# Patient Record
Sex: Male | Born: 1935
Health system: Southern US, Community
[De-identification: ages and names within clinical notes are randomized; demographics above are authoritative.]

## PROBLEM LIST (undated history)

## (undated) DIAGNOSIS — K5641 Fecal impaction: Secondary | ICD-10-CM

## (undated) DIAGNOSIS — I10 Essential (primary) hypertension: Secondary | ICD-10-CM

## (undated) DIAGNOSIS — N400 Enlarged prostate without lower urinary tract symptoms: Secondary | ICD-10-CM

## (undated) DIAGNOSIS — M1990 Unspecified osteoarthritis, unspecified site: Secondary | ICD-10-CM

## (undated) DIAGNOSIS — E039 Hypothyroidism, unspecified: Secondary | ICD-10-CM

## (undated) DIAGNOSIS — K219 Gastro-esophageal reflux disease without esophagitis: Secondary | ICD-10-CM

## (undated) DIAGNOSIS — A09 Infectious gastroenteritis and colitis, unspecified: Secondary | ICD-10-CM

## (undated) DIAGNOSIS — R42 Dizziness and giddiness: Secondary | ICD-10-CM

## (undated) DIAGNOSIS — I951 Orthostatic hypotension: Secondary | ICD-10-CM

## (undated) DIAGNOSIS — M79606 Pain in leg, unspecified: Secondary | ICD-10-CM

## (undated) DIAGNOSIS — I498 Other specified cardiac arrhythmias: Secondary | ICD-10-CM

## (undated) DIAGNOSIS — D649 Anemia, unspecified: Secondary | ICD-10-CM

## (undated) DIAGNOSIS — E559 Vitamin D deficiency, unspecified: Secondary | ICD-10-CM

## (undated) DIAGNOSIS — R05 Cough: Principal | ICD-10-CM

## (undated) DIAGNOSIS — M545 Low back pain: Secondary | ICD-10-CM

## (undated) DIAGNOSIS — M2550 Pain in unspecified joint: Secondary | ICD-10-CM

## (undated) DIAGNOSIS — E785 Hyperlipidemia, unspecified: Secondary | ICD-10-CM

## (undated) DIAGNOSIS — IMO0002 Reserved for concepts with insufficient information to code with codable children: Secondary | ICD-10-CM

## (undated) DIAGNOSIS — F411 Generalized anxiety disorder: Secondary | ICD-10-CM

## (undated) DIAGNOSIS — K279 Peptic ulcer, site unspecified, unspecified as acute or chronic, without hemorrhage or perforation: Secondary | ICD-10-CM

## (undated) DIAGNOSIS — M7500 Adhesive capsulitis of unspecified shoulder: Secondary | ICD-10-CM

## (undated) DIAGNOSIS — H9190 Unspecified hearing loss, unspecified ear: Secondary | ICD-10-CM

## (undated) DIAGNOSIS — R609 Edema, unspecified: Secondary | ICD-10-CM

## (undated) DIAGNOSIS — W19XXXA Unspecified fall, initial encounter: Secondary | ICD-10-CM

## (undated) DIAGNOSIS — R350 Frequency of micturition: Secondary | ICD-10-CM

## (undated) DIAGNOSIS — K449 Diaphragmatic hernia without obstruction or gangrene: Secondary | ICD-10-CM

## (undated) DIAGNOSIS — I509 Heart failure, unspecified: Secondary | ICD-10-CM

## (undated) DIAGNOSIS — E871 Hypo-osmolality and hyponatremia: Secondary | ICD-10-CM

## (undated) DIAGNOSIS — N179 Acute kidney failure, unspecified: Secondary | ICD-10-CM

## (undated) DIAGNOSIS — M719 Bursopathy, unspecified: Secondary | ICD-10-CM

## (undated) DIAGNOSIS — G4700 Insomnia, unspecified: Secondary | ICD-10-CM

## (undated) DIAGNOSIS — I714 Abdominal aortic aneurysm, without rupture, unspecified: Secondary | ICD-10-CM

## (undated) DIAGNOSIS — M25559 Pain in unspecified hip: Secondary | ICD-10-CM

## (undated) DIAGNOSIS — R221 Localized swelling, mass and lump, neck: Secondary | ICD-10-CM

## (undated) DIAGNOSIS — R269 Unspecified abnormalities of gait and mobility: Secondary | ICD-10-CM

## (undated) DIAGNOSIS — M67919 Unspecified disorder of synovium and tendon, unspecified shoulder: Secondary | ICD-10-CM

## (undated) DIAGNOSIS — R220 Localized swelling, mass and lump, head: Secondary | ICD-10-CM

## (undated) DIAGNOSIS — N289 Disorder of kidney and ureter, unspecified: Secondary | ICD-10-CM

## (undated) HISTORY — DX: Unspecified osteoarthritis, unspecified site: M19.90

## (undated) HISTORY — DX: Hypo-osmolality and hyponatremia: E87.1

## (undated) HISTORY — DX: Benign prostatic hyperplasia without lower urinary tract symptoms: N40.0

## (undated) HISTORY — DX: Unspecified hearing loss, unspecified ear: H91.90

## (undated) HISTORY — DX: Reserved for concepts with insufficient information to code with codable children: IMO0002

## (undated) HISTORY — DX: Bursopathy, unspecified: M71.9

## (undated) HISTORY — DX: Vitamin D deficiency, unspecified: E55.9

## (undated) HISTORY — DX: Heart failure, unspecified: I50.9

## (undated) HISTORY — DX: Essential (primary) hypertension: I10

## (undated) HISTORY — DX: Localized swelling, mass and lump, head: R22.0

## (undated) HISTORY — DX: Gastro-esophageal reflux disease without esophagitis: K21.9

## (undated) HISTORY — DX: Abdominal aortic aneurysm, without rupture, unspecified: I71.40

## (undated) HISTORY — DX: Unspecified abnormalities of gait and mobility: R26.9

## (undated) HISTORY — DX: Unspecified disorder of synovium and tendon, unspecified shoulder: M67.919

## (undated) HISTORY — DX: Orthostatic hypotension: I95.1

## (undated) HISTORY — DX: Infectious gastroenteritis and colitis, unspecified: A09

## (undated) HISTORY — DX: Low back pain: M54.5

## (undated) HISTORY — DX: Diaphragmatic hernia without obstruction or gangrene: K44.9

## (undated) HISTORY — DX: Cough: R05

## (undated) HISTORY — DX: Hypercalcemia: E83.52

## (undated) HISTORY — PX: CATARACT EXTRACTION: SUR2

## (undated) HISTORY — DX: Localized swelling, mass and lump, neck: R22.1

## (undated) HISTORY — DX: Insomnia, unspecified: G47.00

## (undated) HISTORY — DX: Disorder of kidney and ureter, unspecified: N28.9

## (undated) HISTORY — DX: Pain in unspecified hip: M25.559

## (undated) HISTORY — DX: Dizziness and giddiness: R42

## (undated) HISTORY — DX: Other specified cardiac arrhythmias: I49.8

## (undated) HISTORY — DX: Abdominal aortic aneurysm, without rupture: I71.4

## (undated) HISTORY — DX: Frequency of micturition: R35.0

## (undated) HISTORY — DX: Pain in unspecified joint: M25.50

## (undated) HISTORY — DX: Fecal impaction: K56.41

## (undated) HISTORY — DX: Hyperlipidemia, unspecified: E78.5

## (undated) HISTORY — DX: Pain in leg, unspecified: M79.606

## (undated) HISTORY — DX: Acute kidney failure, unspecified: N17.9

## (undated) HISTORY — DX: Unspecified fall, initial encounter: W19.XXXA

## (undated) HISTORY — DX: Peptic ulcer, site unspecified, unspecified as acute or chronic, without hemorrhage or perforation: K27.9

## (undated) HISTORY — DX: Edema, unspecified: R60.9

## (undated) HISTORY — DX: Adhesive capsulitis of unspecified shoulder: M75.00

## (undated) HISTORY — DX: Generalized anxiety disorder: F41.1

## (undated) HISTORY — DX: Anemia, unspecified: D64.9

## (undated) HISTORY — PX: APPENDECTOMY: SHX54

## (undated) HISTORY — DX: Hypothyroidism, unspecified: E03.9

---

## 1998-03-10 HISTORY — PX: HEMORRHOID SURGERY: SHX153

## 1998-04-08 ENCOUNTER — Encounter: Payer: Self-pay | Admitting: Emergency Medicine

## 1998-04-08 ENCOUNTER — Inpatient Hospital Stay (HOSPITAL_COMMUNITY): Admission: EM | Admit: 1998-04-08 | Discharge: 1998-04-10 | Payer: Self-pay | Admitting: Emergency Medicine

## 1998-04-09 ENCOUNTER — Encounter: Payer: Self-pay | Admitting: Cardiology

## 1998-10-04 ENCOUNTER — Emergency Department (HOSPITAL_COMMUNITY): Admission: EM | Admit: 1998-10-04 | Discharge: 1998-10-04 | Payer: Self-pay | Admitting: Emergency Medicine

## 1998-10-04 ENCOUNTER — Encounter: Payer: Self-pay | Admitting: Emergency Medicine

## 1999-07-15 ENCOUNTER — Ambulatory Visit (HOSPITAL_COMMUNITY): Admission: RE | Admit: 1999-07-15 | Discharge: 1999-07-15 | Payer: Self-pay

## 2000-05-07 ENCOUNTER — Encounter: Admission: RE | Admit: 2000-05-07 | Discharge: 2000-05-07 | Payer: Self-pay

## 2000-05-12 ENCOUNTER — Ambulatory Visit (HOSPITAL_BASED_OUTPATIENT_CLINIC_OR_DEPARTMENT_OTHER): Admission: RE | Admit: 2000-05-12 | Discharge: 2000-05-13 | Payer: Self-pay

## 2000-06-16 ENCOUNTER — Ambulatory Visit (HOSPITAL_COMMUNITY): Admission: RE | Admit: 2000-06-16 | Discharge: 2000-06-16 | Payer: Self-pay | Admitting: Internal Medicine

## 2003-03-11 HISTORY — PX: INGUINAL HERNIA REPAIR: SHX194

## 2003-07-03 ENCOUNTER — Emergency Department (HOSPITAL_COMMUNITY): Admission: EM | Admit: 2003-07-03 | Discharge: 2003-07-03 | Payer: Self-pay | Admitting: Emergency Medicine

## 2004-10-01 ENCOUNTER — Ambulatory Visit (HOSPITAL_COMMUNITY): Admission: RE | Admit: 2004-10-01 | Discharge: 2004-10-01 | Payer: Self-pay

## 2005-07-03 ENCOUNTER — Emergency Department (HOSPITAL_COMMUNITY): Admission: EM | Admit: 2005-07-03 | Discharge: 2005-07-03 | Payer: Self-pay | Admitting: Emergency Medicine

## 2005-08-21 ENCOUNTER — Emergency Department (HOSPITAL_COMMUNITY): Admission: EM | Admit: 2005-08-21 | Discharge: 2005-08-21 | Payer: Self-pay | Admitting: Emergency Medicine

## 2006-09-24 ENCOUNTER — Encounter: Payer: Self-pay | Admitting: Nurse Practitioner

## 2008-05-01 ENCOUNTER — Encounter: Payer: Self-pay | Admitting: Nurse Practitioner

## 2009-01-17 ENCOUNTER — Encounter: Payer: Self-pay | Admitting: Nurse Practitioner

## 2009-01-24 ENCOUNTER — Encounter: Payer: Self-pay | Admitting: Nurse Practitioner

## 2009-01-26 ENCOUNTER — Emergency Department (HOSPITAL_COMMUNITY): Admission: EM | Admit: 2009-01-26 | Discharge: 2009-01-26 | Payer: Self-pay | Admitting: Emergency Medicine

## 2009-01-29 ENCOUNTER — Telehealth (INDEPENDENT_AMBULATORY_CARE_PROVIDER_SITE_OTHER): Payer: Self-pay | Admitting: *Deleted

## 2009-01-30 ENCOUNTER — Telehealth (INDEPENDENT_AMBULATORY_CARE_PROVIDER_SITE_OTHER): Payer: Self-pay | Admitting: *Deleted

## 2009-01-30 ENCOUNTER — Ambulatory Visit: Payer: Self-pay | Admitting: Gastroenterology

## 2009-01-30 DIAGNOSIS — N183 Chronic kidney disease, stage 3 (moderate): Secondary | ICD-10-CM

## 2009-01-30 DIAGNOSIS — D509 Iron deficiency anemia, unspecified: Secondary | ICD-10-CM

## 2009-01-30 DIAGNOSIS — I10 Essential (primary) hypertension: Secondary | ICD-10-CM

## 2009-01-30 DIAGNOSIS — R42 Dizziness and giddiness: Secondary | ICD-10-CM

## 2009-01-30 DIAGNOSIS — E785 Hyperlipidemia, unspecified: Secondary | ICD-10-CM

## 2009-02-07 ENCOUNTER — Ambulatory Visit: Payer: Self-pay | Admitting: Gastroenterology

## 2009-02-07 LAB — CONVERTED CEMR LAB: UREASE: POSITIVE

## 2009-02-15 ENCOUNTER — Telehealth: Payer: Self-pay | Admitting: Gastroenterology

## 2009-02-16 ENCOUNTER — Encounter: Payer: Self-pay | Admitting: Gastroenterology

## 2009-02-16 ENCOUNTER — Telehealth: Payer: Self-pay | Admitting: Gastroenterology

## 2009-03-16 ENCOUNTER — Ambulatory Visit: Payer: Self-pay | Admitting: Gastroenterology

## 2009-03-16 DIAGNOSIS — D500 Iron deficiency anemia secondary to blood loss (chronic): Secondary | ICD-10-CM | POA: Insufficient documentation

## 2009-03-16 DIAGNOSIS — K219 Gastro-esophageal reflux disease without esophagitis: Secondary | ICD-10-CM

## 2009-03-16 DIAGNOSIS — K5730 Diverticulosis of large intestine without perforation or abscess without bleeding: Secondary | ICD-10-CM | POA: Insufficient documentation

## 2009-03-16 DIAGNOSIS — K259 Gastric ulcer, unspecified as acute or chronic, without hemorrhage or perforation: Secondary | ICD-10-CM | POA: Insufficient documentation

## 2009-03-16 LAB — CONVERTED CEMR LAB
AST: 19 units/L (ref 0–37)
Albumin: 3.4 g/dL — ABNORMAL LOW (ref 3.5–5.2)
BUN: 36 mg/dL — ABNORMAL HIGH (ref 6–23)
Basophils Relative: 0.6 % (ref 0.0–3.0)
Calcium: 8.6 mg/dL (ref 8.4–10.5)
Chloride: 110 meq/L (ref 96–112)
Eosinophils Relative: 6.2 % — ABNORMAL HIGH (ref 0.0–5.0)
Ferritin: 12.2 ng/mL — ABNORMAL LOW (ref 22.0–322.0)
Folate: >20 ng/mL
Hemoglobin: 7.9 g/dL — CL (ref 13.0–17.0)
Iron: 9 ug/dL — ABNORMAL LOW (ref 42–165)
Neutro Abs: 4.4 10*3/uL (ref 1.4–7.7)
Potassium: 5 meq/L (ref 3.5–5.1)
RBC: 4.08 M/uL — ABNORMAL LOW (ref 4.22–5.81)
Sodium: 140 meq/L (ref 135–145)
TSH: 4.16 microintl units/mL (ref 0.35–5.50)
Total Protein: 6.6 g/dL (ref 6.0–8.3)
Transferrin: 283.5 mg/dL (ref 212.0–360.0)
Vitamin B-12: 657 pg/mL (ref 211–911)
WBC: 7.9 10*3/uL (ref 4.5–10.5)

## 2009-03-21 ENCOUNTER — Encounter: Payer: Self-pay | Admitting: Gastroenterology

## 2009-04-19 ENCOUNTER — Ambulatory Visit: Payer: Self-pay | Admitting: Gastroenterology

## 2009-04-20 LAB — CONVERTED CEMR LAB
Basophils Relative: 0.7 % (ref 0.0–3.0)
Eosinophils Relative: 5.6 % — ABNORMAL HIGH (ref 0.0–5.0)
Ferritin: 20.9 ng/mL — ABNORMAL LOW (ref 22.0–322.0)
Folate: >20 ng/mL
HCT: 31.4 % — ABNORMAL LOW (ref 39.0–52.0)
Iron: 20 ug/dL — ABNORMAL LOW (ref 42–165)
Lymphs Abs: 1.5 10*3/uL (ref 0.7–4.0)
MCHC: 31.5 g/dL (ref 30.0–36.0)
MCV: 71.1 fL — ABNORMAL LOW (ref 78.0–100.0)
Monocytes Absolute: 1.1 10*3/uL — ABNORMAL HIGH (ref 0.1–1.0)
Neutro Abs: 3.6 10*3/uL (ref 1.4–7.7)
Platelets: 216 10*3/uL (ref 150.0–400.0)
Transferrin: 269.8 mg/dL (ref 212.0–360.0)
Vitamin B-12: 705 pg/mL (ref 211–911)

## 2009-05-17 ENCOUNTER — Ambulatory Visit: Payer: Self-pay | Admitting: Gastroenterology

## 2009-05-17 DIAGNOSIS — M1990 Unspecified osteoarthritis, unspecified site: Secondary | ICD-10-CM

## 2009-05-18 LAB — CONVERTED CEMR LAB
Basophils Absolute: 0 10*3/uL (ref 0.0–0.1)
Eosinophils Absolute: 0.3 10*3/uL (ref 0.0–0.7)
Eosinophils Relative: 3.6 % (ref 0.0–5.0)
Ferritin: 32 ng/mL (ref 22.0–322.0)
Folate: >20 ng/mL
Iron: 35 ug/dL — ABNORMAL LOW (ref 42–165)
Lymphocytes Relative: 22.1 % (ref 12.0–46.0)
Lymphs Abs: 1.6 10*3/uL (ref 0.7–4.0)
MCHC: 32.5 g/dL (ref 30.0–36.0)
MCV: 78.2 fL (ref 78.0–100.0)
Monocytes Relative: 12.7 % — ABNORMAL HIGH (ref 3.0–12.0)
Transferrin: 241.6 mg/dL (ref 212.0–360.0)
WBC: 7.1 10*3/uL (ref 4.5–10.5)

## 2009-06-06 ENCOUNTER — Encounter (INDEPENDENT_AMBULATORY_CARE_PROVIDER_SITE_OTHER): Payer: Self-pay | Admitting: *Deleted

## 2009-08-22 ENCOUNTER — Encounter: Payer: Self-pay | Admitting: Gastroenterology

## 2010-04-09 ENCOUNTER — Ambulatory Visit
Admission: RE | Admit: 2010-04-09 | Discharge: 2010-04-09 | Payer: Self-pay | Source: Home / Self Care | Attending: Vascular Surgery | Admitting: Vascular Surgery

## 2010-04-09 ENCOUNTER — Ambulatory Visit: Admit: 2010-04-09 | Payer: Self-pay | Admitting: Vascular Surgery

## 2010-04-09 NOTE — Assessment & Plan Note (Signed)
Summary: F/U EGD 1 mo/dfs   History of Present Illness Primary GI MD: Sheryn Bison MD FACP FAGA Primary Provider: [name], MD Requesting Provider: n/a Chief Complaint: 1 month f/u after EGD. Pt has increase in belching after procedure.  History of Present Illness:   This 77 year old Caucasian male presented with iron deficiency anemia and underwent endoscopy and colonoscopy one month ago. He had a large hiatal hernia with 2 gastric ulcerations and also rather severe left colon diverticulosis. The was treated with a Prevpac for 10 days for H. pylori positivity and severe gastritis. Is currently asymptomatic except for belching and burping, but he denies dysphagia or burning substernal chest pain, lower gastrointestinal or hepatobiliary complaints. He currently is taking Prilosec 20 mg a day and, aspirin 81 mg a day, and continues to use p.r.n. Aleve for left shoulder pain.   GI Review of Systems    Reports belching.      Denies abdominal pain, acid reflux, bloating, chest pain, dysphagia with liquids, dysphagia with solids, heartburn, loss of appetite, nausea, vomiting, vomiting blood, weight loss, and  weight gain.        Denies anal fissure, black tarry stools, change in bowel habit, constipation, diarrhea, diverticulosis, fecal incontinence, heme positive stool, hemorrhoids, irritable bowel syndrome, jaundice, light color stool, liver problems, rectal bleeding, and  rectal pain.    Current Medications (verified): 1)  Lipitor 20 Mg Tabs (Atorvastatin Calcium) .... Take One By Mouth Once Daily 2)  Meclizine Hcl 25 Mg Tabs (Meclizine Hcl) .... Take One By Mouth Three Times A Day As Needed 3)  Aspirin 81 Mg Tbec (Aspirin) .... Take One By Mouth Once Daily 4)  Senior Multivitamin Plus  Tabs (Multiple Vitamins-Minerals) .... Take One By Mouth Once Daily 5)  Vitamin D 2000 Unit Tabs (Cholecalciferol) .... Take Three Tabs By Mouth Once Daily 6)  Flax Seed Oil 1000 Mg Caps (Flaxseed  (Linseed)) .... Take Three Tabs By Mouth Once Daily 7)  Sea-Omega 30 1200 Mg Caps (Omega-3 Fatty Acids) .... Take Three Tabs By Mouth Once Daily 8)  Aleve 220 Mg Tabs (Naproxen Sodium) .... Take Two By Mouth As Needed 9)  Pain Med Otc [facility] .... Take By Mouth As Needed 10)  Prilosec Otc 20 Mg Tbec (Omeprazole Magnesium) .... Tale 1 Capsule 30 Minutes Before Breakfast 11)  Aciphex 20 Mg  Tbec (Rabeprazole Sodium) .... Take 1 Each Day 30 Minutes Before Meals  Allergies (verified): 1)  ! * Indorin  Past History:  Past medical, surgical, family and social histories (including risk factors) reviewed for relevance to current acute and chronic problems.  Past Medical History: Hypertension Dyslipidemia BPH Vertigo GERD hearing loss right ear Arthritis H.Pylori  Past Surgical History: Reviewed history from 01/30/2009 and no changes required. Appendectomy Hemorrhoidectomy cardiac cath  left inguinal hernia repair  Family History: Reviewed history from 01/30/2009 and no changes required. No FH of Colon Cancer Family History of Heart Disease: brother Alzheimer's  Social History: Reviewed history from 01/30/2009 and no changes required. Occupation: semi retired [employer] Patient has never smoked.  Alcohol Use - no Daily Caffeine Use 2 per day Illicit Drug Use - no  Review of Systems       The patient complains of arthritis/joint pain.  The patient denies allergy/sinus, anemia, anxiety-new, back pain, blood in urine, breast changes/lumps, change in vision, confusion, cough, coughing up blood, depression-new, fainting, fatigue, fever, headaches-new, hearing problems, heart murmur, heart rhythm changes, itching, menstrual pain, muscle pains/cramps, night  sweats, nosebleeds, pregnancy symptoms, shortness of breath, skin rash, sleeping problems, sore throat, swelling of feet/legs, swollen lymph glands, thirst - excessive , urination - excessive ,  urination changes/pain, urine leakage, vision changes, and voice change.         I cannot see where he has had previous CVA or cardiovascular events but he apparently is on a 81 mg of aspirin tablets a day for cardiovascular prophylaxis.  Vital Signs:  Patient profile:   77 year old male Height:      70 inches Weight:      183 pounds Pulse rate:   66 / minute Pulse rhythm:   regular BP sitting:   120 / 70  (right arm) Cuff size:   regular  Vitals Entered By: Christie Nottingham CMA Duncan Dull) (March 16, 2009 9:17 AM)  Physical Exam  General:  Well developed, well nourished, no acute distress.he That appears very pale without stigmata of chronic liver disease. Head:  Normocephalic and atraumatic. Eyes:  PERRLA, no icterus. Lungs:  Clear throughout to auscultation. Heart:  heart rate is regular with occasional ectopic beats and no significant murmurs gallops or rubs are noted. Abdomen:  Soft, nontender and nondistended. No masses, hepatosplenomegaly or hernias noted. Normal bowel sounds. Extremities:  No clubbing, cyanosis, edema or deformities noted. Neurologic:  Alert and  oriented x4;  grossly normal neurologically. Psych:  Alert and cooperative. Normal mood and affect.   Impression & Recommendations:  Problem # 1:  ULCER-GASTRIC (ICD-531.9) Assessment Improved He had 2 Cameron ulcerations and a large hiatal hernia which I feel is the site of his chronic GI blood loss. He continues to use NSAIDs and is on daily aspirin and needs stronger acid suppression. I have stopped the Prilosec and we'll place him on AcipHex 20 mg a day. We will repeat his CBC and anemia profile, and also place him on daily Tandem for iron replacement. I will see him back in one month's time and consider repeat endoscopic exam depending on his clinical course and workup. I've asked him to avoid NSAIDs and to use p.r.n. Tylenol for shoulder pain. It sounds like he has a rotator cuff tear and may need orthopedic  procedure for referral per Dr. Oneta Rack.  Problem # 2:  GERD (ICD-530.81) Assessment: Improved  Problem # 3:  DIVERTICULOSIS-COLON (ICD-562.10) Assessment: Unchanged Continue high-fiber diet as tolerated.  Problem # 4:  ANEMIA DUE TO CHRONIC BLOOD LOSS (ICD-280.0) Assessment: Unchanged Start iron replacement therapy and followup labs ordered  Other Orders: TLB-CBC Platelet - w/Differential (85025-CBCD) TLB-BMP (Basic Metabolic Panel-BMET) (80048-METABOL) TLB-Hepatic/Liver Function Pnl (80076-HEPATIC) TLB-TSH (Thyroid Stimulating Hormone) (84443-TSH) TLB-B12, Serum-Total ONLY (57846-N62) TLB-Ferritin (82728-FER) TLB-Folic Acid (Folate) (82746-FOL) TLB-IBC Pnl (Iron/FE;Transferrin) (83550-IBC)  Patient Instructions: 1)  Copy sent to : Dr. [name] 2)  Please continue current medications.  3)  Please schedule a follow-up appointment in 4 to 6 weeks. 4)  Labs Pending  5)  Stop Prilosec and begin Aciphex 20 mg once daily. 6)  Begin Tandem daily. 7)  The medication list was reviewed and reconciled.  All changed / newly prescribed medications were explained.  A complete medication list was provided to the patient / caregiver. Prescriptions: ACIPHEX 20 MG  TBEC (RABEPRAZOLE SODIUM) Take 1 each day 30 minutes before meals  #30 x 6   Entered by:   Ashok Cordia RN   Authorized by:   Mardella Layman MD [facility]   Signed by:   Ashok Cordia RN on 03/16/2009   Method  used:   Electronically to        [facility] [phone]* (retail)       [address]       [place], [place]  [address]       Ph: [phone]       Fax: [phone]   RxID:   [id number] TANDEM 162-115.2 MG CAPS (FERROUS FUM-IRON POLYSACCH) 1 by mouth qd  #30 x 6   Entered by:   Ashok Cordia RN   Authorized by:   Mardella Layman MD [facility]   Signed by:   Ashok Cordia RN on 03/16/2009   Method used:   Electronically to        [facility]. [phone]* (retail)       [address]       [place], [place]  [address]       Ph: [phone]       Fax: [phone]   RxID:   [id number]

## 2010-04-09 NOTE — Assessment & Plan Note (Signed)
Summary: 72-MONTH F/U APPT...LSW.   History of Present Illness Primary GI MD: Sheryn Bison MD FACP FAGA Primary Provider: [name], MD Requesting Provider: n/a Chief Complaint: 1 month f/u for anemia. Pt still taking iron bid and states he is doing very well. History of Present Illness:   This 77 year old Caucasian male has a large hiatal hernia with ulcerations and chronic GI blood loss. He is currently doing well daily omeprazole and iron replacement therapy with last hemoglobin up to 10.0. His only complaint today is of arthritis type pain in his shoulders, elbows, knees, and generalized girdle weakness. He has not in the past been diagnosed as having polymyalgia. He is currently taking p.r.n. Tylenol. He denies gastrointestinal symptoms at this time or any cardiovascular or pulmonary symptomatology.   GI Review of Systems      Denies abdominal pain, acid reflux, belching, bloating, chest pain, dysphagia with liquids, dysphagia with solids, heartburn, loss of appetite, nausea, vomiting, vomiting blood, weight loss, and  weight gain.        Denies anal fissure, black tarry stools, change in bowel habit, constipation, diarrhea, diverticulosis, fecal incontinence, heme positive stool, hemorrhoids, irritable bowel syndrome, jaundice, light color stool, liver problems, rectal bleeding, and  rectal pain.    Current Medications (verified): 1)  Simvastatin 40 Mg Tabs (Simvastatin) .... Take 1 Tablet By Mouth Once A Day 2)  Meclizine Hcl 25 Mg Tabs (Meclizine Hcl) .... Take One By Mouth Three Times A Day As Needed 3)  Aspirin 81 Mg Tbec (Aspirin) .... Take One By Mouth Once Daily 4)  Senior Multivitamin Plus  Tabs (Multiple Vitamins-Minerals) .... Take One By Mouth Once Daily 5)  Vitamin D 2000 Unit Tabs (Cholecalciferol) .... Take Three Tabs By Mouth Once Daily 6)  Flax Seed Oil 1000 Mg Caps (Flaxseed (Linseed)) .... Take Three Tabs By Mouth Once Daily 7)  Sea-Omega 30 1200 Mg Caps  (Omega-3 Fatty Acids) .... Take Three Tabs By Mouth Once Daily 8)  Tandem 162-115.2 Mg Caps (Ferrous Fum-Iron Polysacch) .Marland Kitchen.. 1 By Mouth Qd 9)  Omeprazole 20 Mg Cpdr (Omeprazole) .... Take 1 Tablet Bid  Allergies (verified): No Known Drug Allergies  Past History:  Past medical, surgical, family and social histories (including risk factors) reviewed for relevance to current acute and chronic problems.  Past Medical History: Reviewed history from 03/16/2009 and no changes required. Hypertension Dyslipidemia BPH Vertigo GERD hearing loss right ear Arthritis H.Pylori  Past Surgical History: Reviewed history from 04/19/2009 and no changes required. Appendectomy Hemorrhoidectomy cardiac cath  left inguinal hernia repair tonsillectomy  Family History: Reviewed history from 01/30/2009 and no changes required. No FH of Colon Cancer Family History of Heart Disease: brother Alzheimer's  Social History: Reviewed history from 01/30/2009 and no changes required. Occupation: semi retired [employer] Patient has never smoked.  Alcohol Use - no Daily Caffeine Use 2 per day Illicit Drug Use - no  Review of Systems       The patient complains of arthritis/joint pain.  The patient denies allergy/sinus, anemia, anxiety-new, back pain, blood in urine, breast changes/lumps, change in vision, confusion, cough, coughing up blood, depression-new, fainting, fatigue, fever, headaches-new, hearing problems, heart murmur, heart rhythm changes, itching, menstrual pain, muscle pains/cramps, night sweats, nosebleeds, pregnancy symptoms, shortness of breath, skin rash, sleeping problems, sore throat, swelling of feet/legs, swollen lymph glands, thirst - excessive , urination - excessive , urination changes/pain, urine leakage, vision changes, and voice change.    Vital Signs:  Patient profile:  77 year old male Height:      70 inches Weight:      182 pounds Pulse rate:    60 / minute Pulse rhythm:   irregular BP sitting:   130 / 70  (right arm) Cuff size:   regular  Vitals Entered By: Christie Nottingham CMA Duncan Dull) (May 17, 2009 9:19 AM)  Physical Exam  General:  Well developed, well nourished, no acute distress.He still appears very pale but in no acute distress. Head:  Normocephalic and atraumatic. Eyes:  PERRLA, no icterus.exam deferred to patient's ophthalmologist.  exam deferred to patient's ophthalmologist.   Msk:  Symmetrical with no gross deformities. Normal posture.No swollen joints or hot joints noted. He does have bony thickening of his large joints without effusions. There is some decreased muscular strength in his shoulder area. Psych:  Alert and cooperative. Normal mood and affect.   Impression & Recommendations:  Problem # 1:  ANEMIA DUE TO CHRONIC BLOOD LOSS (ICD-280.0) Assessment Improved Continue Daily tandem and omeprazole. We'll repeat his CBC and iron levels with office followup in 2 months time.  Orders: TLB-CBC Platelet - w/Differential (85025-CBCD) TLB-Sedimentation Rate (ESR) (85652-ESR) TLB-B12, Serum-Total ONLY (56213-Y86) TLB-Ferritin (82728-FER) TLB-Folic Acid (Folate) (82746-FOL) TLB-IBC Pnl (Iron/FE;Transferrin) (83550-IBC)  Problem # 2:  OSTEOARTHROS UNSPEC WHETHER GEN/LOC UNSPEC SITE (ICD-715.90) Assessment: Deteriorated Consider a diagnosis of polymyalgia rheumatica. Sedimentation rate is been ordered and he may need a low dose of prednisone therapy. In the interim, I placed him on Celebrex 200 mg a day which he should be able to tolerate without GI toxicity. We will call him with the results of his blood work and further plans. Also will send this note to his primary care physician Dr. Oneta Rack.  Other Orders: TLB-TSH (Thyroid Stimulating Hormone) [phone])  Patient Instructions: 1)  Go to the basement for labs. 2)  Start Celebrex daily. 3)  Please schedule a follow-up appointment in 2 months.  4)  The  medication list was reviewed and reconciled.  All changed / newly prescribed medications were explained.  A complete medication list was provided to the patient / caregiver. 5)  Please continue current medications.  6)  Copy sent to : Dr. [name]... 7)  CBC and iron levels before next clinic visit 8)  Continue to avoid regular NSAIDs and aspirin.  Appended Document: 69-MONTH F/U APPT...LSW.    Clinical Lists Changes  Medications: Added new medication of CELEBREX 200 MG CAPS (CELECOXIB) 1 by mouth daily with food - Signed Rx of CELEBREX 200 MG CAPS (CELECOXIB) 1 by mouth daily with food;  #30 x 3;  Signed;  Entered by: Ashok Cordia RN;  Authorized by: Mardella Layman MD [facility];  Method used: Electronically to [facility]. [phone]*, [address], [place], [place], [place]  [address], Ph: [phone], Fax: [phone]    Prescriptions: CELEBREX 200 MG CAPS (CELECOXIB) 1 by mouth daily with food  #30 x 3   Entered by:   Ashok Cordia RN   Authorized by:   Mardella Layman MD [facility]   Signed by:   Ashok Cordia RN on 05/17/2009   Method used:   Electronically to        [facility]. [phone]* (retail)       [address]       [place], [place]  [address]       Ph: [phone]       Fax: [phone]  RxID:   [id number]

## 2010-04-09 NOTE — Medication Information (Signed)
Summary: Approval/medco  Approval/medco   Imported By: Lester Social Circle 08/27/2009 10:00:58  _____________________________________________________________________  External Attachment:    Type:   Image     Comment:   External Document

## 2010-04-09 NOTE — Medication Information (Signed)
Summary: Prior [employer] Approved for Aciphex/[facility]  Prior [employer] Approved for Aciphex/[facility]   Imported By: Sherian Rein 03/27/2009 09:47:23  _____________________________________________________________________  External Attachment:    Type:   Image     Comment:   External Document

## 2010-04-09 NOTE — Assessment & Plan Note (Signed)
Summary: F/U APPT...LSW.   History of Present Illness Visit Type: Follow-up Visit Primary GI MD: Sheryn Bison MD FACP FAGA Primary Provider: [name], MD Requesting Provider: n/a Chief Complaint: Patient is here for f/u of his iron deficiency anemia. He is fatigued at times but denies any other GI symptoms at this time. History of Present Illness:   He denies GI complaints. He is on daily PPI therapy and oral iron. He continues with fatigue but denies a specific cardiovascular or pulmonary complaints otherwise. I reviewed his medications with him today.   GI Review of Systems      Denies abdominal pain, acid reflux, belching, bloating, chest pain, dysphagia with liquids, dysphagia with solids, heartburn, loss of appetite, nausea, vomiting, vomiting blood, weight loss, and  weight gain.        Denies anal fissure, black tarry stools, change in bowel habit, constipation, diarrhea, diverticulosis, fecal incontinence, heme positive stool, hemorrhoids, irritable bowel syndrome, jaundice, light color stool, liver problems, rectal bleeding, and  rectal pain.    Current Medications (verified): 1)  Simvastatin 40 Mg Tabs (Simvastatin) .... Take 1 Tablet By Mouth Once A Day 2)  Meclizine Hcl 25 Mg Tabs (Meclizine Hcl) .... Take One By Mouth Three Times A Day As Needed 3)  Aspirin 81 Mg Tbec (Aspirin) .... Take One By Mouth Once Daily 4)  Senior Multivitamin Plus  Tabs (Multiple Vitamins-Minerals) .... Take One By Mouth Once Daily 5)  Vitamin D 2000 Unit Tabs (Cholecalciferol) .... Take Three Tabs By Mouth Once Daily 6)  Flax Seed Oil 1000 Mg Caps (Flaxseed (Linseed)) .... Take Three Tabs By Mouth Once Daily 7)  Sea-Omega 30 1200 Mg Caps (Omega-3 Fatty Acids) .... Take Three Tabs By Mouth Once Daily 8)  Aleve 220 Mg Tabs (Naproxen Sodium) .... Take Two By Mouth As Needed 9)  Tandem 162-115.2 Mg Caps (Ferrous Fum-Iron Polysacch) .Marland Kitchen.. 1 By Mouth Qd 10)  Aciphex 20 Mg  Tbec (Rabeprazole  Sodium) .... Take 1 Each Day 30 Minutes Before A Meal 11)  Omeprazole 20 Mg Cpdr (Omeprazole) .... Take 1 Tablet Before Breakfast  Allergies (verified): No Known Drug Allergies  Past History:  Past medical, surgical, family and social histories (including risk factors) reviewed for relevance to current acute and chronic problems.  Past Medical History: Reviewed history from 03/16/2009 and no changes required. Hypertension Dyslipidemia BPH Vertigo GERD hearing loss right ear Arthritis H.Pylori  Past Surgical History: Appendectomy Hemorrhoidectomy cardiac cath  left inguinal hernia repair tonsillectomy  Family History: Reviewed history from 01/30/2009 and no changes required. No FH of Colon Cancer Family History of Heart Disease: brother Alzheimer's  Social History: Reviewed history from 01/30/2009 and no changes required. Occupation: semi retired [employer] Patient has never smoked.  Alcohol Use - no Daily Caffeine Use 2 per day Illicit Drug Use - no  Vital Signs:  Patient profile:   77 year old male Height:      70 inches Weight:      182 pounds Pulse rate:   56 / minute Pulse rhythm:   irregular BP sitting:   90 / 50  (right arm)  Vitals Entered By: Hortense Ramal CMA Duncan Dull) (April 19, 2009 9:26 AM)  Physical Exam  General:  Well developed, well nourished, no acute distress.Pale appearing but in no acute distress. Head:  Normocephalic and atraumatic. Eyes:  PERRLA, no icterus.exam deferred to patient's ophthalmologist.   Lungs:  Clear throughout to auscultation. Heart:  Regular rate and rhythm; no  murmurs, rubs,  or bruits.Occasional ectopic beat noted but he does not appear to be in atrial fib. Abdomen:  Soft, nontender and nondistended. No masses, hepatosplenomegaly or hernias noted. Normal bowel sounds. Extremities:  No clubbing, cyanosis, edema or deformities noted. Psych:  Alert and cooperative. Normal mood and  affect.   Impression & Recommendations:  Problem # 1:  ANEMIA DUE TO CHRONIC BLOOD LOSS (ICD-280.0) Assessment Improved  Continue oral iron therapy and check CBC and iron levels. He may need transfusion if his hemoglobin does not adequately improve. I have increased his Prilosec to 20 mg twice a day versus a stronger prescription PPI medication. I'll see him again in one month's time or p.r.n. as needed. He is to stop NSAIDs in the form of Aleve and use p.r.n. Tylenol. It is of note that the patient on aspirin 81 mg a day. There apparently is some problems communication-wise with this patient and his grandson was not with him today for instruction review.  Orders: TLB-CBC Platelet - w/Differential (85025-CBCD) TLB-B12, Serum-Total ONLY (06237-S28) TLB-Ferritin (82728-FER) TLB-Folic Acid (Folate) (82746-FOL) TLB-IBC Pnl (Iron/FE;Transferrin) (83550-IBC)  Problem # 2:  GERD (ICD-530.81) Assessment: Improved Continue Prilosec 20 mg twice a day or AcipHex 20 mg q.a.m. I do not think it Prilosec 20 mg a day is adequate for this patient with chronic GI blood loss from a large hiatal hernia.  Problem # 3:  HYPERTENSION (ICD-401.9) Assessment: Improved blood pressure today is 90/50 he really is not on any blood pressure medication.  Patient Instructions: 1)  Prilosec 20 mg twice a day 30 minutes before breakfast and supper 2)  Continue iron therapy 3)  CBC and anemia profile ordered 4)  You may need transfusion 5)  Use p.r.n. Tylenol and not Aleve 6)  Please schedule a follow-up appointment in 1 month.  7)  Copy sent to :Dr. [name] 8)  The medication list was reviewed and reconciled.  All changed / newly prescribed medications were explained.  A complete medication list was provided to the patient / caregiver. Prescriptions: OMEPRAZOLE 20 MG CPDR (OMEPRAZOLE) Take 1 tablet bid  #60 x 11   Entered by:   Ashok Cordia RN   Authorized by:   Mardella Layman MD [facility]   Signed by:    Ashok Cordia RN on 04/19/2009   Method used:   Electronically to        [facility] [phone]* (retail)       [address]       [place], [place]  [address]       Ph: [phone]       Fax: [phone]   RxID:   [id number]

## 2010-04-09 NOTE — Letter (Signed)
Summary: Office Visit Letter  [facility]  [address] [place], [place] [address]   Phone: [phone]  Fax: [phone]      June 06, 2009 MRN: [id number]   Ricardo [name] [facility] [address] [place], [place]  [address]   Dear [name],   According to our records, it is time for you to schedule a follow-up office visit with [place].   At your convenience, please call [phone] (option #2)to schedule an office visit. If you have any questions, concerns, or feel that this letter is in error, we would appreciate your call.   Sincerely,  Vania Rea. Jarold Motto, M.D.  [facility] [phone]

## 2010-04-10 NOTE — Consult Note (Signed)
NEW PATIENT CONSULTATION  Ricardo [name], Ricardo [name] DOB:  [birth year]                                       04/09/2010 BJYNW#:[id number]  HISTORY OF PRESENT ILLNESS:  The patient is a 77 year old healthy individual who was referred by Dr. Lajoyce Corners for an infrarenal abdominal aortic aneurysm discovered recently on a lumbar spine film.  Patient has been experiencing chronic back and left hip problems and was evaluated by Dr. Lajoyce Corners.  His lumbar spine films suggested the patient had a 7-cm infrarenal aneurysm.  He was referred for evaluation.  The patient has no prior knowledge of the aneurysm.  CHRONIC MEDICAL PROBLEMS: 1. Hypertension. 2. Hyperlipidemia. 3. BPH. 4. GERD. 5. Chronic arthritis. 6. History of a chronic GI bleed, not active at the present time,     evaluated by Dr. Sheryn Bison.  SOCIAL HISTORY:  The patient is widowed, lives at friend's home, has 1 child, does not use tobacco or alcohol.  FAMILY HISTORY:  Negative for coronary artery disease, diabetes or stroke.  REVIEW OF SYSTEMS:  Positive for decreased hearing, wears a hearing aid, has some leg discomfort with ambulation, arthritis, left hip pain, joint pain, muscle pain but denies chest pain or dyspnea on exertion.  Does have reflux esophagitis and hiatal hernia.  All other systems are negative on complete review of systems.  PHYSICAL EXAMINATION:  VITAL SIGNS:  Blood pressure 150/76, heart rate 57, respirations 12. GENERAL:  He is an elderly male patient who is in no apparent distress. He is alert, oriented x3.  Appears younger than his stated age. HEENT:  Normal for age.  EOMs intact. LUNGS:  Clear to auscultation.  No rhonchi or wheezing. CARDIOVASCULAR:  Regular rhythm.  No murmurs.  Carotid pulses are 3+. No audible bruits. ABDOMEN:  Soft, nontender with 5- to 6-cm pulsatile mass. MUSCULOSKELETAL:  Free of major deformities. NEUROLOGIC:  Normal. SKIN:  Free of rashes. EXTREMITIES:  Lower  extremity exam reveals 3+ femoral, popliteal, and posterior tibial pulses palpable bilaterally.  Today I ordered a duplex scan of his abdominal aorta which I have reviewed and interpreted.  It appears that he has a 5.6-cm infrarenal abdominal aortic aneurysm.  I discussed these findings with him.  The aneurysm is large enough to treat but since he is 77 years old, although he is in excellent condition,  I do believe we will wait 6 months and see what happens to the size and also obtain a CT angiogram to see if he would be a candidate for aortic stent grafting.  He will return in 6 months with a CT angiogram to be performed at that time.  If the aneurysm enlarges and he continues to be in good health and he is a good candidate for aortic stent grafting, we would not rule out a procedure on this healthy gentleman who is quite healthy for his age.    Ricardo [name], M.D. Electronically Signed  JDL/MEDQ  D:  04/09/2010  T:  04/10/2010  Job:  [id number]  cc:   [name], M.D. Nadara Mustard, MD

## 2010-04-15 NOTE — Procedures (Unsigned)
DUPLEX ULTRASOUND OF ABDOMINAL AORTA  INDICATION:  Abdominal aortic aneurysm.  HISTORY: Diabetes:  No. Cardiac:  No. Hypertension:  No. Smoking:  No. Connective Tissue Disorder: Family History:  No. Previous Surgery:  No.  DUPLEX EXAM:         AP (cm)                   TRANSVERSE (cm) Proximal             3.0 cm                    2.9 cm Mid                  2.4 cm                    2.4 cm Distal               5.6 cm                    5.5 cm Right Iliac          1.4 cm                    1.8 cm Left Iliac           1.3 cm                    1.6 cm  PREVIOUS:  Date:  AP:  TRANSVERSE:  IMPRESSION:  Abdominal aortic aneurysm within the distal aorta with intraluminal thrombus and atherosclerosis.  The largest measurement today is 5.6 x 5.5 cm.  Bilateral iliacs appear ectatic with atherosclerosis.  ___________________________________________ [name], M.D.  OD/MEDQ  D:  04/09/2010  T:  04/09/2010  Job:  [id number]

## 2010-04-20 ENCOUNTER — Emergency Department (HOSPITAL_COMMUNITY): Payer: Medicare PPO

## 2010-04-20 ENCOUNTER — Inpatient Hospital Stay (HOSPITAL_COMMUNITY)
Admission: EM | Admit: 2010-04-20 | Discharge: 2010-04-25 | DRG: 392 | Disposition: A | Discharge status: Home / Self Care | Payer: Medicare PPO | Attending: Internal Medicine | Admitting: Internal Medicine

## 2010-04-20 DIAGNOSIS — N179 Acute kidney failure, unspecified: Secondary | ICD-10-CM | POA: Diagnosis present

## 2010-04-20 DIAGNOSIS — I714 Abdominal aortic aneurysm, without rupture, unspecified: Secondary | ICD-10-CM | POA: Diagnosis present

## 2010-04-20 DIAGNOSIS — R42 Dizziness and giddiness: Secondary | ICD-10-CM | POA: Diagnosis present

## 2010-04-20 DIAGNOSIS — E785 Hyperlipidemia, unspecified: Secondary | ICD-10-CM | POA: Diagnosis present

## 2010-04-20 DIAGNOSIS — E876 Hypokalemia: Secondary | ICD-10-CM | POA: Diagnosis present

## 2010-04-20 DIAGNOSIS — K5641 Fecal impaction: Secondary | ICD-10-CM | POA: Diagnosis present

## 2010-04-20 DIAGNOSIS — K449 Diaphragmatic hernia without obstruction or gangrene: Secondary | ICD-10-CM | POA: Diagnosis present

## 2010-04-20 DIAGNOSIS — A09 Infectious gastroenteritis and colitis, unspecified: Principal | ICD-10-CM | POA: Diagnosis present

## 2010-04-20 DIAGNOSIS — D649 Anemia, unspecified: Secondary | ICD-10-CM | POA: Diagnosis present

## 2010-04-20 DIAGNOSIS — E860 Dehydration: Secondary | ICD-10-CM | POA: Diagnosis present

## 2010-04-20 DIAGNOSIS — I10 Essential (primary) hypertension: Secondary | ICD-10-CM | POA: Diagnosis present

## 2010-04-20 LAB — HEPATIC FUNCTION PANEL
ALT: 19 U/L (ref 0–53)
AST: 29 U/L (ref 0–37)
Alkaline Phosphatase: 75 U/L (ref 39–117)
Bilirubin, Direct: 0.1 mg/dL (ref 0.0–0.3)
Indirect Bilirubin: 0.7 mg/dL (ref 0.3–0.9)
Total Bilirubin: 0.8 mg/dL (ref 0.3–1.2)
Total Protein: 6.6 g/dL (ref 6.0–8.3)

## 2010-04-20 LAB — DIFFERENTIAL
Basophils Absolute: 0 10*3/uL (ref 0.0–0.1)
Basophils Relative: 0 % (ref 0–1)
Monocytes Absolute: 0.8 10*3/uL (ref 0.1–1.0)
Monocytes Relative: 3 % (ref 3–12)
Neutrophils Relative %: 92 % — ABNORMAL HIGH (ref 43–77)

## 2010-04-20 LAB — PROCALCITONIN: Procalcitonin: <0.1 ng/mL

## 2010-04-20 LAB — POCT CARDIAC MARKERS
CKMB, poc: 1.4 ng/mL (ref 1.0–8.0)
Myoglobin, poc: 132 ng/mL (ref 12–200)
Troponin i, poc: <0.05 ng/mL (ref 0.00–0.09)

## 2010-04-20 LAB — BASIC METABOLIC PANEL
Calcium: 9.6 mg/dL (ref 8.4–10.5)
GFR calc Af Amer: 46 mL/min — ABNORMAL LOW (ref >60)
GFR calc non Af Amer: 38 mL/min — ABNORMAL LOW (ref >60)
Glucose, Bld: 150 mg/dL — ABNORMAL HIGH (ref 70–99)
Sodium: 134 mEq/L — ABNORMAL LOW (ref 135–145)

## 2010-04-20 LAB — CBC
HCT: 38.6 % — ABNORMAL LOW (ref 39.0–52.0)
Hemoglobin: 13 g/dL (ref 13.0–17.0)
MCH: 29.4 pg (ref 26.0–34.0)
MCHC: 33.7 g/dL (ref 30.0–36.0)
MCV: 87.3 fL (ref 78.0–100.0)
Platelets: 231 10*3/uL (ref 150–400)
RBC: 4.42 MIL/uL (ref 4.22–5.81)
RDW: 12.7 % (ref 11.5–15.5)
WBC: 25.7 10*3/uL — ABNORMAL HIGH (ref 4.0–10.5)

## 2010-04-20 LAB — URINALYSIS, ROUTINE W REFLEX MICROSCOPIC
Bilirubin Urine: NEGATIVE
Protein, ur: NEGATIVE mg/dL
Urine Glucose, Fasting: NEGATIVE mg/dL
Urobilinogen, UA: 1 mg/dL (ref 0.0–1.0)
pH: 5.5 (ref 5.0–8.0)

## 2010-04-20 LAB — CLOSTRIDIUM DIFFICILE BY PCR: Toxigenic C. Difficile by PCR: NEGATIVE

## 2010-04-20 LAB — CARDIAC PANEL(CRET KIN+CKTOT+MB+TROPI)
Relative Index: INVALID (ref 0.0–2.5)
Total CK: 32 U/L (ref 7–232)

## 2010-04-20 LAB — LIPASE, BLOOD: Lipase: 43 U/L (ref 11–59)

## 2010-04-20 MED ORDER — IOHEXOL 300 MG/ML  SOLN
80.0000 mL | Freq: Once | INTRAMUSCULAR | Status: AC | PRN
  Administered 2010-04-20: 80 mL via INTRAVENOUS

## 2010-04-21 LAB — CBC
Hemoglobin: 12.4 g/dL — ABNORMAL LOW (ref 13.0–17.0)
RDW: 13.1 % (ref 11.5–15.5)
WBC: 16.7 10*3/uL — ABNORMAL HIGH (ref 4.0–10.5)

## 2010-04-21 LAB — VITAMIN B12: Vitamin B-12: 690 pg/mL (ref 211–911)

## 2010-04-21 LAB — COMPREHENSIVE METABOLIC PANEL
ALT: 17 U/L (ref 0–53)
Albumin: 2.5 g/dL — ABNORMAL LOW (ref 3.5–5.2)
Alkaline Phosphatase: 43 U/L (ref 39–117)
CO2: 22 mEq/L (ref 19–32)
Chloride: 106 mEq/L (ref 96–112)
Glucose, Bld: 134 mg/dL — ABNORMAL HIGH (ref 70–99)
Potassium: 4.5 mEq/L (ref 3.5–5.1)
Sodium: 136 mEq/L (ref 135–145)
Total Bilirubin: 0.8 mg/dL (ref 0.3–1.2)
Total Protein: 5.2 g/dL — ABNORMAL LOW (ref 6.0–8.3)

## 2010-04-21 LAB — URINALYSIS, ROUTINE W REFLEX MICROSCOPIC
Bilirubin Urine: NEGATIVE
Ketones, ur: NEGATIVE mg/dL
Nitrite: NEGATIVE
Urobilinogen, UA: 0.2 mg/dL (ref 0.0–1.0)
pH: 5 (ref 5.0–8.0)

## 2010-04-21 LAB — HEMOCCULT GUIAC POC 1CARD (OFFICE): Fecal Occult Bld: POSITIVE

## 2010-04-21 LAB — IRON AND TIBC
Iron: 12 ug/dL — ABNORMAL LOW (ref 42–135)
Saturation Ratios: 7 % — ABNORMAL LOW (ref 20–55)
TIBC: 182 ug/dL — ABNORMAL LOW (ref 215–435)

## 2010-04-21 LAB — CARDIAC PANEL(CRET KIN+CKTOT+MB+TROPI): Total CK: 33 U/L (ref 7–232)

## 2010-04-21 LAB — URINE MICROSCOPIC-ADD ON

## 2010-04-21 LAB — SODIUM, URINE, RANDOM: Sodium, Ur: 76 mEq/L

## 2010-04-22 LAB — BASIC METABOLIC PANEL
CO2: 21 mEq/L (ref 19–32)
Calcium: 8 mg/dL — ABNORMAL LOW (ref 8.4–10.5)
GFR calc Af Amer: 54 mL/min — ABNORMAL LOW (ref >60)
Glucose, Bld: 91 mg/dL (ref 70–99)
Potassium: 3.6 mEq/L (ref 3.5–5.1)
Sodium: 138 mEq/L (ref 135–145)

## 2010-04-22 LAB — CBC
HCT: 31.9 % — ABNORMAL LOW (ref 39.0–52.0)
Hemoglobin: 10.8 g/dL — ABNORMAL LOW (ref 13.0–17.0)
MCHC: 33.9 g/dL (ref 30.0–36.0)
RBC: 3.69 MIL/uL — ABNORMAL LOW (ref 4.22–5.81)
WBC: 13.5 10*3/uL — ABNORMAL HIGH (ref 4.0–10.5)

## 2010-04-22 LAB — PHOSPHORUS: Phosphorus: 3.1 mg/dL (ref 2.3–4.6)

## 2010-04-23 LAB — OVA AND PARASITE EXAMINATION

## 2010-04-23 LAB — STOOL CULTURE

## 2010-04-24 LAB — CBC
HCT: 30.4 % — ABNORMAL LOW (ref 39.0–52.0)
MCHC: 34.5 g/dL (ref 30.0–36.0)
MCV: 85.2 fL (ref 78.0–100.0)
Platelets: 145 10*3/uL — ABNORMAL LOW (ref 150–400)
RDW: 12.8 % (ref 11.5–15.5)
WBC: 8.3 10*3/uL (ref 4.0–10.5)

## 2010-04-24 LAB — BASIC METABOLIC PANEL
BUN: 15 mg/dL (ref 6–23)
Calcium: 8.5 mg/dL (ref 8.4–10.5)
GFR calc non Af Amer: 54 mL/min — ABNORMAL LOW (ref >60)
Glucose, Bld: 88 mg/dL (ref 70–99)
Sodium: 137 mEq/L (ref 135–145)

## 2010-04-25 LAB — BASIC METABOLIC PANEL
BUN: 13 mg/dL (ref 6–23)
Creatinine, Ser: 1.4 mg/dL (ref 0.4–1.5)
GFR calc Af Amer: 57 mL/min — ABNORMAL LOW (ref >60)
GFR calc non Af Amer: 47 mL/min — ABNORMAL LOW (ref >60)

## 2010-04-26 LAB — CULTURE, BLOOD (ROUTINE X 2)
Culture  Setup Time: 201202111611
Culture: NO GROWTH

## 2010-04-29 NOTE — Discharge Summary (Signed)
Ricardo [name], Ricardo [name]             ACCOUNT NO.:  [id number]  MEDICAL RECORD NO.:  [id number]           PATIENT TYPE:  I  LOCATION:  1408                         FACILITY:  [facility]  PHYSICIAN:  Hillery Aldo, M.D.   DATE OF BIRTH:  [birth year]  DATE OF ADMISSION:  04/20/2010 DATE OF DISCHARGE:  04/25/2010                              DISCHARGE SUMMARY   PRIMARY CARE PHYSICIAN:  Ricardo [name], M.D.  DISCHARGE DIAGNOSES: 1. Infectious colitis. 2. Fecal impaction. 3. Hypertension. 4. History of abdominal aortic aneurysm. 5. Hyperlipidemia. 6. History of vertigo. 7. Hypokalemia. 8. Acute renal failure/acute kidney injury. 9. Normocytic anemia. 10.Moderate hiatal hernia.  DISCHARGE MEDICATIONS: 1. Cipro 500 mg p.o. b.i.d. for 7 more days. 2. Metronidazole 500 mg p.o. q.8 h. for 7 more days. 3. Alprazolam 0.5 mg, one half to one tablet p.o. nightly p.r.n.     sleep. 4. Aspirin 81 mg p.o. daily. 5. Clear Eyes 0.5% natural tears lubricant 2 drops OU daily p.r.n. eye     dryness. 6. Fish oil OTC 1 capsule p.o. daily. 7. Flaxseed oil OTC 1 tablet p.o. daily. 8. Hydrocodone/APAP 5/500 one tablet p.o. q.6 h. p.r.n. pain. 9. Lisinopril/HCTZ 20/12.5 one tablet p.o. daily with instructions to     hold for 3 more days. 10.Meclizine 25 mg p.o. t.i.d. p.r.n. vertigo. 11.Simvastatin 40 mg p.o. nightly. 12.Tramadol 50-100 mg p.o. q.i.d. p.r.n. pain. 13.Tylenol with Codeine No. 4 one tablet, 300/60, p.o. q.i.d. p.r.n.     arthritis pain.  CONSULTATIONS:  None.  BRIEF ADMISSION HPI:  The patient is a 77 year old male who presented to the hospital with a chief complaint of the sudden onset of abdominal pain accompanied by nausea and vomiting.  Upon initial presentation to the emergency department, the patient was hypotensive and required fluid volume resuscitation.  He subsequently was found to have evidence of colitis and referred to the hospitalist service for further evaluation and  treatment.  For full details, please see the dictated report done by Dr. Cleotis Lema.  PROCEDURES AND DIAGNOSTIC STUDIES: 1. Acute abdominal series on April 20, 2010, showed a     nonobstructive bowel gas pattern with moderate colonic fecal     material.  Moderate hiatal hernia.  No free air. 2. CT scan of the abdomen and pelvis on April 20, 2010, showed     colitis with a long segment of colonic wall thickening and     pericolonic fat stranding extending from the hepatic flexure     through the midsigmoid colon and is most prominent in the proximal     sigmoid colon.  Given the long segment of involvement of the colon,     infectious colitis is favored.  Ischemic colitis is difficult to     completely exclude.  Infrarenal abdominal aortic aneurysm measuring     5.4 x 5.8 cm axial dimension with no evidence of aneurysm rupture.     Fecal impaction.  Urinary bladder with moderate distention.  Large     hiatal hernia.  DISCHARGE LABORATORY VALUES:  Sodium was 143, potassium 3.7, chloride 111, bicarb 26, BUN 13, creatinine 1.40, glucose 88, calcium 8.7.  White blood cell count was 8.3, hemoglobin 10.5, hematocrit 30.4, platelets 145.  Stool cultures were negative and stools for ova and parasites were likewise negative.  HOSPITAL COURSE BY PROBLEMS: 1. Infectious colitis:  The patient was admitted and placed on empiric     Zosyn for presumed infectious colitis.  His white blood cell count,     which was elevated on initial presentation, normalized, and his     symptoms gradually improved.  His was subsequently transitioned     over to p.o. Cipro and Flagyl and he was able to tolerate this     switch prior to discharge.  At this time, the patient feels well     and symptoms his have completely resolved and therefore he will be     discharged home with an additional 7 days of oral therapy. 2. Fecal impaction:  The patient received a tap water enema and was     manually disimpacted.  He  is reporting good bowel movements at this     present time. 3. Hypertension:  The patient's antihypertensives were placed on hold     due to his acute kidney injury.  His discharge blood pressure is     119/73 and he has been instructed to hold his     hydrochlorothiazide/lisinopril for an additional 3 days. 4. Abdominal aortic aneurysm:  Stable by CT scan.  He follows up with     his vascular surgeon regularly. 5. Hyperlipidemia:  The patient can safely resume his statin therapy     at discharge. 6. Vertigo:  The patient can continue to take meclizine p.r.n. for     symptomatic treatment. 7. Hypokalemia:  The patient's potassium was fully repleted. 8. Acute renal failure/acute kidney injury:  The patient had     improvement in his creatinine over the course of his hospital stay     with IV fluids.  His initial creatinine was 1.7 and his discharge     creatinine is 1.40.  He is instructed to continue to hold     hydrochlorothiazide/lisinopril for an additional 3 days of therapy. 9. Moderate-to-large hiatal hernia:  The patient is asymptomatic and     can follow up his primary care physician.  DISPOSITION:  The patient is medically stable and be discharged home.  CONDITION ON DISCHARGE:  Improved.  Time spent coordinating care for discharge and discharge instructions equals 35 minutes.     Hillery Aldo, M.D.     CR/MEDQ  D:  04/25/2010  T:  04/26/2010  Job:  [id number]  cc:   Ricardo [name], M.D. Fax: [phone]  Electronically Signed by Hillery Aldo M.D. on 04/29/2010 04:18:06 PM

## 2010-05-16 NOTE — H&P (Signed)
[name], [name]             ACCOUNT NO.:  [id number]  MEDICAL RECORD NO.:  [id number]           PATIENT TYPE:  E  LOCATION:  WLED                         FACILITY:  [facility]  PHYSICIAN:  Baltazar Najjar, MD     DATE OF BIRTH:  [birth year]  DATE OF ADMISSION:  04/20/2010 DATE OF DISCHARGE:                             HISTORY & PHYSICAL   PRIMARY CARE PHYSICIAN:  [name], M.D.  CODE STATUS:  The patient is a Full Code.  HISTORY OF PRESENT ILLNESS:  [name] is a 77 year old man who has a good functional capacity as per his family.  He lives alone at a friend's home and usually is able to get around with the help of a walker and is fairly independent.  The patient was brought into the ER today with a chief complaint of sudden onset of abdominal pain.  As per him, it started about 8 hours ago.  He described his abdominal pain as diffuse.  Was unable to describe the nature of the abdominal pain and specify if it is sharp or crampy.  However, he stated that it is associated with nausea and vomiting.  Described the vomiting as nonbloody.  He was unable to give me a rating for his pain.  However, he stated that this is severe pain.  Denies any radiation of the pain to his back.  Denies any diarrhea or change in his bowel habits.  He had a bowel movement today, which he described as normal, nonbloody with no diarrhea or constipation.  Denies any dysuria, fever, chills or any other complaints.  In the ED the patient was given Zofran and Ativan and his blood pressure dropped to a systolic of 70.  He was resuscitated with IV fluid bolus of 2500 cc with improvement in his systolic blood pressure to 130s.  He also had a KUB abdominal series done in the ER that showed nonobstructive bowel gas pattern with moderate chronic fecal material and moderate hiatal hernia.  PAST MEDICAL HISTORY: 1. Hypertension. 2. Abdominal aortic aneurysm followed by Dr. Josephina Gip size 5.5  x     5.6 cm on Doppler sonogram of the abdominal aorta. 3. Hyperlipidemia. 4. Vertigo.  PAST SURGICAL HISTORY: 1. Appendectomy. 2. Cataract surgery. 3. Hernia repair.  SOCIAL HISTORY:  Lives alone at a friend's home.  No history of smoking or drinking or illicit drug use.  FAMILY HISTORY:  No family history of diabetes, hypertension or coronary artery disease.  ALLERGIES:  NO KNOWN DRUG ALLERGIES.  HOME MEDICATIONS:  Medication reconciliation is pending.  The patient was unable to tell me his home medicines.  However, as per the ER record, he is on simvastatin, meclizine and hydrocodone/acetaminophen. I also enquired about his home medicines from his family members and they were unable to specify what medicines he is on.  REVIEW OF SYSTEMS:  As above in HPI.  CHEST:  No shortness of breath. No cough.  CARDIOVASCULAR:  No chest pain or palpitation.  ABDOMEN: Significant for abdominal pain, nausea and vomiting.  No change in bowel habits.  GENITOURINARY:  No dysuria, no flank pain.  CONSTITUTIONAL:  No fever or chills.  NEUROLOGIC:  No headaches.  No change in mental status.  No weakness or numbness.  PHYSICAL EXAMINATION:  VITAL SIGNS:  Blood pressure 130/70, pulse rate was 75, afebrile, O2 saturations 97%, respiratory rate 16. GENERAL:  He is alert, in mild distress. HEENT:  Mucous membranes look dry. NECK:  Supple.  No JVD. CHEST:  Clear to auscultation bilaterally. CARDIOVASCULAR:  S1, S2.  Regular rhythm and rate. ABDOMEN:  Soft, diffuse tenderness noted.  No rebound.  No guarding. Bowel sounds heard. EXTREMITIES:  Showed no pedal edema. NEUROLOGIC:  He is alert, oriented and has no focal neurological deficits.  LABORATORY DATA:  Procalcitonin less than 0.10, lactic acid 0.8.  Point of care cardiac enzymes, troponin less than 0.05.  Urinalysis unremarkable.  Hepatic function panel unremarkable.  Lipase 43.  Sodium 134, potassium 4.6, BUN 33, creatinine 1.70, calcium  9.6.  CBC showed WBCs of 25.7, hemoglobin 15, hematocrit 38, platelets 231.  Neutrophil count 92%.  RADIOLOGY/IMAGING:  Acute abdominal x-ray series showed: 1. Nonobstructive bowel gas pattern with moderate chronic fecal     material. 2. Moderate hiatal hernia and no free air.  ASSESSMENT AND PLAN:  This is a 77 year old man with good functional capacity for his age presenting with acute symptoms of abdominal pain, nausea and vomiting. Abdominal pain, nausea and vomiting. 1. Given leukocytosis, infectious pathology needs to be ruled out.     Also the patient had a history of abdominal aortic aneurysm size of     5.6 x 5.5.  He will need an abdominal image to evaluate and rule     out any infectious etiology versus any aortic aneurysm rupture.     Given his acute renal failure/chronic kidney disease, I am hesitant     to give him contrast, given the fact that he is also severely     dehydrated.  I discussed his case with the radiologist to determine     what is the best image for him to evaluate post abdominal aortic     aneurysm and to rule out infectious etiology and we both decided to     proceed with a CT scan of the abdomen without contrast at this     time.  However, if his condition worsens, he will probably need     intravenous contrast to evaluate for any abdominal aortic     dissection.  Hopefully, with intravenous fluid his creatinine will     improve, given the fact that it is most likely secondary to     prerenal azotemia given his severe dehydration. 2. Given his significant leukocytosis, I am leaning more towards     infectious etiology rather than abdominal pain related to any other     abdominal aortic aneurysm issues. 3. We will send blood cultures x2. 4. I will start him empirically on antibiotics for now with Zosyn and     Flagyl pending further imaging studies/culture results. 5. Intravenous fluids with normal saline run at 150 cc per hour. 6. Gastrointestinal  prophylaxis with Protonix and sequential     compression devices for deep vein thrombosis prophylaxis pending. 7. I will keep him nothing per oral for now. 8. Code Status.  The patient is a Full Code. 9. Son updated at the bedside.          ______________________________ Baltazar Najjar, MD     SA/MEDQ  D:  04/20/2010  T:  04/20/2010  Job:  [id number]  cc:  [name], M.D. Fax: [phone]  Electronically Signed by Hannah Beat MD on 05/15/2010 09:32:39 PM

## 2010-07-18 ENCOUNTER — Inpatient Hospital Stay (HOSPITAL_COMMUNITY)
Admission: EM | Admit: 2010-07-18 | Discharge: 2010-07-22 | DRG: 392 | Disposition: A | Discharge status: Home Health | Payer: Medicare PPO | Attending: Internal Medicine | Admitting: Internal Medicine

## 2010-07-18 ENCOUNTER — Emergency Department (HOSPITAL_COMMUNITY): Payer: Medicare PPO

## 2010-07-18 DIAGNOSIS — R5381 Other malaise: Secondary | ICD-10-CM | POA: Diagnosis present

## 2010-07-18 DIAGNOSIS — N289 Disorder of kidney and ureter, unspecified: Secondary | ICD-10-CM | POA: Diagnosis present

## 2010-07-18 DIAGNOSIS — I498 Other specified cardiac arrhythmias: Secondary | ICD-10-CM | POA: Diagnosis present

## 2010-07-18 DIAGNOSIS — R42 Dizziness and giddiness: Secondary | ICD-10-CM | POA: Diagnosis present

## 2010-07-18 DIAGNOSIS — E871 Hypo-osmolality and hyponatremia: Secondary | ICD-10-CM | POA: Diagnosis present

## 2010-07-18 DIAGNOSIS — K5900 Constipation, unspecified: Secondary | ICD-10-CM | POA: Diagnosis present

## 2010-07-18 DIAGNOSIS — G319 Degenerative disease of nervous system, unspecified: Secondary | ICD-10-CM | POA: Diagnosis present

## 2010-07-18 DIAGNOSIS — I6789 Other cerebrovascular disease: Secondary | ICD-10-CM | POA: Diagnosis present

## 2010-07-18 DIAGNOSIS — I5040 Unspecified combined systolic (congestive) and diastolic (congestive) heart failure: Secondary | ICD-10-CM | POA: Diagnosis present

## 2010-07-18 DIAGNOSIS — E039 Hypothyroidism, unspecified: Secondary | ICD-10-CM | POA: Diagnosis present

## 2010-07-18 DIAGNOSIS — W19XXXA Unspecified fall, initial encounter: Secondary | ICD-10-CM | POA: Diagnosis present

## 2010-07-18 DIAGNOSIS — I509 Heart failure, unspecified: Secondary | ICD-10-CM | POA: Diagnosis present

## 2010-07-18 DIAGNOSIS — K219 Gastro-esophageal reflux disease without esophagitis: Principal | ICD-10-CM | POA: Diagnosis present

## 2010-07-18 DIAGNOSIS — Z8711 Personal history of peptic ulcer disease: Secondary | ICD-10-CM

## 2010-07-18 LAB — OCCULT BLOOD, POC DEVICE: Fecal Occult Bld: NEGATIVE

## 2010-07-18 LAB — CBC
HCT: 38.6 % — ABNORMAL LOW (ref 39.0–52.0)
MCH: 29.4 pg (ref 26.0–34.0)
MCHC: 34.7 g/dL (ref 30.0–36.0)
MCV: 84.6 fL (ref 78.0–100.0)
RDW: 14.4 % (ref 11.5–15.5)

## 2010-07-18 LAB — POCT CARDIAC MARKERS

## 2010-07-18 LAB — URINALYSIS, ROUTINE W REFLEX MICROSCOPIC
Bilirubin Urine: NEGATIVE
Hgb urine dipstick: NEGATIVE
Ketones, ur: NEGATIVE mg/dL
Protein, ur: NEGATIVE mg/dL
Specific Gravity, Urine: 1.02 (ref 1.005–1.030)
Urobilinogen, UA: 0.2 mg/dL (ref 0.0–1.0)

## 2010-07-18 LAB — BASIC METABOLIC PANEL
BUN: 46 mg/dL — ABNORMAL HIGH (ref 6–23)
CO2: 27 mEq/L (ref 19–32)
Chloride: 90 mEq/L — ABNORMAL LOW (ref 96–112)
Creatinine, Ser: 1.67 mg/dL — ABNORMAL HIGH (ref 0.4–1.5)
Glucose, Bld: 110 mg/dL — ABNORMAL HIGH (ref 70–99)

## 2010-07-18 LAB — DIFFERENTIAL
Eosinophils Relative: 0 % (ref 0–5)
Lymphocytes Relative: 12 % (ref 12–46)
Lymphs Abs: 1.6 10*3/uL (ref 0.7–4.0)
Monocytes Absolute: 1.9 10*3/uL — ABNORMAL HIGH (ref 0.1–1.0)
Monocytes Relative: 14 % — ABNORMAL HIGH (ref 3–12)

## 2010-07-19 ENCOUNTER — Inpatient Hospital Stay (HOSPITAL_COMMUNITY): Payer: Medicare PPO

## 2010-07-19 DIAGNOSIS — I359 Nonrheumatic aortic valve disorder, unspecified: Secondary | ICD-10-CM

## 2010-07-19 HISTORY — PX: TRANSTHORACIC ECHOCARDIOGRAM: SHX275

## 2010-07-19 LAB — COMPREHENSIVE METABOLIC PANEL
ALT: 14 U/L (ref 0–53)
AST: 18 U/L (ref 0–37)
Albumin: 2.7 g/dL — ABNORMAL LOW (ref 3.5–5.2)
Alkaline Phosphatase: 69 U/L (ref 39–117)
CO2: 27 mEq/L (ref 19–32)
Chloride: 96 mEq/L (ref 96–112)
Creatinine, Ser: 1.49 mg/dL (ref 0.4–1.5)
GFR calc Af Amer: 53 mL/min — ABNORMAL LOW (ref >60)
GFR calc non Af Amer: 44 mL/min — ABNORMAL LOW (ref >60)
Potassium: 4.8 mEq/L (ref 3.5–5.1)
Total Bilirubin: 0.6 mg/dL (ref 0.3–1.2)

## 2010-07-19 LAB — DIFFERENTIAL
Basophils Relative: 0 % (ref 0–1)
Eosinophils Absolute: 0.1 10*3/uL (ref 0.0–0.7)
Lymphs Abs: 1.5 10*3/uL (ref 0.7–4.0)
Monocytes Absolute: 1.6 10*3/uL — ABNORMAL HIGH (ref 0.1–1.0)
Monocytes Relative: 15 % — ABNORMAL HIGH (ref 3–12)

## 2010-07-19 LAB — CBC
Hemoglobin: 12 g/dL — ABNORMAL LOW (ref 13.0–17.0)
MCH: 29.7 pg (ref 26.0–34.0)
MCHC: 34.5 g/dL (ref 30.0–36.0)
MCV: 86.1 fL (ref 78.0–100.0)

## 2010-07-19 LAB — TROPONIN I: Troponin I: <0.3 ng/mL (ref <0.30)

## 2010-07-19 LAB — CK TOTAL AND CKMB (NOT AT ARMC)
CK, MB: 4.8 ng/mL — ABNORMAL HIGH (ref 0.3–4.0)
Relative Index: INVALID (ref 0.0–2.5)
Relative Index: INVALID (ref 0.0–2.5)
Total CK: 45 U/L (ref 7–232)

## 2010-07-19 LAB — CORTISOL: Cortisol, Plasma: 13.6 ug/dL

## 2010-07-20 LAB — CBC
HCT: 34 % — ABNORMAL LOW (ref 39.0–52.0)
Hemoglobin: 11.5 g/dL — ABNORMAL LOW (ref 13.0–17.0)
MCH: 29.1 pg (ref 26.0–34.0)
MCV: 86.1 fL (ref 78.0–100.0)
RBC: 3.95 MIL/uL — ABNORMAL LOW (ref 4.22–5.81)
WBC: 9.6 10*3/uL (ref 4.0–10.5)

## 2010-07-20 LAB — URINE CULTURE: Culture: NO GROWTH

## 2010-07-20 LAB — BASIC METABOLIC PANEL
BUN: 30 mg/dL — ABNORMAL HIGH (ref 6–23)
CO2: 27 mEq/L (ref 19–32)
Chloride: 100 mEq/L (ref 96–112)
Creatinine, Ser: 1.47 mg/dL (ref 0.4–1.5)
Glucose, Bld: 77 mg/dL (ref 70–99)
Potassium: 4.6 mEq/L (ref 3.5–5.1)

## 2010-07-21 LAB — CBC
Hemoglobin: 10.8 g/dL — ABNORMAL LOW (ref 13.0–17.0)
MCHC: 33.8 g/dL (ref 30.0–36.0)
Platelets: 193 10*3/uL (ref 150–400)

## 2010-07-21 LAB — BASIC METABOLIC PANEL
CO2: 26 mEq/L (ref 19–32)
Calcium: 9.2 mg/dL (ref 8.4–10.5)
GFR calc Af Amer: 54 mL/min — ABNORMAL LOW (ref >60)
GFR calc non Af Amer: 45 mL/min — ABNORMAL LOW (ref >60)
Sodium: 133 mEq/L — ABNORMAL LOW (ref 135–145)

## 2010-07-23 ENCOUNTER — Other Ambulatory Visit: Payer: Self-pay | Admitting: Vascular Surgery

## 2010-07-23 DIAGNOSIS — I714 Abdominal aortic aneurysm, without rupture: Secondary | ICD-10-CM

## 2010-07-25 NOTE — Discharge Summary (Signed)
Ricardo [name], Ricardo [name]             ACCOUNT NO.:  [id number]  MEDICAL RECORD NO.:  [id number]           PATIENT TYPE:  I  LOCATION:  1425                         FACILITY:  [facility]  PHYSICIAN:  Thad Ranger, MD       DATE OF BIRTH:  15-Jun-1913  DATE OF ADMISSION:  07/18/2010 DATE OF DISCHARGE:                              DISCHARGE SUMMARY   PRIMARY CARE PHYSICIAN:  [name], MD  PRIMARY CARDIOLOGIST:  Peter M. Sw[place], MD  DISCHARGE DIAGNOSES: 1. Fall with vertigo. 2. Gastroesophageal reflux disease. 3. Nonspecific tachycardia, improved. 4. Hypothyroidism. 5. Generalized debility. 6. Mild acute renal insufficiency, improved. 7. Systolic congestive heart failure, compensated. 8. Hyperlipidemia. 9. Hypertension. 10.History of normocytic anemia.  DISCHARGE MEDICATIONS: 1. Ensure t.i.d. with meals. 2. Lisinopril 20 mg p.o. daily. 3. Metoprolol 12.5 mg p.o. b.i.d. 4. Protonix 40 mg p.o. twice daily with meals. 5. Alprazolam 0.5 mg 1/2 to 1 tablet p.o. daily at bedtime as needed. 6. Aspirin 81 mg p.o. q.a.m. 7. Fish oil 1 capsule p.o. daily. 8. Flaxseed oil OTC 1 tablet p.o. daily. 9. Meclizine 25 mg p.o. t.i.d. as needed for vertigo/dizziness. 10.Phenergan 12.5 mg p.o. every 6 hours as needed for nausea. 11.Simvastatin 40 mg p.o. daily at bedtime.  The following combination of medication was stopped: Lisinopril/hydrochlorothiazide 20/12.5.  BRIEF HISTORY OF PRESENT ILLNESS:  Ricardo [name] is a 77 year old male with history of hypertension, hyperlipidemia, vertigo, apparently lost his balance and had fallen 4 times in the last several days prior to admission.  He also admitted to vertigo and acid reflux with an episode of nausea and vomiting.  In the emergency room, the patient's creatinine was noticed to be 1.67 with evidence of hyponatremia and hypocalcemia. The patient was admitted to the medical service.  RADIOLOGICAL DATA:  CT head without contrast on Jul 18, 2010, no acute intracranial abnormality or significant interval change.  Stable atrophy and white matter disease.  No acute fracture.  Right shoulder x-ray, degenerative changes in the right shoulder suggesting chronic rotator cuff arthroplasty progressing since the prior study, no evidence of acute fracture or dislocation.  Hip x-ray on Jul 18, 2010, degenerative changes, no acute bony abnormalities.  Lumbar spine x-rays, degenerative changes in the lumbar spine, no definitive acute fractures; abdominal aortic aneurysm.  MRI of the brain without contrast showed atrophy and chronic microvascular ischemia, no acute intracranial abnormality. Echocardiogram on Jul 19, 2010, showed EF of 35% to 40% with hypokinesis of inferior wall and posterior wall, cavity size was normal, grade 1 diastolic dysfunction.  BRIEF HOSPITALIZATION COURSE:  Ricardo [name] is a 77 year old male with prior history of hypertension, hyperlipidemia, vertigo who presented with multiple falls. 1. Falls with vertigo with dizziness.  The patient was also admitted     to the medical service and was found to be somewhat dehydrated as     well, likely secondary to the nausea and vomiting.  CT of the brain     did not show any acute stroke.  MRI was also obtained as the part     of workup which was negative for acute stroke.  It did  show atrophy     and chronic microvascular ischemia.  The patient had a physical     therapy evaluation done during the hospitalization who recommended     skilled nursing facility placement.  The patient was at this point     staying in independent facility. 2. Nausea and vomiting, likely secondary to peptic ulcer disease.  The     patient was placed on Protonix and his symptoms considerably     improved after that.  Cardiac enzymes were obtained, which were     negative for any acute ACS. 3. Mild acute renal insufficiency with hypocalcemia and hyponatremia.     The patient was gently hydrated  with IV fluids, and his sodium has     improved to 133, with calcium improved to 9.2, creatinine has     improved to 1.4 from 1.6 at the time of admission. 4. Systolic CHF with grade 1 diastolic dysfunction.  Currently     compensated.  Echocardiogram was obtained during the part of workup     for questionable near-syncope.  It did show EF 35% to 40%, however,     cardiac enzymes remained negative.  The patient had no symptoms at     all, and has cardiologist Dr. Peter Swaziland.  He is on appropriate     cardiac medications outpatient.  Hydrochlorothiazide was     discontinued secondary to dehydration, acute renal insufficiency,     and hypocalcemia.  The patient was placed on beta blockers instead     and continued on lisinopril, aspirin, and statin.  The patient will     be discharged to skilled nursing facility in a.m.  PHYSICAL EXAMINATION:  VITAL SIGNS:  At the time of my dictation, blood pressure 129/77, temperature 98.5, pulse 59, respirations 18, O2 sats 95% on 2 L. GENERAL:  The patient is alert, awake and oriented x3, not in acute distress. HEENT:  Anicteric sclerae and conjunctivae.  Pupils reactive to light and accommodation.  EOMI. NECK:  Supple.  No lymphopathy no JVD. CVS:  S1, S2 clear.  Regular rate and rhythm. CHEST:  Clear to auscultation bilaterally. ABDOMEN:  Soft, nontender, nondistended.  Normal bowel sounds. EXTREMITIES:  No cyanosis, clubbing or edema noted in upper or lower extremities bilaterally.  DISCHARGE FOLLOWUP:  With Dr. Oneta Rack in 1 to 2 weeks, Dr. Peter [place]and in next 1 to 2 weeks.  DISCHARGE TIME:  35 minutes.     Thad Ranger, MD     RR/MEDQ  D:  07/21/2010  T:  07/21/2010  Job:  [id number]  cc:   Peter M. [place], M.D. Fax: [phone]  Electronically Signed by Nemiah Kissner  on 07/25/2010 01:37:22 PM

## 2010-07-26 NOTE — Op Note (Signed)
NAMEOLNEY, MONIER             ACCOUNT NO.:  [id number]   MEDICAL RECORD NO.:  [id number]          PATIENT TYPE:  AMB   LOCATION:  DAY                          FACILITY:  [facility]   PHYSICIAN:  Lorre Munroe., M.D.DATE OF BIRTH:  Sep 02, 1913   DATE OF PROCEDURE:  10/01/2004  DATE OF DISCHARGE:                                 OPERATIVE REPORT   PREOPERATIVE DIAGNOSIS:  Right inguinal hernia.   POSTOPERATIVE DIAGNOSIS:  Indirect right inguinal hernia.   OPERATION:  Repair of right inguinal hernia.   SURGEON:  Lebron Conners, M.D.   ANESTHESIA:  Local with monitored anesthesia care and sedation.   DESCRIPTION OF PROCEDURE:  After the patient was monitored and sedated and  had routine preparation and draping of the right lower quadrant of the  abdomen and the right groin region, I liberally infused local anesthetic  throughout the operative field taking care to infuse deeply the lateral  aspect and attempt to block the lingual nerve. I then made a 6 cm slightly  oblique incision beginning just above the pubic tubercle and dissected down  through the fat until I identified the external oblique. I opened the fibers  of the external oblique aponeurosis into the superficial inguinal ring. The  spermatic cord was seen to be quite large. I mobilized the cord off the  inguinal floor and encircled it at the level of the pubic tubercle with a  Penrose drain. I dissected up further cremasteric fibers and saw that there  was a large indirect hernia but no direct hernia was present. I added more  local anesthetic and the patient seemed comfortable throughout the  procedure. I then made an incision on the proximal part of the spermatic  cord and dissected out a very large hernia sac containing what appeared to  be mostly omentum. After separating that from the vas deferens and the cord  vessels, I reduced the hernia through the deep ring and plugged the lateral  aspect of the deep ring with  a generous plug of polypropylene mesh which I  held in place with a figure-of-eight suture of 2-0 silk. I then fashioned a  patch of polypropylene mesh making a slit in it to allow the spermatic cord  to exit from the abdominal wall into the scrotum. I sewed that in place from  the pubic tubercle medially and superiorly with running basting 2-0 Prolene  suture and laterally and inferiorly with running simple 2-0 Prolene suture  in the shelving edge of the inguinal ligament. I used two sutures to join  the tails of the mesh together lateral to the cord and deep ring tightening  the deep ring. This seemed to provide a very secure hernia repair. After  assuring good hemostasis, I added more local anesthetic and then closed the  external oblique and subcutaneous tissues with separate running layers of  3-0 Vicryl suture and that I closed the skin with interrupted buried  intracuticular 4-0 Vicryl sutures reinforced by Steri-Strips. Sponge, needle  and instrument counts were correct. Blood loss was minimal. There were no  complications. The patient tolerated the operation  well.       WB/MEDQ  D:  10/01/2004  T:  10/01/2004  Job:  [id number]   cc:   Bertram Millard. Dahlstedt, M.D.  [address]  [place]  [place] [address]  Fax: [phone]   Gordy Savers, M.D. [facility]

## 2010-07-26 NOTE — Op Note (Signed)
[facility]  Patient:    Ricardo [name], Ricardo [name]                    MRN: [id number] Proc. Date: 05/12/00 Adm. Date:  [id number] Attending:  Meredith Leeds                           Operative Report  PREOPERATIVE DIAGNOSIS:  Recurrent direct left inguinal hernia.  POSTOPERATIVE DIAGNOSIS:  Recurrent direct left inguinal hernia.  OPERATION PERFORMED:  Repair of left inguinal hernia.  SURGEON:  Zigmund Daniel, M.D.  ANESTHESIA:  General.  DESCRIPTION OF PROCEDURE:  After the patient had general anesthesia and routine preparation and draping of the abdomen, a short transverse infraumbilical incision dissected down to the rectus muscles and incised the fascia of the left rectus transversely for a short distance and dissected wtih my finger underneath the rectus muscle creating a plane in the preperitoneal space.  I then put in a dilating balloon and inflated it under vision.  It inflated within the abdomen and so I withdrew it and tried to feel the hole in the peritoneum and could not find it.  I inflated the balloon again in the proper plane and got reasonably good visualization of the pubic tubercle and structures of the lower anterior abdominal wall.  I then put in the hernia repair cannula and inflated the balloon and got dilatation of the tissues in the preperitoneal space.  This also resulted in induction of large pneumoperitoneum.  I put in a Veress needle in the right upper quadrant in hopes of being able to achieve adequate visualization but could not really visualize the hernia or the anatomic details necessary for performance of the procedure because of the large pneumoperiotoneum.  I again removed my equipment, palpated thoroughly trying to find the hole in the peritoneum but I couldnt find it and so I decided to abandon the laparoscopic technique.  I kept the Veress needle in until the abdomen was nicely decompressed as far  as removal of the CO2.  I had been able to very well visualize the bowel and did not feel that there was any evidence of any injury to the bowel.  I then opened the inguinal incision, dissected down through the subcutaneous tissue and scar to the external oblique and opened it into the external ring, taking care to dissect away the cord and trying very hard to avoid any injury of the cord vessels.  I then opened the external oblique a bit laterally and dissected up the cord.  There seemed to be some defect of the internal ring just medial to the cord but at first the hernia was difficult to determine.  I dissected down along the inguinal ligament to an area of the femoral triangle and found no femoral hernia.  I then encircled the cord with a Penrose drain and dissected it away from the inguinal floor, opened the cord longitudinally in its proximal part and thoroughly dissected it and found no indirect hernia. Further dissection disclosed a direct hernia defect right in the medial aspect of the internal inguinal ring.  I reduced that and held that in with a plug of Marlex mesh held in place with 2-0 silk stitch.  I fashioned another patch of Marlex mesh and sewed it in from the pubic tubercle inferiorly along the inguinal ligament taking care to avoid injury to the femoral vein. Superiorly, I sewed it  in the external oblique muscle well above the previous repair using 2-0 Prolene in a basting stitch.  I made a slit in it to allow exit of the spermatic cord and sewed tails of the slit together just lateral to the cord utilizing a 2-0 Prolene.  I then felt the hernia was adequately repaired.  I trimmed off a little bit of mesh that was extra.  Hemostasis was good.  I anesthetized the area with longacting local anesthetic.  I closed the external oblique and subcutaneous tissues with running layers of 3-0 Vicryl and closed the skin with intercuticular 4-0 Vicryl and Steri-Strips.  I closed the  umbilical incision with 0 Vicryl for the fascia and 4-0 Vicryl for the skin and applied Steri-Strips.  The patient was stable through the operation. DD:  05/12/00 TD:  05/12/00 Job: [id number]

## 2010-07-26 NOTE — Op Note (Signed)
. [facility]  Patient:    Ricardo [name], Ricardo [name]                    MRN: [id number] Proc. Date: 07/15/99 Adm. Date:  [id number] Disc. Date: [id number] Attending:  Meredith Leeds                           Operative Report  PREOPERATIVE DIAGNOSIS:  Direct left inguinal hernia.  POSTOPERATIVE DIAGNOSIS:  Direct left inguinal hernia.  OPERATION:  Repair of left inguinal hernia.  SURGEON:  Zigmund Daniel, M.D.  ANESTHESIA:  Local with sedation and monitored anesthesia care.  DESCRIPTION OF PROCEDURE:  After adequate sedation, monitoring, and routine preparation and draping of the left inguinal region, I liberally infused local 0.25% bupivacaine with epinephrine into the skin, subcutaneous tissues, and deeper tissues of the left inguinal region.  I reinforced my block as I did the operation and deepened the dissection.  I found that local anesthetic was adequate throughout.  The patient was comfortable throughout the procedure.  I made a slightly oblique skin incision, beginning just above and lateral to the pubic tubercle, and dissected down through the fat to the external oblique.  I exposed the external ring and opened the aponeurosis of the external oblique into the external ring.  I exposed the spermatic cord, dissected it off of the inguinal floor, and controlled it with a Penrose drain.  I dissected fibers of the cremaster muscle up to the cord and internal ring.  I took care to protect the ilioinguinal nerve.  I saw that there was a direct hernia just medial to the internal ring, actually seeming to emerge from the medial and inferior part of the internal ring.  I dissected the spermatic cord proximally to exclude an indirect hernia, and I did not find an indirect hernia.  After freeing the direct hernia up from the spermatic cord, I reduced it and held it in with a plug of Marlex mesh sewn in with a figure-of-eight suture of 2-0 silk.  I  then fashioned a patch of Marlex mesh to the approximate size of the inguinal floor, made a slit in it for the spermatic cord, and sewed it in place with 2-0 Prolene suture, beginning at the pubic tubercle and sewing inferiorly and laterally along the inguinal ligament and medially and superiorly with a ______ stitch in the internal oblique fascia.  I used a single suture to join the tails of the mesh together, just lateral to the cord.  I placed the tails of the mesh underneath the lateral external oblique. Hemostasis was good.  Sponge, needle, and instrument counts were correct.  I closed the external oblique and subcutaneous tissues with separate layers of running 3-0 Vicryl suture.  I closed the skin with interrupted, 3-0 Vicryl suture and Steri-Strips.  I applied a bulky bandage.  The patient tolerated the operation well. DD:  07/15/99 TD:  07/16/99 Job: [id number]

## 2010-08-01 DIAGNOSIS — R269 Unspecified abnormalities of gait and mobility: Secondary | ICD-10-CM | POA: Insufficient documentation

## 2010-08-01 DIAGNOSIS — M67919 Unspecified disorder of synovium and tendon, unspecified shoulder: Secondary | ICD-10-CM

## 2010-08-01 DIAGNOSIS — F411 Generalized anxiety disorder: Secondary | ICD-10-CM

## 2010-08-01 DIAGNOSIS — N289 Disorder of kidney and ureter, unspecified: Secondary | ICD-10-CM

## 2010-08-01 DIAGNOSIS — I509 Heart failure, unspecified: Secondary | ICD-10-CM

## 2010-08-01 DIAGNOSIS — K279 Peptic ulcer, site unspecified, unspecified as acute or chronic, without hemorrhage or perforation: Secondary | ICD-10-CM

## 2010-08-01 DIAGNOSIS — R609 Edema, unspecified: Secondary | ICD-10-CM

## 2010-08-01 DIAGNOSIS — H9190 Unspecified hearing loss, unspecified ear: Secondary | ICD-10-CM

## 2010-08-01 DIAGNOSIS — G4700 Insomnia, unspecified: Secondary | ICD-10-CM

## 2010-08-01 DIAGNOSIS — M7500 Adhesive capsulitis of unspecified shoulder: Secondary | ICD-10-CM

## 2010-08-01 DIAGNOSIS — N400 Enlarged prostate without lower urinary tract symptoms: Secondary | ICD-10-CM

## 2010-08-01 HISTORY — DX: Unspecified abnormalities of gait and mobility: R26.9

## 2010-08-01 HISTORY — DX: Heart failure, unspecified: I50.9

## 2010-08-01 HISTORY — DX: Peptic ulcer, site unspecified, unspecified as acute or chronic, without hemorrhage or perforation: K27.9

## 2010-08-01 HISTORY — DX: Disorder of kidney and ureter, unspecified: N28.9

## 2010-08-01 HISTORY — DX: Adhesive capsulitis of unspecified shoulder: M75.00

## 2010-08-01 HISTORY — DX: Edema, unspecified: R60.9

## 2010-08-01 HISTORY — DX: Generalized anxiety disorder: F41.1

## 2010-08-01 HISTORY — DX: Unspecified hearing loss, unspecified ear: H91.90

## 2010-08-01 HISTORY — DX: Unspecified disorder of synovium and tendon, unspecified shoulder: M67.919

## 2010-08-01 HISTORY — DX: Insomnia, unspecified: G47.00

## 2010-08-01 HISTORY — DX: Benign prostatic hyperplasia without lower urinary tract symptoms: N40.0

## 2010-08-06 ENCOUNTER — Encounter: Payer: Self-pay | Admitting: Nurse Practitioner

## 2010-08-06 DIAGNOSIS — M25559 Pain in unspecified hip: Secondary | ICD-10-CM

## 2010-08-06 DIAGNOSIS — E559 Vitamin D deficiency, unspecified: Secondary | ICD-10-CM

## 2010-08-06 HISTORY — DX: Pain in unspecified hip: M25.559

## 2010-08-06 HISTORY — DX: Vitamin D deficiency, unspecified: E55.9

## 2010-08-07 ENCOUNTER — Encounter: Payer: Medicare PPO | Admitting: Nurse Practitioner

## 2010-08-18 NOTE — H&P (Signed)
NAMELEDARIUS, LEESON             ACCOUNT NO.:  [id number]  MEDICAL RECORD NO.:  [id number]           PATIENT TYPE:  E  LOCATION:  WLED                         FACILITY:  [facility]  PHYSICIAN:  Massie Maroon, MD        DATE OF BIRTH:  [birth year]  DATE OF ADMISSION:  07/18/2010 DATE OF DISCHARGE:                             HISTORY & PHYSICAL   CHIEF COMPLAINT:  Lost balance and fall.  HISTORY OF PRESENT ILLNESS:  A 77 year old male with a history of hypertension, hyperlipidemia, vertigo, hypothyroidism, apparently lost balance and has fallen 4 times in the last several days.  He admits to vertigo and acid reflux and has had an episode of nausea, vomiting.  The patient denies any chest pain, palpitations, diarrhea, bright red blood per rectum, or black stool.  The patient was seen in the ED and labs were performed and showed that he was dehydrated.  His BUN and creatinine were 46 and 1.67.  The patient also had evidence of hyponatremia and hypercalcemia.  The patient will be admitted for workup of dehydration, hyponatremia, hypercalcemia, and mild anemia.  PAST MEDICAL HISTORY: 1. Infectious colitis. 2. History of fecal impaction. 3. Hypertension. 4. Hyperlipidemia. 5. History of abdominal aortic aneurysm. 6. History of vertigo. 7. Hypokalemia. 8. History of acute renal failure. 9. Hiatal hernia/GERD/peptic ulcer disease. 10.Normocytic anemia.  PAST SURGICAL HISTORY: 1. Appendectomy. 2. Cataract surgery. 3. Hernia repair.  SOCIAL HISTORY:  The patient lives at [facility].  He is retired Technical brewer.  He does not smoke or drink.  He does not do any illicit drugs.  FAMILY HISTORY:  His brother had CHF.  ALLERGIES:  No known drug allergies.  MEDICATIONS:  Please see med rec.  REVIEW OF SYSTEMS:  Negative for all 10-organ systems except for pertinent positives stated above.  PHYSICAL EXAMINATION:  VITAL SIGNS:  Temperature 98.1, pulse 111, blood pressure is  147/78, pulse oximetry is 94% on room air. HEENT:  Anicteric, EOMI, no nystagmus, pupils 1.5 mm, symmetric, direct and consensual near reflexes intact.  Mucous membranes moist. NECK:  No JVD, no bruit, no thyromegaly, no adenopathy. HEART:  Borderline tachycardic, S1 and S2.  No murmurs, gallops, or rubs. LUNGS:  Clear to auscultation bilaterally. ABDOMEN:  Soft, nontender, nondistended.  Positive bowel sounds. EXTREMITIES:  No cyanosis, clubbing, or edema. SKIN:  No rashes. LYMPH NODES:  No adenopathy. NEUROLOGIC:  Nonfocal.  Cranial nerves II through XII intact.  Reflexes 2+, symmetric, diffuse with downgoing toes bilaterally, Motor strength 5/5 in all 4 extremities, pinprick intact. Good finger-to- nose.  LABORATORY DATA:  Sodium 126, potassium 4.7, BUN 46, creatinine 1.67, calcium 10.9.  Urinalysis negative.  WBCs 13.5, hemoglobin 13.4, platelet count 274.  ASSESSMENT AND PLAN: 1. Fall/vertigo:  We will check CT brain, noncontrast MRI in the     morning to rule out any cerebellar stroke. 2. Nausea, vomiting:  The patient will be placed on Protonix 40 mg     p.o. daily in light of history of peptic ulcer disease and     gastroesophageal reflux disease and we will treat him  symptomatically using Zofran and Phenergan.  We will also check a     set of cardiac markers. 3. Tachycardia/? supraventricular tachycardia:  Please check a 12-lead     EKG, check a TSH, check cardiac 2-D echo, check magnesium level. 4. Hypothyroidism.  Check a TSH. 5. Acute renal failure:  This is likely secondary to dehydration.  We     will hydrate with normal saline. 6. Hyponatremia:  Check a serum osmolality, cortisol, TSH, urine     sodium, urinalysis and hydrate gently with normal saline.  If     studies seem like syndrome of inappropriate secretion of     antidiuretic hormone, then please obtain a CT chest, abdomen, and     pelvis. 7. Hypercalcemia:  We will hydrate gently with normal saline IV  for     now.  If the patient remains persistently hypercalcemic, then     consider checking vitamin D25-OH, vitamin D1, 25-OH, PTH, PTH     related peptide, magnesium, phosphorus, TSH, SPEP, UPEP. 8. Fall:  Check x-rays of the shoulder, pelvis, hips bilaterally. 9. Deep vein thrombosis prophylaxis, SCDs.     Massie Maroon, MD     JYK/MEDQ  D:  07/18/2010  T:  07/18/2010  Job:  [id number]  Electronically Signed by Pearson Grippe MD on 08/18/2010 07:39:30 PM

## 2010-09-13 DIAGNOSIS — R350 Frequency of micturition: Secondary | ICD-10-CM

## 2010-09-13 HISTORY — DX: Frequency of micturition: R35.0

## 2010-10-14 ENCOUNTER — Telehealth: Payer: Self-pay | Admitting: *Deleted

## 2010-10-14 NOTE — Telephone Encounter (Signed)
Spoke with patients son and patient is in assisted living and patient declines to come for an office visit at this time. The son states that the MD at the assisted living is managing his anemia and they will refer him back if needed.

## 2010-11-04 ENCOUNTER — Encounter: Payer: Self-pay | Admitting: Vascular Surgery

## 2010-11-05 ENCOUNTER — Ambulatory Visit
Admission: RE | Admit: 2010-11-05 | Discharge: 2010-11-05 | Disposition: A | Discharge status: Home / Self Care | Payer: Medicare PPO | Source: Ambulatory Visit | Attending: Vascular Surgery | Admitting: Vascular Surgery

## 2010-11-05 ENCOUNTER — Ambulatory Visit: Payer: Medicare Other | Admitting: Vascular Surgery

## 2010-11-05 ENCOUNTER — Other Ambulatory Visit: Payer: Medicare PPO

## 2010-11-05 ENCOUNTER — Ambulatory Visit (INDEPENDENT_AMBULATORY_CARE_PROVIDER_SITE_OTHER): Payer: Medicare PPO | Admitting: Vascular Surgery

## 2010-11-05 VITALS — BP 96/52 | HR 57 | Resp 22 | Ht 72.0 in | Wt 162.0 lb

## 2010-11-05 DIAGNOSIS — Z01818 Encounter for other preprocedural examination: Secondary | ICD-10-CM

## 2010-11-05 DIAGNOSIS — I951 Orthostatic hypotension: Secondary | ICD-10-CM

## 2010-11-05 DIAGNOSIS — I714 Abdominal aortic aneurysm, without rupture: Secondary | ICD-10-CM

## 2010-11-05 HISTORY — DX: Orthostatic hypotension: I95.1

## 2010-11-05 MED ORDER — IOHEXOL 350 MG/ML SOLN: 100.0000 mL | Freq: Once | INTRAVENOUS | Status: AC | PRN

## 2010-11-05 NOTE — Progress Notes (Signed)
Subjective:     Patient ID: Ricardo [name], male   DOB: 09/25/1913, 77 y.o.   MRN: [id number]  HPI this 77 year old male patient who resides at friend's home has a known abdominal aortic aneurysm approximately 6 cm in diameter. Today he had a CT angiogram which I have reviewed by computer. He has a long neck which is approximately 28 mm in diameter. The neck is somewhat tortuous to the patient's right side in the aneurysm itself has an approximate 6 cm diameter. Iliac systems appear good. He does appear to be a good candidate for aortic stent grafting. He has no history of coronary artery disease.  Review of Systems his biggest problems are arthritis in the shoulders and he does walk with a walker but is able to get around. He denies chest pain dyspnea on exertion PND orthopnea or    Objective:   Physical Exam blood pressure 96/50 heart rate 67 respirations 22 Gen. he is an elderly male who is alert and oriented x3 in no apparent distress Abdomen is soft and 6 a minute. Mastoids nontender. He has 3+ femoral popliteal pulses palpable bilaterally.    Assessment:     The patient is a stent graft candidate for this aneurysm but he is 77 years old I discussed this with him and his family member today and they are wanting to pursue treatment but had not made a definite decision.    Plan:  #1 we'll schedule a Cardiolite exam #2 patient and family will discuss whether he would like to proceed with aortic stent grafting of this aneurysm #3 return to see me in 3 weeks for further discussion

## 2010-11-05 NOTE — Progress Notes (Signed)
Addended by: Merri Ray A on: 11/05/2010 03:41 PM   Modules accepted: Orders

## 2010-11-14 ENCOUNTER — Ambulatory Visit (HOSPITAL_COMMUNITY): Payer: Medicare PPO | Attending: Vascular Surgery | Admitting: Radiology

## 2010-11-14 DIAGNOSIS — I491 Atrial premature depolarization: Secondary | ICD-10-CM

## 2010-11-14 DIAGNOSIS — Z01818 Encounter for other preprocedural examination: Secondary | ICD-10-CM

## 2010-11-14 DIAGNOSIS — I714 Abdominal aortic aneurysm, without rupture, unspecified: Secondary | ICD-10-CM | POA: Insufficient documentation

## 2010-11-14 DIAGNOSIS — Z01810 Encounter for preprocedural cardiovascular examination: Secondary | ICD-10-CM | POA: Insufficient documentation

## 2010-11-14 DIAGNOSIS — I4949 Other premature depolarization: Secondary | ICD-10-CM

## 2010-11-14 DIAGNOSIS — I440 Atrioventricular block, first degree: Secondary | ICD-10-CM

## 2010-11-14 MED ORDER — TECHNETIUM TC 99M TETROFOSMIN IV KIT
33.0000 | PACK | Freq: Once | INTRAVENOUS | Status: AC | PRN
  Administered 2010-11-14: 33 via INTRAVENOUS

## 2010-11-14 MED ORDER — SODIUM CHLORIDE 0.9 % IV BOLUS (SEPSIS)
750.0000 mL | Freq: Once | INTRAVENOUS | Status: AC
  Administered 2010-11-14: 750 mL via INTRAVENOUS

## 2010-11-14 MED ORDER — TECHNETIUM TC 99M TETROFOSMIN IV KIT
11.0000 | PACK | Freq: Once | INTRAVENOUS | Status: AC | PRN
  Administered 2010-11-14: 11 via INTRAVENOUS

## 2010-11-14 MED ORDER — REGADENOSON 0.4 MG/5ML IV SOLN
0.4000 mg | Freq: Once | INTRAVENOUS | Status: AC
  Administered 2010-11-14: 0.4 mg via INTRAVENOUS

## 2010-11-14 NOTE — Progress Notes (Signed)
[facility] SITE 3 NUCLEAR MED [address] [place] [address] [phone]  Cardiology Nuclear Med Study  Ricardo [name] is a 77 y.o. male [id number] Mar 17, 1913   Nuclear Med Background Indication for Stress Test:  Evaluation for Ischemia and Pending Surgical Clearance: AAA repair, Dr. Josephina Gip History:  5/12 Echo: EF=35-40%; AAA; CHF Cardiac Risk Factors: Hypertension, Lipids and PVD  Symptoms:  Dizziness, DOE, Fatigue, Nausea and Vomiting   Nuclear Pre-Procedure Caffeine/Decaff Intake:  None NPO After: 7:00am   Lungs:  Diminished breath sounds, no wheezing IV 0.9% NS with Angio Cath:  20g  IV Site: R Forearm  IV Started by:  Stanton Kidney, EMT-P  Chest Size (in):  42 Cup Size: n/a  Height: 5\' 8"  (1.727 m)  Weight:  162 lb (73.483 kg)  BMI:  Body mass index is 24.63 kg/(m^2). Tech Comments:  Metoprolol held this am, per patient's daughter in law.    Nuclear Med Study 1 or 2 day study: 1 day  Stress Test Type:  Lexiscan  Reading MD: Charlton Haws, MD  Order Authorizing Provider:  Dr. Josephina Gip, MD  Resting Radionuclide: Technetium 76m Tetrofosmin  Resting Radionuclide Dose: 11 mCi   Stress Radionuclide:  Technetium 69m Tetrofosmin  Stress Radionuclide Dose: 33 mCi           Stress Protocol Rest HR: 63 Stress HR: 150  Rest BP: 98/62 Stress BP: 80/40  Exercise Time (min): n/a METS: n/a   Predicted Max HR: 125 bpm % Max HR: 120 bpm Rate Pressure Product: 21308   Dose of Adenosine (mg):  n/a Dose of Lexiscan: 0.4 mg  Dose of Atropine (mg): n/a Dose of Dobutamine: n/a mcg/kg/min (at max HR)  Stress Test Technologist: Irean Hong, RN  Nuclear Technologist:  Domenic Polite, CNMT     Rest Procedure:  Myocardial perfusion imaging was performed at rest 45 minutes following the intravenous administration of Technetium 60m Tetrofosmin. Rest ECG: NSR 1 degree AVB, poor R wave progression, frequent PAC's.  Stress Procedure:  The patient  received IV Lexiscan 0.4 mg over 15-seconds.  Technetium 45m Tetrofosmin injected at 30-seconds.  There were no significant changes with Lexiscan. The patient complained of dizziness with skin color paleness became more pronounced. The patient had  LOC for 3 to 5 seconds which returned quickly with trendelenburg position.BP 80/40 with IV 0.9% NACL fluids hung open.BP improved after 750cc IV fluid total.There were frequent PAC's, occasional PVC's. Quantitative spect images were obtained after a 45 minute delay. Stress ECG: No significant change from baseline ECG  QPS Raw Data Images:  Normal; no motion artifact; normal heart/lung ratio. Stress Images:  Normal homogeneous uptake in all areas of the myocardium. Rest Images:  Normal homogeneous uptake in all areas of the myocardium. Subtraction (SDS):  Normal Transient Ischemic Dilatation (Normal <1.22):  1.21 Lung/Heart Ratio (Normal <0.45):  .29  Quantitative Gated Spect Images QGS EDV:  111 ml QGS ESV:  42 ml QGS cine images:  NL LV Function; NL Wall Motion QGS EF: 62%  Impression Exercise Capacity:  Lexiscan with no exercise. BP Response:  Normal blood pressure response. Clinical Symptoms:  No chest pain. ECG Impression:  Short run of SVT Comparison with Prior Nuclear Study: No previous nuclear study performed  Overall Impression:  Normal stress nuclear study.  Short run of SVT with lexiscan    Charlton Haws

## 2010-11-25 ENCOUNTER — Encounter: Payer: Self-pay | Admitting: Vascular Surgery

## 2010-11-26 ENCOUNTER — Ambulatory Visit (INDEPENDENT_AMBULATORY_CARE_PROVIDER_SITE_OTHER): Payer: Medicare PPO | Admitting: Vascular Surgery

## 2010-11-26 ENCOUNTER — Ambulatory Visit: Payer: Medicare PPO | Admitting: Vascular Surgery

## 2010-11-26 ENCOUNTER — Encounter: Payer: Self-pay | Admitting: Vascular Surgery

## 2010-11-26 VITALS — BP 107/57 | HR 58 | Resp 18 | Wt 161.0 lb

## 2010-11-26 DIAGNOSIS — I714 Abdominal aortic aneurysm, without rupture, unspecified: Secondary | ICD-10-CM

## 2010-11-26 NOTE — Progress Notes (Signed)
Addended by: Judy Pimple on: 11/26/2010 04:30 PM   Modules accepted: Orders

## 2010-11-26 NOTE — Progress Notes (Signed)
Subjective:     Patient ID: Ricardo [name], male   DOB: 05-Aug-1913, 76 y.o.   MRN: [id number]  HPI this 77 year old male patient returns today with his daughter to discuss possible aortic aneurysm treatment. He had a CT angiogram recently which I have reviewed. He is a candidate for aortic stent grafting. The aneurysm measures approximately 6 cm in diameter depending on where the transverse measurement is made. It is very tortuous so some measurements appear larger than the actual diameter of the aneurysm. He had a Cardiolite performed recently which was low risk essentially normal. He has had no chest pain abdominal pain does have chronic back symptoms. He lives with friends and ambulates somewhat.   Review of Systems     Objective:   Physical Exam blood pressure 107/57 heart rate 68 respirations 18 Gen. he is an elderly male who appears somewhat frail. He is alert and oriented x3. His hearing is poor. Chest free of rhonchi or wheezing Abdomen pulsatile mass approximating 6 cm in diameter.    Assessment:     6 cm infrarenal abdominal aortic aneurysm which is candidate for aortic stent grafting. I had long discussion with patient and daughter today regarding the fact that he is 77 years old and somewhat frail and recommended observation with repeat ultrasound in 6 months. If the family strongly desires treatment of this aneurysm we will proceed with stent grafting. If that is the case they will be in touch with me.    Plan:    return 6 months with duplex scan of large infrarenal abdominal aortic aneurysm. The family decides they would prefer to have aortic stent grafting performed we will schedule that in near future.

## 2010-12-13 ENCOUNTER — Ambulatory Visit (HOSPITAL_COMMUNITY)
Admission: RE | Admit: 2010-12-13 | Discharge: 2010-12-13 | Disposition: A | Discharge status: Home / Self Care | Payer: Medicare PPO | Source: Ambulatory Visit | Attending: Vascular Surgery | Admitting: Vascular Surgery

## 2010-12-13 ENCOUNTER — Encounter (HOSPITAL_COMMUNITY)
Admission: RE | Admit: 2010-12-13 | Discharge: 2010-12-13 | Disposition: A | Discharge status: Home / Self Care | Payer: Medicare PPO | Source: Ambulatory Visit | Attending: Vascular Surgery | Admitting: Vascular Surgery

## 2010-12-13 ENCOUNTER — Other Ambulatory Visit: Payer: Self-pay | Admitting: Vascular Surgery

## 2010-12-13 DIAGNOSIS — Z01812 Encounter for preprocedural laboratory examination: Secondary | ICD-10-CM | POA: Insufficient documentation

## 2010-12-13 DIAGNOSIS — K449 Diaphragmatic hernia without obstruction or gangrene: Secondary | ICD-10-CM | POA: Insufficient documentation

## 2010-12-13 DIAGNOSIS — I498 Other specified cardiac arrhythmias: Secondary | ICD-10-CM | POA: Insufficient documentation

## 2010-12-13 DIAGNOSIS — M47814 Spondylosis without myelopathy or radiculopathy, thoracic region: Secondary | ICD-10-CM | POA: Insufficient documentation

## 2010-12-13 DIAGNOSIS — Z01811 Encounter for preprocedural respiratory examination: Secondary | ICD-10-CM

## 2010-12-13 DIAGNOSIS — Z01810 Encounter for preprocedural cardiovascular examination: Secondary | ICD-10-CM | POA: Insufficient documentation

## 2010-12-13 DIAGNOSIS — I447 Left bundle-branch block, unspecified: Secondary | ICD-10-CM | POA: Insufficient documentation

## 2010-12-13 DIAGNOSIS — I700 Atherosclerosis of aorta: Secondary | ICD-10-CM | POA: Insufficient documentation

## 2010-12-13 DIAGNOSIS — I440 Atrioventricular block, first degree: Secondary | ICD-10-CM | POA: Insufficient documentation

## 2010-12-13 LAB — BLOOD GAS, ARTERIAL
Drawn by: 344381
FIO2: 0.21 %
TCO2: 27.4 mmol/L (ref 0–100)
pCO2 arterial: 43.1 mmHg (ref 35.0–45.0)
pH, Arterial: 7.399 (ref 7.350–7.450)

## 2010-12-13 LAB — CBC
MCH: 29.1 pg (ref 26.0–34.0)
MCHC: 33.4 g/dL (ref 30.0–36.0)
RDW: 12.8 % (ref 11.5–15.5)

## 2010-12-13 LAB — COMPREHENSIVE METABOLIC PANEL
ALT: 6 U/L (ref 0–53)
Albumin: 3.6 g/dL (ref 3.5–5.2)
Alkaline Phosphatase: 83 U/L (ref 39–117)
BUN: 37 mg/dL — ABNORMAL HIGH (ref 6–23)
Chloride: 102 mEq/L (ref 96–112)
Creatinine, Ser: 1.37 mg/dL — ABNORMAL HIGH (ref 0.50–1.35)
GFR calc non Af Amer: 42 mL/min — ABNORMAL LOW (ref >90)
Glucose, Bld: 92 mg/dL (ref 70–99)
Total Protein: 6.5 g/dL (ref 6.0–8.3)

## 2010-12-13 LAB — URINALYSIS, ROUTINE W REFLEX MICROSCOPIC
Ketones, ur: NEGATIVE mg/dL
Leukocytes, UA: NEGATIVE
Nitrite: NEGATIVE
Protein, ur: NEGATIVE mg/dL

## 2010-12-13 LAB — PROTIME-INR
INR: 1.07 (ref 0.00–1.49)
Prothrombin Time: 14.1 seconds (ref 11.6–15.2)

## 2010-12-13 LAB — SURGICAL PCR SCREEN
MRSA, PCR: POSITIVE — AB
Staphylococcus aureus: POSITIVE — AB

## 2010-12-18 ENCOUNTER — Inpatient Hospital Stay (HOSPITAL_COMMUNITY): Payer: Medicare PPO

## 2010-12-18 ENCOUNTER — Inpatient Hospital Stay (HOSPITAL_COMMUNITY)
Admission: RE | Admit: 2010-12-18 | Discharge: 2010-12-20 | DRG: 238 | Disposition: A | Discharge status: Home / Self Care | Payer: Medicare PPO | Source: Ambulatory Visit | Attending: Vascular Surgery | Admitting: Vascular Surgery

## 2010-12-18 DIAGNOSIS — N400 Enlarged prostate without lower urinary tract symptoms: Secondary | ICD-10-CM | POA: Diagnosis present

## 2010-12-18 DIAGNOSIS — I714 Abdominal aortic aneurysm, without rupture, unspecified: Principal | ICD-10-CM | POA: Diagnosis present

## 2010-12-18 DIAGNOSIS — E785 Hyperlipidemia, unspecified: Secondary | ICD-10-CM | POA: Diagnosis present

## 2010-12-18 DIAGNOSIS — K219 Gastro-esophageal reflux disease without esophagitis: Secondary | ICD-10-CM | POA: Diagnosis present

## 2010-12-18 DIAGNOSIS — M129 Arthropathy, unspecified: Secondary | ICD-10-CM | POA: Diagnosis present

## 2010-12-18 DIAGNOSIS — Z7982 Long term (current) use of aspirin: Secondary | ICD-10-CM

## 2010-12-18 DIAGNOSIS — I10 Essential (primary) hypertension: Secondary | ICD-10-CM | POA: Diagnosis present

## 2010-12-18 DIAGNOSIS — I739 Peripheral vascular disease, unspecified: Secondary | ICD-10-CM | POA: Diagnosis present

## 2010-12-18 DIAGNOSIS — Z79899 Other long term (current) drug therapy: Secondary | ICD-10-CM

## 2010-12-18 HISTORY — PX: ABDOMINAL AORTIC ANEURYSM REPAIR: SUR1152

## 2010-12-18 LAB — CBC
HCT: 31.3 % — ABNORMAL LOW (ref 39.0–52.0)
Hemoglobin: 10.4 g/dL — ABNORMAL LOW (ref 13.0–17.0)
MCH: 28.7 pg (ref 26.0–34.0)
MCHC: 33.2 g/dL (ref 30.0–36.0)
MCV: 86.5 fL (ref 78.0–100.0)
Platelets: 146 10*3/uL — ABNORMAL LOW (ref 150–400)
RBC: 3.62 MIL/uL — ABNORMAL LOW (ref 4.22–5.81)
RDW: 12.6 % (ref 11.5–15.5)
WBC: 6.7 10*3/uL (ref 4.0–10.5)

## 2010-12-18 LAB — BASIC METABOLIC PANEL
CO2: 26 mEq/L (ref 19–32)
Calcium: 9.1 mg/dL (ref 8.4–10.5)
Creatinine, Ser: 1.09 mg/dL (ref 0.50–1.35)
GFR calc non Af Amer: 56 mL/min — ABNORMAL LOW (ref >90)
Glucose, Bld: 106 mg/dL — ABNORMAL HIGH (ref 70–99)
Sodium: 136 mEq/L (ref 135–145)

## 2010-12-18 LAB — MAGNESIUM: Magnesium: 1.8 mg/dL (ref 1.5–2.5)

## 2010-12-18 LAB — APTT: aPTT: 38 seconds — ABNORMAL HIGH (ref 24–37)

## 2010-12-18 LAB — PROTIME-INR: Prothrombin Time: 14.8 seconds (ref 11.6–15.2)

## 2010-12-19 LAB — BASIC METABOLIC PANEL
BUN: 23 mg/dL (ref 6–23)
Calcium: 8.8 mg/dL (ref 8.4–10.5)
Chloride: 102 mEq/L (ref 96–112)
Creatinine, Ser: 1.03 mg/dL (ref 0.50–1.35)
GFR calc Af Amer: 69 mL/min — ABNORMAL LOW (ref >90)
GFR calc non Af Amer: 60 mL/min — ABNORMAL LOW (ref >90)

## 2010-12-19 LAB — CBC
HCT: 28 % — ABNORMAL LOW (ref 39.0–52.0)
MCHC: 33.9 g/dL (ref 30.0–36.0)
MCV: 86.2 fL (ref 78.0–100.0)
Platelets: 141 10*3/uL — ABNORMAL LOW (ref 150–400)
RDW: 12.6 % (ref 11.5–15.5)
WBC: 7 10*3/uL (ref 4.0–10.5)

## 2010-12-20 LAB — BASIC METABOLIC PANEL
BUN: 19 mg/dL (ref 6–23)
Chloride: 103 mEq/L (ref 96–112)
GFR calc Af Amer: 64 mL/min — ABNORMAL LOW (ref >90)
GFR calc non Af Amer: 56 mL/min — ABNORMAL LOW (ref >90)
Potassium: 4.4 mEq/L (ref 3.5–5.1)
Sodium: 135 mEq/L (ref 135–145)

## 2010-12-20 LAB — CBC
MCHC: 33.9 g/dL (ref 30.0–36.0)
Platelets: 128 10*3/uL — ABNORMAL LOW (ref 150–400)
RDW: 12.5 % (ref 11.5–15.5)
WBC: 8.2 10*3/uL (ref 4.0–10.5)

## 2010-12-20 NOTE — Discharge Summary (Addendum)
NAMEKENZEL, RUESCH             ACCOUNT NO.:  [id number]  MEDICAL RECORD NO.:  [id number]  LOCATION:  2004                         FACILITY:  [facility]  PHYSICIAN:  Quita Skye. Hart Rochester, M.D.  DATE OF BIRTH:  05/17/1913  DATE OF ADMISSION:  12/18/2010 DATE OF DISCHARGE:  12/20/2010                              DISCHARGE SUMMARY   CHIEF COMPLAINT:  Abdominal aortic aneurysm.  HISTORY OF PRESENT ILLNESS:  Mr. Jagoda is a 77 year old gentleman who is referred by Dr. Bosie Clos with a large abdominal aortic aneurysm in January 2012.  We followed this in the office and that demonstrated too enlarge between 6 and 7 cm in size.  After looking at the CT scan, it was amenable to an endovascular stent graft and with long discussion with the patient and his son.  They decided to proceed with the aortic stent grafting.  He was admitted for this procedure.  CHRONIC MEDICAL PROBLEMS: 1. Hypertension. 2. Hyperlipidemia. 3. BPH. 4. Gastroesophageal reflux. 5. Chronic arthritis. 6. History of chronic GI bleeding, not present.  HOSPITAL COURSE:  The patient was taken to the operating room on December 18, 2010 for an endovascular repair of abdominal aortic aneurysm.  The main body was 31 x 14 x 17 cm contralateral limb, on the right was 14 x 12.  He had percutaneous access ProGlide sutures and he had a completion arteriogram.  Postoperatively, the patient did very well.  He had 3+ DP pulses, which were palpable.  He was alert and oriented x3.  He is ambulating, voiding and taking p.o. without difficulty.  His wounds are healing well without hematoma and he was discharged to home.  DISCHARGE MEDICATIONS:  Tylenol 1-2 tabs every 4 hours as needed for temp greater than 101 or pain.  1. He also from the nursing home had prescriptions of Tylenol/codeine     300 mg/30 one tablet every 6 hours as needed for pain. 2. Alprazolam 0.5 mg half to 1 tab daily at bedtime for sleep as     needed. 3. Aspirin 81 mg  daily. 4. Finasteride 5 mg daily. 5. Flax seed oil 1 tablet daily. 6. Lasix 10 mg every other day. 7. Levothyroxine 50 mcg daily. 8. Losartan 25 mg daily. 9. Meclizine 3 times a day as needed for vertigo 25 mg. 10.Metoprolol 25 mg half tablet twice daily. 11.Ocuvite every morning. 12.Oxybutynin XL 10 mg every morning. 13.Phenergan 12.5 mg 1 tablet by mouth as needed for nausea for 6     hours. 14.MiraLAX 17 g every evening. 15.Protonix 40 mg daily. 16.Omega over the counter 1 capsule daily. 17.Senokot 2 tablets every other day. 18.Sertraline 50 mg daily. 19.Vitamin D daily.  FINAL DIAGNOSIS: 1. Abdominal aortic aneurysm. 2. Status post endovascular repair of abdominal aortic aneurysm. 3. All his medical issues while in-house were stable and well     controlled on his present medicine regimen.  DISPOSITION:  He is discharged to home.  He will follow up in our office in 4 weeks with a CTA of the abdomen and pelvis.  Our office will call with the appointment.     Della Goo, PA-C   ______________________________ [name], M.D.  RR/MEDQ  D:  12/20/2010  T:  12/20/2010  Job:  [id number]  Electronically Signed by Della Goo PA on 12/20/2010 02:49:06 PM Electronically Signed by Josephina Gip M.D. on 12/30/2010 10:07:42 AM

## 2010-12-23 LAB — CROSSMATCH: Unit division: 0

## 2010-12-24 DIAGNOSIS — I498 Other specified cardiac arrhythmias: Secondary | ICD-10-CM

## 2010-12-24 DIAGNOSIS — M545 Low back pain, unspecified: Secondary | ICD-10-CM

## 2010-12-24 HISTORY — DX: Low back pain, unspecified: M54.50

## 2010-12-24 HISTORY — DX: Other specified cardiac arrhythmias: I49.8

## 2010-12-26 ENCOUNTER — Other Ambulatory Visit: Payer: Self-pay

## 2010-12-26 DIAGNOSIS — I714 Abdominal aortic aneurysm, without rupture: Secondary | ICD-10-CM

## 2010-12-30 NOTE — Op Note (Signed)
NAMEGERALDINE, Ricardo [name]             ACCOUNT NO.:  [id number]  MEDICAL RECORD NO.:  [id number]  LOCATION:  2899                         FACILITY:  [facility]  PHYSICIAN:  Quita Skye. Hart Rochester, M.D.  DATE OF BIRTH:  Aug 07, 1913  DATE OF PROCEDURE:  12/18/2010 DATE OF DISCHARGE:                              OPERATIVE REPORT   PREOPERATIVE DIAGNOSIS:  Large infrarenal abdominal aortic aneurysm.  POSTOPERATIVE DIAGNOSIS:  Large infrarenal abdominal aortic aneurysm.  PROCEDURE: 1. Bilateral percutaneous femoral access and closure using ProGlide     system. 2. Insertion of an aorto-bilaterally-common iliac Gore - Excluder - C3     stent graft using:     a.     Main body 31 mm x 14 mm x 17 cm, primary left.     b.     14 mm x 12 cm contralateral limb - right. 3. Completion angiography. 4. Repair of femoral artery using ProGlide system.  SURGEONS: 1. Quita Skye. Hart Rochester, M.D. 2. Juleen China IV, MD  ANESTHESIA:  General endotracheal.  PROCEDURE IN DETAIL:  The patient was taken to the operating room and placed in supine position, at which time satisfactory general endotracheal anesthesia was administered.  Abdomen and groins were prepped, Betadine scrub and solution, draped in a routine sterile manner.  Using ultrasound guidance, both common femoral arteries were entered percutaneously.  Guidewire passed into the aneurysm sac.  After dilating the tract, two broke ProGlide suture systems were utilized on each side one at the 11 o'clock position and one at the 1 o'clock position for closure at the completion of the procedure.  Following this, a long 8 sheath was placed on the right side and a short 8 sheath placed on the left side.  The pigtail catheter was positioned in the suprarenal aorta via the right side.  Using a catheter exchange, the Amplatz wire was placed up the left side and a 20-French sheath was placed over the guidewire.  The main body of the graft which was deployed through  the left side was a 31 mm x 14 mm x 17 cm Gore - Excluder - C3 system.  It was then positioned appropriately and an angiogram was performed through the pigtail catheter using 15 mL of contrast at 15 mL/second.  This revealed the right renal artery to be widely patent but no flow to the left kidney.  The preoperative CT angiogram had revealed significant plaque at the origin of the left renal artery but there was some filling on the CT angiogram.  Additional view was performed to see if any flow was noted.  It was then attempted to cannulate the origin of the left renal artery using the Motarjeme catheter to see if the stent could be placed in the origin of the left renal artery but this was unsuccessful.  The patient was having excellent urinary output throughout this entire time.  Therefore, the graft was deployed just distal to the right renal artery, the 31 x 14 x 17 cm graft.  It was deployed down to the contralateral gate.  The pigtail catheter was then removed over an Amplatz wire and a 12-French DrySeal sheath was placed over the Amplatz wire.  Using a buddy wire system, the contralateral gate was cannulated using a Bentson wire and a Kumpe catheter.  When it was confirmed that the gate had been cannulated accurately using the Omni flush catheter, a retrograde study was performed through the sheath to measure the appropriate length of the graft.  A 14 mm x 12 cm graft was used for the contralateral right limb. This was deployed appropriately and the remainder of the main body was deployed.  All junctions were then dilated using a Q30 balloon and completion angiogram was performed which revealed no evidence of endoleaks and excellent position of the grafts with good filling of both hypogastrics.  There appeared to be some flow into the left renal artery although that was not definitive but there was excellent flow in the right renal artery and the patient continued to have excellent  urinary output.  The sheaths were then removed and the femoral artery arteriotomies repaired with the ProGlide sutures, adequate hemostasis was present, the guidewire was removed, and 50 mg of protamine was given to reverse the heparin, which had been given after insertion of the sheaths at the beginning of the procedure.  Adequate hemostasis was achieved and wounds closed with a simple 3-0 Vicryl subcuticular suture. Sterile dressing applied and the patient was taken to recovery room in stable condition.  He was stable hemodynamically throughout the procedure and had excellent urinary output.     [name], M.D.     JDL/MEDQ  D:  12/18/2010  T:  12/18/2010  Job:  [id number]  Electronically Signed by Josephina Gip M.D. on 12/30/2010 10:07:46 AM

## 2010-12-30 NOTE — H&P (Signed)
  NAMEKANNON, Ricardo [name]             ACCOUNT NO.:  [id number]  MEDICAL RECORD NO.:  [id number]  LOCATION:  2899                         FACILITY:  [facility]  PHYSICIAN:  Quita Skye. Hart Rochester, M.D.  DATE OF BIRTH:  1913/06/03  DATE OF ADMISSION:  12/18/2010 DATE OF DISCHARGE:                             HISTORY & PHYSICAL   PREOPERATIVE DIAGNOSIS:  Large infrarenal abdominal aortic aneurysm.  PRESENT ILLNESS:  This 77 year old male patient was referred by Dr. Lajoyce Corners with a large abdominal aortic aneurysm in January 2012.  We have followed up this in the office and it has been demonstrated to enlarge between 6 and 7 cm in size.  After a long discussion with the family and recommendation of following this aneurysm in the future with CT scans, the patient and his son desired to proceed with aortic stent grafting. The CT angiogram had been performed and it appears that he is a reasonable candidate for an aortic stent graft.  Therefore, he will be admitted for this procedure.  CHRONIC MEDICAL PROBLEMS: 1. Hypertension. 2. Hyperlipidemia. 3. Benign prostatic hypertrophy. 4. Gastroesophageal reflux disease. 5. Chronic arthritis. 6. History of a chronic gastrointestinal bleed, but not active     currently.  SOCIAL HISTORY:  The patient is widowed, lives at friend's home, has one child.  Does not use tobacco, alcohol.  FAMILY HISTORY:  Negative for coronary artery disease, diabetes, or stroke.  REVIEW OF SYSTEMS:  Positive for decreased hearing, wears a hearing aid. Has some leg discomfort with ambulation, arthritis, left hip pain, joint pain, muscle pain, but denies chest pain or dyspnea on exertion.  Does have reflux esophagitis and hiatal hernia.  All other systems are negative.  PHYSICAL EXAMINATION:  VITAL SIGNS:  Blood pressure 130/70, heart rate is  57, respirations 16. GENERAL:  This is an elderly man who is in no apparent distress, alert and oriented x3. HEENT:  Normal for age.   EOM is intact. LUNGS:  Clear.  No rhonchi or wheezing. CARDIOVASCULAR:  Regular rhythm.  No murmurs.  Carotid pulse are 3+, no audible bruits. ABDOMEN:  Soft with a large pulsatile mass approximating 6-7 cm. MUSCULOSKELETAL:  Free of major deformities. NEUROLOGIC:  Normal. SKIN:  Free of rashes. EXTREMITIES:  Lower extremity exam reveals 3+ femoral, popliteal, and posterior tibial pulses palpable bilaterally.  The patient did have a negative Cardiolite exam for cardiac clearance.  IMPRESSION:  Large infrarenal abdominal aortic aneurysm.  PLAN:  Admit the patient on December 18, 2010, for aortic stent grafting. Risks and benefits have been thoroughly discussed with the patient and his son and they would like to proceed.     [name], M.D.     JDL/MEDQ  D:  12/18/2010  T:  12/18/2010  Job:  [id number]  Electronically Signed by Josephina Gip M.D. on 12/30/2010 10:07:44 AM

## 2011-01-03 DIAGNOSIS — R220 Localized swelling, mass and lump, head: Secondary | ICD-10-CM

## 2011-01-03 HISTORY — DX: Localized swelling, mass and lump, head: R22.0

## 2011-01-07 NOTE — Op Note (Signed)
  [name], [name]             ACCOUNT NO.:  [id number]  MEDICAL RECORD NO.:  [id number]  LOCATION:                                 FACILITY:  PHYSICIAN:  Juleen China IV, MDDATE OF BIRTH:  12-18-1913  DATE OF PROCEDURE:  12/18/2010 DATE OF DISCHARGE:                              OPERATIVE REPORT   PREOPERATIVE DIAGNOSIS:  Abdominal aortic aneurysm.  POSTOPERATIVE DIAGNOSIS:  Abdominal aortic aneurysm.  PROCEDURE PERFORMED:  Percutaneous endovascular aneurysm repair.  SURGEON:  Quita Skye. Hart Rochester, MD.  CO-SURGEON: 1. Charlena Cross, MD  Please see Dr. Candie Chroman operative note for full details of the procedure.  We performed bilateral percutaneous access and then deployed a [employer] device with being a primary left-sided device with extension on the right.  I assisted in the strategic planning of the case as well as deployment of the contralateral limb and aiding with arterial access and closure.     [name], MD     VWB/MEDQ  D:  12/20/2010  T:  12/20/2010  Job:  [id number]  Electronically Signed by Arelia Longest IV MD on 01/07/2011 09:46:52 PM

## 2011-01-10 ENCOUNTER — Other Ambulatory Visit: Payer: Self-pay | Admitting: Internal Medicine

## 2011-01-10 DIAGNOSIS — R52 Pain, unspecified: Secondary | ICD-10-CM

## 2011-01-16 ENCOUNTER — Inpatient Hospital Stay (HOSPITAL_COMMUNITY)
Admission: RE | Admit: 2011-01-16 | Discharge: 2011-01-16 | Payer: Medicare PPO | Source: Ambulatory Visit | Attending: Internal Medicine | Admitting: Internal Medicine

## 2011-01-20 ENCOUNTER — Encounter: Payer: Self-pay | Admitting: Vascular Surgery

## 2011-01-21 ENCOUNTER — Ambulatory Visit (INDEPENDENT_AMBULATORY_CARE_PROVIDER_SITE_OTHER): Payer: Medicare PPO | Admitting: Vascular Surgery

## 2011-01-21 ENCOUNTER — Encounter: Payer: Self-pay | Admitting: Vascular Surgery

## 2011-01-21 ENCOUNTER — Ambulatory Visit
Admission: RE | Admit: 2011-01-21 | Discharge: 2011-01-21 | Disposition: A | Discharge status: Home / Self Care | Payer: Medicare PPO | Source: Ambulatory Visit | Attending: Vascular Surgery | Admitting: Vascular Surgery

## 2011-01-21 VITALS — BP 122/68 | HR 65 | Ht 68.5 in | Wt 159.0 lb

## 2011-01-21 DIAGNOSIS — I714 Abdominal aortic aneurysm, without rupture: Secondary | ICD-10-CM

## 2011-01-21 MED ORDER — IOHEXOL 350 MG/ML SOLN: 100.0000 mL | Freq: Once | INTRAVENOUS | Status: AC | PRN

## 2011-01-21 NOTE — Progress Notes (Signed)
Subjective:     Patient ID: Ricardo [name], male   DOB: 03/23/1913, 77 y.o.   MRN: [id number]  HPI this 77 year old male patient returns for initial followup regarding his recent aorto by common iliac stent graft for a large infrarenal abdominal aortic aneurysm. He is accompanied by his son. The patient is very hard of hearing. His hearing aid is not working well today. He denies any abdominal or back pain. He has had no chest pain or dyspnea on exertion. He does not ambulate much at the retirement. He has been making adequate amounts of urine. He is eating well and generally feeling well. Past Medical History  Diagnosis Date  . Hypertension   . Hyperlipidemia   . Vertigo   . Hypothyroidism   . Fall      4x  . GERD (gastroesophageal reflux disease)   . Nausea & vomiting   . Dehydration   . Anemia   . Hyponatremia   . Hypercalcemia   . AAA (abdominal aortic aneurysm)   . Renal failure, acute   . Infectious colitis   . Fecal impaction   . Hiatal hernia   . Hiatal hernia   . Arthritis   . Joint pain   . Ulcer     Stomach  . Leg pain     with walking    History  Substance Use Topics  . Smoking status: Never Smoker   . Smokeless tobacco: Never Used  . Alcohol Use: No    Family History  Problem Relation Age of Onset  . Heart failure Brother     No Known Allergies  Current outpatient prescriptions:ACETAMINOPHEN PO, Take by mouth.  , Disp: , Rfl: ;  ALPRAZolam (XANAX) 0.25 MG tablet, Take 0.5 mg by mouth at bedtime as needed.  , Disp: , Rfl: ;  aspirin 81 MG tablet, Take 81 mg by mouth daily.  , Disp: , Rfl: ;  cholecalciferol (VITAMIN D) 1000 UNITS tablet, Take 2,000 Units by mouth 3 (three) times daily.  , Disp: , Rfl: ;  fish oil-omega-3 fatty acids 1000 MG capsule, Take by mouth daily.  , Disp: , Rfl:  Flaxseed, Linseed, (FLAXSEED OIL PO), Take by mouth daily.  , Disp: , Rfl: ;  lisinopril (PRINIVIL,ZESTRIL) 20 MG tablet, Take 20 mg by mouth daily.  , Disp: , Rfl: ;   meclizine (ANTIVERT) 25 MG tablet, Take 25 mg by mouth 3 (three) times daily as needed.  , Disp: , Rfl: ;  metoprolol tartrate (LOPRESSOR) 25 MG tablet, Take 12.5 mg by mouth 2 (two) times daily.  , Disp: , Rfl:  Nutritional Supplements (ENSURE PO), Take by mouth 3 (three) times daily.  , Disp: , Rfl: ;  pantoprazole (PROTONIX) 40 MG tablet, Take 40 mg by mouth 2 (two) times daily.  , Disp: , Rfl: ;  pregabalin (LYRICA) 75 MG capsule, Take 75 mg by mouth as needed.  , Disp: , Rfl: ;  promethazine (PHENERGAN) 12.5 MG tablet, Take 12.5 mg by mouth every 6 (six) hours as needed.  , Disp: , Rfl:  sertraline (ZOLOFT) 50 MG tablet, Take 50 mg by mouth daily.  , Disp: , Rfl: ;  simvastatin (ZOCOR) 40 MG tablet, Take 40 mg by mouth at bedtime.  , Disp: , Rfl:  No current facility-administered medications for this visit. Facility-Administered Medications Ordered in Other Visits: iohexol (OMNIPAQUE) 350 MG/ML injection 100 mL, 100 mL, Intravenous, Once PRN, Medication Radiologist  BP 122/68  Pulse 65  Ht  5' 8.5" (1.74 m)  Wt 159 lb (72.122 kg)  BMI 23.82 kg/m2  SpO2 98%  Body mass index is 23.82 kg/(m^2).         Review of Systems     Objective:   Physical Exam blood pressure 120/68 heart rate 65 respirations 14 General he is an elderly male who is in no apparent stress alert and oriented x3 Abdomen is soft nontender no pulsatile mass is palpable Extremities 3+ femoral and popliteal pulses are palpable bilaterally both feet are well perfused   today I ordered a CT angiogram which are reviewed by computer. The neck of the aneurysm is quite angulated and ectatic. There is incomplete apposition of the graft to the left lateral wall near the left renal artery orifice which results in some pooling of contrast in this area. The graft distal to this however is in good apposition to the wall and there is no evidence of a type I endoleak. There is a small type II endoleak from a lumbar branch at the  aortic bifurcation. The diameter of the aneurysm sac is approximately 57 mm about the same as the preoperative measurements.     Assessment:     Successful aortic stent graft for large aneurysm in this 77 year old patient. Incomplete apposition of the graft to left lateral wall near left renal artery orifice and small type II endoleak from lumbar branch    Plan:     Return in 3 months with CT angiogram to monitor of findings

## 2011-01-22 NOTE — Progress Notes (Signed)
Addended by: Sharee Pimple on: 01/22/2011 12:54 PM   Modules accepted: Orders

## 2011-05-27 ENCOUNTER — Ambulatory Visit: Payer: Medicare PPO | Admitting: Vascular Surgery

## 2011-05-27 ENCOUNTER — Other Ambulatory Visit: Payer: Medicare PPO

## 2011-07-24 ENCOUNTER — Other Ambulatory Visit: Payer: Self-pay | Admitting: Vascular Surgery

## 2011-07-24 LAB — BUN: BUN: 34 mg/dL — ABNORMAL HIGH (ref 6–23)

## 2011-07-24 LAB — CREATININE, SERUM: Creat: 1.52 mg/dL — ABNORMAL HIGH (ref 0.50–1.10)

## 2011-07-28 ENCOUNTER — Encounter: Payer: Self-pay | Admitting: Vascular Surgery

## 2011-07-29 ENCOUNTER — Ambulatory Visit
Admission: RE | Admit: 2011-07-29 | Discharge: 2011-07-29 | Disposition: A | Discharge status: Home / Self Care | Payer: Medicare PPO | Source: Ambulatory Visit | Attending: Vascular Surgery | Admitting: Vascular Surgery

## 2011-07-29 ENCOUNTER — Encounter: Payer: Self-pay | Admitting: Vascular Surgery

## 2011-07-29 ENCOUNTER — Ambulatory Visit (INDEPENDENT_AMBULATORY_CARE_PROVIDER_SITE_OTHER): Payer: Medicare PPO | Admitting: Vascular Surgery

## 2011-07-29 VITALS — BP 157/68 | HR 49 | Resp 20 | Ht 69.0 in | Wt 161.0 lb

## 2011-07-29 DIAGNOSIS — I714 Abdominal aortic aneurysm, without rupture: Secondary | ICD-10-CM

## 2011-07-29 DIAGNOSIS — Z48812 Encounter for surgical aftercare following surgery on the circulatory system: Secondary | ICD-10-CM

## 2011-07-29 MED ORDER — IOHEXOL 350 MG/ML SOLN
75.0000 mL | Freq: Once | INTRAVENOUS | Status: AC | PRN
  Administered 2011-07-29: 75 mL via INTRAVENOUS

## 2011-07-29 NOTE — Progress Notes (Addendum)
Subjective:     Patient ID: Ricardo [name], male   DOB: 06-11-1913, 78 y.o.   MRN: [id number]  HPI this 78 year old male returns 4 months post aortic stent grafting of a large abdominal aortic aneurysm. He is totally asymptomatic in that regard. He denies any abdominal or new back symptoms. He does have chronic arthritis. He is ambulating short distances with help as before. He uses a walker.  Past Medical History  Diagnosis Date  . Hypertension   . Hyperlipidemia   . Vertigo   . Hypothyroidism   . Fall      4x  . GERD (gastroesophageal reflux disease)   . Nausea & vomiting   . Dehydration   . Anemia   . Hyponatremia   . Hypercalcemia   . AAA (abdominal aortic aneurysm)   . Renal failure, acute   . Infectious colitis   . Fecal impaction   . Hiatal hernia   . Hiatal hernia   . Arthritis   . Joint pain   . Ulcer     Stomach  . Leg pain     with walking    History  Substance Use Topics  . Smoking status: Never Smoker   . Smokeless tobacco: Never Used  . Alcohol Use: No    Family History  Problem Relation Age of Onset  . Heart failure Brother     No Known Allergies  Current outpatient prescriptions:ACETAMINOPHEN PO, Take by mouth.  , Disp: , Rfl: ;  ALPRAZolam (XANAX) 0.25 MG tablet, Take 0.5 mg by mouth at bedtime as needed.  , Disp: , Rfl: ;  aspirin 81 MG tablet, Take 81 mg by mouth daily.  , Disp: , Rfl: ;  cholecalciferol (VITAMIN D) 1000 UNITS tablet, Take 2,000 Units by mouth 3 (three) times daily.  , Disp: , Rfl: ;  fish oil-omega-3 fatty acids 1000 MG capsule, Take by mouth daily.  , Disp: , Rfl:  Flaxseed, Linseed, (FLAXSEED OIL PO), Take by mouth daily.  , Disp: , Rfl: ;  lisinopril (PRINIVIL,ZESTRIL) 20 MG tablet, Take 20 mg by mouth daily.  , Disp: , Rfl: ;  meclizine (ANTIVERT) 25 MG tablet, Take 25 mg by mouth 3 (three) times daily as needed.  , Disp: , Rfl: ;  metoprolol tartrate (LOPRESSOR) 25 MG tablet, Take 12.5 mg by mouth 2 (two) times daily.  ,  Disp: , Rfl:  Nutritional Supplements (ENSURE PO), Take by mouth 3 (three) times daily.  , Disp: , Rfl: ;  pantoprazole (PROTONIX) 40 MG tablet, Take 40 mg by mouth 2 (two) times daily.  , Disp: , Rfl: ;  pregabalin (LYRICA) 75 MG capsule, Take 75 mg by mouth as needed.  , Disp: , Rfl: ;  promethazine (PHENERGAN) 12.5 MG tablet, Take 12.5 mg by mouth every 6 (six) hours as needed.  , Disp: , Rfl:  sertraline (ZOLOFT) 50 MG tablet, Take 50 mg by mouth daily.  , Disp: , Rfl: ;  simvastatin (ZOCOR) 40 MG tablet, Take 40 mg by mouth at bedtime.  , Disp: , Rfl:  No current facility-administered medications for this visit. Facility-Administered Medications Ordered in Other Visits: iohexol (OMNIPAQUE) 350 MG/ML injection 75 mL, 75 mL, Intravenous, Once PRN, Cristi Loron, MD, 75 mL at 07/29/11 0954  BP 157/68  Pulse 49  Resp 20  Ht 5\' 9"  (1.753 m)  Wt 161 lb (73.029 kg)  BMI 23.78 kg/m2  Body mass index is 23.78 kg/(m^2).  Review of Systems denies chest pain, dyspnea on exertion, PND, orthopnea. Has very poor hearing. Has chronic back and joint pain.    Objective:   Physical Exam blood pressure 157/68 heart rate 49 respirations 20 Gen.-alert and oriented x3 in no apparent distress HEENT normal for age Lungs no rhonchi or wheezing Cardiovascular regular rhythm no murmurs carotid pulses 3+ palpable no bruits audible Abdomen soft nontender no palpable masses-no pulsatile mass noted Musculoskeletal free of  major deformities Skin clear -no rashes Neurologic normal Lower extremities 3+ femoral and dorsalis pedis pulses palpable bilaterally with no edema   Today I ordered a CT angiogram which are reviewed by computer. Also compared this to the CT angiogram performed in November of 2012 There is a small type II endoleak in the distal aspect of the aneurysm sac. This was noted on the previous study.  there is also a small type I-a endoleak which in retrospect may have been present  on the previous study but was not noted by the radiologist. Today I do believe there is a small type IA endoleak. The aneurysm diameter is similar now 5.9 previously 5.7      Assessment:     7 months post aortic stent grafting of large abdominal aortic aneurysm in 78 year old gentleman with type II endoleak and newly discovered small type IA endoleak-asymptomatic    Plan:     I discussed this situation with the patient's family. He will return in 6 months with a repeat CT angiogram to see if this leak persists or if the aneurysm sac enlarges. If so we'll consider angiography to further evaluate this leak. Patient's family understands that because of endoleak aneurysm rupture is always a possibility. Because of his elderly age and trying to limit studies on this gentleman and family agrees     I had Dr. Myra Gianotti and Trena Platt review the CTA. Both feel that this is not 100% a type I endoleak. There is calcification in this area. The blush of contrast in the proximal portion of the aneurysm sac could be tracking up from the significant type II endoleak distally. It is definitely not straightforward. We'll continue plan of repeating CTA in 6 months to look at aneurysm sac size and whether these endoleak's or more straightforward

## 2011-07-30 NOTE — Progress Notes (Signed)
Addended by: Sharee Pimple on: 07/30/2011 10:05 AM   Modules accepted: Orders

## 2012-01-23 ENCOUNTER — Other Ambulatory Visit: Payer: Self-pay | Admitting: Vascular Surgery

## 2012-01-24 LAB — CREATININE, SERUM: Creat: 1.53 mg/dL — ABNORMAL HIGH (ref 0.50–1.10)

## 2012-01-24 LAB — BUN: BUN: 41 mg/dL — ABNORMAL HIGH (ref 6–23)

## 2012-01-26 ENCOUNTER — Encounter: Payer: Self-pay | Admitting: Neurosurgery

## 2012-01-27 ENCOUNTER — Ambulatory Visit (INDEPENDENT_AMBULATORY_CARE_PROVIDER_SITE_OTHER): Payer: Medicare PPO | Admitting: Vascular Surgery

## 2012-01-27 ENCOUNTER — Ambulatory Visit
Admission: RE | Admit: 2012-01-27 | Discharge: 2012-01-27 | Disposition: A | Discharge status: Home / Self Care | Payer: Medicare PPO | Source: Ambulatory Visit | Attending: Vascular Surgery | Admitting: Vascular Surgery

## 2012-01-27 ENCOUNTER — Encounter: Payer: Self-pay | Admitting: Vascular Surgery

## 2012-01-27 VITALS — BP 155/76 | HR 62 | Resp 18 | Ht 68.5 in | Wt 161.0 lb

## 2012-01-27 DIAGNOSIS — Z48812 Encounter for surgical aftercare following surgery on the circulatory system: Secondary | ICD-10-CM

## 2012-01-27 DIAGNOSIS — I714 Abdominal aortic aneurysm, without rupture: Secondary | ICD-10-CM

## 2012-01-27 MED ORDER — IOHEXOL 350 MG/ML SOLN
60.0000 mL | Freq: Once | INTRAVENOUS | Status: AC | PRN
  Administered 2012-01-27: 60 mL via INTRAVENOUS

## 2012-01-27 NOTE — Progress Notes (Signed)
Subjective:     Patient ID: Ricardo [name], male   DOB: Nov 16, 1913, 78 y.o.   MRN: [id number]  HPI this 78 year old male returns 1 year post stent graft repair of large abdominal aortic aneurysm. I last saw him in May of 2013. CT angiogram at that time revealed some incomplete apposition of the proximal portion of the graft because of a large infrarenal neck and tortuosity with good apposition distal to this point. There was a definite type II endoleak and a possible type IA endoleak although this was unclear. The films were reviewed by myself, Dr. Myra Gianotti, and Trena Platt. Patient returns today withspecific complaints other than vertigo and arthritis.  Past Medical History  Diagnosis Date  . Hypertension   . Hyperlipidemia   . Vertigo   . Hypothyroidism   . Fall      4x  . GERD (gastroesophageal reflux disease)   . Nausea & vomiting   . Dehydration   . Anemia   . Hyponatremia   . Hypercalcemia   . AAA (abdominal aortic aneurysm)   . Renal failure, acute   . Infectious colitis   . Fecal impaction   . Hiatal hernia   . Hiatal hernia   . Arthritis   . Joint pain   . Ulcer     Stomach  . Leg pain     with walking    History  Substance Use Topics  . Smoking status: Never Smoker   . Smokeless tobacco: Never Used  . Alcohol Use: No    Family History  Problem Relation Age of Onset  . Heart failure Brother     No Known Allergies  Current outpatient prescriptions:ACETAMINOPHEN PO, Take by mouth.  , Disp: , Rfl: ;  ALPRAZolam (XANAX) 0.25 MG tablet, Take 0.5 mg by mouth at bedtime as needed.  , Disp: , Rfl: ;  aspirin 81 MG tablet, Take 81 mg by mouth daily.  , Disp: , Rfl: ;  cholecalciferol (VITAMIN D) 1000 UNITS tablet, Take 2,000 Units by mouth 3 (three) times daily.  , Disp: , Rfl: ;  fish oil-omega-3 fatty acids 1000 MG capsule, Take by mouth daily.  , Disp: , Rfl:  Flaxseed, Linseed, (FLAXSEED OIL PO), Take by mouth daily.  , Disp: , Rfl: ;  lisinopril  (PRINIVIL,ZESTRIL) 20 MG tablet, Take 20 mg by mouth daily.  , Disp: , Rfl: ;  meclizine (ANTIVERT) 25 MG tablet, Take 25 mg by mouth 3 (three) times daily as needed.  , Disp: , Rfl: ;  metoprolol tartrate (LOPRESSOR) 25 MG tablet, Take 12.5 mg by mouth 2 (two) times daily.  , Disp: , Rfl:  Nutritional Supplements (ENSURE PO), Take by mouth 3 (three) times daily.  , Disp: , Rfl: ;  pantoprazole (PROTONIX) 40 MG tablet, Take 40 mg by mouth 2 (two) times daily.  , Disp: , Rfl: ;  pregabalin (LYRICA) 75 MG capsule, Take 75 mg by mouth as needed.  , Disp: , Rfl: ;  promethazine (PHENERGAN) 12.5 MG tablet, Take 12.5 mg by mouth every 6 (six) hours as needed.  , Disp: , Rfl:  sertraline (ZOLOFT) 50 MG tablet, Take 50 mg by mouth daily.  , Disp: , Rfl: ;  simvastatin (ZOCOR) 40 MG tablet, Take 40 mg by mouth at bedtime.  , Disp: , Rfl:  No current facility-administered medications for this visit. Facility-Administered Medications Ordered in Other Visits: [COMPLETED] iohexol (OMNIPAQUE) 350 MG/ML injection 60 mL, 60 mL, Intravenous, Once PRN, Demetria Pore  Gallerani, MD, 60 mL at 01/27/12 1036  BP 155/76  Pulse 62  Resp 18  Ht 5' 8.5" (1.74 m)  Wt 161 lb (73.029 kg)  BMI 24.12 kg/m2  Body mass index is 24.12 kg/(m^2).          Review of Systems ambulates with walker complains of dizziness no syncope. Denies chest pain. Continues to remain fairly active for his age.    Objective:   Physical Exam blood pressure 105/76 heart rate 62 respirations 18 Gen.-alert and oriented x3 in no apparent distress-ambulates with walker  HEENT normal for age Lungs no rhonchi or wheezing Cardiovascular regular rhythm no murmurs carotid pulses 3+ palpable no bruits audible Abdomen soft nontender no palpable masses-no pulsatile mass noted Musculoskeletal free of  major deformities Skin clear -no rashes Neurologic normal Lower extremities 3+ femoral and dorsalis pedis pulses palpable bilaterally with no edema  Today  I ordered a CT angiogram of the abdomen and pelvis which I reviewed by computer. I compared this to the previous study performed in May. There is no change in the study. The maximum diameter of the aneurysm sac continues to be around 57-58 mm which is unchanged. There continues to be a type II endoleak an incomplete apposition of the proximal portion of the graft with some filling of the proximal neck from the left renal artery but good apposition distal to this point.     Assessment:     78 year old status post Ivor large AAA with type II endoleak Possible type IA endoleak although no change in sight diameter or appearance in past 6 months    Plan:     Return in 6 months with CT angiogram abdomen and pelvis for continued follow

## 2012-01-28 ENCOUNTER — Encounter: Payer: Self-pay | Admitting: Vascular Surgery

## 2012-01-28 NOTE — Addendum Note (Signed)
Addended by: Sharee Pimple on: 01/28/2012 11:20 AM   Modules accepted: Orders

## 2012-04-26 LAB — BASIC METABOLIC PANEL
BUN: 39 mg/dL — AB (ref 4–21)
Creatinine: 1.4 mg/dL — AB (ref 0.6–1.3)
Glucose: 86 mg/dL
Potassium: 4.7 mmol/L (ref 3.4–5.3)
Sodium: 135 mmol/L — AB (ref 137–147)

## 2012-04-26 LAB — HEPATIC FUNCTION PANEL
ALT: 8 U/L — AB (ref 10–40)
Alkaline Phosphatase: 85 U/L (ref 25–125)

## 2012-04-26 LAB — CBC AND DIFFERENTIAL: HCT: 33 % — AB (ref 41–53)

## 2012-04-26 LAB — TSH: TSH: 2.64 u[IU]/mL (ref 0.41–5.90)

## 2012-05-28 ENCOUNTER — Other Ambulatory Visit: Payer: Self-pay | Admitting: *Deleted

## 2012-05-28 MED ORDER — OXYCODONE HCL 5 MG PO TABA: 1.0000 | ORAL_TABLET | Freq: Three times a day (TID) | ORAL | Status: DC | PRN

## 2012-07-26 ENCOUNTER — Encounter: Payer: Self-pay | Admitting: Vascular Surgery

## 2012-07-27 ENCOUNTER — Ambulatory Visit (INDEPENDENT_AMBULATORY_CARE_PROVIDER_SITE_OTHER): Payer: Medicare PPO | Admitting: Vascular Surgery

## 2012-07-27 ENCOUNTER — Encounter: Payer: Self-pay | Admitting: Vascular Surgery

## 2012-07-27 ENCOUNTER — Ambulatory Visit
Admission: RE | Admit: 2012-07-27 | Discharge: 2012-07-27 | Disposition: A | Discharge status: Home / Self Care | Payer: Medicare PPO | Source: Ambulatory Visit | Attending: Vascular Surgery | Admitting: Vascular Surgery

## 2012-07-27 VITALS — BP 117/62 | HR 76 | Resp 18 | Ht 70.0 in | Wt 165.0 lb

## 2012-07-27 DIAGNOSIS — I714 Abdominal aortic aneurysm, without rupture: Secondary | ICD-10-CM

## 2012-07-27 DIAGNOSIS — Z48812 Encounter for surgical aftercare following surgery on the circulatory system: Secondary | ICD-10-CM

## 2012-07-27 MED ORDER — IOHEXOL 350 MG/ML SOLN
100.0000 mL | Freq: Once | INTRAVENOUS | Status: AC | PRN
  Administered 2012-07-27: 100 mL via INTRAVENOUS

## 2012-07-27 NOTE — Addendum Note (Signed)
Addended by: Sharee Pimple on: 07/27/2012 01:59 PM   Modules accepted: Orders

## 2012-07-27 NOTE — Progress Notes (Signed)
Subjective:     Patient ID: Ricardo [name], male   DOB: [birth year], 79 y.o.   MRN: [id number]  HPI this 79 year old male returns for continued followup regarding his aortic stent graft which was placed last year for a large infrarenal aortic aneurysm with a very complex neck. He has not complained of any abdominal or back symptoms at this time. He continues to ambulate with a walker and lives at friend's home. He states that he is considering-retiring. He still works with his real estate business. He has had some mild chest discomfort on occasion.  Past Medical History  Diagnosis Date  . Hypertension   . Hyperlipidemia   . Vertigo   . Hypothyroidism   . Fall      4x  . GERD (gastroesophageal reflux disease)   . Nausea & vomiting   . Dehydration   . Anemia   . Hyponatremia   . Hypercalcemia   . AAA (abdominal aortic aneurysm)   . Renal failure, acute   . Infectious colitis   . Fecal impaction   . Hiatal hernia   . Hiatal hernia   . Arthritis   . Joint pain   . Ulcer     Stomach  . Leg pain     with walking    History  Substance Use Topics  . Smoking status: Never Smoker   . Smokeless tobacco: Never Used  . Alcohol Use: No    Family History  Problem Relation Age of Onset  . Heart failure Brother     No Known Allergies  Current outpatient prescriptions:ACETAMINOPHEN PO, Take by mouth.  , Disp: , Rfl: ;  ALPRAZolam (XANAX) 0.25 MG tablet, Take 0.5 mg by mouth at bedtime as needed.  , Disp: , Rfl: ;  aspirin 81 MG tablet, Take 81 mg by mouth daily.  , Disp: , Rfl: ;  cholecalciferol (VITAMIN D) 1000 UNITS tablet, Take 2,000 Units by mouth 3 (three) times daily.  , Disp: , Rfl: ;  fish oil-omega-3 fatty acids 1000 MG capsule, Take by mouth daily.  , Disp: , Rfl:  Flaxseed, Linseed, (FLAXSEED OIL PO), Take by mouth daily.  , Disp: , Rfl: ;  lisinopril (PRINIVIL,ZESTRIL) 20 MG tablet, Take 20 mg by mouth daily.  , Disp: , Rfl: ;  meclizine (ANTIVERT) 25 MG tablet, Take 25 mg  by mouth 3 (three) times daily as needed.  , Disp: , Rfl: ;  metoprolol tartrate (LOPRESSOR) 25 MG tablet, Take 12.5 mg by mouth 2 (two) times daily.  , Disp: , Rfl:  Nutritional Supplements (ENSURE PO), Take by mouth 3 (three) times daily.  , Disp: , Rfl: ;  OxyCODONE HCl, Abuse Deter, 5 MG TABA, Take 1 tablet by mouth every 8 (eight) hours as needed (for pain)., Disp: 90 tablet, Rfl: 0;  pantoprazole (PROTONIX) 40 MG tablet, Take 40 mg by mouth 2 (two) times daily.  , Disp: , Rfl: ;  pregabalin (LYRICA) 75 MG capsule, Take 75 mg by mouth as needed.  , Disp: , Rfl:  promethazine (PHENERGAN) 12.5 MG tablet, Take 12.5 mg by mouth every 6 (six) hours as needed.  , Disp: , Rfl: ;  sertraline (ZOLOFT) 50 MG tablet, Take 50 mg by mouth daily.  , Disp: , Rfl: ;  simvastatin (ZOCOR) 40 MG tablet, Take 40 mg by mouth at bedtime.  , Disp: , Rfl:   BP 117/62  Pulse 76  Resp 18  Ht 5\' 10"  (1.778 m)  Wt 165  lb (74.844 kg)  BMI 23.68 kg/m2  Body mass index is 23.68 kg/(m^2).           Review of Systems occasional mild chest tightness and burning. Denies dyspnea on exertion. Walks with a walker. Has difficulty swallowing on occasion. Has problems with allergies.    Objective:   Physical Exam blood pressure 170/62 heart rate 76 respirations 18 Gen.-alert and oriented x3 in no apparent distress HEENT normal for age-elderly and frail-walks with a walker Lungs no rhonchi or wheezing Cardiovascular regular rhythm no murmurs carotid pulses 3+ palpable no bruits audible Abdomen soft nontender no palpable masses Musculoskeletal free of  major deformities Skin clear -no rashes Neurologic normal Lower extremities 3+ femoral and dorsalis pedis pulses palpable bilaterally with no edema  Today I ordered a CT angiogram which I reviewed the computer. The patient continues to have an endoleak which appears to be arising from the left renal artery where there is incomplete apposition and the neck of this very  complex aneurysm. The aneurysm dimensions are very similar to 6 months ago around 60-59 mm. There is also a type II endoleak arising from the distal lumbar branch.      Assessment:     Asymptomatic aortic aneurysm treated with aortic stent graft-with type II endoleak and possible type IA endoleak arising from the left renal artery and 79 year old gentleman Unchanged from previous exam 6 months ago    Plan:     Repeat study in 6 months-patient understands that there is endoleak which could result in rupture

## 2012-08-27 ENCOUNTER — Other Ambulatory Visit: Payer: Self-pay | Admitting: Geriatric Medicine

## 2012-08-27 MED ORDER — OXYCODONE HCL 5 MG PO TABA: 1.0000 | ORAL_TABLET | Freq: Three times a day (TID) | ORAL | Status: DC | PRN

## 2012-09-21 ENCOUNTER — Encounter: Payer: Self-pay | Admitting: Nurse Practitioner

## 2012-09-21 NOTE — Progress Notes (Signed)
This encounter was created in error - please disregard.

## 2012-10-19 ENCOUNTER — Encounter: Payer: Self-pay | Admitting: Nurse Practitioner

## 2012-10-19 NOTE — Progress Notes (Signed)
This encounter was created in error - please disregard.

## 2012-11-04 ENCOUNTER — Other Ambulatory Visit: Payer: Self-pay | Admitting: *Deleted

## 2012-11-04 MED ORDER — OXYCODONE HCL 5 MG PO TABA: 1.0000 | ORAL_TABLET | Freq: Three times a day (TID) | ORAL | Status: DC | PRN

## 2013-01-27 LAB — BASIC METABOLIC PANEL
BUN: 46 mg/dL — AB (ref 4–21)
Creatinine: 1.6 mg/dL — AB (ref 0.6–1.3)

## 2013-01-31 ENCOUNTER — Encounter: Payer: Self-pay | Admitting: Vascular Surgery

## 2013-02-01 ENCOUNTER — Ambulatory Visit (INDEPENDENT_AMBULATORY_CARE_PROVIDER_SITE_OTHER): Payer: Medicare PPO | Admitting: Vascular Surgery

## 2013-02-01 ENCOUNTER — Encounter: Payer: Self-pay | Admitting: Vascular Surgery

## 2013-02-01 ENCOUNTER — Ambulatory Visit
Admission: RE | Admit: 2013-02-01 | Discharge: 2013-02-01 | Disposition: A | Discharge status: Home / Self Care | Payer: Medicare PPO | Source: Ambulatory Visit | Attending: Vascular Surgery | Admitting: Vascular Surgery

## 2013-02-01 VITALS — BP 145/60 | HR 56 | Ht 70.0 in | Wt 165.0 lb

## 2013-02-01 DIAGNOSIS — Z48812 Encounter for surgical aftercare following surgery on the circulatory system: Secondary | ICD-10-CM

## 2013-02-01 DIAGNOSIS — I714 Abdominal aortic aneurysm, without rupture, unspecified: Secondary | ICD-10-CM | POA: Insufficient documentation

## 2013-02-01 MED ORDER — IOHEXOL 350 MG/ML SOLN
75.0000 mL | Freq: Once | INTRAVENOUS | Status: AC | PRN
  Administered 2013-02-01: 75 mL via INTRAVENOUS

## 2013-02-01 NOTE — Progress Notes (Signed)
Subjective:     Patient ID: Ricardo [name], male   DOB: [birth year], 79 y.o.   MRN: [id number]  HPI this 79 year old male returns for continued followup regarding his aortic stent graft which was inserted in November of 23rd . He complains mainly of chronic back pain and shoulder pain. He ambulates with a walker. He resides at friend's home. He has a none stable endoleak and a very complicated complex aneurysm with a large neck.  Past Medical History  Diagnosis Date  . Hypertension   . Hyperlipidemia   . Vertigo   . Hypothyroidism   . Fall      4x  . GERD (gastroesophageal reflux disease)   . Nausea & vomiting   . Dehydration   . Anemia   . Hyponatremia   . Hypercalcemia   . AAA (abdominal aortic aneurysm)   . Renal failure, acute   . Infectious colitis   . Fecal impaction   . Hiatal hernia   . Hiatal hernia   . Arthritis   . Joint pain   . Ulcer     Stomach  . Leg pain     with walking    History  Substance Use Topics  . Smoking status: Never Smoker   . Smokeless tobacco: Never Used  . Alcohol Use: No    Family History  Problem Relation Age of Onset  . Heart failure Brother     No Known Allergies  Current outpatient prescriptions:furosemide (LASIX) 20 MG tablet, Take 0.2 tablets by mouth every other day., Disp: , Rfl: ;  levothyroxine (SYNTHROID, LEVOTHROID) 50 MCG tablet, Take 50 mcg by mouth every other day., Disp: , Rfl: ;  losartan (COZAAR) 25 MG tablet, Take 1 tablet by mouth daily., Disp: , Rfl: ;  oxybutynin (DITROPAN-XL) 10 MG 24 hr tablet, Take 1 tablet by mouth daily., Disp: , Rfl:  ACETAMINOPHEN PO, Take by mouth.  , Disp: , Rfl: ;  ALPRAZolam (XANAX) 0.25 MG tablet, Take 0.5 mg by mouth at bedtime as needed.  , Disp: , Rfl: ;  aspirin 81 MG tablet, Take 81 mg by mouth daily.  , Disp: , Rfl: ;  cholecalciferol (VITAMIN D) 1000 UNITS tablet, Take 2,000 Units by mouth 3 (three) times daily.  , Disp: , Rfl: ;  fish oil-omega-3 fatty acids 1000 MG capsule,  Take by mouth daily.  , Disp: , Rfl:  Flaxseed, Linseed, (FLAXSEED OIL PO), Take by mouth daily.  , Disp: , Rfl: ;  lisinopril (PRINIVIL,ZESTRIL) 20 MG tablet, Take 20 mg by mouth daily.  , Disp: , Rfl: ;  meclizine (ANTIVERT) 25 MG tablet, Take 25 mg by mouth 3 (three) times daily as needed.  , Disp: , Rfl: ;  metoprolol tartrate (LOPRESSOR) 25 MG tablet, Take 12.5 mg by mouth 2 (two) times daily.  , Disp: , Rfl:  Nutritional Supplements (ENSURE PO), Take by mouth 3 (three) times daily.  , Disp: , Rfl: ;  OxyCODONE HCl, Abuse Deter, 5 MG TABA, Take 1 tablet by mouth every 8 (eight) hours as needed (for pain)., Disp: 90 tablet, Rfl: 0;  pantoprazole (PROTONIX) 40 MG tablet, Take 40 mg by mouth 2 (two) times daily.  , Disp: , Rfl: ;  pregabalin (LYRICA) 75 MG capsule, Take 75 mg by mouth as needed.  , Disp: , Rfl:  promethazine (PHENERGAN) 12.5 MG tablet, Take 12.5 mg by mouth every 6 (six) hours as needed.  , Disp: , Rfl: ;  sertraline (ZOLOFT) 50 MG  tablet, Take 50 mg by mouth daily.  , Disp: , Rfl: ;  simvastatin (ZOCOR) 40 MG tablet, Take 40 mg by mouth at bedtime.  , Disp: , Rfl:   BP 145/60  Pulse 56  Ht 5\' 10"  (1.778 m)  Wt 165 lb (74.844 kg)  BMI 23.68 kg/m2  SpO2 94%  Body mass index is 23.68 kg/(m^2).          Review of Systems denies any new symptoms other than his chronic arthritic problems. Denies abdominal discomfort or nausea and vomiting. Other systems negative and complete review of systems    Objective:   Physical Exam BP 145/60  Pulse 56  Ht 5\' 10"  (1.778 m)  Wt 165 lb (74.844 kg)  BMI 23.68 kg/m2  SpO2 94%  Gen.-alert and oriented x3 in no apparent distress HEENT normal for age Lungs no rhonchi or wheezing Cardiovascular regular rhythm no murmurs carotid pulses 3+ palpable no bruits audible Abdomen soft nontender no palpable masses Musculoskeletal free of  major deformities Skin clear -no rashes Neurologic normal Lower extremities 3+ femoral and dorsalis  pedis pulses palpable bilaterally with no edema  Today I ordered a CT angiogram of the abdomen and pelvis which are reviewed by computer. Compared this to the previous study performed in May of 2014. There has been very little change over the past 6 months. Patient has a complex neck with incomplete apposition of the graft to the left proximal wall near the left renal artery with some pooling of contrast which is unchanged from previous study. There is also a type II endoleak in the mid to distal part of the aneurysm sac which does not seem as prominent as the previous study. Maximum diameter of the aneurysm continues to be around 60 mm.     Assessment:     Stable abdominal aortic aneurysm status post endovascular stent graft repair 2 years ago with chronic endoleak. Incomplete apposition of the graft to left proximal neck near left renal artery which is unchanged over the past 2 years    Plan:     Patient is 79 years old in April of next year. Will return in one year for continued followup with CT angiogram of abdomen and pelvis

## 2013-02-01 NOTE — Addendum Note (Signed)
Addended by: Dannielle Karvonen on: 02/01/2013 04:16 PM   Modules accepted: Orders

## 2013-02-07 ENCOUNTER — Other Ambulatory Visit: Payer: Self-pay | Admitting: *Deleted

## 2013-02-07 MED ORDER — OXYCODONE HCL 5 MG PO TABA: 1.0000 | ORAL_TABLET | Freq: Three times a day (TID) | ORAL | Status: DC | PRN

## 2013-02-17 ENCOUNTER — Encounter: Payer: Self-pay | Admitting: Vascular Surgery

## 2013-03-08 ENCOUNTER — Non-Acute Institutional Stay: Payer: Medicare PPO | Admitting: Nurse Practitioner

## 2013-03-08 ENCOUNTER — Encounter: Payer: Self-pay | Admitting: Nurse Practitioner

## 2013-03-08 DIAGNOSIS — I10 Essential (primary) hypertension: Secondary | ICD-10-CM

## 2013-03-08 DIAGNOSIS — K219 Gastro-esophageal reflux disease without esophagitis: Secondary | ICD-10-CM

## 2013-03-08 DIAGNOSIS — K5900 Constipation, unspecified: Secondary | ICD-10-CM

## 2013-03-08 DIAGNOSIS — N400 Enlarged prostate without lower urinary tract symptoms: Secondary | ICD-10-CM

## 2013-03-08 DIAGNOSIS — S0101XA Laceration without foreign body of scalp, initial encounter: Secondary | ICD-10-CM | POA: Insufficient documentation

## 2013-03-08 DIAGNOSIS — R42 Dizziness and giddiness: Secondary | ICD-10-CM

## 2013-03-08 DIAGNOSIS — E039 Hypothyroidism, unspecified: Secondary | ICD-10-CM

## 2013-03-08 DIAGNOSIS — IMO0002 Reserved for concepts with insufficient information to code with codable children: Secondary | ICD-10-CM

## 2013-03-08 DIAGNOSIS — W19XXXD Unspecified fall, subsequent encounter: Secondary | ICD-10-CM

## 2013-03-08 DIAGNOSIS — W19XXXA Unspecified fall, initial encounter: Secondary | ICD-10-CM | POA: Insufficient documentation

## 2013-03-08 DIAGNOSIS — S0101XS Laceration without foreign body of scalp, sequela: Secondary | ICD-10-CM

## 2013-03-08 DIAGNOSIS — F341 Dysthymic disorder: Secondary | ICD-10-CM

## 2013-03-08 DIAGNOSIS — F418 Other specified anxiety disorders: Secondary | ICD-10-CM | POA: Insufficient documentation

## 2013-03-08 DIAGNOSIS — D509 Iron deficiency anemia, unspecified: Secondary | ICD-10-CM

## 2013-03-08 NOTE — Progress Notes (Signed)
Patient ID: Ricardo [name], male   DOB: 1913-11-13, 79 y.o.   MRN: [id number]   Code Status: DNR  No Known Allergies  Chief Complaint  Patient presents with  . Medical Managment of Chronic Issues    s/p fall 03/04/13, scalp laceration left parietal area.   . Acute Visit    HPI: Patient is a 79 y.o. male seen in the AL at [facility] today for  evaluation of the left parietal scalp laceration, s/p fall 03/04/13,  and other chronic medical conditions Problem List Items Addressed This Visit   ANEMIA, IRON DEFICIENCY, CHRONIC     Update CBC    HYPERTENSION - Primary     Controlled, takes Furosemide 10mg  qod, Bun/creat 46/1.55 01/27/13, takes Losartan 38m mg, update CMP    GERD     Stable, takes Pantoprazole 40mg  bid.     VERTIGO     Takes Meclizine 25mg  tid--easily lost balance and falling.     Unspecified constipation     Stable on Senokot S I qod and MiraLax daily.     Unspecified hypothyroidism     Takes Levothyroxine , last TSH 2.641 04/26/12, update TSH    BPH (benign prostatic hyperplasia)     No urinary retention, takes Oxybutynin 10mg  daily, Finasteride 5mg  daily, Doxazosin 4mg  daily     Depression with anxiety     Stable on Sertraline 50mg  daily.     Fall at nursing home     Lost balance per the patient when he gets dizzy which he has long standing hx of. Takes Meclizine 25mg  tid for symptomatic management. The patient was reminded to transfer and ambulates safely as previously instructed by PT. The patient is aware of the risk of falling and stated his 2 brothers died from fall    Scalp laceration     As the resultant of the fall 03/04/13, 1 cm mild laceration well approximated w/o s/s of infection, mild swelling and heat noted, it should heal.        Review of Systems:  Review of Systems  Constitutional: Negative for fever, chills, weight loss, malaise/fatigue and diaphoresis.  HENT: Positive for hearing loss. Negative for congestion, ear  discharge, ear pain, nosebleeds, sore throat and tinnitus.        Hearing aid R ear  Eyes: Negative for blurred vision, double vision, photophobia, pain, discharge and redness.  Respiratory: Negative for cough, hemoptysis, sputum production, shortness of breath, wheezing and stridor.   Cardiovascular: Negative for chest pain, palpitations, orthopnea, claudication, leg swelling and PND.  Gastrointestinal: Negative for heartburn, nausea, vomiting, abdominal pain, diarrhea, constipation and blood in stool.  Genitourinary: Positive for frequency. Negative for dysuria, urgency, hematuria and flank pain.  Musculoskeletal: Positive for falls. Negative for back pain, joint pain, myalgias and neck pain.       Ambulates with walker.   Skin: Negative for itching and rash.       About 1cm scalp laceration at the left parietal area-healing nicely.   Neurological: Positive for dizziness. Negative for tingling, tremors, sensory change, speech change, focal weakness, seizures, loss of consciousness, weakness and headaches.       Hx of vertigo  Endo/Heme/Allergies: Negative for environmental allergies and polydipsia. Does not bruise/bleed easily.  Psychiatric/Behavioral: Negative for depression, suicidal ideas, hallucinations, memory loss and substance abuse. The patient is not nervous/anxious and does not have insomnia.      Past Medical History  Diagnosis Date  . Hypertension   . Hyperlipidemia   .  Vertigo   . Hypothyroidism   . Fall      4x  . GERD (gastroesophageal reflux disease)   . Nausea & vomiting   . Dehydration   . Anemia   . Hyponatremia   . Hypercalcemia   . AAA (abdominal aortic aneurysm)   . Renal failure, acute   . Infectious colitis   . Fecal impaction   . Hiatal hernia   . Hiatal hernia   . Arthritis   . Joint pain   . Ulcer     Stomach  . Leg pain     with walking   Past Surgical History  Procedure Laterality Date  . Appendectomy    . Cataract extraction    . Hernia  repair    . Transthoracic echocardiogram  07/19/2010     Left ventricle: There is hypokinesis of the inferior wall and  posterior wall. The EF is 35-40%. The cavity size was normal  . Abdominal aortic aneurysm repair  12-18-10    Stent graft repair of AAA   Social History:   reports that he has never smoked. He has never used smokeless tobacco. He reports that he does not drink alcohol or use illicit drugs.  Family History  Problem Relation Age of Onset  . Heart failure Brother     Medications: Patient's Medications  New Prescriptions   No medications on file  Previous Medications   ACETAMINOPHEN PO    Take by mouth.     ALPRAZOLAM (XANAX) 0.25 MG TABLET    Take 0.5 mg by mouth at bedtime as needed.     ASPIRIN 81 MG TABLET    Take 81 mg by mouth daily.     CHOLECALCIFEROL (VITAMIN D) 1000 UNITS TABLET    Take 2,000 Units by mouth 3 (three) times daily.     DOXAZOSIN (CARDURA) 4 MG TABLET    Take 1 tablet by mouth daily.   FINASTERIDE (PROSCAR) 5 MG TABLET    Take 1 tablet by mouth daily.   FISH OIL-OMEGA-3 FATTY ACIDS 1000 MG CAPSULE    Take by mouth daily.     FLAXSEED, LINSEED, (FLAXSEED OIL PO)    Take by mouth daily.     FUROSEMIDE (LASIX) 20 MG TABLET    Take 0.2 tablets by mouth every other day.   LEVOTHYROXINE (SYNTHROID, LEVOTHROID) 50 MCG TABLET    Take 50 mcg by mouth every other day.   LISINOPRIL (PRINIVIL,ZESTRIL) 20 MG TABLET    Take 20 mg by mouth daily.     LOSARTAN (COZAAR) 25 MG TABLET    Take 1 tablet by mouth daily.   MECLIZINE (ANTIVERT) 25 MG TABLET    Take 25 mg by mouth 3 (three) times daily as needed.     METOPROLOL TARTRATE (LOPRESSOR) 25 MG TABLET    Take 12.5 mg by mouth 2 (two) times daily.     NUTRITIONAL SUPPLEMENTS (ENSURE PO)    Take by mouth 3 (three) times daily.     OXYBUTYNIN (DITROPAN-XL) 10 MG 24 HR TABLET    Take 1 tablet by mouth daily.   OXYCODONE HCL, ABUSE DETER, 5 MG TABA    Take 1 tablet by mouth every 8 (eight) hours as needed (for  pain).   PANTOPRAZOLE (PROTONIX) 40 MG TABLET    Take 40 mg by mouth 2 (two) times daily.     PREGABALIN (LYRICA) 75 MG CAPSULE    Take 75 mg by mouth as needed.  PROMETHAZINE (PHENERGAN) 12.5 MG TABLET    Take 12.5 mg by mouth every 6 (six) hours as needed.     SENNA-DOCUSATE (SENEXON-S) 8.6-50 MG PER TABLET    Take 1 tablet by mouth every other day.   SERTRALINE (ZOLOFT) 50 MG TABLET    Take 50 mg by mouth daily.     SIMVASTATIN (ZOCOR) 40 MG TABLET    Take 40 mg by mouth at bedtime.    Modified Medications   No medications on file  Discontinued Medications   No medications on file     Physical Exam: Physical Exam  Constitutional: He is oriented to person, place, and time. He appears well-developed and well-nourished. No distress.  HENT:  Head: Normocephalic and atraumatic.  Right Ear: External ear normal.  Left Ear: External ear normal.  Nose: Nose normal.  Mouth/Throat: Oropharynx is clear and moist. No oropharyngeal exudate.  Impacted cerumen was manually removed   Eyes: Conjunctivae and EOM are normal. Pupils are equal, round, and reactive to light. Right eye exhibits no discharge. Left eye exhibits no discharge. No scleral icterus.  Neck: Normal range of motion. Neck supple. No JVD present. No tracheal deviation present. No thyromegaly present.  Cardiovascular: Normal rate, regular rhythm, normal heart sounds and intact distal pulses.   No murmur heard. Pulmonary/Chest: Effort normal and breath sounds normal. Stridor present. No respiratory distress. He has no wheezes. He has no rales. He exhibits no tenderness.  Abdominal: Soft. Bowel sounds are normal. He exhibits no distension and no mass. There is no tenderness. There is no rebound and no guarding.  Musculoskeletal: Normal range of motion. He exhibits no edema and no tenderness.  Lymphadenopathy:    He has no cervical adenopathy.  Neurological: He is alert and oriented to person, place, and time. He has normal reflexes.  He displays normal reflexes. No cranial nerve deficit. He exhibits normal muscle tone. Coordination normal.  Skin: No rash noted. He is not diaphoretic. No erythema. No pallor.  About 1cm scalp laceration at the left parietal area-healing nicely.    Psychiatric: He has a normal mood and affect. His behavior is normal. Judgment and thought content normal.    Filed Vitals:   03/08/13 1557  BP: 136/62  Pulse: 52  Temp: 97.3 F (36.3 C)  TempSrc: Tympanic  Resp: 18      Labs reviewed: Basic Metabolic Panel:  Recent Labs  16/10/96 07/22/12 01/27/13  NA 135*  --   --   K 4.7  --   --   BUN 39* 35* 46*  CREATININE 1.4* 1.4* 1.6*  TSH 2.64  --   --    Liver Function Tests:  Recent Labs  04/26/12  AST 14  ALT 8*  ALKPHOS 85   CBC:  Recent Labs  04/26/12  WBC 6.2  HGB 11.2*  HCT 33*  PLT 209   Assessment/Plan HYPERTENSION Controlled, takes Furosemide 10mg  qod, Bun/creat 46/1.55 01/27/13, takes Losartan 79m mg, update CMP  Unspecified constipation Stable on Senokot S I qod and MiraLax daily.   Unspecified hypothyroidism Takes Levothyroxine , last TSH 2.641 04/26/12, update TSH  GERD Stable, takes Pantoprazole 40mg  bid.   VERTIGO Takes Meclizine 25mg  tid--easily lost balance and falling.   BPH (benign prostatic hyperplasia) No urinary retention, takes Oxybutynin 10mg  daily, Finasteride 5mg  daily, Doxazosin 4mg  daily   Depression with anxiety Stable on Sertraline 50mg  daily.   Fall at nursing home Lost balance per the patient when he gets dizzy which he has  long standing hx of. Takes Meclizine 25mg  tid for symptomatic management. The patient was reminded to transfer and ambulates safely as previously instructed by PT. The patient is aware of the risk of falling and stated his 2 brothers died from fall  Scalp laceration As the resultant of the fall 03/04/13, 1 cm mild laceration well approximated w/o s/s of infection, mild swelling and heat noted, it  should heal.   ANEMIA, IRON DEFICIENCY, CHRONIC Update CBC    Family/ Staff Communication: observe the patient.   Goals of Care: AL  Labs/tests ordered: CBC, CMP, TSH

## 2013-03-08 NOTE — Assessment & Plan Note (Signed)
Lost balance per the patient when he gets dizzy which he has long standing hx of. Takes Meclizine 25mg  tid for symptomatic management. The patient was reminded to transfer and ambulates safely as previously instructed by PT. The patient is aware of the risk of falling and stated his 2 brothers died from fall

## 2013-03-08 NOTE — Assessment & Plan Note (Signed)
Stable, takes Pantoprazole 40mg bid.   

## 2013-03-08 NOTE — Assessment & Plan Note (Addendum)
Stable on Senokot S I qod and MiraLax daily.   

## 2013-03-08 NOTE — Assessment & Plan Note (Signed)
No urinary retention, takes Oxybutynin 10mg daily, Finasteride 5mg daily, Doxazosin 4mg daily   

## 2013-03-08 NOTE — Assessment & Plan Note (Signed)
Takes Meclizine 25mg  tid--easily lost balance and falling.

## 2013-03-08 NOTE — Assessment & Plan Note (Signed)
As the resultant of the fall 03/04/13, 1 cm mild laceration well approximated w/o s/s of infection, mild swelling and heat noted, it should heal.

## 2013-03-08 NOTE — Assessment & Plan Note (Addendum)
Takes Levothyroxine , last TSH 2.641 04/26/12, update TSH

## 2013-03-08 NOTE — Assessment & Plan Note (Signed)
Update CBC. 

## 2013-03-08 NOTE — Assessment & Plan Note (Addendum)
Controlled, takes Furosemide 10mg  qod, Bun/creat 46/1.55 01/27/13, takes Losartan 79m mg, update CMP

## 2013-03-08 NOTE — Assessment & Plan Note (Signed)
Stable on Sertraline 50mg daily.  

## 2013-03-09 LAB — BASIC METABOLIC PANEL
BUN: 32 mg/dL — AB (ref 4–21)
Creatinine: 1.5 mg/dL — AB (ref 0.6–1.3)
Potassium: 4.5 mmol/L (ref 3.4–5.3)
Sodium: 135 mmol/L — AB (ref 137–147)

## 2013-03-09 LAB — CBC AND DIFFERENTIAL
HEMATOCRIT: 32 % — AB (ref 41–53)
HEMOGLOBIN: 10.7 g/dL — AB (ref 13.5–17.5)
PLATELETS: 183 10*3/uL (ref 150–399)
WBC: 5.5 10^3/mL

## 2013-03-09 LAB — HEPATIC FUNCTION PANEL
ALT: 9 U/L — AB (ref 10–40)
AST: 19 U/L (ref 14–40)
Alkaline Phosphatase: 95 U/L (ref 25–125)
Bilirubin, Total: 0.4 mg/dL

## 2013-03-09 LAB — TSH: TSH: 2.52 u[IU]/mL (ref 0.41–5.90)

## 2013-04-13 ENCOUNTER — Other Ambulatory Visit: Payer: Self-pay | Admitting: *Deleted

## 2013-04-13 MED ORDER — OXYCODONE HCL 5 MG PO TABA: ORAL_TABLET | ORAL | Status: DC

## 2013-04-13 NOTE — Telephone Encounter (Signed)
[facility]  

## 2013-05-31 ENCOUNTER — Non-Acute Institutional Stay: Payer: Medicare PPO | Admitting: Nurse Practitioner

## 2013-05-31 ENCOUNTER — Encounter: Payer: Self-pay | Admitting: Nurse Practitioner

## 2013-05-31 DIAGNOSIS — K219 Gastro-esophageal reflux disease without esophagitis: Secondary | ICD-10-CM

## 2013-05-31 DIAGNOSIS — S0101XA Laceration without foreign body of scalp, initial encounter: Secondary | ICD-10-CM

## 2013-05-31 DIAGNOSIS — I10 Essential (primary) hypertension: Secondary | ICD-10-CM

## 2013-05-31 DIAGNOSIS — F418 Other specified anxiety disorders: Secondary | ICD-10-CM

## 2013-05-31 DIAGNOSIS — S91109A Unspecified open wound of unspecified toe(s) without damage to nail, initial encounter: Secondary | ICD-10-CM

## 2013-05-31 DIAGNOSIS — F341 Dysthymic disorder: Secondary | ICD-10-CM

## 2013-05-31 DIAGNOSIS — S91209A Unspecified open wound of unspecified toe(s) with damage to nail, initial encounter: Secondary | ICD-10-CM

## 2013-05-31 DIAGNOSIS — S0100XA Unspecified open wound of scalp, initial encounter: Secondary | ICD-10-CM

## 2013-05-31 DIAGNOSIS — N400 Enlarged prostate without lower urinary tract symptoms: Secondary | ICD-10-CM

## 2013-05-31 DIAGNOSIS — E039 Hypothyroidism, unspecified: Secondary | ICD-10-CM

## 2013-05-31 DIAGNOSIS — K5900 Constipation, unspecified: Secondary | ICD-10-CM

## 2013-05-31 NOTE — Progress Notes (Signed)
Patient ID: Ricardo [name], male   DOB: April 08, 1913, 80 y.o.   MRN: [id number]   Code Status: DNR  No Known Allergies  Chief Complaint  Patient presents with  . Medical Managment of Chronic Issues  . Acute Visit    left great toe bleeding.     HPI: Patient is a 80 y.o. male seen in the AL at [facility] today for evaluation of staff requested to assess the patient's left 2nd middle toenail bleeding, s/p fall 03/04/13,  and other chronic medical conditions Problem List Items Addressed This Visit   BPH (benign prostatic hyperplasia)     No urinary retention, takes Oxybutynin 10mg  daily, Finasteride 5mg  daily, Doxazosin 4mg  daily      Depression with anxiety     Stable on Sertraline 50mg  daily.      GERD     Stable, takes Pantoprazole 40mg  bid.      HYPERTENSION - Primary     Controlled, takes Furosemide 10mg  qod, Bun/creat 46/1.55 01/27/13, takes Losartan 1m mg    Scalp laceration     As the resultant of the fall 03/04/13, 1 cm mild laceration well approximated w/o s/s of infection, mild swelling and heat noted, healed    Traumatic loss of toenail     The left great toenail-minimal bled-no s/s of infection, it should heal.     Unspecified constipation     Stable on Senokot S I qod and MiraLax daily.      Unspecified hypothyroidism     Takes Levothyroxine , last TSH 2.641 04/26/12       Review of Systems:  Review of Systems  Constitutional: Negative for fever, chills, weight loss, malaise/fatigue and diaphoresis.  HENT: Positive for hearing loss. Negative for congestion, ear discharge, ear pain, nosebleeds, sore throat and tinnitus.        Hearing aid R ear  Eyes: Negative for blurred vision, double vision, photophobia, pain, discharge and redness.  Respiratory: Negative for cough, hemoptysis, sputum production, shortness of breath, wheezing and stridor.   Cardiovascular: Negative for chest pain, palpitations, orthopnea, claudication, leg swelling and  PND.  Gastrointestinal: Negative for heartburn, nausea, vomiting, abdominal pain, diarrhea, constipation and blood in stool.  Genitourinary: Positive for frequency. Negative for dysuria, urgency, hematuria and flank pain.  Musculoskeletal: Positive for falls. Negative for back pain, joint pain, myalgias and neck pain.       Ambulates with walker.   Skin: Negative for itching and rash.       About 1cm scalp laceration at the left parietal area-healed. The left great toe nail missing-small amount of bleeding noted-no s/s of infection.   Neurological: Positive for dizziness. Negative for tingling, tremors, sensory change, speech change, focal weakness, seizures, loss of consciousness, weakness and headaches.       Hx of vertigo  Endo/Heme/Allergies: Negative for environmental allergies and polydipsia. Does not bruise/bleed easily.  Psychiatric/Behavioral: Negative for depression, suicidal ideas, hallucinations, memory loss and substance abuse. The patient is not nervous/anxious and does not have insomnia.      Past Medical History  Diagnosis Date  . Hypertension   . Hyperlipidemia   . Vertigo   . Hypothyroidism   . Fall      4x  . GERD (gastroesophageal reflux disease)   . Nausea & vomiting   . Dehydration   . Anemia   . Hyponatremia   . Hypercalcemia   . AAA (abdominal aortic aneurysm)   . Renal failure, acute   . Infectious  colitis   . Fecal impaction   . Hiatal hernia   . Hiatal hernia   . Arthritis   . Joint pain   . Ulcer     Stomach  . Leg pain     with walking   Past Surgical History  Procedure Laterality Date  . Appendectomy    . Cataract extraction    . Hernia repair    . Transthoracic echocardiogram  07/19/2010     Left ventricle: There is hypokinesis of the inferior wall and  posterior wall. The EF is 35-40%. The cavity size was normal  . Abdominal aortic aneurysm repair  12-18-10    Stent graft repair of AAA   Social History:   reports that he has never  smoked. He has never used smokeless tobacco. He reports that he does not drink alcohol or use illicit drugs.  Family History  Problem Relation Age of Onset  . Heart failure Brother     Medications: Patient's Medications  New Prescriptions   No medications on file  Previous Medications   ACETAMINOPHEN PO    Take by mouth.     ALPRAZOLAM (XANAX) 0.25 MG TABLET    Take 0.5 mg by mouth at bedtime as needed.     ASPIRIN 81 MG TABLET    Take 81 mg by mouth daily.     CHOLECALCIFEROL (VITAMIN D) 1000 UNITS TABLET    Take 2,000 Units by mouth 3 (three) times daily.     DOXAZOSIN (CARDURA) 4 MG TABLET    Take 1 tablet by mouth daily.   FINASTERIDE (PROSCAR) 5 MG TABLET    Take 1 tablet by mouth daily.   FISH OIL-OMEGA-3 FATTY ACIDS 1000 MG CAPSULE    Take by mouth daily.     FLAXSEED, LINSEED, (FLAXSEED OIL PO)    Take by mouth daily.     FUROSEMIDE (LASIX) 20 MG TABLET    Take 0.2 tablets by mouth every other day.   LEVOTHYROXINE (SYNTHROID, LEVOTHROID) 50 MCG TABLET    Take 50 mcg by mouth every other day.   LISINOPRIL (PRINIVIL,ZESTRIL) 20 MG TABLET    Take 20 mg by mouth daily.     LOSARTAN (COZAAR) 25 MG TABLET    Take 1 tablet by mouth daily.   MECLIZINE (ANTIVERT) 25 MG TABLET    Take 25 mg by mouth 3 (three) times daily as needed.     METOPROLOL TARTRATE (LOPRESSOR) 25 MG TABLET    Take 12.5 mg by mouth 2 (two) times daily.     NUTRITIONAL SUPPLEMENTS (ENSURE PO)    Take by mouth 3 (three) times daily.     OXYBUTYNIN (DITROPAN-XL) 10 MG 24 HR TABLET    Take 1 tablet by mouth daily.   OXYCODONE HCL, ABUSE DETER, 5 MG TABA    Take one tablet by mouth every 8 hours as needed for pain   PANTOPRAZOLE (PROTONIX) 40 MG TABLET    Take 40 mg by mouth 2 (two) times daily.     PREGABALIN (LYRICA) 75 MG CAPSULE    Take 75 mg by mouth as needed.     PROMETHAZINE (PHENERGAN) 12.5 MG TABLET    Take 12.5 mg by mouth every 6 (six) hours as needed.     SENNA-DOCUSATE (SENEXON-S) 8.6-50 MG PER TABLET     Take 1 tablet by mouth every other day.   SERTRALINE (ZOLOFT) 50 MG TABLET    Take 50 mg by mouth daily.     SIMVASTATIN (  ZOCOR) 40 MG TABLET    Take 40 mg by mouth at bedtime.    Modified Medications   No medications on file  Discontinued Medications   No medications on file     Physical Exam: Physical Exam  Constitutional: He is oriented to person, place, and time. He appears well-developed and well-nourished. No distress.  HENT:  Head: Normocephalic and atraumatic.  Right Ear: External ear normal.  Left Ear: External ear normal.  Nose: Nose normal.  Mouth/Throat: Oropharynx is clear and moist. No oropharyngeal exudate.  Impacted cerumen was manually removed   Eyes: Conjunctivae and EOM are normal. Pupils are equal, round, and reactive to light. Right eye exhibits no discharge. Left eye exhibits no discharge. No scleral icterus.  Neck: Normal range of motion. Neck supple. No JVD present. No tracheal deviation present. No thyromegaly present.  Cardiovascular: Normal rate, regular rhythm and intact distal pulses.   Murmur heard. 3/6  Pulmonary/Chest: Effort normal and breath sounds normal. Stridor present. No respiratory distress. He has no wheezes. He has no rales. He exhibits no tenderness.  Abdominal: Soft. Bowel sounds are normal. He exhibits no distension and no mass. There is no tenderness. There is no rebound and no guarding.  Musculoskeletal: Normal range of motion. He exhibits tenderness. He exhibits no edema.  Chronic back pain. Ambulates with walker and w/c to go further.   Lymphadenopathy:    He has no cervical adenopathy.  Neurological: He is alert and oriented to person, place, and time. He has normal reflexes. No cranial nerve deficit. He exhibits normal muscle tone. Coordination normal.  Skin: No rash noted. He is not diaphoretic. No erythema. No pallor.  About 1cm scalp laceration at the left parietal area-healed. The right great toe nail missing-small amount of  bleeding noted-no s/s of infection.    Psychiatric: He has a normal mood and affect. His behavior is normal. Judgment and thought content normal.    Filed Vitals:   05/31/13 1559  BP: 140/68  Pulse: 60  Temp: 96.6 F (35.9 C)  TempSrc: Tympanic  Resp: 20  SpO2: 94%      Labs reviewed: Basic Metabolic Panel:  Recent Labs  40/98/1105/15/14 01/27/13  BUN 35* 46*  CREATININE 1.4* 1.6*   Liver Function Tests: No results found for this basename: AST, ALT, ALKPHOS, BILITOT, PROT, ALBUMIN,  in the last 8760 hours CBC: No results found for this basename: WBC, NEUTROABS, HGB, HCT, MCV, PLT,  in the last 8760 hours Assessment/Plan Scalp laceration As the resultant of the fall 03/04/13, 1 cm mild laceration well approximated w/o s/s of infection, mild swelling and heat noted, healed  Unspecified hypothyroidism Takes Levothyroxine 50mcg, last TSH 2.641 04/26/12  HYPERTENSION Controlled, takes Furosemide 10mg  qod, Bun/creat 46/1.55 01/27/13, takes Losartan 1836m mg  GERD Stable, takes Pantoprazole 40mg  bid.    BPH (benign prostatic hyperplasia) No urinary retention, takes Oxybutynin 10mg  daily, Finasteride 5mg  daily, Doxazosin 4mg  daily    Depression with anxiety Stable on Sertraline 50mg  daily.    Unspecified constipation Stable on Senokot S I qod and MiraLax daily.    Traumatic loss of toenail The left great toenail-minimal bled-no s/s of infection, it should heal.     Family/ Staff Communication: observe the patient.   Goals of Care: AL  Labs/tests ordered: none

## 2013-06-05 DIAGNOSIS — S91209A Unspecified open wound of unspecified toe(s) with damage to nail, initial encounter: Secondary | ICD-10-CM | POA: Insufficient documentation

## 2013-06-05 NOTE — Assessment & Plan Note (Signed)
Stable on Senokot S I qod and MiraLax daily.   

## 2013-06-05 NOTE — Assessment & Plan Note (Signed)
Takes Levothyroxine 50mcg, last TSH 2.641 04/26/12

## 2013-06-05 NOTE — Assessment & Plan Note (Signed)
No urinary retention, takes Oxybutynin 10mg  daily, Finasteride 5mg  daily, Doxazosin 4mg  daily

## 2013-06-05 NOTE — Assessment & Plan Note (Signed)
As the resultant of the fall 03/04/13, 1 cm mild laceration well approximated w/o s/s of infection, mild swelling and heat noted, healed

## 2013-06-05 NOTE — Assessment & Plan Note (Signed)
Stable, takes Pantoprazole 40mg  bid.

## 2013-06-05 NOTE — Assessment & Plan Note (Signed)
Stable on Sertraline 50mg daily.  

## 2013-06-05 NOTE — Assessment & Plan Note (Signed)
Controlled, takes Furosemide 10mg  qod, Bun/creat 46/1.55 01/27/13, takes Losartan 5874m mg

## 2013-06-05 NOTE — Assessment & Plan Note (Signed)
The left great toenail-minimal bled-no s/s of infection, it should heal.

## 2013-06-21 ENCOUNTER — Encounter: Payer: Self-pay | Admitting: Internal Medicine

## 2013-06-21 ENCOUNTER — Non-Acute Institutional Stay: Payer: Medicare PPO | Admitting: Internal Medicine

## 2013-06-21 DIAGNOSIS — F418 Other specified anxiety disorders: Secondary | ICD-10-CM

## 2013-06-21 DIAGNOSIS — N183 Chronic kidney disease, stage 3 unspecified: Secondary | ICD-10-CM

## 2013-06-21 DIAGNOSIS — M545 Low back pain, unspecified: Secondary | ICD-10-CM

## 2013-06-21 DIAGNOSIS — R42 Dizziness and giddiness: Secondary | ICD-10-CM

## 2013-06-21 DIAGNOSIS — R269 Unspecified abnormalities of gait and mobility: Secondary | ICD-10-CM

## 2013-06-21 DIAGNOSIS — F341 Dysthymic disorder: Secondary | ICD-10-CM

## 2013-06-21 DIAGNOSIS — E039 Hypothyroidism, unspecified: Secondary | ICD-10-CM

## 2013-06-21 DIAGNOSIS — N400 Enlarged prostate without lower urinary tract symptoms: Secondary | ICD-10-CM

## 2013-06-21 DIAGNOSIS — H9190 Unspecified hearing loss, unspecified ear: Secondary | ICD-10-CM | POA: Insufficient documentation

## 2013-06-21 DIAGNOSIS — I10 Essential (primary) hypertension: Secondary | ICD-10-CM

## 2013-06-21 DIAGNOSIS — D509 Iron deficiency anemia, unspecified: Secondary | ICD-10-CM

## 2013-06-25 ENCOUNTER — Encounter: Payer: Self-pay | Admitting: Internal Medicine

## 2013-06-25 NOTE — Progress Notes (Signed)
Patient ID: Ricardo [name], male   DOB: 12-20-1913, 80 y.o.   MRN: [id number]    Location:  [facility]  Place of Service: CLINIC    Allergies  Allergen Reactions  . Indocin [Indomethacin]     Chief Complaint  Patient presents with  . Medical Managment of Chronic Issues    blood pressure, thyroid, depression    HPI:  80 year old male who resides at [facility], [place], is seen in clinic for routine management of chronic medical issues  HYPERTENSION: controlled  Unspecified hypothyroidism: Compensated  Chronic kidney disease, stage III (moderate): Unchanged  ANEMIA, IRON DEFICIENCY, CHRONIC: Improved  BPH (benign prostatic hyperplasia): Some hesitation and diminished stream. He denies incontinence.  Deaf: Chronic and unchanged. Interferes with communication.  Lumbago: Chronic low back discomfort. Unchanged.  VERTIGO: Episodic. No recent episodes.  Depression with anxiety: Chronic and unchanged. Using antidepressant.    Medications: Patient's Medications  New Prescriptions   No medications on file  Previous Medications   ACETAMINOPHEN PO    Take by mouth. 325mg  take 2 every 6 hours as needed   ASPIRIN 81 MG TABLET    Take 81 mg by mouth daily.     CHOLECALCIFEROL (VITAMIN D) 1000 UNITS TABLET    Take 2,000 Units by mouth daily.    DOCUSATE (COLACE) 50 MG/5ML LIQUID    Take by mouth. 3-5 drops in each ear weekly Sat/Sun at bedtime and flush afterwards   DOXAZOSIN (CARDURA) 4 MG TABLET    Take 1 tablet by mouth daily.   FINASTERIDE (PROSCAR) 5 MG TABLET    Take 1 tablet by mouth daily.   FISH OIL-OMEGA-3 FATTY ACIDS 1000 MG CAPSULE    Take by mouth daily.     FLAXSEED, LINSEED, (FLAXSEED OIL PO)    Take by mouth daily.     FUROSEMIDE (LASIX) 20 MG TABLET    Take by mouth. Take 1/2 tablet every other day   LEVOTHYROXINE (SYNTHROID, LEVOTHROID) 50 MCG TABLET    Take 50 mcg by mouth every other day.   LOSARTAN (COZAAR) 25 MG TABLET    Take 1 tablet by mouth daily.   MECLIZINE (ANTIVERT) 25 MG TABLET    Take 25 mg by mouth 3 (three) times daily as needed.     MULTIPLE VITAMINS-MINERALS (I-VITE PO)    Take by mouth. One tablet daily for eyes   NUTRITIONAL SUPPLEMENTS (ENSURE PO)    Take by mouth 3 (three) times daily.     OXYBUTYNIN (DITROPAN-XL) 10 MG 24 HR TABLET    Take 1 tablet by mouth daily.   OXYCODONE HCL, ABUSE DETER, 5 MG TABA    Take one tablet by mouth every 8 hours as needed for pain   PANTOPRAZOLE (PROTONIX) 40 MG TABLET    Take 40 mg by mouth 2 (two) times daily.     POLYETHYLENE GLYCOL POWDER (GLYCOLAX/MIRALAX) POWDER    17g once a day in liquid for constipation   POLYVINYL ALCOHOL (ARTIFICIAL TEARS) 1.4 % OPHTHALMIC SOLUTION    1 drop. In both eyes twice daily   SENNA-DOCUSATE (SENEXON-S) 8.6-50 MG PER TABLET    Take 1 tablet by mouth every other day.   SERTRALINE (ZOLOFT) 50 MG TABLET    Take 50 mg by mouth daily.     SIMVASTATIN (ZOCOR) 40 MG TABLET    Take 40 mg by mouth at bedtime.    Modified Medications   No medications on file  Discontinued Medications   ALPRAZOLAM (XANAX) 0.25  MG TABLET    Take 0.5 mg by mouth at bedtime as needed.     LISINOPRIL (PRINIVIL,ZESTRIL) 20 MG TABLET    Take 20 mg by mouth daily.     METOPROLOL TARTRATE (LOPRESSOR) 25 MG TABLET    Take 12.5 mg by mouth 2 (two) times daily.     PREGABALIN (LYRICA) 75 MG CAPSULE    Take 75 mg by mouth as needed.     PROMETHAZINE (PHENERGAN) 12.5 MG TABLET    Take 12.5 mg by mouth every 6 (six) hours as needed.       Review of Systems  Constitutional:       Elderly. Frail.  HENT: Positive for hearing loss.   Eyes: Negative.   Respiratory: Negative for cough, chest tightness, shortness of breath and wheezing.   Cardiovascular: Negative for chest pain, palpitations and leg swelling.  Gastrointestinal: Negative for nausea, abdominal pain, diarrhea, blood in stool, abdominal distention and anal bleeding.  Endocrine: Negative.   Genitourinary: Negative.     Musculoskeletal: Positive for arthralgias, back pain and gait problem. Negative for joint swelling, myalgias, neck pain and neck stiffness.  Skin: Negative.   Allergic/Immunologic: Negative.   Neurological: Negative.   Psychiatric/Behavioral: Negative for hallucinations, behavioral problems, confusion, sleep disturbance and agitation. The patient is not nervous/anxious and is not hyperactive.     There were no vitals filed for this visit. Physical Exam  Constitutional: He is oriented to person, place, and time. He appears well-developed and well-nourished. No distress.  HENT:  Right Ear: External ear normal.  Left Ear: External ear normal.  Nose: Nose normal.  Mouth/Throat: Oropharynx is clear and moist. No oropharyngeal exudate.  Severe bilateral hearing loss  Eyes: Conjunctivae and EOM are normal. Pupils are equal, round, and reactive to light.  Neck: No JVD present. No tracheal deviation present. No thyromegaly present.  Cardiovascular: Normal rate, regular rhythm and intact distal pulses.  Exam reveals no gallop and no friction rub.   No murmur heard. 3/6 systolic ejection murmur at aortic area.  Pulmonary/Chest: No respiratory distress. He has no wheezes. He has no rales. He exhibits no tenderness.  Abdominal: He exhibits no distension and no mass. There is no tenderness.  Musculoskeletal: He exhibits no edema and no tenderness.  Unstable long-standing. Rides in a wheelchair.   Lymphadenopathy:    He has no cervical adenopathy.  Neurological: He is alert and oriented to person, place, and time. He has normal reflexes. No cranial nerve deficit. Coordination normal.  Skin: No rash noted. No erythema. No pallor.  Psychiatric: He has a normal mood and affect. His behavior is normal. Judgment and thought content normal.     Labs reviewed: Nursing Home on 06/21/2013  Component Date Value Ref Range Status  . Hemoglobin 03/09/2013 10.7* 13.5 - 17.5 g/dL Final  . HCT 16/10/960412/31/2014 32*  41 - 53 % Final  . Platelets 03/09/2013 183  150 - 399 K/L Final  . WBC 03/09/2013 5.5   Final  . BUN 03/09/2013 32* 4 - 21 mg/dL Final  . Creatinine 54/09/811912/31/2014 1.5* 0.6 - 1.3 mg/dL Final  . Potassium 14/78/295612/31/2014 4.5  3.4 - 5.3 mmol/L Final  . Sodium 03/09/2013 135* 137 - 147 mmol/L Final  . Alkaline Phosphatase 03/09/2013 95  25 - 125 U/L Final  . ALT 03/09/2013 9* 10 - 40 U/L Final  . AST 03/09/2013 19  14 - 40 U/L Final  . Bilirubin, Total 03/09/2013 0.4   Final  . TSH 03/09/2013  2.52  0.41 - 5.90 uIU/mL Final      Assessment/Plan  1. HYPERTENSION Controlled  2. Unspecified hypothyroidism Compensated  3. Chronic kidney disease, stage III (moderate) Unchanged  4. ANEMIA, IRON DEFICIENCY, CHRONIC Improved. Followup lab: Reticulocyte count, serum iron, iron binding capacity, CBC  5. BPH (benign prostatic hyperplasia) Stable  6. Deaf Interferes with communication  7. Lumbago Chronic and unchanged  8. VERTIGO Recent episodes  9. Depression with anxiety Improved on sertraline

## 2013-06-27 ENCOUNTER — Other Ambulatory Visit: Payer: Self-pay | Admitting: *Deleted

## 2013-06-27 MED ORDER — OXYCODONE HCL 5 MG PO TABA: ORAL_TABLET | ORAL | Status: DC

## 2013-06-27 NOTE — Telephone Encounter (Signed)
[facility] 

## 2013-07-07 ENCOUNTER — Other Ambulatory Visit: Payer: Self-pay | Admitting: *Deleted

## 2013-07-22 ENCOUNTER — Encounter: Payer: Self-pay | Admitting: *Deleted

## 2013-07-26 ENCOUNTER — Encounter: Payer: Self-pay | Admitting: Internal Medicine

## 2013-08-23 ENCOUNTER — Encounter: Payer: Medicare PPO | Admitting: Internal Medicine

## 2013-08-29 ENCOUNTER — Other Ambulatory Visit: Payer: Self-pay | Admitting: *Deleted

## 2013-08-29 MED ORDER — OXYCODONE HCL 5 MG PO TABA: ORAL_TABLET | ORAL | Status: DC

## 2013-08-29 NOTE — Telephone Encounter (Signed)
[facility] [place] [facility] 

## 2013-08-29 NOTE — Telephone Encounter (Signed)
[facility]

## 2013-08-29 NOTE — Telephone Encounter (Signed)
[facility] [place] 

## 2013-09-06 ENCOUNTER — Non-Acute Institutional Stay: Payer: Medicare PPO | Admitting: Internal Medicine

## 2013-09-06 ENCOUNTER — Encounter: Payer: Self-pay | Admitting: Internal Medicine

## 2013-09-06 VITALS — BP 122/64 | HR 72 | Ht 70.0 in | Wt 165.0 lb

## 2013-09-06 DIAGNOSIS — I10 Essential (primary) hypertension: Secondary | ICD-10-CM

## 2013-09-06 DIAGNOSIS — M545 Low back pain, unspecified: Secondary | ICD-10-CM

## 2013-09-06 DIAGNOSIS — R42 Dizziness and giddiness: Secondary | ICD-10-CM

## 2013-09-06 DIAGNOSIS — IMO0002 Reserved for concepts with insufficient information to code with codable children: Secondary | ICD-10-CM

## 2013-09-06 DIAGNOSIS — R269 Unspecified abnormalities of gait and mobility: Secondary | ICD-10-CM

## 2013-09-06 DIAGNOSIS — M4802 Spinal stenosis, cervical region: Secondary | ICD-10-CM | POA: Insufficient documentation

## 2013-09-06 NOTE — Progress Notes (Signed)
Patient ID: Ricardo GanserHerbert W Lorenzo, male   DOB: [birth year], 80 y.o.   MRN: [id number]    Location:  [facility]   Place of Service: Clinic (12)    Allergies  Allergen Reactions  . Indocin [Indomethacin]     Chief Complaint  Patient presents with  . Medical Management of Chronic Issues    back pain, wants to get back brace. Wearing a brace now from [facility]. Has a form for Lumbar Orthosis Brace     HPI:  Midline low back pain without sciatica: requeating a firmer brace. He finds the brace he is using currently is too weak. He does get some relief. Back pain worse in the lower back. Denies fall or other recent injury. Known OA and DDD of the lumbar spine.  Degeneration of intervertebral disc, site unspecified: increased pain  HYPERTENSION: controlled  Abnormality of gait; usoiing motorizedchair  VERTIGO: chronic. No change    Medications: Patient's Medications  New Prescriptions   No medications on file  Previous Medications   ACETAMINOPHEN PO    Take by mouth. 325mg  take 2 every 6 hours as needed   ASPIRIN 81 MG TABLET    Take 81 mg by mouth daily.     ATORVASTATIN (LIPITOR) 20 MG TABLET    Take 20 mg by mouth daily.   CHOLECALCIFEROL (VITAMIN D) 1000 UNITS TABLET    Take 2,000 Units by mouth daily.    DOCUSATE (COLACE) 50 MG/5ML LIQUID    Take by mouth. 3-5 drops in each ear weekly Sat/Sun at bedtime and flush afterwards   DOXAZOSIN (CARDURA) 4 MG TABLET    Take 1 tablet by mouth daily.   FERROUS SULFATE 325 (65 FE) MG TABLET    Take 1 tablet by mouth every day   FINASTERIDE (PROSCAR) 5 MG TABLET    Take 1 tablet by mouth daily.   FISH OIL-OMEGA-3 FATTY ACIDS 1000 MG CAPSULE    Take by mouth daily.     FLAXSEED, LINSEED, (FLAXSEED OIL PO)    Take by mouth daily.     FUROSEMIDE (LASIX) 20 MG TABLET    Take by mouth. Take 1/2 tablet every other day   LEVOTHYROXINE (SYNTHROID, LEVOTHROID) 50 MCG TABLET    Take 50 mcg by mouth every other day.   LOSARTAN (COZAAR) 25 MG TABLET     Take 1 tablet by mouth daily.   MECLIZINE (ANTIVERT) 25 MG TABLET    Take 25 mg by mouth 3 (three) times daily as needed.     MULTIPLE VITAMINS-MINERALS (I-VITE PO)    Take by mouth. One tablet daily for eyes   NUTRITIONAL SUPPLEMENTS (ENSURE PO)    Take by mouth 3 (three) times daily.     OMEPRAZOLE (PRILOSEC) 40 MG CAPSULE    Take 40 mg by mouth daily.   OXYBUTYNIN (DITROPAN-XL) 10 MG 24 HR TABLET    Take 1 tablet by mouth daily.   OXYCODONE HCL, ABUSE DETER, 5 MG TABA    Take one tablet by mouth every 8 hours as needed for pain   POLYETHYLENE GLYCOL POWDER (GLYCOLAX/MIRALAX) POWDER    17g once a day in liquid for constipation   POLYVINYL ALCOHOL (ARTIFICIAL TEARS) 1.4 % OPHTHALMIC SOLUTION    1 drop. In both eyes twice daily   SENNA-DOCUSATE (SENEXON-S) 8.6-50 MG PER TABLET    Take 1 tablet by mouth every other day.  Modified Medications   No medications on file  Discontinued Medications   No medications on  file     Review of Systems  Constitutional:       Elderly. Frail.  HENT: Positive for hearing loss.   Eyes: Negative.   Respiratory: Negative for cough, chest tightness, shortness of breath and wheezing.   Cardiovascular: Negative for chest pain, palpitations and leg swelling.  Gastrointestinal: Negative for nausea, abdominal pain, diarrhea, blood in stool, abdominal distention and anal bleeding.  Endocrine: Negative.   Genitourinary: Negative.   Musculoskeletal: Positive for arthralgias, back pain and gait problem. Negative for joint swelling, myalgias, neck pain and neck stiffness.  Skin: Negative.   Allergic/Immunologic: Negative.   Neurological: Negative.   Psychiatric/Behavioral: Negative for hallucinations, behavioral problems, confusion, sleep disturbance and agitation. The patient is not nervous/anxious and is not hyperactive.     Filed Vitals:   09/06/13 0843  BP: 122/64  Pulse: 72  Height: 5\' 10"  (1.778 m)  Weight: 165 lb (74.844 kg)   Body mass index is  23.68 kg/(m^2).  Physical Exam  Constitutional: He is oriented to person, place, and time. He appears well-developed and well-nourished. No distress.  HENT:  Right Ear: External ear normal.  Left Ear: External ear normal.  Nose: Nose normal.  Mouth/Throat: Oropharynx is clear and moist. No oropharyngeal exudate.  Severe bilateral hearing loss  Eyes: Conjunctivae and EOM are normal. Pupils are equal, round, and reactive to light.  Neck: No JVD present. No tracheal deviation present. No thyromegaly present.  Cardiovascular: Normal rate, regular rhythm and intact distal pulses.  Exam reveals no gallop and no friction rub.   No murmur heard. 3/6 systolic ejection murmur at aortic area.  Pulmonary/Chest: No respiratory distress. He has no wheezes. He has no rales. He exhibits no tenderness.  Abdominal: He exhibits no distension and no mass. There is no tenderness.  Musculoskeletal: He exhibits no edema and no tenderness.  Tender in th Low back with percussion.Unstable when standing. Rides in a wheelchair.   Lymphadenopathy:    He has no cervical adenopathy.  Neurological: He is alert and oriented to person, place, and time. He has normal reflexes. No cranial nerve deficit. Coordination normal.  Skin: No rash noted. No erythema. No pallor.  Psychiatric: He has a normal mood and affect. His behavior is normal. Judgment and thought content normal.     Labs reviewed: Nursing Home on 06/21/2013  Component Date Value Ref Range Status  . Hemoglobin 03/09/2013 10.7* 13.5 - 17.5 g/dL Final  . HCT 16/10/960412/31/2014 32* 41 - 53 % Final  . Platelets 03/09/2013 183  150 - 399 K/L Final  . WBC 03/09/2013 5.5   Final  . BUN 03/09/2013 32* 4 - 21 mg/dL Final  . Creatinine 54/09/811912/31/2014 1.5* 0.6 - 1.3 mg/dL Final  . Potassium 14/78/295612/31/2014 4.5  3.4 - 5.3 mmol/L Final  . Sodium 03/09/2013 135* 137 - 147 mmol/L Final  . Alkaline Phosphatase 03/09/2013 95  25 - 125 U/L Final  . ALT 03/09/2013 9* 10 - 40 U/L Final    . AST 03/09/2013 19  14 - 40 U/L Final  . Bilirubin, Total 03/09/2013 0.4   Final  . TSH 03/09/2013 2.52  0.41 - 5.90 uIU/mL Final      Assessment/Plan  1. Midline low back pain without sciatica Approved lumbar orthosis brace  2. Degeneration of intervertebral disc, site unspecified Use brace  3. HYPERTENSION controlled  4. Abnormality of gait Continue motorized chair  5. VERTIGO Chronic and unchanged

## 2013-09-14 ENCOUNTER — Other Ambulatory Visit: Payer: Self-pay | Admitting: *Deleted

## 2013-09-14 MED ORDER — OXYCODONE HCL 5 MG PO TABA: ORAL_TABLET | ORAL | Status: DC

## 2013-09-14 MED ORDER — OXYCODONE HCL 5 MG PO TABS: ORAL_TABLET | ORAL | Status: DC

## 2013-09-14 NOTE — Telephone Encounter (Signed)
[facility] Fax Order 

## 2013-11-18 ENCOUNTER — Other Ambulatory Visit: Payer: Self-pay | Admitting: *Deleted

## 2013-11-18 MED ORDER — OXYCODONE HCL 5 MG PO TABS: ORAL_TABLET | ORAL | Status: DC

## 2013-11-18 NOTE — Telephone Encounter (Signed)
[facility] Fax [facility]der 

## 2014-01-02 ENCOUNTER — Other Ambulatory Visit: Payer: Self-pay | Admitting: *Deleted

## 2014-01-02 MED ORDER — OXYCODONE HCL 5 MG PO TABS: ORAL_TABLET | ORAL | Status: DC

## 2014-01-02 NOTE — Telephone Encounter (Signed)
[facility] [place] 

## 2014-01-02 NOTE — Telephone Encounter (Signed)
[facility] Fax Order 

## 2014-01-27 ENCOUNTER — Other Ambulatory Visit: Payer: Self-pay | Admitting: *Deleted

## 2014-01-27 MED ORDER — POLYETHYLENE GLYCOL 3350 17 GM/SCOOP PO POWD: 0.5000 | Freq: Every day | ORAL | Status: DC

## 2014-02-03 LAB — BASIC METABOLIC PANEL
BUN: 36 mg/dL — AB (ref 4–21)
Creatinine: 1.4 mg/dL — AB (ref 0.6–1.3)
GLUCOSE: 79 mg/dL
Potassium: 5 mmol/L (ref 3.4–5.3)
SODIUM: 137 mmol/L (ref 137–147)

## 2014-02-06 ENCOUNTER — Other Ambulatory Visit: Payer: Self-pay | Admitting: *Deleted

## 2014-02-06 ENCOUNTER — Encounter: Payer: Self-pay | Admitting: Vascular Surgery

## 2014-02-06 DIAGNOSIS — Z01812 Encounter for preprocedural laboratory examination: Secondary | ICD-10-CM

## 2014-02-07 ENCOUNTER — Encounter: Payer: Self-pay | Admitting: Vascular Surgery

## 2014-02-07 ENCOUNTER — Ambulatory Visit
Admission: RE | Admit: 2014-02-07 | Discharge: 2014-02-07 | Disposition: A | Discharge status: Home / Self Care | Payer: Medicare PPO | Source: Ambulatory Visit | Attending: Vascular Surgery | Admitting: Vascular Surgery

## 2014-02-07 ENCOUNTER — Ambulatory Visit (INDEPENDENT_AMBULATORY_CARE_PROVIDER_SITE_OTHER): Payer: Medicare PPO | Admitting: Vascular Surgery

## 2014-02-07 ENCOUNTER — Other Ambulatory Visit: Payer: Self-pay | Admitting: Nurse Practitioner

## 2014-02-07 VITALS — BP 121/72 | HR 75 | Resp 18 | Ht 70.0 in | Wt 160.0 lb

## 2014-02-07 DIAGNOSIS — I714 Abdominal aortic aneurysm, without rupture, unspecified: Secondary | ICD-10-CM

## 2014-02-07 DIAGNOSIS — Z48812 Encounter for surgical aftercare following surgery on the circulatory system: Secondary | ICD-10-CM

## 2014-02-07 MED ORDER — IOHEXOL 350 MG/ML SOLN
80.0000 mL | Freq: Once | INTRAVENOUS | Status: AC | PRN
  Administered 2014-02-07: 80 mL via INTRAVENOUS

## 2014-02-07 NOTE — Progress Notes (Signed)
Subjective:     Patient ID: Ricardo GanserHerbert W [name], male   DOB: 09/13/1913, 80 y.o.   MRN: [id number]  HPI this 80 year old male returns for continued follow-up regarding his aortic stent graft which I placed in 2012. He has no new complaints today. He resides at friend[facility]. He has chronic back discomfort which has not changed. He has a known incomplete apposition of the graft in the area of the left renal artery with some pooling of contrast which has been present for the past few years but has not changed. The aneurysm sac diameter has remained in the 60-65 mm range with possibly a small type II endoleak but no other change in the sac diameter. He continues to ambulate with a walker and has no specific complaints today.  Past Medical History  Diagnosis Date  . Hypertension   . Hyperlipidemia   . Vertigo   . Hypothyroidism   . Fall   . GERD (gastroesophageal reflux disease)   . Anemia   . Hyponatremia   . Hypercalcemia   . AAA (abdominal aortic aneurysm)   . Renal failure, acute   . Infectious colitis   . Fecal impaction   . Hiatal hernia   . Arthritis   . Joint pain   . Ulcer     Stomach  . Leg pain     with walking  . Swelling, mass, or lump in head and neck 01/03/2011  . Other specified cardiac dysrhythmias(427.89) 12/24/2010  . Lumbago 12/24/2010  . Orthostatic hypotension 11/05/2010  . Urinary frequency 09/13/2010  . Unspecified vitamin D deficiency 08/06/2010  . Pain in joint, pelvic region and thigh 08/06/2010  . Anxiety state, unspecified 08/01/2010  . Unspecified hearing loss 08/01/2010  . Congestive heart failure, unspecified 08/01/2010  . Peptic ulcer, unspecified site, unspecified as acute or chronic, without mention of hemorrhage, perforation, or obstruction 08/01/2010  . Unspecified disorder of kidney and ureter 08/01/2010  . Hypertrophy of prostate without urinary obstruction and other lower urinary tract symptoms (LUTS) 08/01/2010  . Adhesive capsulitis of shoulder  08/01/2010    "Frozen" right shoulder  . Disorders of bursae and tendons in shoulder region, unspecified 08/01/2010  . Insomnia, unspecified 08/01/2010  . Abnormality of gait 08/01/2010  . Edema 08/01/2010    History  Substance Use Topics  . Smoking status: Never Smoker   . Smokeless tobacco: Never Used  . Alcohol Use: No    Family History  Problem Relation Age of Onset  . Heart failure Brother     Allergies  Allergen Reactions  . Indocin [Indomethacin]     Current outpatient prescriptions: ACETAMINOPHEN PO, Take by mouth. 325mg  take 2 every 6 hours as needed, Disp: , Rfl: ;  aspirin 81 MG tablet, Take 81 mg by mouth daily.  , Disp: , Rfl: ;  atorvastatin (LIPITOR) 20 MG tablet, Take 20 mg by mouth daily., Disp: , Rfl: ;  cholecalciferol (VITAMIN D) 1000 UNITS tablet, Take 2,000 Units by mouth daily. , Disp: , Rfl:  docusate (COLACE) 50 MG/5ML liquid, Take by mouth. 3-5 drops in each ear weekly Sat/Sun at bedtime and flush afterwards, Disp: , Rfl: ;  doxazosin (CARDURA) 4 MG tablet, Take 1 tablet by mouth daily., Disp: , Rfl: ;  ferrous sulfate 325 (65 FE) MG tablet, Take 1 tablet by mouth every day, Disp: , Rfl: ;  finasteride (PROSCAR) 5 MG tablet, Take 1 tablet by mouth daily., Disp: , Rfl:  fish oil-omega-3 fatty acids 1000 MG capsule,  Take by mouth daily.  , Disp: , Rfl: ;  Flaxseed, Linseed, (FLAXSEED OIL PO), Take by mouth daily.  , Disp: , Rfl: ;  furosemide (LASIX) 20 MG tablet, Take by mouth. Take 1/2 tablet every other day, Disp: , Rfl: ;  levothyroxine (SYNTHROID, LEVOTHROID) 50 MCG tablet, Take 50 mcg by mouth every other day., Disp: , Rfl: ;  losartan (COZAAR) 25 MG tablet, Take 1 tablet by mouth daily., Disp: , Rfl:  meclizine (ANTIVERT) 25 MG tablet, Take 25 mg by mouth 3 (three) times daily as needed.  , Disp: , Rfl: ;  Multiple Vitamins-Minerals (I-VITE PO), Take by mouth. One tablet daily for eyes, Disp: , Rfl: ;  Nutritional Supplements (ENSURE PO), Take by mouth 3 (three)  times daily.  , Disp: , Rfl: ;  omeprazole (PRILOSEC) 40 MG capsule, Take 40 mg by mouth daily., Disp: , Rfl:  oxybutynin (DITROPAN-XL) 10 MG 24 hr tablet, Take 1 tablet by mouth daily., Disp: , Rfl: ;  oxyCODONE (ROXICODONE) 5 MG immediate release tablet, Take one tablet by mouth twice daily for pain, Disp: 60 tablet, Rfl: 0;  oxyCODONE (ROXICODONE) 5 MG immediate release tablet, Take one tablet by mouth twice daily for pain; Take one tablet by mouth twice daily as needed for breakthrough pain, Disp: 120 tablet, Rfl: 0 polyethylene glycol powder (GLYCOLAX/MIRALAX) powder, Take 127.5 g by mouth daily. 17g once a day in liquid for constipation., Disp: 17 g, Rfl: 91;  polyvinyl alcohol (ARTIFICIAL TEARS) 1.4 % ophthalmic solution, 1 drop. In both eyes twice daily, Disp: , Rfl: ;  senna-docusate (SENEXON-S) 8.6-50 MG per tablet, Take 1 tablet by mouth every other day., Disp: , Rfl:   BP 121/72 mmHg  Pulse 75  Resp 18  Ht 5\' 10"  (1.778 m)  Wt 160 lb (72.576 kg)  BMI 22.96 kg/m2  Body mass index is 22.96 kg/(m^2).         Review of Systems his biggest complaint is poor hearing, chronic back pain, occasional congestion in his lungs, some occasional reflux esophagitis type symptoms, unstable gait-walks with a walker. Otherwise no specific complaints and complete review of systems     Objective:   Physical Exam BP 121/72 mmHg  Pulse 75  Resp 18  Ht 5\' 10"  (1.778 m)  Wt 160 lb (72.576 kg)  BMI 22.96 kg/m2  Gen.-alert and oriented x3 in no apparent distress-elderly and frail HEENT normal for age Lungs no rhonchi or wheezing Cardiovascular regular rhythm no murmurs carotid pulses 3+ palpable no bruits audible Abdomen soft nontender no palpable masses Musculoskeletal free of  major deformities Skin clear -no rashes Neurologic normal Lower extremities 3+ femoral and dorsalis pedis pulses palpable bilaterally with no edema  Today I ordered a CT angiogram of the abdomen and pelvis which I  reviewed by computer. I compared this to the previous few CT scans over the past few years. He continues to have incomplete apposition of the graft to the native aorta at the level of the left renal artery with significant angulation of the neck. This has not changed over the past 3 studies. There is a small type II endoleak. The sac diameter continues to be a maximum of around 64-65 mm-roughly the same as last year.       Assessment:     3 years post aortic stent graft for large abdominal aortic aneurysm. Incomplete apposition of graft to left aortic wall near left renal artery with some pooling of contrast which has been unchanged  over the past few years. No change in sac diameter    Plan:     I discussed with patient discontinuing annual CT scans because of his age-53. He states he would like to continue to be followed on an annual basis. I have therefore scheduled him to return in one year with a CT angiogram of the abdomen and pelvis for continued follow-up.

## 2014-02-07 NOTE — Addendum Note (Signed)
Addended by: MCCHESNEY, MARILYN K on: 02/07/2014 05:16 PM   Modules accepted: Orders  

## 2014-03-22 ENCOUNTER — Other Ambulatory Visit: Payer: Self-pay | Admitting: *Deleted

## 2014-03-22 MED ORDER — OXYCODONE HCL ER 10 MG PO T12A: EXTENDED_RELEASE_TABLET | ORAL | Status: DC

## 2014-03-22 NOTE — Telephone Encounter (Signed)
[facility] [place] 

## 2014-04-27 ENCOUNTER — Other Ambulatory Visit: Payer: Self-pay | Admitting: *Deleted

## 2014-04-27 MED ORDER — OXYCODONE HCL 5 MG PO TABS: ORAL_TABLET | ORAL | Status: DC

## 2014-04-27 NOTE — Telephone Encounter (Signed)
[facility] Fax Order 

## 2014-05-16 ENCOUNTER — Other Ambulatory Visit: Payer: Self-pay | Admitting: *Deleted

## 2014-05-16 MED ORDER — OXYCODONE HCL ER 10 MG PO T12A: EXTENDED_RELEASE_TABLET | ORAL | Status: DC

## 2014-05-16 NOTE — Telephone Encounter (Signed)
[facility] [place] # [phone] Fax: [phone] 

## 2014-06-15 ENCOUNTER — Other Ambulatory Visit: Payer: Self-pay | Admitting: *Deleted

## 2014-06-15 MED ORDER — OXYCODONE HCL ER 10 MG PO T12A: EXTENDED_RELEASE_TABLET | ORAL | Status: DC

## 2014-06-15 NOTE — Telephone Encounter (Signed)
[facility] [place] 

## 2014-07-17 ENCOUNTER — Other Ambulatory Visit: Payer: Self-pay | Admitting: *Deleted

## 2014-07-17 MED ORDER — OXYCODONE HCL ER 10 MG PO T12A: EXTENDED_RELEASE_TABLET | ORAL | Status: DC

## 2014-07-17 NOTE — Telephone Encounter (Signed)
[facility]  [place]

## 2014-07-27 LAB — HEPATIC FUNCTION PANEL
ALK PHOS: 86 U/L (ref 25–125)
ALT: 11 U/L (ref 10–40)
AST: 18 U/L (ref 14–40)
Bilirubin, Total: 0.6 mg/dL

## 2014-07-27 LAB — BASIC METABOLIC PANEL
BUN: 30 mg/dL — AB (ref 4–21)
Creatinine: 1.3 mg/dL (ref 0.6–1.3)
GLUCOSE: 115 mg/dL
Potassium: 4.8 mmol/L (ref 3.4–5.3)
Sodium: 138 mmol/L (ref 137–147)

## 2014-07-27 LAB — CBC AND DIFFERENTIAL
HCT: 32 % — AB (ref 41–53)
Hemoglobin: 10.6 g/dL — AB (ref 13.5–17.5)
Platelets: 153 K/µL (ref 150–399)
WBC: 5.8 10*3/mL

## 2014-07-27 LAB — TSH: TSH: 2.68 u[IU]/mL (ref 0.41–5.90)

## 2014-07-28 ENCOUNTER — Other Ambulatory Visit: Payer: Self-pay | Admitting: Nurse Practitioner

## 2014-08-14 ENCOUNTER — Other Ambulatory Visit: Payer: Self-pay | Admitting: *Deleted

## 2014-08-14 MED ORDER — OXYCODONE HCL ER 10 MG PO T12A: EXTENDED_RELEASE_TABLET | ORAL | Status: DC

## 2014-08-14 NOTE — Telephone Encounter (Signed)
[facility] [place] 

## 2014-09-08 ENCOUNTER — Encounter: Payer: Self-pay | Admitting: Nurse Practitioner

## 2014-09-08 ENCOUNTER — Non-Acute Institutional Stay: Payer: Medicare PPO | Admitting: Nurse Practitioner

## 2014-09-08 DIAGNOSIS — K5900 Constipation, unspecified: Secondary | ICD-10-CM

## 2014-09-08 DIAGNOSIS — E039 Hypothyroidism, unspecified: Secondary | ICD-10-CM | POA: Diagnosis not present

## 2014-09-08 DIAGNOSIS — M5440 Lumbago with sciatica, unspecified side: Secondary | ICD-10-CM

## 2014-09-08 DIAGNOSIS — N400 Enlarged prostate without lower urinary tract symptoms: Secondary | ICD-10-CM

## 2014-09-08 DIAGNOSIS — K219 Gastro-esophageal reflux disease without esophagitis: Secondary | ICD-10-CM | POA: Diagnosis not present

## 2014-09-08 DIAGNOSIS — M4802 Spinal stenosis, cervical region: Secondary | ICD-10-CM | POA: Diagnosis not present

## 2014-09-08 DIAGNOSIS — I10 Essential (primary) hypertension: Secondary | ICD-10-CM

## 2014-09-08 DIAGNOSIS — R42 Dizziness and giddiness: Secondary | ICD-10-CM | POA: Diagnosis not present

## 2014-09-08 DIAGNOSIS — F418 Other specified anxiety disorders: Secondary | ICD-10-CM | POA: Diagnosis not present

## 2014-09-08 NOTE — Assessment & Plan Note (Signed)
Stable on Sertraline 50mg daily.  

## 2014-09-08 NOTE — Assessment & Plan Note (Signed)
Controlled, takes Furosemide 10mg  qod, Losartan 6531m mg, Bun/creat 30/1.3 07/27/14

## 2014-09-08 NOTE — Assessment & Plan Note (Signed)
Stable, takes Pantoprazole 40mg bid.   

## 2014-09-08 NOTE — Assessment & Plan Note (Signed)
Stable on Senokot S I qod and MiraLax daily.

## 2014-09-08 NOTE — Assessment & Plan Note (Addendum)
On and off tinglings in the left fingers-can be relieved by touching his forehead with left fingers.  The patient declined Gabapentin or Neurology referral

## 2014-09-08 NOTE — Assessment & Plan Note (Signed)
Takes Levothyroxine 50mcg, last TSH 2.68 07/27/14

## 2014-09-08 NOTE — Assessment & Plan Note (Signed)
No urinary retention, takes Oxybutynin 10mg daily, Finasteride 5mg daily, Doxazosin 4mg daily   

## 2014-09-08 NOTE — Assessment & Plan Note (Signed)
Chronic, w/c for mobility.

## 2014-09-08 NOTE — Progress Notes (Signed)
Patient ID: Ricardo [name], male   DOB: 10-23-1913, 81 y.o.   MRN: [id number]   Code Status: DNR  Allergies  Allergen Reactions  . Indocin [Indomethacin]     Chief Complaint  Patient presents with  . Medical Management of Chronic Issues  . Acute Visit    tingling sensation in the left fingers    HPI: Patient is a 81 y.o. male seen in the AL at [facility] today for  evaluation of tingling sensation in the left fingers and other chronic medical conditions Problem List Items Addressed This Visit    Essential hypertension    Controlled, takes Furosemide 10mg  qod, Losartan 35m mg, Bun/creat 30/1.3 07/27/14       GERD    Stable, takes Pantoprazole 40mg  bid.         VERTIGO    07/25/14 Pharm: decrease Meclizine 25mg  from tid to bid.       Constipation    Stable on Senokot S I qod and MiraLax daily.        Hypothyroidism    Takes Levothyroxine , last TSH 2.68 07/27/14      BPH (benign prostatic hyperplasia)    No urinary retention, takes Oxybutynin 10mg  daily, Finasteride 5mg  daily, Doxazosin 4mg  daily         Depression with anxiety    Stable on Sertraline 50mg  daily.        Lumbago    Chronic, w/c for mobility.       Spinal stenosis in cervical region - Primary    On and off tinglings in the left fingers-can be relieved by touching his forehead with left fingers.  The patient declined Gabapentin or Neurology referral          Review of Systems:  Review of Systems  Constitutional:       Elderly. Frail.  HENT: Positive for hearing loss.   Eyes: Negative.   Respiratory: Negative for cough, chest tightness, shortness of breath and wheezing.   Cardiovascular: Negative for chest pain, palpitations and leg swelling.  Gastrointestinal: Negative for nausea, abdominal pain, diarrhea, blood in stool, abdominal distention and anal bleeding.  Endocrine: Negative.   Genitourinary: Negative.   Musculoskeletal: Positive for back pain, arthralgias and  gait problem. Negative for myalgias, joint swelling, neck pain and neck stiffness.  Skin: Negative.   Allergic/Immunologic: Negative.   Neurological: Negative.        On and off tinglings in the left fingers-can be relieved by touching his forehead with left fingers.   Psychiatric/Behavioral: Negative for hallucinations, behavioral problems, confusion, sleep disturbance and agitation. The patient is not nervous/anxious and is not hyperactive.      Past Medical History  Diagnosis Date  . Hypertension   . Hyperlipidemia   . Vertigo   . Hypothyroidism   . Fall   . GERD (gastroesophageal reflux disease)   . Anemia   . Hyponatremia   . Hypercalcemia   . AAA (abdominal aortic aneurysm)   . Renal failure, acute   . Infectious colitis   . Fecal impaction   . Hiatal hernia   . Arthritis   . Joint pain   . Ulcer     Stomach  . Leg pain     with walking  . Swelling, mass, or lump in head and neck 01/03/2011  . Other specified cardiac dysrhythmias(427.89) 12/24/2010  . Lumbago 12/24/2010  . Orthostatic hypotension 11/05/2010  . Urinary frequency 09/13/2010  . Unspecified vitamin D deficiency 08/06/2010  .  Pain in joint, pelvic region and thigh 08/06/2010  . Anxiety state, unspecified 08/01/2010  . Unspecified hearing loss 08/01/2010  . Congestive heart failure, unspecified 08/01/2010  . Peptic ulcer, unspecified site, unspecified as acute or chronic, without mention of hemorrhage, perforation, or obstruction 08/01/2010  . Unspecified disorder of kidney and ureter 08/01/2010  . Hypertrophy of prostate without urinary obstruction and other lower urinary tract symptoms (LUTS) 08/01/2010  . Adhesive capsulitis of shoulder 08/01/2010    "Frozen" right shoulder  . Disorders of bursae and tendons in shoulder region, unspecified 08/01/2010  . Insomnia, unspecified 08/01/2010  . Abnormality of gait 08/01/2010  . Edema 08/01/2010   Past Surgical History  Procedure Laterality Date  . Appendectomy    .  Cataract extraction    . Inguinal hernia repair Left 2005  . Transthoracic echocardiogram  07/19/2010     Left ventricle: There is hypokinesis of the inferior wall and  posterior wall. The EF is 35-40%. The cavity size was normal  . Abdominal aortic aneurysm repair  12-18-10    Stent graft repair of AAA  . Hemorrhoid surgery  2000   Social History:   reports that he has never smoked. He has never used smokeless tobacco. He reports that he does not drink alcohol or use illicit drugs.  Family History  Problem Relation Age of Onset  . Heart failure Brother     Medications: Patient's Medications  New Prescriptions   No medications on file  Previous Medications   ACETAMINOPHEN PO    Take by mouth. 325mg  take 2 every 6 hours as needed   ASPIRIN 81 MG TABLET    Take 81 mg by mouth daily.     ATORVASTATIN (LIPITOR) 20 MG TABLET    Take 20 mg by mouth daily.   CHOLECALCIFEROL (VITAMIN D) 1000 UNITS TABLET    Take 2,000 Units by mouth daily.    DOCUSATE (COLACE) 50 MG/5ML LIQUID    Take by mouth. 3-5 drops in each ear weekly Sat/Sun at bedtime and flush afterwards   DOXAZOSIN (CARDURA) 4 MG TABLET    Take 1 tablet by mouth daily.   FERROUS SULFATE 325 (65 FE) MG TABLET    Take 1 tablet by mouth every day   FINASTERIDE (PROSCAR) 5 MG TABLET    Take 1 tablet by mouth daily.   FISH OIL-OMEGA-3 FATTY ACIDS 1000 MG CAPSULE    Take by mouth daily.     FLAXSEED, LINSEED, (FLAXSEED OIL PO)    Take by mouth daily.     FUROSEMIDE (LASIX) 20 MG TABLET    Take by mouth. Take 1/2 tablet every other day   LEVOTHYROXINE (SYNTHROID, LEVOTHROID) 50 MCG TABLET    Take 50 mcg by mouth every other day.   LOSARTAN (COZAAR) 25 MG TABLET    Take 1 tablet by mouth daily.   MECLIZINE (ANTIVERT) 25 MG TABLET    Take 25 mg by mouth 3 (three) times daily as needed.     MULTIPLE VITAMINS-MINERALS (I-VITE PO)    Take by mouth. One tablet daily for eyes   NUTRITIONAL SUPPLEMENTS (ENSURE PO)    Take by mouth 3 (three)  times daily.     OMEPRAZOLE (PRILOSEC) 40 MG CAPSULE    Take 40 mg by mouth daily.   OXYBUTYNIN (DITROPAN-XL) 10 MG 24 HR TABLET    Take 1 tablet by mouth daily.   OXYCODONE (OXYCONTIN) 10 MG T12A 12 HR TABLET    Take one tablet by mouth  every morning for pain. Do not crush or chew.   OXYCODONE (ROXICODONE) 5 MG IMMEDIATE RELEASE TABLET    Take one tablet by mouth twice daily for pain; Take one tablet by mouth twice daily as needed for breakthrough pain   OXYCODONE (ROXICODONE) 5 MG IMMEDIATE RELEASE TABLET    Take one tablet by mouth twice daily as needed for breakthrough pain   POLYETHYLENE GLYCOL POWDER (GLYCOLAX/MIRALAX) POWDER    Take 127.5 g by mouth daily. 17g once a day in liquid for constipation.   POLYVINYL ALCOHOL (ARTIFICIAL TEARS) 1.4 % OPHTHALMIC SOLUTION    1 drop. In both eyes twice daily   SENNA-DOCUSATE (SENEXON-S) 8.6-50 MG PER TABLET    Take 1 tablet by mouth every other day.  Modified Medications   No medications on file  Discontinued Medications   No medications on file     Physical Exam: Physical Exam  Constitutional: He is oriented to person, place, and time. He appears well-developed and well-nourished. No distress.  HENT:  Right Ear: External ear normal.  Left Ear: External ear normal.  Nose: Nose normal.  Mouth/Throat: Oropharynx is clear and moist. No oropharyngeal exudate.  Severe bilateral hearing loss  Eyes: Conjunctivae and EOM are normal. Pupils are equal, round, and reactive to light.  Neck: No JVD present. No tracheal deviation present. No thyromegaly present.  Cardiovascular: Normal rate, regular rhythm and intact distal pulses.  Exam reveals no gallop and no friction rub.   No murmur heard. 3/6 systolic ejection murmur at aortic area.  Pulmonary/Chest: No respiratory distress. He has no wheezes. He has no rales. He exhibits no tenderness.  Abdominal: He exhibits no distension and no mass. There is no tenderness.  Musculoskeletal: He exhibits no  edema or tenderness.  Tender in th Low back with percussion.Unstable when standing. Rides in a wheelchair.   Lymphadenopathy:    He has no cervical adenopathy.  Neurological: He is alert and oriented to person, place, and time. He has normal reflexes. No cranial nerve deficit. Coordination normal.  Skin: No rash noted. No erythema. No pallor.  Psychiatric: He has a normal mood and affect. His behavior is normal. Judgment and thought content normal.    Filed Vitals:   09/08/14 1320  BP: 124/72  Pulse: 58  Temp: 97 F (36.1 C)  TempSrc: Tympanic  Resp: 18      Labs reviewed: Basic Metabolic Panel:  Recent Labs  40/98/11 07/27/14  NA 137 138  K 5.0 4.8  BUN 36* 30*  CREATININE 1.4* 1.3  TSH  --  2.68   Liver Function Tests:  Recent Labs  07/27/14  AST 18  ALT 11  ALKPHOS 86   No results for input(s): LIPASE, AMYLASE in the last 8760 hours. No results for input(s): AMMONIA in the last 8760 hours. CBC:  Recent Labs  07/27/14  WBC 5.8  HGB 10.6*  HCT 32*  PLT 153   Lipid Panel: No results for input(s): CHOL, HDL, LDLCALC, TRIG, CHOLHDL, LDLDIRECT in the last 8760 hours.   Past Procedures:  02/07/14 CT angio abd and pelvis.   IMPRESSION: 1. Patent bifurcated stent graft with persistent type 2 endoleak and continued enlargement of the native aneurysm sac, now 6.8 cm diameter. 2. Large hiatal hernia.  Assessment/Plan Spinal stenosis in cervical region On and off tinglings in the left fingers-can be relieved by touching his forehead with left fingers.  The patient declined Gabapentin or Neurology referral   Essential hypertension Controlled, takes Furosemide   qod, Losartan 53m mg, Bun/creat 30/1.3 07/27/14   GERD Stable, takes Pantoprazole 40mg  bid.     VERTIGO 07/25/14 Pharm: decrease Meclizine 25mg  from tid to bid.   Constipation Stable on Senokot S I qod and MiraLax daily.    Hypothyroidism Takes Levothyroxine , last TSH 2.68  07/27/14  BPH (benign prostatic hyperplasia) No urinary retention, takes Oxybutynin 10mg  daily, Finasteride 5mg  daily, Doxazosin 4mg  daily     Depression with anxiety Stable on Sertraline 50mg  daily.    Lumbago Chronic, w/c for mobility.     Family/ Staff Communication: observe the patient.   Goals of Care: AL  Labs/tests ordered: none

## 2014-09-08 NOTE — Assessment & Plan Note (Signed)
07/25/14 Pharm: decrease Meclizine 25mg  from tid to bid.

## 2014-09-19 ENCOUNTER — Non-Acute Institutional Stay: Payer: Medicare PPO | Admitting: Nurse Practitioner

## 2014-09-19 DIAGNOSIS — E039 Hypothyroidism, unspecified: Secondary | ICD-10-CM | POA: Diagnosis not present

## 2014-09-19 DIAGNOSIS — E1021 Type 1 diabetes mellitus with diabetic nephropathy: Secondary | ICD-10-CM | POA: Diagnosis not present

## 2014-09-19 DIAGNOSIS — K219 Gastro-esophageal reflux disease without esophagitis: Secondary | ICD-10-CM

## 2014-09-19 DIAGNOSIS — I10 Essential (primary) hypertension: Secondary | ICD-10-CM | POA: Diagnosis not present

## 2014-09-19 DIAGNOSIS — D500 Iron deficiency anemia secondary to blood loss (chronic): Secondary | ICD-10-CM

## 2014-09-19 DIAGNOSIS — R079 Chest pain, unspecified: Secondary | ICD-10-CM | POA: Diagnosis not present

## 2014-09-19 DIAGNOSIS — M5440 Lumbago with sciatica, unspecified side: Secondary | ICD-10-CM | POA: Diagnosis not present

## 2014-09-19 DIAGNOSIS — N400 Enlarged prostate without lower urinary tract symptoms: Secondary | ICD-10-CM

## 2014-09-19 DIAGNOSIS — K5900 Constipation, unspecified: Secondary | ICD-10-CM | POA: Diagnosis not present

## 2014-09-19 DIAGNOSIS — R42 Dizziness and giddiness: Secondary | ICD-10-CM

## 2014-09-19 DIAGNOSIS — F418 Other specified anxiety disorders: Secondary | ICD-10-CM | POA: Diagnosis not present

## 2014-09-19 LAB — CBC AND DIFFERENTIAL
HEMATOCRIT: 29 % — AB (ref 41–53)
Hemoglobin: 9.5 g/dL — AB (ref 13.5–17.5)
Platelets: 167 10*3/uL (ref 150–399)
WBC: 3.3 10*3/mL

## 2014-09-19 LAB — TSH: TSH: 2.58 u[IU]/mL (ref 0.41–5.90)

## 2014-09-19 NOTE — Assessment & Plan Note (Signed)
Takes Levothyroxine 50mcg, 09/19/14 TSH 2.578

## 2014-09-19 NOTE — Progress Notes (Signed)
Patient ID: Ricardo [name], male   DOB: 04-01-1913, 81 y.o.   MRN: [id number]   Code Status: DNR  Allergies  Allergen Reactions  . Indocin [Indomethacin]     Chief Complaint  Patient presents with  . Medical Management of Chronic Issues  . Acute Visit    cough and sternal area discomfort.     HPI: Patient is a 81 y.o. male seen in the AL at [facility] today for  evaluation of cough, sternal area discomfort, and other chronic medical conditions Problem List Items Addressed This Visit    Iron deficiency anemia due to chronic blood loss - Primary    712/16 Hgb 9.5, ho;d ASA, continue Fe 325mg  daily      Essential hypertension    Controlled, takes Furosemide 10mg  qod, Losartan 34m mg, Bun/creat 30/1.3 07/27/14, update CMP, hold Losartan for Bp <90/60      GERD    takes Pantoprazole 40mg  daily, c/o cough and sternal area discomfort, EKG, CXR, CBC, hold ASA, adding Carafate qid, observe the patient.       VERTIGO    07/25/14 Pharm: decrease Meclizine 25mg  from tid to bid.        Constipation    Stable on Senokot S I qod and MiraLax daily.         Hypothyroidism    Takes Levothyroxine , 09/19/14 TSH 2.578      BPH (benign prostatic hyperplasia)    No urinary retention, takes Oxybutynin 10mg  daily, Finasteride 5mg  daily, Doxazosin 4mg  daily          Depression with anxiety    Stable on Sertraline 50mg  daily.         Lumbago    Chronic, w/c for mobility, Oxycontin 10mg  qam          Review of Systems:  Review of Systems  Constitutional:       Elderly. Frail.  HENT: Positive for hearing loss.   Eyes: Negative.   Respiratory: Positive for cough. Negative for chest tightness, shortness of breath and wheezing.        Sternal area discomfort  Cardiovascular: Negative for chest pain, palpitations and leg swelling.  Gastrointestinal: Negative for nausea, abdominal pain, diarrhea, blood in stool, abdominal distention and anal bleeding.  Endocrine:  Negative.   Genitourinary: Negative.   Musculoskeletal: Positive for back pain, arthralgias and gait problem. Negative for myalgias, joint swelling, neck pain and neck stiffness.  Skin: Negative.   Allergic/Immunologic: Negative.   Neurological: Negative.        On and off tinglings in the left fingers-can be relieved by touching his forehead with left fingers.   Psychiatric/Behavioral: Negative for hallucinations, behavioral problems, confusion, sleep disturbance and agitation. The patient is not nervous/anxious and is not hyperactive.      Past Medical History  Diagnosis Date  . Hypertension   . Hyperlipidemia   . Vertigo   . Hypothyroidism   . Fall   . GERD (gastroesophageal reflux disease)   . Anemia   . Hyponatremia   . Hypercalcemia   . AAA (abdominal aortic aneurysm)   . Renal failure, acute   . Infectious colitis   . Fecal impaction   . Hiatal hernia   . Arthritis   . Joint pain   . Ulcer     Stomach  . Leg pain     with walking  . Swelling, mass, or lump in head and neck 01/03/2011  . Other specified cardiac dysrhythmias(427.89) 12/24/2010  .  Lumbago 12/24/2010  . Orthostatic hypotension 11/05/2010  . Urinary frequency 09/13/2010  . Unspecified vitamin D deficiency 08/06/2010  . Pain in joint, pelvic region and thigh 08/06/2010  . Anxiety state, unspecified 08/01/2010  . Unspecified hearing loss 08/01/2010  . Congestive heart failure, unspecified 08/01/2010  . Peptic ulcer, unspecified site, unspecified as acute or chronic, without mention of hemorrhage, perforation, or obstruction 08/01/2010  . Unspecified disorder of kidney and ureter 08/01/2010  . Hypertrophy of prostate without urinary obstruction and other lower urinary tract symptoms (LUTS) 08/01/2010  . Adhesive capsulitis of shoulder 08/01/2010    "Frozen" right shoulder  . Disorders of bursae and tendons in shoulder region, unspecified 08/01/2010  . Insomnia, unspecified 08/01/2010  . Abnormality of gait  08/01/2010  . Edema 08/01/2010   Past Surgical History  Procedure Laterality Date  . Appendectomy    . Cataract extraction    . Inguinal hernia repair Left 2005  . Transthoracic echocardiogram  07/19/2010     Left ventricle: There is hypokinesis of the inferior wall and  posterior wall. The EF is 35-40%. The cavity size was normal  . Abdominal aortic aneurysm repair  12-18-10    Stent graft repair of AAA  . Hemorrhoid surgery  2000   Social History:   reports that he has never smoked. He has never used smokeless tobacco. He reports that he does not drink alcohol or use illicit drugs.  Family History  Problem Relation Age of Onset  . Heart failure Brother     Medications: Patient's Medications  New Prescriptions   No medications on file  Previous Medications   ACETAMINOPHEN PO    Take by mouth. 325mg  take 2 every 6 hours as needed   ASPIRIN 81 MG TABLET    Take 81 mg by mouth daily.     ATORVASTATIN (LIPITOR) 20 MG TABLET    Take 20 mg by mouth daily.   CHOLECALCIFEROL (VITAMIN D) 1000 UNITS TABLET    Take 2,000 Units by mouth daily.    DOCUSATE (COLACE) 50 MG/5ML LIQUID    Take by mouth. 3-5 drops in each ear weekly Sat/Sun at bedtime and flush afterwards   DOXAZOSIN (CARDURA) 4 MG TABLET    Take 1 tablet by mouth daily.   FERROUS SULFATE 325 (65 FE) MG TABLET    Take 1 tablet by mouth every day   FINASTERIDE (PROSCAR) 5 MG TABLET    Take 1 tablet by mouth daily.   FISH OIL-OMEGA-3 FATTY ACIDS 1000 MG CAPSULE    Take by mouth daily.     FLAXSEED, LINSEED, (FLAXSEED OIL PO)    Take by mouth daily.     FUROSEMIDE (LASIX) 20 MG TABLET    Take by mouth. Take 1/2 tablet every other day   LEVOTHYROXINE (SYNTHROID, LEVOTHROID) 50 MCG TABLET    Take 50 mcg by mouth every other day.   LOSARTAN (COZAAR) 25 MG TABLET    Take 1 tablet by mouth daily.   MECLIZINE (ANTIVERT) 25 MG TABLET    Take 25 mg by mouth 3 (three) times daily as needed.     MULTIPLE VITAMINS-MINERALS (I-VITE PO)    Take  by mouth. One tablet daily for eyes   NUTRITIONAL SUPPLEMENTS (ENSURE PO)    Take by mouth 3 (three) times daily.     OMEPRAZOLE (PRILOSEC) 40 MG CAPSULE    Take 40 mg by mouth daily.   OXYBUTYNIN (DITROPAN-XL) 10 MG 24 HR TABLET    Take 1 tablet  by mouth daily.   OXYCODONE (OXYCONTIN) 10 MG T12A 12 HR TABLET    Take one tablet by mouth every morning for pain. Do not crush or chew.   OXYCODONE (ROXICODONE) 5 MG IMMEDIATE RELEASE TABLET    Take one tablet by mouth twice daily for pain; Take one tablet by mouth twice daily as needed for breakthrough pain   OXYCODONE (ROXICODONE) 5 MG IMMEDIATE RELEASE TABLET    Take one tablet by mouth twice daily as needed for breakthrough pain   POLYETHYLENE GLYCOL POWDER (GLYCOLAX/MIRALAX) POWDER    Take 127.5 g by mouth daily. 17g once a day in liquid for constipation.   POLYVINYL ALCOHOL (ARTIFICIAL TEARS) 1.4 % OPHTHALMIC SOLUTION    1 drop. In both eyes twice daily   SENNA-DOCUSATE (SENEXON-S) 8.6-50 MG PER TABLET    Take 1 tablet by mouth every other day.  Modified Medications   No medications on file  Discontinued Medications   No medications on file     Physical Exam: Physical Exam  Constitutional: He is oriented to person, place, and time. He appears well-developed and well-nourished. No distress.  HENT:  Right Ear: External ear normal.  Left Ear: External ear normal.  Nose: Nose normal.  Mouth/Throat: Oropharynx is clear and moist. No oropharyngeal exudate.  Severe bilateral hearing loss  Eyes: Conjunctivae and EOM are normal. Pupils are equal, round, and reactive to light.  Neck: No JVD present. No tracheal deviation present. No thyromegaly present.  Cardiovascular: Normal rate, regular rhythm and intact distal pulses.  Exam reveals no gallop and no friction rub.   No murmur heard. 3/6 systolic ejection murmur at aortic area.  Pulmonary/Chest: No respiratory distress. He has no wheezes. He has no rales. He exhibits no tenderness.    Abdominal: He exhibits no distension and no mass. There is no tenderness.  Musculoskeletal: He exhibits no edema or tenderness.  Tender in th Low back with percussion.Unstable when standing. Rides in a wheelchair.   Lymphadenopathy:    He has no cervical adenopathy.  Neurological: He is alert and oriented to person, place, and time. He has normal reflexes. No cranial nerve deficit. Coordination normal.  Skin: No rash noted. No erythema. No pallor.  Psychiatric: He has a normal mood and affect. His behavior is normal. Judgment and thought content normal.    Filed Vitals:   09/19/14 1132  BP: 134/66  Pulse: 58  Temp: 97.4 F (36.3 C)  TempSrc: Tympanic  Resp: 18      Labs reviewed: Basic Metabolic Panel:  Recent Labs  16/10/96 07/27/14 09/19/14  NA 137 138  --   K 5.0 4.8  --   BUN 36* 30*  --   CREATININE 1.4* 1.3  --   TSH  --  2.68 2.58   Liver Function Tests:  Recent Labs  07/27/14  AST 18  ALT 11  ALKPHOS 86   No results for input(s): LIPASE, AMYLASE in the last 8760 hours. No results for input(s): AMMONIA in the last 8760 hours. CBC:  Recent Labs  07/27/14 09/19/14  WBC 5.8 3.3  HGB 10.6* 9.5*  HCT 32* 29*  PLT 153 167   Lipid Panel: No results for input(s): CHOL, HDL, LDLCALC, TRIG, CHOLHDL, LDLDIRECT in the last 8760 hours.   Past Procedures:  02/07/14 angio abd and pelvis  IMPRESSION: 1. Patent bifurcated stent graft with persistent type 2 endoleak and continued enlargement of the native aneurysm sac, now 6.8 cm diameter. 2. Large hiatal hernia  Assessment/Plan Iron deficiency anemia due to chronic blood loss 712/16 Hgb 9.5, ho;d ASA, continue Fe  daily  Hypothyroidism Takes Levothyroxine , 09/19/14 TSH 2.578  Essential hypertension Controlled, takes Furosemide  qod, Losartan 32m mg, Bun/creat 30/1.3 07/27/14, update CMP, hold Losartan for Bp <90/60  GERD takes Pantoprazole  daily, c/o cough and sternal area  discomfort, EKG, CXR, CBC, hold ASA, adding Carafate qid, observe the patient.   Constipation Stable on Senokot S I qod and MiraLax daily.     BPH (benign prostatic hyperplasia) No urinary retention, takes Oxybutynin  daily, Finasteride  daily, Doxazosin  daily      Depression with anxiety Stable on Sertraline  daily.     Lumbago Chronic, w/c for mobility, Oxycontin  qam   VERTIGO 07/25/14 Pharm: decrease Meclizine  from tid to bid.      Family/ Staff Communication: observe the patient.   Goals of Care: AL  Labs/tests ordered: CBC, CMP, TSH, EKG, CXR

## 2014-09-19 NOTE — Assessment & Plan Note (Addendum)
712/16 Hgb 9.5, ho;d ASA, continue Fe  daily

## 2014-09-20 ENCOUNTER — Encounter: Payer: Self-pay | Admitting: Nurse Practitioner

## 2014-09-20 LAB — CBC AND DIFFERENTIAL
Hemoglobin: 9.5 g/dL — AB (ref 13.5–17.5)
WBC: 6.3 10^3/mL

## 2014-09-20 LAB — BASIC METABOLIC PANEL
BUN: 33 mg/dL — AB (ref 4–21)
Creatinine: 1.6 mg/dL — AB (ref 0.6–1.3)
Glucose: 94 mg/dL
Potassium: 5.2 mmol/L (ref 3.4–5.3)
Sodium: 133 mmol/L — AB (ref 137–147)

## 2014-09-20 LAB — HEPATIC FUNCTION PANEL
ALT: 8 U/L — AB (ref 10–40)
AST: 15 U/L (ref 14–40)
Alkaline Phosphatase: 94 U/L (ref 25–125)
Bilirubin, Total: 0.4 mg/dL

## 2014-09-20 NOTE — Assessment & Plan Note (Signed)
Controlled, takes Furosemide 10mg  qod, Losartan 7837m mg, Bun/creat 30/1.3 07/27/14, update CMP, hold Losartan for Bp <90/60

## 2014-09-20 NOTE — Assessment & Plan Note (Signed)
07/25/14 Pharm: decrease Meclizine 25mg  from tid to bid.

## 2014-09-20 NOTE — Assessment & Plan Note (Signed)
No urinary retention, takes Oxybutynin 10mg daily, Finasteride 5mg daily, Doxazosin 4mg daily   

## 2014-09-20 NOTE — Assessment & Plan Note (Signed)
Chronic, w/c for mobility, Oxycontin 10mg qam  

## 2014-09-20 NOTE — Assessment & Plan Note (Signed)
Stable on Senokot S I qod and MiraLax daily.

## 2014-09-20 NOTE — Assessment & Plan Note (Signed)
takes Pantoprazole  daily, c/o cough and sternal area discomfort, EKG, CXR, CBC, hold ASA, adding Carafate qid, observe the patient.

## 2014-09-20 NOTE — Assessment & Plan Note (Signed)
Stable on Sertraline 50mg daily.  

## 2014-09-21 DIAGNOSIS — H903 Sensorineural hearing loss, bilateral: Secondary | ICD-10-CM | POA: Diagnosis not present

## 2014-09-21 DIAGNOSIS — R1313 Dysphagia, pharyngeal phase: Secondary | ICD-10-CM | POA: Diagnosis not present

## 2014-09-21 DIAGNOSIS — H6123 Impacted cerumen, bilateral: Secondary | ICD-10-CM | POA: Diagnosis not present

## 2014-09-21 DIAGNOSIS — Z974 Presence of external hearing-aid: Secondary | ICD-10-CM | POA: Diagnosis not present

## 2014-09-22 ENCOUNTER — Other Ambulatory Visit (HOSPITAL_COMMUNITY): Payer: Self-pay | Admitting: Otolaryngology

## 2014-09-22 ENCOUNTER — Other Ambulatory Visit: Payer: Self-pay | Admitting: Nurse Practitioner

## 2014-09-22 ENCOUNTER — Encounter: Payer: Self-pay | Admitting: Nurse Practitioner

## 2014-09-22 ENCOUNTER — Non-Acute Institutional Stay: Payer: Medicare PPO | Admitting: Nurse Practitioner

## 2014-09-22 DIAGNOSIS — D500 Iron deficiency anemia secondary to blood loss (chronic): Secondary | ICD-10-CM | POA: Diagnosis not present

## 2014-09-22 DIAGNOSIS — R001 Bradycardia, unspecified: Secondary | ICD-10-CM

## 2014-09-22 DIAGNOSIS — K219 Gastro-esophageal reflux disease without esophagitis: Secondary | ICD-10-CM

## 2014-09-22 DIAGNOSIS — E039 Hypothyroidism, unspecified: Secondary | ICD-10-CM

## 2014-09-22 DIAGNOSIS — N183 Chronic kidney disease, stage 3 unspecified: Secondary | ICD-10-CM

## 2014-09-22 DIAGNOSIS — I10 Essential (primary) hypertension: Secondary | ICD-10-CM

## 2014-09-22 DIAGNOSIS — R1314 Dysphagia, pharyngoesophageal phase: Secondary | ICD-10-CM

## 2014-09-22 NOTE — Assessment & Plan Note (Addendum)
09/19/14 Na 133, K 5.2, Bun 33, creatinine 1.63 09/22/14 dc Furosemide, no apparent edema, resolved cough and epigastric discomfort. Update CMP

## 2014-09-22 NOTE — Assessment & Plan Note (Signed)
Takes Levothyroxine 50mcg, 09/19/14 TSH 2.578 

## 2014-09-22 NOTE — Assessment & Plan Note (Signed)
712/16 Hgb 9.5,  continue Fe 325mg  daily 09/22/14 improved epigastric discomfort after Carafate added, continued PPI, dc ASA given hx of gastric ulcer, recent epigastric discomfort, and dropping in Hgb R>B, repeat CBC

## 2014-09-22 NOTE — Assessment & Plan Note (Signed)
09/19/14 EKG heart rate 44-delay Cardiology referral. 09/22/14 AP 62 with a few skips, will repeat EKG.

## 2014-09-22 NOTE — Assessment & Plan Note (Signed)
takes Pantoprazole  daily, resolved cough and sternal area discomfort, off ASA, newly added Carafate qid, observe the patient.

## 2014-09-22 NOTE — Progress Notes (Signed)
Patient ID: Ezequiel GanserHerbert W Codner, male   DOB: 27-Nov-1913, 81 y.o.   MRN: [id number]   Location:  [facility] Provider:  Chipper OmanManXie Bert Ptacek NP  Code Status:  DNR Goals of care: Advanced Directive information    Chief Complaint  Patient presents with  . Medical Management of Chronic Issues  . Acute Visit    EKG showed HR 40s, f/u epigastric discomfort, dropped Hgb, elevated Bun/creat    HPI: Patient is a 81 y.o. male [facility] for slow heart rate 40 per EKG, he is asymptomatic, AP was 62 upon my visit today, Hx of gastric ulcer and recent c/o epigastric discomfort and cough which were resolved after ASA held and Carafate started. Hgb was down to 9.5 without apparent bleeding, Iron added. He also takes Furosemide 10mg  qod, creatinine was creeping up from 1.3 5/19 to 1.6 09/19/14. The patient stated he is feeling much better today.   Review of Systems:  Review of Systems  Constitutional: Negative for fever, weight loss and malaise/fatigue.       Elderly. Frail.  HENT: Positive for hearing loss.   Eyes: Negative.  Negative for discharge and redness.  Respiratory: Negative for cough, shortness of breath and wheezing.        Sternal area discomfort resolved  Cardiovascular: Positive for leg swelling. Negative for chest pain and palpitations.       Trace  Gastrointestinal: Negative for heartburn, nausea, vomiting, abdominal pain, diarrhea, constipation, blood in stool and melena.  Genitourinary: Positive for frequency.  Musculoskeletal: Positive for back pain. Negative for myalgias and neck pain.  Skin: Negative.  Negative for rash.  Neurological: Positive for tingling. Negative for focal weakness and weakness.       On and off tinglings in the left fingers-can be relieved by touching his forehead with left fingers.   Psychiatric/Behavioral: Negative for hallucinations. The patient is not nervous/anxious.     Past Medical History  Diagnosis Date  . Hypertension   .  Hyperlipidemia   . Vertigo   . Hypothyroidism   . Fall   . GERD (gastroesophageal reflux disease)   . Anemia   . Hyponatremia   . Hypercalcemia   . AAA (abdominal aortic aneurysm)   . Renal failure, acute   . Infectious colitis   . Fecal impaction   . Hiatal hernia   . Arthritis   . Joint pain   . Ulcer     Stomach  . Leg pain     with walking  . Swelling, mass, or lump in head and neck 01/03/2011  . Other specified cardiac dysrhythmias(427.89) 12/24/2010  . Lumbago 12/24/2010  . Orthostatic hypotension 11/05/2010  . Urinary frequency 09/13/2010  . Unspecified vitamin D deficiency 08/06/2010  . Pain in joint, pelvic region and thigh 08/06/2010  . Anxiety state, unspecified 08/01/2010  . Unspecified hearing loss 08/01/2010  . Congestive heart failure, unspecified 08/01/2010  . Peptic ulcer, unspecified site, unspecified as acute or chronic, without mention of hemorrhage, perforation, or obstruction 08/01/2010  . Unspecified disorder of kidney and ureter 08/01/2010  . Hypertrophy of prostate without urinary obstruction and other lower urinary tract symptoms (LUTS) 08/01/2010  . Adhesive capsulitis of shoulder 08/01/2010    "Frozen" right shoulder  . Disorders of bursae and tendons in shoulder region, unspecified 08/01/2010  . Insomnia, unspecified 08/01/2010  . Abnormality of gait 08/01/2010  . Edema 08/01/2010    Patient Active Problem List   Diagnosis Date Noted  .  Bradycardia by electrocardiogram 09/22/2014  . Spinal stenosis in cervical region 09/06/2013  . Deaf 06/21/2013  . Lumbago 06/21/2013  . Constipation 03/08/2013  . Hypothyroidism 03/08/2013  . BPH (benign prostatic hyperplasia) 03/08/2013  . Depression with anxiety 03/08/2013  . Fall at nursing home 03/08/2013  . Abdominal aneurysm without mention of rupture 02/01/2013  . AAA (abdominal aortic aneurysm) 01/27/2012  . Abnormality of gait 08/01/2010  . Unspecified hearing loss 08/01/2010  . OSTEOARTHROS UNSPEC WHETHER  GEN/LOC UNSPEC SITE 05/17/2009  . Iron deficiency anemia due to chronic blood loss 03/16/2009  . GERD 03/16/2009  . ULCER-GASTRIC 03/16/2009  . DIVERTICULOSIS-COLON 03/16/2009  . Hyperlipidemia 01/30/2009  . Iron deficiency anemia 01/30/2009  . Essential hypertension 01/30/2009  . VERTIGO 01/30/2009  . Chronic kidney disease, stage III (moderate) 01/30/2009    Allergies  Allergen Reactions  . Indocin [Indomethacin]     Medications: Patient's Medications  New Prescriptions   No medications on file  Previous Medications   ACETAMINOPHEN PO    Take by mouth. 325mg  take 2 every 6 hours as needed   ASPIRIN 81 MG TABLET    Take 81 mg by mouth daily.     ATORVASTATIN (LIPITOR) 20 MG TABLET    Take 20 mg by mouth daily.   CHOLECALCIFEROL (VITAMIN D) 1000 UNITS TABLET    Take 2,000 Units by mouth daily.    CLONIDINE (CATAPRES) 0.1 MG TABLET    Take 0.1 mg by mouth. Take one daily as needed for blood pressure > 180/100   DOCUSATE (COLACE) 50 MG/5ML LIQUID    3-5 drops in each ear weekly Sat/Sun at bedtime and flush afterwards   DOXAZOSIN (CARDURA) 4 MG TABLET    Take 1 tablet by mouth daily.   FERROUS SULFATE 325 (65 FE) MG TABLET    Take 1 tablet by mouth every day   FINASTERIDE (PROSCAR) 5 MG TABLET    Take 1 tablet by mouth daily.   FUROSEMIDE (LASIX) 20 MG TABLET    Take by mouth. Take 1/2 tablet every other day   LEVOTHYROXINE (SYNTHROID, LEVOTHROID) 50 MCG TABLET    Take 50 mcg by mouth every other day.   LOSARTAN (COZAAR) 25 MG TABLET    Take 1 tablet by mouth daily.   MECLIZINE (ANTIVERT) 25 MG TABLET    Take 25 mg by mouth 3 (three) times daily as needed.     MULTIPLE VITAMINS-MINERALS (I-VITE PO)    Take by mouth. One tablet daily for eyes   NUTRITIONAL SUPPLEMENTS (ENSURE PO)    Take by mouth 3 (three) times daily.     OMEPRAZOLE (PRILOSEC) 40 MG CAPSULE    Take 40 mg by mouth daily.   OXYBUTYNIN (DITROPAN-XL) 10 MG 24 HR TABLET    Take 1 tablet by mouth daily.   OXYCODONE  (OXYCONTIN) 10 MG T12A 12 HR TABLET    Take one tablet by mouth every morning for pain. Do not crush or chew.   OXYCODONE (ROXICODONE) 5 MG IMMEDIATE RELEASE TABLET    Take one tablet by mouth twice daily as needed for breakthrough pain   POLYETHYLENE GLYCOL POWDER (GLYCOLAX/MIRALAX) POWDER    Take 127.5 g by mouth daily. 17g once a day in liquid for constipation.   POLYVINYL ALCOHOL (ARTIFICIAL TEARS) 1.4 % OPHTHALMIC SOLUTION    1 drop. In both eyes twice daily   SENNA-DOCUSATE (SENEXON-S) 8.6-50 MG PER TABLET    Take 1 tablet by mouth every other day.  Modified Medications  No medications on file  Discontinued Medications   FISH OIL-OMEGA-3 FATTY ACIDS 1000 MG CAPSULE    Take by mouth daily.     FLAXSEED, LINSEED, (FLAXSEED OIL PO)    Take by mouth daily.     OXYCODONE (ROXICODONE) 5 MG IMMEDIATE RELEASE TABLET    Take one tablet by mouth twice daily for pain; Take one tablet by mouth twice daily as needed for breakthrough pain    Physical Exam: Filed Vitals:   09/22/14 1151  BP: 114/70  Pulse: 62  Temp: 98.2 F (36.8 C)  TempSrc: Tympanic  Resp: 16   There is no weight on file to calculate BMI.  Physical Exam  Constitutional: He is oriented to person, place, and time. He appears well-developed and well-nourished. No distress.  HENT:  Right Ear: External ear normal.  Left Ear: External ear normal.  Nose: Nose normal.  Mouth/Throat: Oropharynx is clear and moist. No oropharyngeal exudate.  Severe bilateral hearing loss  Eyes: Conjunctivae and EOM are normal. Pupils are equal, round, and reactive to light.  Neck: No JVD present. No tracheal deviation present. No thyromegaly present.  Cardiovascular: Normal rate, regular rhythm and intact distal pulses.  Exam reveals no gallop and no friction rub.   No murmur heard. 3/6 systolic ejection murmur at aortic area.  Pulmonary/Chest: No respiratory distress. He has no wheezes. He has no rales. He exhibits no tenderness.  Abdominal:  He exhibits no distension and no mass. There is no tenderness.  Musculoskeletal: He exhibits edema. He exhibits no tenderness.  Unstable when standing. Rides in a wheelchair. Minimal trace edema seen in ankles.   Lymphadenopathy:    He has no cervical adenopathy.  Neurological: He is alert and oriented to person, place, and time. He has normal reflexes. No cranial nerve deficit. Coordination normal.  Skin: No rash noted. No erythema. No pallor.  Psychiatric: He has a normal mood and affect. His behavior is normal. Judgment and thought content normal.    Labs reviewed: Basic Metabolic Panel:  Recent Labs  16/10/96 07/27/14 09/20/14  NA 137 138 133*  K 5.0 4.8 5.2  BUN 36* 30* 33*  CREATININE 1.4* 1.3 1.6*    Liver Function Tests:  Recent Labs  07/27/14 09/20/14  AST 18 15  ALT 11 8*  ALKPHOS 86 94    CBC:  Recent Labs  07/27/14 09/19/14 09/20/14  WBC 5.8 3.3 6.3  HGB 10.6* 9.5* 9.5*  HCT 32* 29*  --   PLT 153 167  --     Lab Results  Component Value Date   TSH 2.58 09/19/2014   No results found for: HGBA1C No results found for: CHOL, HDL, LDLCALC, LDLDIRECT, TRIG, CHOLHDL  Significant Diagnostic Results since last visit: EKG, CXR,   Patient Care Team: Kimber Relic, MD as PCP - General (Internal Medicine)  Assessment/Plan There are no diagnoses linked to this encounter.   Family/ staff Communication: delay Cardiology referral.   Labs/tests ordered:  CBC, CMP, EKG  [facility] Karia Ehresman NP Geriatrics [facility] [address], [place] [address] On Call:  [phone] & follow prompts after 5pm & weekends Office Phone:  [phone] Office Fax:  [phone]

## 2014-09-22 NOTE — Assessment & Plan Note (Signed)
Controlled, 09/19/14 CXR no acute cardiopulmonary pathology appreciated. Takes Losartan 3154m mg, Bun/creat trending up slowly, dc Furosemide

## 2014-09-25 DIAGNOSIS — I10 Essential (primary) hypertension: Secondary | ICD-10-CM | POA: Diagnosis not present

## 2014-09-26 ENCOUNTER — Non-Acute Institutional Stay: Payer: Medicare PPO | Admitting: Internal Medicine

## 2014-09-26 ENCOUNTER — Encounter: Payer: Self-pay | Admitting: Internal Medicine

## 2014-09-26 VITALS — BP 132/68 | HR 52 | Temp 97.8°F | Wt 156.0 lb

## 2014-09-26 DIAGNOSIS — D509 Iron deficiency anemia, unspecified: Secondary | ICD-10-CM

## 2014-09-26 DIAGNOSIS — N183 Chronic kidney disease, stage 3 unspecified: Secondary | ICD-10-CM

## 2014-09-26 DIAGNOSIS — I10 Essential (primary) hypertension: Secondary | ICD-10-CM | POA: Diagnosis not present

## 2014-09-26 DIAGNOSIS — R001 Bradycardia, unspecified: Secondary | ICD-10-CM | POA: Diagnosis not present

## 2014-09-26 DIAGNOSIS — M5440 Lumbago with sciatica, unspecified side: Secondary | ICD-10-CM

## 2014-09-26 NOTE — Progress Notes (Signed)
Patient ID: Ricardo [name], male   DOB: Feb 02, 1914, 81 y.o.   MRN: [id number]    HISTORY AND PHYSICAL  Location:  [facility]  Nursing Home Room Number: AL32 Place of Service: Clinic [phone])   Extended Emergency Contact Information Primary Emergency Contact: [name], [place] Home Phone: [phone] Relation: Son Secondary Emergency Contact: [name] Home Phone: [phone] Relation: Grandson  Advanced Directive information Does patient have an advance directive?: Yes, Type of Advance Directive: Healthcare Power of [place];Living will;Out of facility DNR (pink MOST or yellow form), Pre-existing out of facility DNR order (yellow form or pink MOST form): Pink MOST form placed in chart (order not valid for inpatient use)  Chief Complaint  Patient presents with  . Medical Management of Chronic Issues    back, chest pain, bradycardia, anemia    HPI:  Bradycardia by electrocardiogram: Recently noted to have bradycardia. Rhythm strips and EKG were done. They showed a rate of 44.  Iron deficiency anemia: Last recorded hemoglobin was 9.5 on 09/19/2014. MCV normal at 87.3.  Chronic kidney disease, stage III (moderate): BUN 33 and creatinine 1.63 on 09/19/2014.  Essential hypertension: Controlled  Midline low back pain with sciatica, sciatica laterality unspecified: Chronic and unchanged  Hypothyroidism: Compensated    Past Medical History  Diagnosis Date  . Hypertension   . Hyperlipidemia   . Vertigo   . Hypothyroidism   . Fall   . GERD (gastroesophageal reflux disease)   . Anemia   . Hyponatremia   . Hypercalcemia   . AAA (abdominal aortic aneurysm)   . Renal failure, acute   . Infectious colitis   . Fecal impaction   . Hiatal hernia   . Arthritis   . Joint pain   . Ulcer     Stomach  . Leg pain     with walking  . Swelling, mass, or lump in head and neck 01/03/2011  . Other  specified cardiac dysrhythmias(427.89) 12/24/2010  . Lumbago 12/24/2010  . Orthostatic hypotension 11/05/2010  . Urinary frequency 09/13/2010  . Unspecified vitamin D deficiency 08/06/2010  . Pain in joint, pelvic region and thigh 08/06/2010  . Anxiety state, unspecified 08/01/2010  . Unspecified hearing loss 08/01/2010  . Congestive heart failure, unspecified 08/01/2010  . Peptic ulcer, unspecified site, unspecified as acute or chronic, without mention of hemorrhage, perforation, or obstruction 08/01/2010  . Unspecified disorder of kidney and ureter 08/01/2010  . Hypertrophy of prostate without urinary obstruction and other lower urinary tract symptoms (LUTS) 08/01/2010  . Adhesive capsulitis of shoulder 08/01/2010    "Frozen" right shoulder  . Disorders of bursae and tendons in shoulder region, unspecified 08/01/2010  . Insomnia, unspecified 08/01/2010  . Abnormality of gait 08/01/2010  . Edema 08/01/2010    Past Surgical History  Procedure Laterality Date  . Appendectomy    . Cataract extraction    . Inguinal hernia repair Left 2005  . Transthoracic echocardiogram  07/19/2010     Left ventricle: There is hypokinesis of the inferior wall and  posterior wall. The EF is 35-40%. The cavity size was normal  . Abdominal aortic aneurysm repair  12-18-10    Stent graft repair of AAA  . Hemorrhoid surgery  2000    Patient Care Team: Kimber Relic, MD as PCP - General (Internal Medicine)  History   Social History  . Marital Status: Widowed    Spouse  Name: N/A  . Number of Children: N/A  . Years of Education: N/A   Occupational History  . previous owner of [employer]    Social History Main Topics  . Smoking status: Never Smoker   . Smokeless tobacco: Never Used  . Alcohol Use: 0.0 oz/week    0 Standard drinks or equivalent per week     Comment: 1 1/2 oz daily   . Drug Use: No  . Sexual Activity: No   Other Topics Concern  . Not on file   Social History Narrative   [facility] admitted to [facility] 09/06/10   Living Will   Widowed   Exercises: no   Power wheelchair   Alcohol 1 1/2 oz daily   Never smoked                 reports that he has never smoked. He has never used smokeless tobacco. He reports that he drinks alcohol. He reports that he does not use illicit drugs.  Family History  Problem Relation Age of Onset  . Heart failure Brother    Family Status  Relation Status Death Age  . Mother Deceased 797    old age  . Father Deceased 4075    surgical errors  . Son Alive     Immunization History  Administered Date(s) Administered  . Influenza-Unspecified 12/27/2012, 12/13/2013  . Pneumococcal Polysaccharide-23 03/10/2005  . Td 03/11/1999    Allergies  Allergen Reactions  . Indocin [Indomethacin]     Medications: Patient's Medications  New Prescriptions   No medications on file  Previous Medications   ACETAMINOPHEN PO    Take by mouth. 325mg  take 2 every 6 hours as needed   ASPIRIN 81 MG TABLET    Take 81 mg by mouth daily.     ATORVASTATIN (LIPITOR) 20 MG TABLET    Take 20 mg by mouth daily.   CHOLECALCIFEROL (VITAMIN D) 1000 UNITS TABLET    Take 2,000 Units by mouth daily.    CLONIDINE (CATAPRES) 0.1 MG TABLET    Take 0.1 mg by mouth. Take one daily as needed for blood pressure > 180/100   DOCUSATE (COLACE) 50 MG/5ML LIQUID    3-5 drops in each ear weekly Sat/Sun at bedtime and flush afterwards   DOXAZOSIN (CARDURA) 4 MG TABLET    Take 1 tablet by mouth daily.   FERROUS SULFATE 325 (65 FE) MG TABLET    Take 1 tablet by mouth every day   FINASTERIDE (PROSCAR) 5 MG TABLET    Take 1 tablet by mouth daily.   FUROSEMIDE (LASIX) 20 MG TABLET    Take by mouth. Take 1/2 tablet every other day   LEVOTHYROXINE (SYNTHROID, LEVOTHROID) 50 MCG TABLET    Take 50 mcg by mouth every other day.   LOSARTAN (COZAAR) 25 MG TABLET    Take 1 tablet by mouth daily.   MECLIZINE (ANTIVERT) 25 MG TABLET    Take 25 mg by mouth 3 (three) times  daily as needed.     MULTIPLE VITAMINS-MINERALS (I-VITE PO)    Take by mouth. One tablet daily for eyes   NUTRITIONAL SUPPLEMENTS (ENSURE PO)    Take by mouth 3 (three) times daily.     OMEPRAZOLE (PRILOSEC) 40 MG CAPSULE    Take 40 mg by mouth daily.   OXYBUTYNIN (DITROPAN-XL) 10 MG 24 HR TABLET    Take 1 tablet by mouth daily.   OXYCODONE (OXYCONTIN)  10 MG T12A 12 HR TABLET    Take one tablet by mouth every morning for pain. Do not crush or chew.   OXYCODONE (ROXICODONE) 5 MG IMMEDIATE RELEASE TABLET    Take one tablet by mouth twice daily as needed for breakthrough pain   POLYETHYLENE GLYCOL POWDER (GLYCOLAX/MIRALAX) POWDER    Take 127.5 g by mouth daily. 17g once a day in liquid for constipation.   POLYVINYL ALCOHOL (ARTIFICIAL TEARS) 1.4 % OPHTHALMIC SOLUTION    1 drop. In both eyes twice daily   SENNA-DOCUSATE (SENEXON-S) 8.6-50 MG PER TABLET    Take 1 tablet by mouth every other day.  Modified Medications   No medications on file  Discontinued Medications   FISH OIL-OMEGA-3 FATTY ACIDS 1000 MG CAPSULE    Take by mouth daily.     FLAXSEED, LINSEED, (FLAXSEED OIL PO)    Take by mouth daily.     OXYCODONE (ROXICODONE) 5 MG IMMEDIATE RELEASE TABLET    Take one tablet by mouth twice daily for pain; Take one tablet by mouth twice daily as needed for breakthrough pain    Review of Systems  Constitutional: Negative for fever.       Elderly. Frail.  HENT: Positive for hearing loss.   Eyes: Negative.  Negative for discharge and redness.  Respiratory: Negative for cough, chest tightness, shortness of breath and wheezing.        Sternal area discomfort resolved  Cardiovascular: Positive for leg swelling. Negative for chest pain and palpitations.       Bradycardia at 60 bpm.  Gastrointestinal: Negative for nausea, vomiting, abdominal pain, diarrhea, constipation, blood in stool, abdominal distention and anal bleeding.  Endocrine: Negative.   Genitourinary: Positive for frequency.    Musculoskeletal: Positive for back pain, arthralgias and gait problem. Negative for myalgias, joint swelling, neck pain and neck stiffness.  Skin: Negative.  Negative for rash.  Allergic/Immunologic: Negative.   Neurological: Negative.  Negative for weakness.       On and off tinglings in the left fingers-can be relieved by touching his forehead with left fingers.   Psychiatric/Behavioral: Negative for hallucinations, behavioral problems, confusion, sleep disturbance and agitation. The patient is not nervous/anxious and is not hyperactive.     Filed Vitals:   09/26/14 0915  BP: 132/68  Pulse: 52  Temp: 97.8 F (36.6 C)  TempSrc: Oral  Weight: 156 lb (70.761 kg)  SpO2: 99%   Body mass index is 22.38 kg/(m^2).  Physical Exam  Constitutional: He is oriented to person, place, and time. He appears well-developed and well-nourished. No distress.  HENT:  Right Ear: External ear normal.  Left Ear: External ear normal.  Nose: Nose normal.  Mouth/Throat: Oropharynx is clear and moist. No oropharyngeal exudate.  Severe bilateral hearing loss  Eyes: Conjunctivae and EOM are normal. Pupils are equal, round, and reactive to light.  Neck: No JVD present. No tracheal deviation present. No thyromegaly present.  Cardiovascular: Regular rhythm and intact distal pulses.  Exam reveals no gallop and no friction rub.   No murmur heard. Bradycardia at 60 bpm. 3/6 systolic ejection murmur at aortic area.  Pulmonary/Chest: No respiratory distress. He has no wheezes. He has no rales. He exhibits no tenderness.  Abdominal: He exhibits no distension and no mass. There is no tenderness.  Musculoskeletal: He exhibits no edema or tenderness.  Tender in the low back with percussion. Unstable when standing. Rides in a wheelchair.   Lymphadenopathy:    He has no cervical adenopathy.  Neurological: He is alert and oriented to person, place, and time. He has normal reflexes. No cranial nerve deficit.  Coordination normal.  Skin: No rash noted. No erythema. No pallor.  Psychiatric: He has a normal mood and affect. His behavior is normal. Judgment and thought content normal.     Labs reviewed: Lab on 09/22/2014  Component Date Value Ref Range Status  . Hemoglobin 09/20/2014 9.5* 13.5 - 17.5 g/dL Final  . WBC 16/12/9602 6.3   Final  . Glucose 09/20/2014 94   Final  . BUN 09/20/2014 33* 4 - 21 mg/dL Final  . Creatinine 54/11/8117 1.6* 0.6 - 1.3 mg/dL Final  . Potassium 14/78/2956 5.2  3.4 - 5.3 mmol/L Final  . Sodium 09/20/2014 133* 137 - 147 mmol/L Final  . Alkaline Phosphatase 09/20/2014 94  25 - 125 U/L Final  . ALT 09/20/2014 8* 10 - 40 U/L Final  . AST 09/20/2014 15  14 - 40 U/L Final  . Bilirubin, Total 09/20/2014 0.4   Final  Nursing Home on 09/19/2014  Component Date Value Ref Range Status  . Hemoglobin 09/19/2014 9.5* 13.5 - 17.5 g/dL Final  . HCT 21/30/8657 29* 41 - 53 % Final  . Platelets 09/19/2014 167  150 - 399 K/L Final  . WBC 09/19/2014 3.3   Final  . TSH 09/19/2014 2.58  0.41 - 5.90 uIU/mL Final  Lab on 07/28/2014  Component Date Value Ref Range Status  . Hemoglobin 07/27/2014 10.6* 13.5 - 17.5 g/dL Final  . HCT 84/69/6295 32* 41 - 53 % Final  . Platelets 07/27/2014 153  150 - 399 K/L Final  . WBC 07/27/2014 5.8   Final  . Glucose 07/27/2014 115   Final  . BUN 07/27/2014 30* 4 - 21 mg/dL Final  . Creatinine 28/41/3244 1.3  0.6 - 1.3 mg/dL Final  . Potassium 03/12/7251 4.8  3.4 - 5.3 mmol/L Final  . Sodium 07/27/2014 138  137 - 147 mmol/L Final  . Alkaline Phosphatase 07/27/2014 86  25 - 125 U/L Final  . ALT 07/27/2014 11  10 - 40 U/L Final  . AST 07/27/2014 18  14 - 40 U/L Final  . Bilirubin, Total 07/27/2014 0.6   Final  . TSH 07/27/2014 2.68  0.41 - 5.90 uIU/mL Final   Assessment/Plan  1. Bradycardia by electrocardiogram Continue current medications.  2. Iron deficiency anemia Continue to monitor periodically.  3. Chronic kidney disease, stage  III (moderate) Unchanged  4. Essential hypertension Controlled  5. Midline low back pain with sciatica, sciatica laterality unspecified Unchanged

## 2014-09-27 ENCOUNTER — Ambulatory Visit (HOSPITAL_COMMUNITY)
Admission: RE | Admit: 2014-09-27 | Discharge: 2014-09-27 | Disposition: A | Discharge status: Home / Self Care | Payer: Medicare PPO | Source: Ambulatory Visit | Attending: Otolaryngology | Admitting: Otolaryngology

## 2014-09-27 DIAGNOSIS — R1314 Dysphagia, pharyngoesophageal phase: Secondary | ICD-10-CM | POA: Insufficient documentation

## 2014-09-27 DIAGNOSIS — K219 Gastro-esophageal reflux disease without esophagitis: Secondary | ICD-10-CM | POA: Diagnosis not present

## 2014-09-27 DIAGNOSIS — R1310 Dysphagia, unspecified: Secondary | ICD-10-CM | POA: Diagnosis not present

## 2014-09-28 DIAGNOSIS — I10 Essential (primary) hypertension: Secondary | ICD-10-CM | POA: Diagnosis not present

## 2014-09-28 DIAGNOSIS — I509 Heart failure, unspecified: Secondary | ICD-10-CM | POA: Diagnosis not present

## 2014-10-04 ENCOUNTER — Encounter: Payer: Self-pay | Admitting: Internal Medicine

## 2014-10-04 NOTE — Addendum Note (Signed)
Addended by: Kimber Relic on: 10/04/2014 10:51 AM   Modules accepted: Level of Service

## 2014-10-04 NOTE — Progress Notes (Signed)
This encounter was created in error - please disregard.

## 2014-10-13 ENCOUNTER — Other Ambulatory Visit: Payer: Self-pay | Admitting: *Deleted

## 2014-10-13 MED ORDER — OXYCODONE HCL ER 10 MG PO T12A: EXTENDED_RELEASE_TABLET | ORAL | Status: DC

## 2014-10-13 NOTE — Telephone Encounter (Signed)
[facility] Fax Order 

## 2014-11-09 DIAGNOSIS — D539 Nutritional anemia, unspecified: Secondary | ICD-10-CM | POA: Diagnosis not present

## 2014-11-09 LAB — CBC AND DIFFERENTIAL
HCT: 29 % — AB (ref 41–53)
Hemoglobin: 9.7 g/dL — AB (ref 13.5–17.5)
PLATELETS: 153 10*3/uL (ref 150–399)
WBC: 5.7 10*3/mL

## 2014-11-10 ENCOUNTER — Other Ambulatory Visit: Payer: Self-pay | Admitting: Nurse Practitioner

## 2014-11-10 ENCOUNTER — Encounter: Payer: Self-pay | Admitting: Gastroenterology

## 2014-11-10 DIAGNOSIS — D500 Iron deficiency anemia secondary to blood loss (chronic): Secondary | ICD-10-CM

## 2014-12-11 DIAGNOSIS — I10 Essential (primary) hypertension: Secondary | ICD-10-CM | POA: Diagnosis not present

## 2014-12-11 DIAGNOSIS — E559 Vitamin D deficiency, unspecified: Secondary | ICD-10-CM | POA: Diagnosis not present

## 2014-12-11 DIAGNOSIS — E039 Hypothyroidism, unspecified: Secondary | ICD-10-CM | POA: Diagnosis not present

## 2014-12-11 DIAGNOSIS — I509 Heart failure, unspecified: Secondary | ICD-10-CM | POA: Diagnosis not present

## 2014-12-11 LAB — CBC AND DIFFERENTIAL
HEMATOCRIT: 30 % — AB (ref 41–53)
HEMOGLOBIN: 10.2 g/dL — AB (ref 13.5–17.5)
Platelets: 157 10*3/uL (ref 150–399)
WBC: 6 10^3/mL

## 2014-12-12 ENCOUNTER — Other Ambulatory Visit: Payer: Self-pay | Admitting: Nurse Practitioner

## 2014-12-12 DIAGNOSIS — D509 Iron deficiency anemia, unspecified: Secondary | ICD-10-CM

## 2014-12-12 DIAGNOSIS — D500 Iron deficiency anemia secondary to blood loss (chronic): Secondary | ICD-10-CM

## 2015-01-24 ENCOUNTER — Other Ambulatory Visit: Payer: Self-pay | Admitting: *Deleted

## 2015-01-24 MED ORDER — OXYCODONE HCL 5 MG PO TABS: ORAL_TABLET | ORAL | Status: DC

## 2015-01-24 NOTE — Telephone Encounter (Signed)
[facility] 

## 2015-02-12 ENCOUNTER — Other Ambulatory Visit: Payer: Self-pay | Admitting: *Deleted

## 2015-02-12 MED ORDER — OXYCODONE HCL ER 10 MG PO T12A: EXTENDED_RELEASE_TABLET | ORAL | Status: DC

## 2015-02-12 NOTE — Telephone Encounter (Signed)
[facility] 

## 2015-02-13 ENCOUNTER — Ambulatory Visit: Payer: Medicare PPO | Admitting: Vascular Surgery

## 2015-02-13 ENCOUNTER — Other Ambulatory Visit: Payer: Medicare PPO

## 2015-03-27 ENCOUNTER — Non-Acute Institutional Stay: Payer: Medicare PPO | Admitting: Internal Medicine

## 2015-03-27 VITALS — BP 110/60 | HR 82 | Temp 97.2°F | Resp 18 | Ht 70.0 in

## 2015-03-27 DIAGNOSIS — D500 Iron deficiency anemia secondary to blood loss (chronic): Secondary | ICD-10-CM

## 2015-03-27 DIAGNOSIS — E785 Hyperlipidemia, unspecified: Secondary | ICD-10-CM

## 2015-03-27 DIAGNOSIS — E039 Hypothyroidism, unspecified: Secondary | ICD-10-CM

## 2015-03-27 DIAGNOSIS — R682 Dry mouth, unspecified: Principal | ICD-10-CM

## 2015-03-27 DIAGNOSIS — K117 Disturbances of salivary secretion: Secondary | ICD-10-CM | POA: Diagnosis not present

## 2015-03-27 DIAGNOSIS — I10 Essential (primary) hypertension: Secondary | ICD-10-CM

## 2015-03-27 DIAGNOSIS — R609 Edema, unspecified: Secondary | ICD-10-CM | POA: Diagnosis not present

## 2015-03-27 NOTE — Progress Notes (Signed)
Patient ID: Ricardo [name], male   DOB: Apr 26, 1913, 82 y.o.   MRN: [id number]    [facility]     Place of Service: Clinic (12)     Allergies  Allergen Reactions  . Indocin [Indomethacin]     Chief Complaint  Patient presents with  . Medical Management of Chronic Issues    6 mo f/u- iron deficency, gums are real sore and dry mouth problems    HPI:  Xerostomia - he had all of his teeth removed. He is still in process of the complaints of dry mouth problems.  Essential hypertension - currently on multiple medications. Blood pressure is well controlled. One of the drugs is clonidine, which may aggravate dry mouth.  Dyslipidemia - controlled  Hypothyroidism, unspecified hypothyroidism type - controlled  Iron deficiency anemia due to chronic blood loss stable -   Edema, unspecified type - Approximately 1+ in both lower extremities.    Medications: Patient's Medications  New Prescriptions   No medications on file  Previous Medications   ACETAMINOPHEN PO    Take by mouth. '325mg'$  take 2 every 6 hours as needed   ASPIRIN 81 MG TABLET    Take 81 mg by mouth daily.     ATORVASTATIN (LIPITOR) 20 MG TABLET    Take 20 mg by mouth daily. Reported on 03/27/2015   BENZOCAINE (ORAJEL) 10 % MUCOSAL GEL    Use as directed 1 application in the mouth or throat as needed for mouth pain.   CHOLECALCIFEROL (VITAMIN D) 1000 UNITS TABLET    Take 2,000 Units by mouth daily.    CLONIDINE (CATAPRES) 0.1 MG TABLET    Take 0.1 mg by mouth. Take one daily as needed for blood pressure > 180/100   DOCUSATE (COLACE) 50 MG/5ML LIQUID    3-5 drops in each ear weekly Sat/Sun at bedtime and flush afterwards   DOXAZOSIN (CARDURA) 4 MG TABLET    Take 1 tablet by mouth daily.   FERROUS SULFATE 325 (65 FE) MG TABLET    Take 1 tablet by mouth every day   FINASTERIDE (PROSCAR) 5 MG TABLET    Take 1 tablet by mouth daily.   FUROSEMIDE (LASIX) 20 MG TABLET    Take by mouth. Reported on 03/27/2015   LEVOTHYROXINE (SYNTHROID, LEVOTHROID) 50 MCG TABLET    Take 50 mcg by mouth every other day.   LOSARTAN (COZAAR) 25 MG TABLET    Take 1 tablet by mouth daily.   MECLIZINE (ANTIVERT) 25 MG TABLET    Take 25 mg by mouth 3 (three) times daily as needed.     MULTIPLE VITAMINS-MINERALS (I-VITE PO)    Take by mouth. One tablet daily for eyes   NUTRITIONAL SUPPLEMENTS (ENSURE PO)    Take by mouth 3 (three) times daily.     OMEPRAZOLE (PRILOSEC) 40 MG CAPSULE    Take 40 mg by mouth daily.   OXYBUTYNIN (DITROPAN-XL) 10 MG 24 HR TABLET    Take 1 tablet by mouth daily.   OXYCODONE (OXYCODONE) 10 MG 12 HR TABLET    Take one tablet by mouth every morning for pain. Do not crush or chew.   OXYCODONE (ROXICODONE) 5 MG IMMEDIATE RELEASE TABLET    Take one tablet by mouth twice daily as needed for breakthrough pain   POLYETHYLENE GLYCOL POWDER (GLYCOLAX/MIRALAX) POWDER    Take 127.5 g by mouth daily. 17g once a day in liquid for constipation.   POLYVINYL ALCOHOL (ARTIFICIAL TEARS) 1.4 % OPHTHALMIC  SOLUTION    1 drop. In both eyes twice daily   SENNA-DOCUSATE (SENEXON-S) 8.6-50 MG PER TABLET    Take 1 tablet by mouth every other day.  Modified Medications   No medications on file  Discontinued Medications   No medications on file     Review of Systems  Constitutional: Negative for fever.       Elderly. Frail.  HENT: Positive for hearing loss.        Xerostomia  Eyes: Negative.  Negative for discharge and redness.  Respiratory: Negative for cough, chest tightness, shortness of breath and wheezing.        Sternal area discomfort resolved  Cardiovascular: Positive for leg swelling. Negative for chest pain and palpitations.       Bradycardia at 60 bpm.  Gastrointestinal: Negative for nausea, vomiting, abdominal pain, diarrhea, constipation, blood in stool, abdominal distention and anal bleeding.  Endocrine: Negative.   Genitourinary: Positive for frequency.  Musculoskeletal: Positive for back pain,  arthralgias and gait problem. Negative for myalgias, joint swelling, neck pain and neck stiffness.  Skin: Negative.  Negative for rash.  Allergic/Immunologic: Negative.   Neurological: Negative.  Negative for weakness.       On and off tinglings in the left fingers-can be relieved by touching his forehead with left fingers.   Psychiatric/Behavioral: Negative for hallucinations, behavioral problems, confusion, sleep disturbance and agitation. The patient is not nervous/anxious and is not hyperactive.     Filed Vitals:   03/27/15 0943  BP: 110/60  Pulse: 82  Temp: 97.2 F (36.2 C)  TempSrc: Oral  Resp: 18  Height: '5\' 10"'$  (1.778 m)  SpO2: 97%   Wt Readings from Last 3 Encounters:  09/26/14 156 lb (70.761 kg)  02/07/14 160 lb (72.576 kg)  09/06/13 165 lb (74.844 kg)    There is no weight on file to calculate BMI.  Physical Exam  Constitutional: He is oriented to person, place, and time. He appears well-developed and well-nourished. No distress.  HENT:  Right Ear: External ear normal.  Left Ear: External ear normal.  Nose: Nose normal.  Mouth/Throat: Oropharynx is clear and moist. No oropharyngeal exudate.  Severe bilateral hearing loss  Eyes: Conjunctivae and EOM are normal. Pupils are equal, round, and reactive to light.  Neck: No JVD present. No tracheal deviation present. No thyromegaly present.  Cardiovascular: Regular rhythm and intact distal pulses.  Exam reveals no gallop and no friction rub.   No murmur heard. Bradycardia at 60 bpm. 3/6 systolic ejection murmur at aortic area.  Pulmonary/Chest: No respiratory distress. He has no wheezes. He has no rales. He exhibits no tenderness.  Abdominal: He exhibits no distension and no mass. There is no tenderness.  Musculoskeletal: He exhibits no edema or tenderness.  Tender in the low back with percussion. Unstable when standing. Rides in a wheelchair.   Lymphadenopathy:    He has no cervical adenopathy.  Neurological: He is  alert and oriented to person, place, and time. He has normal reflexes. No cranial nerve deficit. Coordination normal.  Skin: No rash noted. No erythema. No pallor.  Psychiatric: He has a normal mood and affect. His behavior is normal. Judgment and thought content normal.     Labs reviewed: Lab Summary Latest Ref Rng 12/11/2014 11/09/2014 09/20/2014 09/19/2014 07/27/2014  Hemoglobin 13.5 - 17.5 g/dL 10.2(A) 9.7(A) 9.5(A) 9.5(A) 10.6(A)  Hematocrit 41 - 53 % 30(A) 29(A) (None) 29(A) 32(A)  White count - 6.0 5.7 6.3 3.3 5.8  Platelet count 150 -  399 K/L 157 153 (None) 167 153  Sodium 137 - 147 mmol/L (None) (None) 133(A) (None) 138  Potassium 3.4 - 5.3 mmol/L (None) (None) 5.2 (None) 4.8  Calcium - (None) (None) (None) (None) (None)  Phosphorus - (None) (None) (None) (None) (None)  Creatinine 0.6 - 1.3 mg/dL (None) (None) 1.6(A) (None) 1.3  AST 14 - 40 U/L (None) (None) 15 (None) 18  Alk Phos 25 - 125 U/L (None) (None) 94 (None) 86  Bilirubin - (None) (None) (None) (None) (None)  Glucose - (None) (None) 94 (None) 115  Cholesterol - (None) (None) (None) (None) (None)  HDL cholesterol - (None) (None) (None) (None) (None)  Triglycerides - (None) (None) (None) (None) (None)  LDL Direct - (None) (None) (None) (None) (None)  LDL Calc - (None) (None) (None) (None) (None)  Total protein - (None) (None) (None) (None) (None)  Albumin - (None) (None) (None) (None) (None)   Lab Results  Component Value Date   TSH 2.58 09/19/2014   Lab Results  Component Value Date   BUN 33* 09/20/2014   BUN 30* 07/27/2014   BUN 36* 02/03/2014   Lab Results  Component Value Date   CREATININE 1.6* 09/20/2014   CREATININE 1.3 07/27/2014   CREATININE 1.4* 02/03/2014   No results found for: HGBA1C     Assessment/Plan  Xerostomia -multiple meds stopped in order to improve this issue: clonidine, atorvastatin, colace, doxazosin, furosemide, ferrous sulfate, oxybutynin  Essential hypertension Bp is low  enough to allow reduction in medications  Dyslipidemia -lipids, future  Hypothyroidism, unspecified hypothyroidism type -TSH, future  Iron deficiency anemia due to chronic blood loss -CBC, future  Edema, unspecified type -stopped furosemide. Marland Kitchen

## 2015-04-09 ENCOUNTER — Other Ambulatory Visit: Payer: Self-pay | Admitting: *Deleted

## 2015-04-09 MED ORDER — OXYCODONE HCL ER 10 MG PO T12A: EXTENDED_RELEASE_TABLET | ORAL | Status: DC

## 2015-04-09 NOTE — Telephone Encounter (Signed)
[facility] 

## 2015-04-10 ENCOUNTER — Encounter: Payer: Self-pay | Admitting: Internal Medicine

## 2015-04-10 ENCOUNTER — Non-Acute Institutional Stay: Payer: Medicare PPO | Admitting: Internal Medicine

## 2015-04-10 VITALS — BP 110/64 | HR 58 | Temp 97.3°F | Resp 20 | Wt 154.0 lb

## 2015-04-10 DIAGNOSIS — E785 Hyperlipidemia, unspecified: Secondary | ICD-10-CM

## 2015-04-10 DIAGNOSIS — N183 Chronic kidney disease, stage 3 unspecified: Secondary | ICD-10-CM

## 2015-04-10 DIAGNOSIS — E039 Hypothyroidism, unspecified: Secondary | ICD-10-CM | POA: Diagnosis not present

## 2015-04-10 DIAGNOSIS — I10 Essential (primary) hypertension: Secondary | ICD-10-CM

## 2015-04-10 DIAGNOSIS — N400 Enlarged prostate without lower urinary tract symptoms: Secondary | ICD-10-CM | POA: Diagnosis not present

## 2015-04-10 DIAGNOSIS — J398 Other specified diseases of upper respiratory tract: Secondary | ICD-10-CM | POA: Insufficient documentation

## 2015-04-10 DIAGNOSIS — R609 Edema, unspecified: Secondary | ICD-10-CM | POA: Diagnosis not present

## 2015-04-10 DIAGNOSIS — D509 Iron deficiency anemia, unspecified: Secondary | ICD-10-CM

## 2015-04-10 NOTE — Progress Notes (Signed)
Patient ID: Ricardo [name], male   DOB: 1913-09-11, 82 y.o.   MRN: [id number]    [place] Room Number: [facility] 32  Place of Service: SNF (31)     Allergies  Allergen Reactions  . Indocin [Indomethacin]     Chief Complaint  Patient presents with  . Medical Management of Chronic Issues    mucus attack on Sunday, mostly clear.    HPI:  Patient was seen 2 weeks ago and several medications were discontinued. After a few days to protect the nurse about resuming his oxybutynin due to an increase in frequency. He seems to be tolerating the reduction in medications in regards to all the other medications: Atorvastatin, clonidine, doxazosin, ferrous sulfate, and furosemide.  On 04/08/2015 he had a "attack" of mucus. He wound up coughing quite a bit clear mucoid secretions were brought up repetitively. He says this is cleared up now and he no longer has the problem. He has had similar episodes in the past. He denies aspiration of any food or liquid.   Medications: Patient's Medications  New Prescriptions   No medications on file  Previous Medications   ACETAMINOPHEN PO    Take by mouth. '325mg'$  take 2 every 6 hours as needed   ASPIRIN 81 MG TABLET    Take 81 mg by mouth daily.     BENZOCAINE (ORAJEL) 10 % MUCOSAL GEL    Use as directed 1 application in the mouth or throat as needed for mouth pain.   CHOLECALCIFEROL (VITAMIN D) 1000 UNITS TABLET    Take 2,000 Units by mouth daily.    FINASTERIDE (PROSCAR) 5 MG TABLET    Take 1 tablet by mouth daily.   LEVOTHYROXINE (SYNTHROID, LEVOTHROID) 50 MCG TABLET    Take 50 mcg by mouth every other day.   LOSARTAN (COZAAR) 25 MG TABLET    Take 1 tablet by mouth daily.   MECLIZINE (ANTIVERT) 25 MG TABLET    Take 25 mg by mouth 3 (three) times daily as needed.     MULTIPLE VITAMINS-MINERALS (I-VITE PO)    Take by mouth. One tablet daily for eyes   NUTRITIONAL SUPPLEMENTS (ENSURE PO)    Take by mouth 3 (three) times daily.       OMEPRAZOLE (PRILOSEC) 40 MG CAPSULE    Take 40 mg by mouth daily.   OXYBUTYNIN (DITROPAN) 5 MG TABLET    Take 5 mg by mouth 2 (two) times daily.   OXYCODONE (OXYCONTIN) 10 MG 12 HR TABLET    Take one tablet by mouth every morning for pain. Do not crush or chew.   OXYCODONE (ROXICODONE) 5 MG IMMEDIATE RELEASE TABLET    Take one tablet by mouth twice daily as needed for breakthrough pain   POLYETHYLENE GLYCOL POWDER (GLYCOLAX/MIRALAX) POWDER    Take 127.5 g by mouth daily. 17g once a day in liquid for constipation.   POLYVINYL ALCOHOL (ARTIFICIAL TEARS) 1.4 % OPHTHALMIC SOLUTION    1 drop. In both eyes twice daily   SENNA-DOCUSATE (SENEXON-S) 8.6-50 MG PER TABLET    Take 1 tablet by mouth every other day.  Modified Medications   No medications on file  Discontinued Medications   DOCUSATE (COLACE) 50 MG/5ML LIQUID    3-5 drops in each ear weekly Sat/Sun at bedtime and flush afterwards     Review of Systems  Constitutional: Negative for fever.       Elderly. Frail.  HENT: Positive for hearing loss.  Xerostomia  Eyes: Negative.  Negative for discharge and redness.  Respiratory: Negative for cough, chest tightness, shortness of breath and wheezing.        Sternal area discomfort resolved  Cardiovascular: Positive for leg swelling. Negative for chest pain and palpitations.       Bradycardia at 60 bpm.  Gastrointestinal: Negative for nausea, vomiting, abdominal pain, diarrhea, constipation, blood in stool, abdominal distention and anal bleeding.  Endocrine: Negative.   Genitourinary: Positive for frequency.  Musculoskeletal: Positive for back pain, arthralgias and gait problem. Negative for myalgias, joint swelling, neck pain and neck stiffness.  Skin: Negative.  Negative for rash.  Allergic/Immunologic: Negative.   Neurological: Negative.  Negative for weakness.       On and off tinglings in the left fingers-can be relieved by touching his forehead with left fingers.    Psychiatric/Behavioral: Negative for hallucinations, behavioral problems, confusion, sleep disturbance and agitation. The patient is not nervous/anxious and is not hyperactive.     Filed Vitals:   04/10/15 0947  BP: 110/64  Pulse: 58  Temp: 97.3 F (36.3 C)  TempSrc: Oral  Resp: 20  Weight: 154 lb (69.854 kg)  SpO2: 95%   Wt Readings from Last 3 Encounters:  04/10/15 154 lb (69.854 kg)  09/26/14 156 lb (70.761 kg)  02/07/14 160 lb (72.576 kg)    Body mass index is 22.1 kg/(m^2).  Physical Exam  Constitutional: He is oriented to person, place, and time. He appears well-developed and well-nourished. No distress.  HENT:  Right Ear: External ear normal.  Left Ear: External ear normal.  Nose: Nose normal.  Mouth/Throat: Oropharynx is clear and moist. No oropharyngeal exudate.  Severe bilateral hearing loss  Eyes: Conjunctivae and EOM are normal. Pupils are equal, round, and reactive to light.  Neck: No JVD present. No tracheal deviation present. No thyromegaly present.  Cardiovascular: Regular rhythm and intact distal pulses.  Exam reveals no gallop and no friction rub.   No murmur heard. Bradycardia at 60 bpm. 3/6 systolic ejection murmur at aortic area.  Pulmonary/Chest: No respiratory distress. He has no wheezes. He has no rales. He exhibits no tenderness.  Abdominal: He exhibits no distension and no mass. There is no tenderness.  Musculoskeletal: He exhibits no edema or tenderness.  Tender in the low back with percussion. Unstable when standing. Rides in a wheelchair.   Lymphadenopathy:    He has no cervical adenopathy.  Neurological: He is alert and oriented to person, place, and time. He has normal reflexes. No cranial nerve deficit. Coordination normal.  Skin: No rash noted. No erythema. No pallor.  Psychiatric: He has a normal mood and affect. His behavior is normal. Judgment and thought content normal.     Labs reviewed: Lab Summary Latest Ref Rng 12/11/2014  11/09/2014 09/20/2014 09/19/2014 07/27/2014  Hemoglobin 13.5 - 17.5 g/dL 10.2(A) 9.7(A) 9.5(A) 9.5(A) 10.6(A)  Hematocrit 41 - 53 % 30(A) 29(A) (None) 29(A) 32(A)  White count - 6.0 5.7 6.3 3.3 5.8  Platelet count 150 - 399 K/L 157 153 (None) 167 153  Sodium 137 - 147 mmol/L (None) (None) 133(A) (None) 138  Potassium 3.4 - 5.3 mmol/L (None) (None) 5.2 (None) 4.8  Calcium - (None) (None) (None) (None) (None)  Phosphorus - (None) (None) (None) (None) (None)  Creatinine 0.6 - 1.3 mg/dL (None) (None) 1.6(A) (None) 1.3  AST 14 - 40 U/L (None) (None) 15 (None) 18  Alk Phos 25 - 125 U/L (None) (None) 94 (None) 86  Bilirubin - (None) (  None) (None) (None) (None)  Glucose - (None) (None) 94 (None) 115  Cholesterol - (None) (None) (None) (None) (None)  HDL cholesterol - (None) (None) (None) (None) (None)  Triglycerides - (None) (None) (None) (None) (None)  LDL Direct - (None) (None) (None) (None) (None)  LDL Calc - (None) (None) (None) (None) (None)  Total protein - (None) (None) (None) (None) (None)  Albumin - (None) (None) (None) (None) (None)   Lab Results  Component Value Date   TSH 2.58 09/19/2014   Lab Results  Component Value Date   BUN 33* 09/20/2014   BUN 30* 07/27/2014   BUN 36* 02/03/2014   Lab Results  Component Value Date   CREATININE 1.6* 09/20/2014   CREATININE 1.3 07/27/2014   CREATININE 1.4* 02/03/2014   No results found for: HGBA1C     Assessment/Plan 1. Chronic mucus hypersecretion, respiratory Robitussin-DM 10 mL every 4 hours when necessary cough or congestion  2. Iron deficiency anemia -CBC  3. Hypothyroidism, unspecified hypothyroidism type TSH-  4. Essential hypertension -CMP  5. Edema, unspecified type None present  6. Chronic kidney disease, stage III (moderate) -CMP  7. Dyslipidemia -lipids  8. BPH (benign prostatic hyperplasia) Resumed oxybutynin at patient request lst week

## 2015-04-12 DIAGNOSIS — E1059 Type 1 diabetes mellitus with other circulatory complications: Secondary | ICD-10-CM | POA: Diagnosis not present

## 2015-04-12 DIAGNOSIS — I5021 Acute systolic (congestive) heart failure: Secondary | ICD-10-CM | POA: Diagnosis not present

## 2015-04-12 DIAGNOSIS — E079 Disorder of thyroid, unspecified: Secondary | ICD-10-CM | POA: Diagnosis not present

## 2015-04-24 ENCOUNTER — Encounter: Payer: Self-pay | Admitting: Nurse Practitioner

## 2015-04-24 ENCOUNTER — Non-Acute Institutional Stay: Payer: Medicare PPO | Admitting: Nurse Practitioner

## 2015-04-24 DIAGNOSIS — M4802 Spinal stenosis, cervical region: Secondary | ICD-10-CM | POA: Diagnosis not present

## 2015-04-24 DIAGNOSIS — D509 Iron deficiency anemia, unspecified: Secondary | ICD-10-CM | POA: Diagnosis not present

## 2015-04-24 DIAGNOSIS — R609 Edema, unspecified: Secondary | ICD-10-CM

## 2015-04-24 DIAGNOSIS — K219 Gastro-esophageal reflux disease without esophagitis: Secondary | ICD-10-CM

## 2015-04-24 DIAGNOSIS — I10 Essential (primary) hypertension: Secondary | ICD-10-CM | POA: Diagnosis not present

## 2015-04-24 DIAGNOSIS — K5900 Constipation, unspecified: Secondary | ICD-10-CM | POA: Diagnosis not present

## 2015-04-24 DIAGNOSIS — E032 Hypothyroidism due to medicaments and other exogenous substances: Secondary | ICD-10-CM

## 2015-04-24 DIAGNOSIS — N400 Enlarged prostate without lower urinary tract symptoms: Secondary | ICD-10-CM | POA: Diagnosis not present

## 2015-04-24 DIAGNOSIS — N183 Chronic kidney disease, stage 3 unspecified: Secondary | ICD-10-CM

## 2015-04-24 DIAGNOSIS — F418 Other specified anxiety disorders: Secondary | ICD-10-CM | POA: Diagnosis not present

## 2015-04-24 NOTE — Progress Notes (Signed)
Patient ID: AARAN ENBERG, male   DOB: 06-Mar-1914, 82 y.o.   MRN: [id number]  Location:  [facility] Provider:  Chipper Oman NP  Code Status:  DNR Goals of care: Advanced Directive information Does patient have an advance directive?: Yes, Type of Advance Directive: Healthcare Power of Attorney  Chief Complaint  Patient presents with  . Medical Management of Chronic Issues    Routine Visit     HPI: Patient is a 82 y.o. male seen in the AL at [facility] today for evaluation of  HTN, urinary frequency, depression, memory deficit.   Review of Systems:  Review of Systems  Constitutional: Negative for fever.       Elderly. Frail.  HENT: Positive for hearing loss.        Xerostomia  Eyes: Negative.  Negative for discharge and redness.  Respiratory: Positive for cough. Negative for shortness of breath and wheezing.        Sternal area discomfort resolved  Cardiovascular: Positive for leg swelling. Negative for chest pain and palpitations.       Bradycardia at 60 bpm.  Gastrointestinal: Negative for nausea, vomiting, abdominal pain, diarrhea, constipation and blood in stool.  Genitourinary: Positive for frequency.  Musculoskeletal: Positive for back pain. Negative for myalgias and neck pain.  Skin: Negative.  Negative for rash.  Neurological: Negative.  Negative for weakness.       On and off tinglings in the left fingers-can be relieved by touching his forehead with left fingers.   Psychiatric/Behavioral: Negative for hallucinations. The patient is not nervous/anxious.     Past Medical History  Diagnosis Date  . Hypertension   . Hyperlipidemia   . Vertigo   . Hypothyroidism   . Fall   . GERD (gastroesophageal reflux disease)   . Anemia   . Hyponatremia   . Hypercalcemia   . AAA (abdominal aortic aneurysm) (HCC)   . Renal failure, acute (HCC)   . Infectious colitis   . Fecal impaction (HCC)   . Hiatal hernia   . Arthritis   . Joint pain   . Ulcer     Stomach  . Leg  pain     with walking  . Swelling, mass, or lump in head and neck 01/03/2011  . Other specified cardiac dysrhythmias(427.89) 12/24/2010  . Lumbago 12/24/2010  . Orthostatic hypotension 11/05/2010  . Urinary frequency 09/13/2010  . Unspecified vitamin D deficiency 08/06/2010  . Pain in joint, pelvic region and thigh 08/06/2010  . Anxiety state, unspecified 08/01/2010  . Unspecified hearing loss 08/01/2010  . Congestive heart failure, unspecified 08/01/2010  . Peptic ulcer, unspecified site, unspecified as acute or chronic, without mention of hemorrhage, perforation, or obstruction 08/01/2010  . Unspecified disorder of kidney and ureter 08/01/2010  . Hypertrophy of prostate without urinary obstruction and other lower urinary tract symptoms (LUTS) 08/01/2010  . Adhesive capsulitis of shoulder 08/01/2010    "Frozen" right shoulder  . Disorders of bursae and tendons in shoulder region, unspecified 08/01/2010  . Insomnia, unspecified 08/01/2010  . Abnormality of gait 08/01/2010  . Edema 08/01/2010    Patient Active Problem List   Diagnosis Date Noted  . Chronic mucus hypersecretion, respiratory 04/10/2015  . Xerostomia 03/27/2015  . Edema 03/27/2015  . Bradycardia by electrocardiogram 09/22/2014  . Spinal stenosis in cervical region 09/06/2013  . Deaf 06/21/2013  . Lumbago 06/21/2013  . Constipation 03/08/2013  . Hypothyroidism 03/08/2013  . BPH (benign prostatic hyperplasia) 03/08/2013  . Depression with anxiety 03/08/2013  .  Fall at nursing home 03/08/2013  . AAA (abdominal aortic aneurysm) (HCC) 01/27/2012  . Abnormality of gait 08/01/2010  . Unspecified hearing loss 08/01/2010  . OSTEOARTHROS UNSPEC WHETHER GEN/LOC UNSPEC SITE 05/17/2009  . GERD 03/16/2009  . ULCER-GASTRIC 03/16/2009  . DIVERTICULOSIS-COLON 03/16/2009  . Dyslipidemia 01/30/2009  . Iron deficiency anemia 01/30/2009  . Essential hypertension 01/30/2009  . VERTIGO 01/30/2009  . Chronic kidney disease, stage III (moderate)  01/30/2009    Allergies  Allergen Reactions  . Indocin [Indomethacin]     Medications: Patient's Medications  New Prescriptions   No medications on file  Previous Medications   ACETAMINOPHEN PO    Take by mouth. take 2 every 6 hours as needed   CARBAMIDE PEROXIDE (DEBROX) 6.5 % OTIC SOLUTION    Instill 3-5 Drops into each ear once a week on Saturday and on Sunday at bedtime flush   CHOLECALCIFEROL (VITAMIN D) 1000 UNITS TABLET    Take 2,000 Units by mouth daily.    FINASTERIDE (PROSCAR) 5 MG TABLET    Take 1 tablet by mouth daily.   GUAIFENESIN (ROBITUSSIN) 100 MG/5ML LIQUID    Take 10 CC by mouth every 4 hours as needed for cough or excessive mucus   LEVOTHYROXINE (SYNTHROID, LEVOTHROID) 50 MCG TABLET    Take 50 mcg by mouth every other day.   LOSARTAN (COZAAR) 25 MG TABLET    Take 1 tablet by mouth daily.   MECLIZINE (ANTIVERT) 25 MG TABLET    Take 25 mg by mouth 2 (two) times daily.    OMEPRAZOLE (PRILOSEC) 20 MG CAPSULE    Take 20 mg by mouth daily.   OXYBUTYNIN (DITROPAN) 5 MG TABLET    Take 5 mg by mouth 2 (two) times daily.   OXYCODONE (OXYCONTIN) 10 MG 12 HR TABLET    Take one tablet by mouth every morning for pain. Do not crush or chew.   OXYCODONE (ROXICODONE) 5 MG IMMEDIATE RELEASE TABLET    Take one tablet by mouth twice daily as needed for breakthrough pain   POLYETHYLENE GLYCOL POWDER (GLYCOLAX/MIRALAX) POWDER    Take 127.5 g by mouth daily. 17g once a day in liquid for constipation.   POLYVINYL ALCOHOL (ARTIFICIAL TEARS) 1.4 % OPHTHALMIC SOLUTION    1 drop. In both eyes twice daily   SENNA-DOCUSATE (SENEXON-S) 8.6-50 MG PER TABLET    Take 1 tablet by mouth every other day.  Modified Medications   No medications on file  Discontinued Medications   ASPIRIN 81 MG TABLET    Take 81 mg by mouth daily.     BENZOCAINE (ORAJEL) 10 % MUCOSAL GEL    Use as directed 1 application in the mouth or throat as needed for mouth pain.   MULTIPLE VITAMINS-MINERALS (I-VITE PO)    Take by  mouth. One tablet daily for eyes   NUTRITIONAL SUPPLEMENTS (ENSURE PO)    Take by mouth 3 (three) times daily.     OMEPRAZOLE (PRILOSEC) 40 MG CAPSULE    Take 40 mg by mouth daily.    Physical Exam: Filed Vitals:   04/24/15 1106  BP: 115/75  Pulse: 73  Temp: 97.6 F (36.4 C)  TempSrc: Oral  Resp: 16  Height: 5\' 10"  (1.778 m)  Weight: 156 lb (70.761 kg)   Body mass index is 22.38 kg/(m^2).  Physical Exam  Constitutional: He is oriented to person, place, and time. He appears well-developed and well-nourished. No distress.  HENT:  Right Ear: External ear normal.  Left Ear:  External ear normal.  Nose: Nose normal.  Mouth/Throat: Oropharynx is clear and moist. No oropharyngeal exudate.  Severe bilateral hearing loss  Eyes: Conjunctivae and EOM are normal. Pupils are equal, round, and reactive to light.  Neck: No JVD present. No tracheal deviation present. No thyromegaly present.  Cardiovascular: Regular rhythm and intact distal pulses.  Exam reveals no gallop and no friction rub.   No murmur heard. Bradycardia at 60 bpm. 3/6 systolic ejection murmur at aortic area.  Pulmonary/Chest: No respiratory distress. He has no wheezes. He has no rales. He exhibits no tenderness.  Abdominal: He exhibits no distension and no mass. There is no tenderness.  Musculoskeletal: He exhibits no edema or tenderness.  Tender in the low back with percussion. Unstable when standing. Rides in a wheelchair.   Lymphadenopathy:    He has no cervical adenopathy.  Neurological: He is alert and oriented to person, place, and time. He has normal reflexes. No cranial nerve deficit. Coordination normal.  Skin: No rash noted. No erythema. No pallor.  Psychiatric: He has a normal mood and affect. His behavior is normal. Judgment and thought content normal.    Labs reviewed: Basic Metabolic Panel:  Recent Labs  16/10/96 09/20/14  NA 138 133*  K 4.8 5.2  BUN 30* 33*  CREATININE 1.3 1.6*    Liver Function  Tests:  Recent Labs  07/27/14 09/20/14  AST 18 15  ALT 11 8*  ALKPHOS 86 94    CBC:  Recent Labs  09/19/14 09/20/14 11/09/14 12/11/14  WBC 3.3 6.3 5.7 6.0  HGB 9.5* 9.5* 9.7* 10.2*  HCT 29*  --  29* 30*  PLT 167  --  153 157    Lab Results  Component Value Date   TSH 2.58 09/19/2014   No results found for: HGBA1C No results found for: CHOL, HDL, LDLCALC, LDLDIRECT, TRIG, CHOLHDL  Significant Diagnostic Results since last visit: none  Patient Care Team: Kimber Relic, MD as PCP - General (Internal Medicine)  Assessment/Plan Problem List Items Addressed This Visit    Iron deficiency anemia - Primary (Chronic)    712/16 Hgb 9.5,  continue Fe 325mg  daily 09/22/14 improved epigastric discomfort after Carafate added, continued PPI, dc ASA given hx of gastric ulcer, recent epigastric discomfort, and dropping in Hgb R>B 12/11/14 Iron 37, Hgb 10.2 Repeat CBC      Essential hypertension (Chronic)    Controlled, 09/19/14 CXR no acute cardiopulmonary pathology appreciated. Takes Losartan 62m mg, Bun/creat trending up slowly, dc Furosemide Update CMP      Chronic kidney disease, stage III (moderate) (Chronic)    Update CMP7/12/16 Na 133, K 5.2, Bun 33, creatinine 1.63. 09/22/14 dc Furosemide       Hypothyroidism (Chronic)    Takes Levothyroxine , 09/19/14 TSH 2.578, update TSH      BPH (benign prostatic hyperplasia) (Chronic)    No urinary retention, takes Oxybutynin 10mg  daily, Finasteride 5mg  daily, Doxazosin 4mg  daily       Edema (Chronic)    Trace in ankles, off diuretics      GERD    takes Pantoprazole 40mg  daily, resolved cough and sternal area discomfort      Relevant Medications   omeprazole (PRILOSEC) 20 MG capsule   Constipation    Stable on Senokot S I qod and MiraLax daily.       Depression with anxiety    Stable on Sertraline 50mg  daily.       Spinal stenosis in cervical region  Chronic, w/c for mobility, Oxycontin  qam           Family/ staff Communication: continue AL for care needs.   Labs/tests ordered: TSH CBC CMP   ManXie Dhani Imel NP Geriatrics [facility] [address], [place] [address] On Call:  [phone] & follow prompts after 5pm & weekends Office Phone:  [phone] Office Fax:  [phone]

## 2015-04-24 NOTE — Assessment & Plan Note (Signed)
Controlled, 09/19/14 CXR no acute cardiopulmonary pathology appreciated. Takes Losartan 74m mg, Bun/creat trending up slowly, dc Furosemide Update CMP

## 2015-04-24 NOTE — Assessment & Plan Note (Addendum)
Update CMP7/12/16 Na 133, K 5.2, Bun 33, creatinine 1.63. 09/22/14 dc Furosemide

## 2015-04-24 NOTE — Assessment & Plan Note (Signed)
No urinary retention, takes Oxybutynin  daily, Finasteride  daily, Doxazosin  daily

## 2015-04-24 NOTE — Assessment & Plan Note (Addendum)
712/16 Hgb 9.5,  continue Fe  daily 09/22/14 improved epigastric discomfort after Carafate added, continued PPI, dc ASA given hx of gastric ulcer, recent epigastric discomfort, and dropping in Hgb R>B 12/11/14 Iron 37, Hgb 10.2 Repeat CBC

## 2015-04-24 NOTE — Assessment & Plan Note (Signed)
takes Pantoprazole  daily, resolved cough and sternal area discomfort

## 2015-04-24 NOTE — Assessment & Plan Note (Signed)
Stable on Senokot S I qod and MiraLax daily.   

## 2015-04-24 NOTE — Assessment & Plan Note (Signed)
Chronic, w/c for mobility, Oxycontin 10mg qam  

## 2015-04-24 NOTE — Assessment & Plan Note (Signed)
Takes Levothyroxine , 09/19/14 TSH 2.578, update TSH

## 2015-04-24 NOTE — Assessment & Plan Note (Signed)
Stable on Sertraline 50mg daily.  

## 2015-04-24 NOTE — Progress Notes (Signed)
Patient ID: Ricardo [name], male   DOB: [birth year], 82 y.o.   MRN: [id number]

## 2015-04-24 NOTE — Assessment & Plan Note (Signed)
Trace in ankles, off diuretics  

## 2015-04-26 DIAGNOSIS — E039 Hypothyroidism, unspecified: Secondary | ICD-10-CM | POA: Diagnosis not present

## 2015-04-26 LAB — HEPATIC FUNCTION PANEL
ALT: 8 U/L — AB (ref 10–40)
AST: 18 U/L (ref 14–40)
Alkaline Phosphatase: 107 U/L (ref 25–125)
BILIRUBIN, TOTAL: 0.4 mg/dL

## 2015-04-26 LAB — BASIC METABOLIC PANEL
BUN: 34 mg/dL — AB (ref 4–21)
Creatinine: 1.4 mg/dL — AB (ref 0.6–1.3)
GLUCOSE: 91 mg/dL
POTASSIUM: 4.6 mmol/L (ref 3.4–5.3)
SODIUM: 137 mmol/L (ref 137–147)

## 2015-04-26 LAB — CBC AND DIFFERENTIAL
HCT: 37 % — AB (ref 41–53)
Hemoglobin: 12.1 g/dL — AB (ref 13.5–17.5)
Platelets: 174 10*3/uL (ref 150–399)
WBC: 7.3 10^3/mL

## 2015-04-26 LAB — TSH: TSH: 3.1 u[IU]/mL (ref 0.41–5.90)

## 2015-05-10 ENCOUNTER — Other Ambulatory Visit: Payer: Self-pay

## 2015-05-10 MED ORDER — OXYCODONE HCL ER 10 MG PO T12A: EXTENDED_RELEASE_TABLET | ORAL | Status: DC

## 2015-05-10 NOTE — Telephone Encounter (Signed)
Refill request for Oxycontin 10 mg ER tablets.   Prescription printed and placed in Dr. Ernest Mallick folder for signing.

## 2015-06-05 ENCOUNTER — Non-Acute Institutional Stay: Payer: Medicare PPO | Admitting: Nurse Practitioner

## 2015-06-05 ENCOUNTER — Encounter: Payer: Self-pay | Admitting: Nurse Practitioner

## 2015-06-05 DIAGNOSIS — K5900 Constipation, unspecified: Secondary | ICD-10-CM | POA: Diagnosis not present

## 2015-06-05 DIAGNOSIS — E039 Hypothyroidism, unspecified: Secondary | ICD-10-CM

## 2015-06-05 DIAGNOSIS — N400 Enlarged prostate without lower urinary tract symptoms: Secondary | ICD-10-CM

## 2015-06-05 DIAGNOSIS — R42 Dizziness and giddiness: Secondary | ICD-10-CM | POA: Diagnosis not present

## 2015-06-05 DIAGNOSIS — M5440 Lumbago with sciatica, unspecified side: Secondary | ICD-10-CM | POA: Diagnosis not present

## 2015-06-05 DIAGNOSIS — I10 Essential (primary) hypertension: Secondary | ICD-10-CM

## 2015-06-05 DIAGNOSIS — K219 Gastro-esophageal reflux disease without esophagitis: Secondary | ICD-10-CM

## 2015-06-05 DIAGNOSIS — R609 Edema, unspecified: Secondary | ICD-10-CM

## 2015-06-07 NOTE — Assessment & Plan Note (Signed)
Takes Levothyroxine 50mcg, 09/19/14 TSH 2.578, 04/26/15 TSH 3.10

## 2015-06-07 NOTE — Progress Notes (Signed)
Patient ID: Ricardo [name], male   DOB: 02-Jan-1914, 82 y.o.   MRN: [id number]  Location:  [facility] Room Number: AL 32 Place of Service: [facility] Provider:  [name] NP  GREEN, Lenon CurtARTHUR G, MD  Patient Care Team: [name], MD as PCP - General (Internal Medicine)  Extended Emergency Contact Information Primary Emergency Contact: [name],[name] Home Phone: [phone] Relation: Son Secondary Emergency Contact: [name],[name]  [place] Home Phone: [phone] Relation: Grandson  Code Status:  DNR Goals of care: Advanced Directive information Advanced Directives 06/05/2015  Does patient have an advance directive? Yes  Type of Estate agentAdvance Directive Healthcare Power of [place];Living will  Does patient want to make changes to advanced directive? No - Patient declined  Copy of advanced directive(s) in chart? Yes     Chief Complaint  Patient presents with  . Medical Management of Chronic Issues    Routine Visit    HPI:  Pt is a 82 y.o. male seen today for medical management of chronic diseases.  HTN, controlled on Losartan 25mg , urinary frequency, managed with Finasteride 5mg , hypothyroidism, taking Levothyroxine 50mcg,  depression, mood is stable, off mood stabilizer, asymptomatic GERD while on Omeprazole 20mg  daily, memory deficit, resides in [facility] for care needs, chronic lower back pain, managed with Tylenol 650mg  q6h prn, Oxycodone 5mg  qam, bid prn, no constipation while on MiraLax daily, Senna I qod, chronic dizziness is managed with Meclizine 25mg  bid.    Past Medical History  Diagnosis Date  . Hypertension   . Hyperlipidemia   . Vertigo   . Hypothyroidism   . Fall   . GERD (gastroesophageal reflux disease)   . Anemia   . Hyponatremia   . Hypercalcemia   . AAA (abdominal aortic aneurysm) (HCC)   . Renal failure, acute (HCC)   . Infectious colitis   . Fecal impaction (HCC)   .  Hiatal hernia   . Arthritis   . Joint pain   . Ulcer     Stomach  . Leg pain     with walking  . Swelling, mass, or lump in head and neck 01/03/2011  . Other specified cardiac dysrhythmias(427.89) 12/24/2010  . Lumbago 12/24/2010  . Orthostatic hypotension 11/05/2010  . Urinary frequency 09/13/2010  . Unspecified vitamin D deficiency 08/06/2010  . Pain in joint, pelvic region and thigh 08/06/2010  . Anxiety state, unspecified 08/01/2010  . Unspecified hearing loss 08/01/2010  . Congestive heart failure, unspecified 08/01/2010  . Peptic ulcer, unspecified site, unspecified as acute or chronic, without mention of hemorrhage, perforation, or obstruction 08/01/2010  . Unspecified disorder of kidney and ureter 08/01/2010  . Hypertrophy of prostate without urinary obstruction and other lower urinary tract symptoms (LUTS) 08/01/2010  . Adhesive capsulitis of shoulder 08/01/2010    "Frozen" right shoulder  . Disorders of bursae and tendons in shoulder region, unspecified 08/01/2010  . Insomnia, unspecified 08/01/2010  . Abnormality of gait 08/01/2010  . Edema 08/01/2010   Past Surgical History  Procedure Laterality Date  . Appendectomy    . Cataract extraction    . Inguinal hernia repair Left 2005  . Transthoracic echocardiogram  07/19/2010     Left ventricle: There is hypokinesis of the inferior wall and  posterior wall. The EF is 35-40%. The cavity size was normal  . Abdominal aortic aneurysm repair  12-18-10    Stent graft repair of AAA  .  Hemorrhoid surgery  2000    Allergies  Allergen Reactions  . Indocin [Indomethacin]       Medication List       This list is accurate as of: 06/05/15 11:59 PM.  Always use your most recent med list.               acetaminophen 325 MG tablet  Commonly known as:  TYLENOL  Take 650 mg by mouth every 6 (six) hours as needed for mild pain, moderate pain or fever.     ARTIFICIAL TEARS 1.4 % ophthalmic solution  Generic drug:  polyvinyl alcohol  1  drop. In both eyes twice daily     carbamide peroxide 6.5 % otic solution  Commonly known as:  DEBROX  Instill 3-5 Drops into each ear once a week on Saturday and on Sunday at bedtime flush     cholecalciferol 1000 units tablet  Commonly known as:  VITAMIN D  Take 2,000 Units by mouth daily.     finasteride 5 MG tablet  Commonly known as:  PROSCAR  Take 1 tablet by mouth daily.     guaiFENesin 100 MG/5ML liquid  Commonly known as:  ROBITUSSIN  Take 10 CC by mouth every 4 hours as needed for cough or excessive mucus     levothyroxine 50 MCG tablet  Commonly known as:  SYNTHROID, LEVOTHROID  Take 50 mcg by mouth every other day.     losartan 25 MG tablet  Commonly known as:  COZAAR  Take 1 tablet by mouth daily.     meclizine 25 MG tablet  Commonly known as:  ANTIVERT  Take 25 mg by mouth 2 (two) times daily.     omeprazole 20 MG capsule  Commonly known as:  PRILOSEC  Take 20 mg by mouth daily.     oxybutynin 5 MG tablet  Commonly known as:  DITROPAN  Take 5 mg by mouth 2 (two) times daily.     oxyCODONE 5 MG immediate release tablet  Commonly known as:  ROXICODONE  Take one tablet by mouth twice daily as needed for breakthrough pain     oxyCODONE 10 mg 12 hr tablet  Commonly known as:  OXYCONTIN  Take one tablet by mouth every morning for pain. Do not crush or chew.     polyethylene glycol powder powder  Commonly known as:  GLYCOLAX/MIRALAX  Take 127.5 g by mouth daily. 17g once a day in liquid for constipation.     SENEXON-S 8.6-50 MG tablet  Generic drug:  senna-docusate  Take 1 tablet by mouth every other day.        Review of Systems  Constitutional: Negative for fever.       Elderly. Frail.  HENT: Positive for hearing loss.        Xerostomia  Eyes: Negative.  Negative for discharge and redness.  Respiratory: Positive for cough. Negative for shortness of breath and wheezing.        Sternal area discomfort resolved  Cardiovascular: Positive for leg  swelling. Negative for chest pain and palpitations.       Bradycardia at 60 bpm.  Gastrointestinal: Negative for nausea, vomiting, abdominal pain, diarrhea, constipation and blood in stool.  Genitourinary: Positive for frequency.  Musculoskeletal: Positive for back pain. Negative for myalgias and neck pain.  Skin: Negative.  Negative for rash.  Neurological: Negative.  Negative for weakness.       On and off tinglings in the left fingers-can be relieved by  touching his forehead with left fingers.   Psychiatric/Behavioral: Negative for hallucinations. The patient is not nervous/anxious.     Immunization History  Administered Date(s) Administered  . Influenza-Unspecified 12/27/2012, 12/13/2013, 12/14/2014  . Pneumococcal Polysaccharide-23 03/10/2005  . Td 03/11/1999   Pertinent  Health Maintenance Due  Topic Date Due  . PNA vac Low Risk Adult (2 of 2 - PCV13) 03/10/2006  . INFLUENZA VACCINE  10/09/2015   Fall Risk  04/10/2015 03/27/2015  Falls in the past year? No No   Functional Status Survey:    Filed Vitals:   06/05/15 1315  BP: 120/82  Pulse: 58  Temp: 97.9 F (36.6 C)  TempSrc: Oral  Resp: 18  Height: 5\' 10"  (1.778 m)  Weight: 150 lb (68.04 kg)   Body mass index is 21.52 kg/(m^2). Physical Exam  Constitutional: He is oriented to person, place, and time. He appears well-developed and well-nourished. No distress.  HENT:  Right Ear: External ear normal.  Left Ear: External ear normal.  Nose: Nose normal.  Mouth/Throat: Oropharynx is clear and moist. No oropharyngeal exudate.  Severe bilateral hearing loss  Eyes: Conjunctivae and EOM are normal. Pupils are equal, round, and reactive to light.  Neck: No JVD present. No tracheal deviation present. No thyromegaly present.  Cardiovascular: Regular rhythm and intact distal pulses.  Exam reveals no gallop and no friction rub.   No murmur heard. Bradycardia at 60 bpm. 3/6 systolic ejection murmur at aortic area.    Pulmonary/Chest: No respiratory distress. He has no wheezes. He has no rales. He exhibits no tenderness.  Abdominal: He exhibits no distension and no mass. There is no tenderness.  Musculoskeletal: He exhibits no edema or tenderness.  Tender in the low back with percussion. Unstable when standing. Rides in a wheelchair.   Lymphadenopathy:    He has no cervical adenopathy.  Neurological: He is alert and oriented to person, place, and time. He has normal reflexes. No cranial nerve deficit. Coordination normal.  Skin: No rash noted. No erythema. No pallor.  Right elbow skin tear from the fall, no s/s of infection  Psychiatric: He has a normal mood and affect. His behavior is normal. Judgment and thought content normal.    Labs reviewed:  Recent Labs  07/27/14 09/20/14 04/26/15  NA 138 133* 137  K 4.8 5.2 4.6  BUN 30* 33* 34*  CREATININE 1.3 1.6* 1.4*    Recent Labs  07/27/14 09/20/14 04/26/15  AST 18 15 18   ALT 11 8* 8*  ALKPHOS 86 94 107    Recent Labs  11/09/14 12/11/14 04/26/15  WBC 5.7 6.0 7.3  HGB 9.7* 10.2* 12.1*  HCT 29* 30* 37*  PLT 153 157 174   Lab Results  Component Value Date   TSH 3.10 04/26/2015   No results found for: HGBA1C No results found for: CHOL, HDL, LDLCALC, LDLDIRECT, TRIG, CHOLHDL  Significant Diagnostic Results in last 30 days:  No results found.  Assessment/Plan  BPH (benign prostatic hyperplasia) No urinary retention, takes Oxybutynin 5mg  bid, Finasteride 5mg  daily  Hypothyroidism Takes Levothyroxine , 09/19/14 TSH 2.578, 04/26/15 TSH 3.10  Essential hypertension Controlled, 09/19/14 CXR no acute cardiopulmonary pathology appreciated. Takes Losartan 5m mg, off Furosemide  Edema Trace in ankles, off diuretics  GERD Stable, continue Omeprazole 20mg  daily, resolved cough and sternal area discomfort  Lumbago Pain is controlled, continue Tylenol 650mg  q6h prn, Oxycodone 5mg  qam and bid prn  Constipation Stable, continue  Senokot S I qod and MiraLax daily.  VERTIGO Stable, continue Meclizine  bid.     Family/ staff Communication: continue [facility] for care needs.   Labs/tests ordered:  none

## 2015-06-07 NOTE — Assessment & Plan Note (Signed)
Stable, continue Meclizine 25mg  bid.

## 2015-06-07 NOTE — Assessment & Plan Note (Signed)
Stable, continue Omeprazole 20mg daily, resolved cough and sternal area discomfort  

## 2015-06-07 NOTE — Assessment & Plan Note (Signed)
No urinary retention, takes Oxybutynin 5mg bid, Finasteride 5mg daily 

## 2015-06-07 NOTE — Assessment & Plan Note (Signed)
Stable, continue Senokot S I qod and MiraLax daily.   

## 2015-06-07 NOTE — Assessment & Plan Note (Signed)
Trace in ankles, off diuretics  

## 2015-06-07 NOTE — Assessment & Plan Note (Signed)
Controlled, 09/19/14 CXR no acute cardiopulmonary pathology appreciated. Takes Losartan 5439m mg, off Furosemide

## 2015-06-07 NOTE — Assessment & Plan Note (Signed)
Pain is controlled, continue Tylenol 650mg  q6h prn, Oxycodone 5mg  qam and bid prn

## 2015-06-08 ENCOUNTER — Other Ambulatory Visit: Payer: Self-pay | Admitting: *Deleted

## 2015-06-08 MED ORDER — OXYCODONE HCL ER 10 MG PO T12A: EXTENDED_RELEASE_TABLET | ORAL | Status: DC

## 2015-06-08 NOTE — Telephone Encounter (Signed)
[facility] fax order request

## 2015-06-25 DIAGNOSIS — E039 Hypothyroidism, unspecified: Secondary | ICD-10-CM | POA: Diagnosis not present

## 2015-06-25 DIAGNOSIS — D500 Iron deficiency anemia secondary to blood loss (chronic): Secondary | ICD-10-CM | POA: Diagnosis not present

## 2015-06-25 DIAGNOSIS — E785 Hyperlipidemia, unspecified: Secondary | ICD-10-CM | POA: Diagnosis not present

## 2015-06-25 DIAGNOSIS — I10 Essential (primary) hypertension: Secondary | ICD-10-CM | POA: Diagnosis not present

## 2015-06-25 LAB — HEPATIC FUNCTION PANEL
ALT: 8 U/L — AB (ref 10–40)
AST: 17 U/L (ref 14–40)
Alkaline Phosphatase: 107 U/L (ref 25–125)
Bilirubin, Total: 0.5 mg/dL

## 2015-06-25 LAB — CBC AND DIFFERENTIAL
HCT: 34 % — AB (ref 41–53)
Hemoglobin: 11.2 g/dL — AB (ref 13.5–17.5)
Platelets: 185 K/µL (ref 150–399)
WBC: 6.2 10*3/mL

## 2015-06-25 LAB — LIPID PANEL
Cholesterol: 169 mg/dL (ref 0–200)
HDL: 51 mg/dL (ref 35–70)
LDL Cholesterol: 102 mg/dL
Triglycerides: 82 mg/dL (ref 40–160)

## 2015-06-25 LAB — BASIC METABOLIC PANEL WITH GFR
BUN: 35 mg/dL — AB (ref 4–21)
Creatinine: 1.4 mg/dL — AB (ref 0.6–1.3)
Glucose: 89 mg/dL
Potassium: 4.8 mmol/L (ref 3.4–5.3)
Sodium: 137 mmol/L (ref 137–147)

## 2015-06-25 LAB — TSH: TSH: 3.7 u[IU]/mL (ref 0.41–5.90)

## 2015-06-28 DIAGNOSIS — H6123 Impacted cerumen, bilateral: Secondary | ICD-10-CM | POA: Diagnosis not present

## 2015-07-09 ENCOUNTER — Other Ambulatory Visit: Payer: Self-pay | Admitting: *Deleted

## 2015-07-09 MED ORDER — OXYCODONE HCL ER 10 MG PO T12A: EXTENDED_RELEASE_TABLET | ORAL | Status: DC

## 2015-07-09 NOTE — Telephone Encounter (Signed)
[facility] 

## 2015-07-23 ENCOUNTER — Other Ambulatory Visit: Payer: Self-pay | Admitting: *Deleted

## 2015-07-23 MED ORDER — OXYCODONE HCL 5 MG PO TABS: ORAL_TABLET | ORAL | Status: DC

## 2015-07-23 NOTE — Telephone Encounter (Signed)
[facility] fax order 

## 2015-08-07 ENCOUNTER — Encounter: Payer: Self-pay | Admitting: Internal Medicine

## 2015-08-07 ENCOUNTER — Non-Acute Institutional Stay: Payer: Medicare PPO | Admitting: Internal Medicine

## 2015-08-07 VITALS — BP 124/70 | HR 75 | Temp 97.7°F | Ht 70.0 in | Wt 151.0 lb

## 2015-08-07 DIAGNOSIS — I10 Essential (primary) hypertension: Secondary | ICD-10-CM | POA: Diagnosis not present

## 2015-08-07 DIAGNOSIS — R05 Cough: Secondary | ICD-10-CM | POA: Insufficient documentation

## 2015-08-07 DIAGNOSIS — R42 Dizziness and giddiness: Secondary | ICD-10-CM

## 2015-08-07 DIAGNOSIS — D509 Iron deficiency anemia, unspecified: Secondary | ICD-10-CM

## 2015-08-07 DIAGNOSIS — J398 Other specified diseases of upper respiratory tract: Secondary | ICD-10-CM

## 2015-08-07 DIAGNOSIS — R682 Dry mouth, unspecified: Secondary | ICD-10-CM

## 2015-08-07 DIAGNOSIS — R609 Edema, unspecified: Secondary | ICD-10-CM

## 2015-08-07 DIAGNOSIS — E039 Hypothyroidism, unspecified: Secondary | ICD-10-CM | POA: Diagnosis not present

## 2015-08-07 DIAGNOSIS — N183 Chronic kidney disease, stage 3 unspecified: Secondary | ICD-10-CM

## 2015-08-07 DIAGNOSIS — K117 Disturbances of salivary secretion: Secondary | ICD-10-CM

## 2015-08-07 DIAGNOSIS — R059 Cough, unspecified: Secondary | ICD-10-CM

## 2015-08-07 HISTORY — DX: Cough, unspecified: R05.9

## 2015-08-07 NOTE — Progress Notes (Signed)
Patient ID: Ricardo [name], male   DOB: [birth year], 82 y.o.   MRN: [id number]    [facility]  Place of Service: Clinic (12)     Allergies  Allergen Reactions  . Indocin [Indomethacin]     Chief Complaint  Patient presents with  . Medical Management of Chronic Issues    4 month medication management anemia, thyroid, blood pressure, CKD. Turned "100" last month!  . multiple complains    severe cough, vertigo, dry mouth, arthritis, excess mucus.    HPI:  Cough - Patient states sometimes he feels explosive coughing spells. This may happen more frequently meals. Somewhat suggestive of aspiration.  Mucus hypersecretion - continues to complain of too much saliva.   Dry mouth - patient is taking oxybutynin for bladder control, but is difficult to say whether this is really helpful. It certainly seems to be associated with complaints of dry mouth..  Arthritis - generalized. Difficult.  Iron deficiency anemia - Hgb 11.2 in April 2017.  Urinary frequency - currently treated with oxybutynin, but this seems to be creating a dry mouth problem  HTN - occasional elevations of SBP  Edema - 1+ bipedal  Medications: Patient's Medications  New Prescriptions   No medications on file  Previous Medications   ACETAMINOPHEN (TYLENOL) 325 MG TABLET    Take 650 mg by mouth every 6 (six) hours as needed for mild pain, moderate pain or fever.   CARBAMIDE PEROXIDE (DEBROX) 6.5 % OTIC SOLUTION    Instill 3-5 Drops into each ear once a week on Saturday and on Sunday at bedtime flush   CHOLECALCIFEROL (VITAMIN D) 1000 UNITS TABLET    Take 2,000 Units by mouth daily.    FINASTERIDE (PROSCAR) 5 MG TABLET    Take 1 tablet by mouth daily.   GUAIFENESIN (ROBITUSSIN) 100 MG/5ML LIQUID    Take 10 CC by mouth every 4 hours as needed for cough or excessive mucus   LEVOTHYROXINE (SYNTHROID, LEVOTHROID) 50 MCG TABLET    Take 50 mcg by mouth every other day.   LOSARTAN (COZAAR) 25 MG TABLET    Take 1 tablet by mouth daily.   MECLIZINE (ANTIVERT) 25 MG TABLET    Take 25 mg by mouth 2 (two) times daily.    OMEPRAZOLE (PRILOSEC) 20 MG CAPSULE    Take 20 mg by mouth daily.   OXYBUTYNIN (DITROPAN) 5 MG TABLET    Take 5 mg by mouth 2 (two) times daily.   OXYCODONE (OXYCONTIN) 10 MG 12 HR TABLET    Take one tablet by mouth every morning for pain. Do not crush or chew.   OXYCODONE (ROXICODONE) 5 MG IMMEDIATE RELEASE TABLET    Take one tablet by mouth twice daily as needed for breakthrough pain   POLYETHYLENE GLYCOL POWDER (GLYCOLAX/MIRALAX) POWDER    Take 127.5 g by mouth daily. 17g once a day in liquid for constipation.   POLYVINYL ALCOHOL (ARTIFICIAL TEARS) 1.4 % OPHTHALMIC SOLUTION    1 drop. In both eyes twice daily   SENNA-DOCUSATE (SENEXON-S) 8.6-50 MG PER TABLET    Take 1 tablet by mouth every other day.  Modified Medications   No medications on file  Discontinued Medications   No medications on file     Review of Systems  Constitutional: Negative for fever.       Elderly. Frail.  HENT: Positive for hearing loss.        Xerostomia  Eyes: Negative.  Negative  for discharge and redness.  Respiratory: Negative for cough, chest tightness, shortness of breath and wheezing.        Sternal area discomfort resolved  Cardiovascular: Positive for leg swelling. Negative for chest pain and palpitations.       Bradycardia at 60 bpm.  Gastrointestinal: Negative for nausea, vomiting, abdominal pain, diarrhea, constipation, blood in stool, abdominal distention and anal bleeding.  Endocrine: Negative.   Genitourinary: Positive for frequency.  Musculoskeletal: Positive for back pain, arthralgias and gait problem. Negative for myalgias, joint swelling, neck pain and neck stiffness.  Skin: Negative.  Negative for rash.  Allergic/Immunologic: Negative.   Neurological: Negative.  Negative for weakness.       On and off tinglings in the left fingers-can be  relieved by touching his forehead with left fingers.   Psychiatric/Behavioral: Negative for hallucinations, behavioral problems, confusion, sleep disturbance and agitation. The patient is not nervous/anxious and is not hyperactive.     Filed Vitals:   08/07/15 0948  BP: 124/70  Pulse: 75  Temp: 97.7 F (36.5 C)  Height: '5\' 10"'$  (1.778 m)  Weight: 151 lb (68.493 kg)  SpO2: 95%   Wt Readings from Last 3 Encounters:  08/07/15 151 lb (68.493 kg)  06/05/15 150 lb (68.04 kg)  04/24/15 156 lb (70.761 kg)    Body mass index is 21.67 kg/(m^2).  Physical Exam  Constitutional: He is oriented to person, place, and time. He appears well-developed and well-nourished. No distress.  HENT:  Right Ear: External ear normal.  Left Ear: External ear normal.  Nose: Nose normal.  Mouth/Throat: Oropharynx is clear and moist. No oropharyngeal exudate.  Severe bilateral hearing loss  Eyes: Conjunctivae and EOM are normal. Pupils are equal, round, and reactive to light.  Neck: No JVD present. No tracheal deviation present. No thyromegaly present.  Cardiovascular: Regular rhythm and intact distal pulses.  Exam reveals no gallop and no friction rub.   No murmur heard. Bradycardia at 60 bpm. 3/6 systolic ejection murmur at aortic area.  Pulmonary/Chest: No respiratory distress. He has no wheezes. He has no rales. He exhibits no tenderness.  Abdominal: He exhibits no distension and no mass. There is no tenderness.  Musculoskeletal: He exhibits edema (1+ bilaterally in lower legs and feet). He exhibits no tenderness.  Tender in the low back with percussion. Unstable when standing. Rides in a wheelchair.   Lymphadenopathy:    He has no cervical adenopathy.  Neurological: He is alert and oriented to person, place, and time. He has normal reflexes. No cranial nerve deficit. Coordination normal.  Skin: No rash noted. No erythema. No pallor.  Psychiatric: He has a normal mood and affect. His behavior is  normal. Judgment and thought content normal.     Labs reviewed: Lab Summary Latest Ref Rng 06/25/2015 04/26/2015 12/11/2014 11/09/2014  Hemoglobin 13.5 - 17.5 g/dL 11.2(A) 12.1(A) 10.2(A) 9.7(A)  Hematocrit 41 - 53 % 34(A) 37(A) 30(A) 29(A)  White count - 6.2 7.3 6.0 5.7  Platelet count 150 - 399 K/L 185 174 157 153  Sodium 137 - 147 mmol/L 137 137 (None) (None)  Potassium 3.4 - 5.3 mmol/L 4.8 4.6 (None) (None)  Calcium - (None) (None) (None) (None)  Phosphorus - (None) (None) (None) (None)  Creatinine 0.6 - 1.3 mg/dL 1.4(A) 1.4(A) (None) (None)  AST 14 - 40 U/L 17 18 (None) (None)  Alk Phos 25 - 125 U/L 107 107 (None) (None)  Bilirubin - (None) (None) (None) (None)  Glucose - 89 91 (None) (None)  Cholesterol 0 - 200 mg/dL 169 (None) (None) (None)  HDL cholesterol 35 - 70 mg/dL 51 (None) (None) (None)  Triglycerides 40 - 160 mg/dL 82 (None) (None) (None)  LDL Direct - (None) (None) (None) (None)  LDL Calc - 102 (None) (None) (None)  Total protein - (None) (None) (None) (None)  Albumin - (None) (None) (None) (None)   Lab Results  Component Value Date   TSH 3.70 06/25/2015   Lab Results  Component Value Date   BUN 35* 06/25/2015   BUN 34* 04/26/2015   BUN 33* 09/20/2014   Lab Results  Component Value Date   CREATININE 1.4* 06/25/2015   CREATININE 1.4* 04/26/2015   CREATININE 1.6* 09/20/2014   No results found for: HGBA1C     Assessment/Plan  1. Cough Consider swallowing evaluation this continues to be a problem  2. Chronic mucus hypersecretion, respiratory Chronic condition  3. Xerostomia Discontinue Ditropan and oxybutynin  4. VERTIGO Discontinue meclizine  5. Chronic kidney disease, stage III (moderate) Stable with recent lab work  6. Iron deficiency anemia Stable  7. Essential hypertension Reasonably well controlled on losartan  8. Edema, unspecified type Stable  9. Hypothyroidism, unspecified hypothyroidism type Compensated

## 2015-10-05 ENCOUNTER — Non-Acute Institutional Stay: Payer: Medicare PPO | Admitting: Nurse Practitioner

## 2015-10-05 ENCOUNTER — Encounter: Payer: Self-pay | Admitting: Nurse Practitioner

## 2015-10-05 DIAGNOSIS — E039 Hypothyroidism, unspecified: Secondary | ICD-10-CM | POA: Diagnosis not present

## 2015-10-05 DIAGNOSIS — D509 Iron deficiency anemia, unspecified: Secondary | ICD-10-CM | POA: Diagnosis not present

## 2015-10-05 DIAGNOSIS — R609 Edema, unspecified: Secondary | ICD-10-CM | POA: Diagnosis not present

## 2015-10-05 DIAGNOSIS — K219 Gastro-esophageal reflux disease without esophagitis: Secondary | ICD-10-CM | POA: Diagnosis not present

## 2015-10-05 DIAGNOSIS — K5900 Constipation, unspecified: Secondary | ICD-10-CM

## 2015-10-05 DIAGNOSIS — F418 Other specified anxiety disorders: Secondary | ICD-10-CM

## 2015-10-05 DIAGNOSIS — I10 Essential (primary) hypertension: Secondary | ICD-10-CM | POA: Diagnosis not present

## 2015-10-05 DIAGNOSIS — N400 Enlarged prostate without lower urinary tract symptoms: Secondary | ICD-10-CM

## 2015-10-05 DIAGNOSIS — M150 Primary generalized (osteo)arthritis: Secondary | ICD-10-CM | POA: Diagnosis not present

## 2015-10-05 DIAGNOSIS — N183 Chronic kidney disease, stage 3 unspecified: Secondary | ICD-10-CM

## 2015-10-05 DIAGNOSIS — M159 Polyosteoarthritis, unspecified: Secondary | ICD-10-CM

## 2015-10-05 NOTE — Assessment & Plan Note (Signed)
Chronic, w/c for mobility, Oxycontin 10mg  qam

## 2015-10-05 NOTE — Assessment & Plan Note (Signed)
Stable, continue Omeprazole 20mg  daily, resolved cough and sternal area discomfort

## 2015-10-05 NOTE — Progress Notes (Signed)
Location:   [facility] Room Number: 32-A Place of Service:  SNF (31) Provider:  Kharon Hixon NP  Murray Hodgkins, MD  Patient Care Team: Kimber Relic, MD as PCP - General (Internal Medicine) Tamitha Norell Johnney Ou, NP as Nurse Practitioner (Internal Medicine)  Extended Emergency Contact Information Primary Emergency Contact: [name], [place] Home Phone: [phone] Relation: Son Secondary Emergency Contact: [name] [place] Home Phone: [phone] Relation: Grandson  Code Status:  DNR Goals of care: Advanced Directive information Advanced Directives 10/05/2015  Does patient have an advance directive? Yes  Type of Estate agent of [place];Out of facility DNR (pink MOST or yellow form)  Does patient want to make changes to advanced directive? No - Patient declined  Copy of advanced directive(s) in chart? Yes  Pre-existing out of facility DNR order (yellow form or pink MOST form) -     Chief Complaint  Patient presents with  . Medical Management of Chronic Issues    HPI:  Pt is a 82 y.o. male seen today for medical management of chronic diseases.     Hx of HTN, controlled on Losartan , urinary frequency, managed with Finasteride , hypothyroidism, taking Levothyroxine ,  depression, mood is stable, off mood stabilizer, asymptomatic GERD while on Omeprazole  daily, memory deficit, resides in AL for care needs, chronic lower back pain, managed with Tylenol  q6h prn, Oxycodone  qam, bid prn, no constipation while on MiraLax daily, Senna I qod, chronic dizziness is managed with Meclizine  bid.   Past Medical History:  Diagnosis Date  . AAA (abdominal aortic aneurysm) (HCC)   . Abnormality of gait 08/01/2010  . Adhesive capsulitis of shoulder 08/01/2010   "Frozen" right shoulder  . Anemia   . Anxiety state, unspecified 08/01/2010  . Arthritis   .  Congestive heart failure, unspecified 08/01/2010  . Cough 08/07/2015  . Disorders of bursae and tendons in shoulder region, unspecified 08/01/2010  . Edema 08/01/2010  . Fall   . Fecal impaction (HCC)   . GERD (gastroesophageal reflux disease)   . Hiatal hernia   . Hypercalcemia   . Hyperlipidemia   . Hypertension   . Hypertrophy of prostate without urinary obstruction and other lower urinary tract symptoms (LUTS) 08/01/2010  . Hyponatremia   . Hypothyroidism   . Infectious colitis   . Insomnia, unspecified 08/01/2010  . Joint pain   . Leg pain    with walking  . Lumbago 12/24/2010  . Orthostatic hypotension 11/05/2010  . Other specified cardiac dysrhythmias(427.89) 12/24/2010  . Pain in joint, pelvic region and thigh 08/06/2010  . Peptic ulcer, unspecified site, unspecified as acute or chronic, without mention of hemorrhage, perforation, or obstruction 08/01/2010  . Renal failure, acute (HCC)   . Swelling, mass, or lump in head and neck 01/03/2011  . Ulcer    Stomach  . Unspecified disorder of kidney and ureter 08/01/2010  . Unspecified hearing loss 08/01/2010  . Unspecified vitamin D deficiency 08/06/2010  . Urinary frequency 09/13/2010  . Vertigo    Past Surgical History:  Procedure Laterality Date  . ABDOMINAL AORTIC ANEURYSM REPAIR  12-18-10   Stent graft repair of AAA  . APPENDECTOMY    . CATARACT EXTRACTION    . HEMORRHOID SURGERY  2000  . INGUINAL HERNIA REPAIR Left 2005  . TRANSTHORACIC ECHOCARDIOGRAM  07/19/2010    Left ventricle: There is hypokinesis  of the inferior wall and  posterior wall. The EF is 35-40%. The cavity size was normal    Allergies  Allergen Reactions  . Aleve [Naproxen]   . Indocin [Indomethacin]       Medication List       Accurate as of 10/05/15  3:53 PM. Always use your most recent med list.          acetaminophen 325 MG tablet Commonly known as:  TYLENOL Take 650 mg by mouth every 6 (six) hours as needed for mild pain, moderate pain or  fever.   ARTIFICIAL TEARS 1.4 % ophthalmic solution Generic drug:  polyvinyl alcohol 1 drop. In both eyes twice daily   carbamide peroxide 6.5 % otic solution Commonly known as:  DEBROX Instill 3-5 Drops into each ear once a week on Saturday and on Sunday at bedtime flush   cholecalciferol 1000 units tablet Commonly known as:  VITAMIN D Take 2,000 Units by mouth daily.   finasteride 5 MG tablet Commonly known as:  PROSCAR Take 1 tablet by mouth daily.   guaiFENesin 100 MG/5ML liquid Commonly known as:  ROBITUSSIN Take 10 CC by mouth every 4 hours as needed for cough or excessive mucus   levothyroxine 50 MCG tablet Commonly known as:  SYNTHROID, LEVOTHROID Take 50 mcg by mouth every other day.   losartan 25 MG tablet Commonly known as:  COZAAR Take 1 tablet by mouth daily.   omeprazole 20 MG capsule Commonly known as:  PRILOSEC Take 20 mg by mouth daily.   oxyCODONE 10 mg 12 hr tablet Commonly known as:  OXYCONTIN Take one tablet by mouth every morning for pain. Do not crush or chew.   oxyCODONE 5 MG immediate release tablet Commonly known as:  ROXICODONE Take one tablet by mouth twice daily as needed for breakthrough pain   polyethylene glycol powder powder Commonly known as:  GLYCOLAX/MIRALAX Take 127.5 g by mouth daily. 17g once a day in liquid for constipation.   SENEXON-S 8.6-50 MG tablet Generic drug:  senna-docusate Take 1 tablet by mouth every other day.       Review of Systems  Constitutional: Negative for fever.       Elderly. Frail.  HENT: Positive for hearing loss.        Xerostomia  Eyes: Negative.  Negative for discharge and redness.  Respiratory: Negative for cough, chest tightness, shortness of breath and wheezing.        Sternal area discomfort resolved  Cardiovascular: Positive for leg swelling. Negative for chest pain and palpitations.       Bradycardia at 60 bpm.  Gastrointestinal: Negative for abdominal distention, abdominal pain,  anal bleeding, blood in stool, constipation, diarrhea, nausea and vomiting.  Endocrine: Negative.   Genitourinary: Positive for frequency.  Musculoskeletal: Positive for arthralgias, back pain and gait problem. Negative for joint swelling, myalgias, neck pain and neck stiffness.  Skin: Negative.  Negative for rash.  Allergic/Immunologic: Negative.   Neurological: Negative for weakness.       On and off tinglings in the left fingers-can be relieved by touching his forehead with left fingers.   Psychiatric/Behavioral: Negative for agitation, behavioral problems, confusion, hallucinations and sleep disturbance. The patient is not nervous/anxious and is not hyperactive.     Immunization History  Administered Date(s) Administered  . Influenza-Unspecified 12/27/2012, 12/13/2013, 12/14/2014  . Pneumococcal Polysaccharide-23 03/10/2005  . Td 03/11/1999   Pertinent  Health Maintenance Due  Topic Date Due  . PNA vac Low Risk Adult (  2 of 2 - PCV13) 03/10/2006  . INFLUENZA VACCINE  10/09/2015   Fall Risk  08/07/2015 08/07/2015 04/10/2015 03/27/2015  Falls in the past year? Yes No No No   Functional Status Survey:    Vitals:   10/05/15 1411  BP: 122/74  Pulse: 61  Resp: (!) 22  Temp: (!) 96.8 F (36 C)  Weight: 150 lb (68 kg)  Height: 5\' 10"  (1.778 m)   Body mass index is 21.52 kg/m. Physical Exam  Constitutional: He is oriented to person, place, and time. He appears well-developed and well-nourished. No distress.  HENT:  Right Ear: External ear normal.  Left Ear: External ear normal.  Nose: Nose normal.  Mouth/Throat: Oropharynx is clear and moist. No oropharyngeal exudate.  Severe bilateral hearing loss  Eyes: Conjunctivae and EOM are normal. Pupils are equal, round, and reactive to light.  Neck: No JVD present. No tracheal deviation present. No thyromegaly present.  Cardiovascular: Regular rhythm and intact distal pulses.  Exam reveals no gallop and no friction rub.   No murmur  heard. Bradycardia at 60 bpm. 3/6 systolic ejection murmur at aortic area.  Pulmonary/Chest: No respiratory distress. He has no wheezes. He has no rales. He exhibits no tenderness.  Abdominal: He exhibits no distension and no mass. There is no tenderness.  Musculoskeletal: He exhibits edema (1+ bilaterally in lower legs and feet). He exhibits no tenderness.  Tender in the low back with percussion. Unstable when standing. Rides in a wheelchair.   Lymphadenopathy:    He has no cervical adenopathy.  Neurological: He is alert and oriented to person, place, and time. He has normal reflexes. No cranial nerve deficit. Coordination normal.  Skin: No rash noted. No erythema. No pallor.  Psychiatric: He has a normal mood and affect. His behavior is normal. Judgment and thought content normal.    Labs reviewed:  Recent Labs  04/26/15 06/25/15  NA 137 137  K 4.6 4.8  BUN 34* 35*  CREATININE 1.4* 1.4*    Recent Labs  04/26/15 06/25/15  AST 18 17  ALT 8* 8*  ALKPHOS 107 107    Recent Labs  12/11/14 04/26/15 06/25/15  WBC 6.0 7.3 6.2  HGB 10.2* 12.1* 11.2*  HCT 30* 37* 34*  PLT 157 174 185   Lab Results  Component Value Date   TSH 3.70 06/25/2015   No results found for: HGBA1C Lab Results  Component Value Date   CHOL 169 06/25/2015   HDL 51 06/25/2015   LDLCALC 102 06/25/2015   TRIG 82 06/25/2015    Significant Diagnostic Results in last 30 days:  No results found.  Assessment/Plan There are no diagnoses linked to this encounter.Essential hypertension Blood pressure is controlled, 09/19/14 CXR no acute cardiopulmonary pathology appreciated. Takes Losartan 50m mg, off Furosemide  GERD Stable, continue Omeprazole 20mg  daily, resolved cough and sternal area discomfort   Constipation Stable, continue Senokot S I qod and MiraLax daily.    Hypothyroidism 06/25/15 TSH 3.7, continue  Levothyroxine   Osteoarthritis Chronic, w/c for mobility, Oxycontin 10mg   qam   Chronic kidney disease, stage III (moderate) Off Furosemide, 06/25/15 Na137, K 4.8, Bun 35, creat 1.37   BPH (benign prostatic hyperplasia) No urinary retention, takes Oxybutynin 5mg  bid, Finasteride 5mg  daily  Iron deficiency anemia Stable, Hgb 11.2 06/25/15  Edema Trace in ankles, off diuretics   Depression with anxiety Stable on Sertraline 50mg  daily.      Family/ staff Communication: continue AL for care assistance  Labs/tests  ordered:  none

## 2015-10-05 NOTE — Assessment & Plan Note (Signed)
Stable, Hgb 11.2 06/25/15

## 2015-10-05 NOTE — Assessment & Plan Note (Signed)
Blood pressure is controlled, 09/19/14 CXR no acute cardiopulmonary pathology appreciated. Takes Losartan 19m mg, off Furosemide

## 2015-10-05 NOTE — Assessment & Plan Note (Addendum)
Off Furosemide, 06/25/15 Na137, K 4.8, Bun 35, creat 1.37

## 2015-10-05 NOTE — Assessment & Plan Note (Signed)
Stable, continue Senokot S I qod and MiraLax daily.

## 2015-10-05 NOTE — Assessment & Plan Note (Signed)
No urinary retention, takes Oxybutynin 5mg bid, Finasteride 5mg daily 

## 2015-10-05 NOTE — Assessment & Plan Note (Signed)
06/25/15 TSH 3.7, continue  Levothyroxine 50mcg   

## 2015-10-05 NOTE — Assessment & Plan Note (Signed)
Stable on Sertraline 50mg  daily.

## 2015-10-05 NOTE — Assessment & Plan Note (Signed)
Trace in ankles, off diuretics

## 2015-10-19 DIAGNOSIS — H6121 Impacted cerumen, right ear: Secondary | ICD-10-CM | POA: Diagnosis not present

## 2015-11-01 ENCOUNTER — Non-Acute Institutional Stay: Payer: Medicare PPO | Admitting: Internal Medicine

## 2015-11-01 ENCOUNTER — Encounter: Payer: Self-pay | Admitting: Internal Medicine

## 2015-11-01 DIAGNOSIS — R609 Edema, unspecified: Secondary | ICD-10-CM | POA: Diagnosis not present

## 2015-11-01 DIAGNOSIS — N183 Chronic kidney disease, stage 3 unspecified: Secondary | ICD-10-CM

## 2015-11-01 DIAGNOSIS — I10 Essential (primary) hypertension: Secondary | ICD-10-CM | POA: Diagnosis not present

## 2015-11-01 DIAGNOSIS — E039 Hypothyroidism, unspecified: Secondary | ICD-10-CM

## 2015-11-01 DIAGNOSIS — E785 Hyperlipidemia, unspecified: Secondary | ICD-10-CM

## 2015-11-01 DIAGNOSIS — D509 Iron deficiency anemia, unspecified: Secondary | ICD-10-CM | POA: Diagnosis not present

## 2015-11-01 NOTE — Progress Notes (Signed)
Progress Note    Location:   [facility] Room Number: AL32 Place of Service:  ALF [phone] Provider:  Murray HodgkinsArthur Avionna Bower, MD  Patient Care Team: Kimber RelicArthur G Seara Hinesley, MD as PCP - General (Internal Medicine) Man Johnney OuX Mast, NP as Nurse Practitioner (Internal Medicine)  Extended Emergency Contact Information Primary Emergency Contact: [name],Phillip          [place], [place] Home Phone: [phone] Relation: Son Secondary Emergency Contact: [name],[name]  [place] Home Phone: [phone] Relation: Grandson  Code Status:  Full Code Goals of care: Advanced Directive information Advanced Directives 11/01/2015  Does patient have an advance directive? Yes  Type of Estate agentAdvance Directive Healthcare Power of [place];Living will;Out of facility DNR (pink MOST or yellow form)  Does patient want to make changes to advanced directive? -  Copy of advanced directive(s) in chart? Yes  Pre-existing out of facility DNR order (yellow form or pink MOST form) -     Chief Complaint  Patient presents with  . Medical Management of Chronic Issues    routine    HPI:  Pt is a [age] y.o. male seen today for medical management of chronic diseases.  Patient says he is feeling relatively well. He has his chronic complaints of shoulder discomfort and swollen legs.  Essential hypertension - controlled  Edema, unspecified type - chronic and unchanged at about 2+ in the lower legs bilaterally -   Hypothyroidism, unspecified hypothyroidism type - normal TSH in April 2017  Chronic kidney disease, stage III (moderate) - stable BUN/creatinine April 2017  Dyslipidemia - controlled on April 2017   Iron deficiency anemia -last hemoglobin 10.2 in April 2017       Past Medical History:  Diagnosis Date  . AAA (abdominal aortic aneurysm) (HCC)   . Abnormality of gait 08/01/2010  . Adhesive capsulitis of shoulder 08/01/2010   "Frozen" right shoulder  . Anemia   .  Anxiety state, unspecified 08/01/2010  . Arthritis   . Congestive heart failure, unspecified 08/01/2010  . Cough 08/07/2015  . Disorders of bursae and tendons in shoulder region, unspecified 08/01/2010  . Edema 08/01/2010  . Fall   . Fecal impaction (HCC)   . GERD (gastroesophageal reflux disease)   . Hiatal hernia   . Hypercalcemia   . Hyperlipidemia   . Hypertension   . Hypertrophy of prostate without urinary obstruction and other lower urinary tract symptoms (LUTS) 08/01/2010  . Hyponatremia   . Hypothyroidism   . Infectious colitis   . Insomnia, unspecified 08/01/2010  . Joint pain   . Leg pain    with walking  . Lumbago 12/24/2010  . Orthostatic hypotension 11/05/2010  . Other specified cardiac dysrhythmias(427.89) 12/24/2010  . Pain in joint, pelvic region and thigh 08/06/2010  . Peptic ulcer, unspecified site, unspecified as acute or chronic, without mention of hemorrhage, perforation, or obstruction 08/01/2010  . Renal failure, acute (HCC)   . Swelling, mass, or lump in head and neck 01/03/2011  . Ulcer    Stomach  . Unspecified disorder of kidney and ureter 08/01/2010  . Unspecified hearing loss 08/01/2010  . Unspecified vitamin D deficiency 08/06/2010  . Urinary frequency 09/13/2010  . Vertigo    Past Surgical History:  Procedure Laterality Date  . ABDOMINAL AORTIC ANEURYSM REPAIR  12-18-10   Stent graft repair of AAA  . APPENDECTOMY    . CATARACT EXTRACTION    . HEMORRHOID SURGERY  2000  . INGUINAL HERNIA REPAIR  Left 2005  . TRANSTHORACIC ECHOCARDIOGRAM  07/19/2010    Left ventricle: There is hypokinesis of the inferior wall and  posterior wall. The EF is 35-40%. The cavity size was normal    Allergies  Allergen Reactions  . Aleve [Naproxen]   . Indocin [Indomethacin]       Medication List       Accurate as of 11/01/15 11:30 AM. Always use your most recent med list.          acetaminophen 325 MG tablet Commonly known as:  TYLENOL Take 650 mg by mouth every 6  (six) hours as needed for mild pain, moderate pain or fever.   ARTIFICIAL TEARS 1.4 % ophthalmic solution Generic drug:  polyvinyl alcohol 1 drop. In both eyes twice daily   carbamide peroxide 6.5 % otic solution Commonly known as:  DEBROX Instill 3-5 Drops into each ear once a week on Saturday and on Sunday at bedtime flush   cholecalciferol 1000 units tablet Commonly known as:  VITAMIN D Take 2,000 Units by mouth daily.   finasteride 5 MG tablet Commonly known as:  PROSCAR Take 1 tablet by mouth daily.   guaiFENesin 100 MG/5ML liquid Commonly known as:  ROBITUSSIN Take 10 CC by mouth every 4 hours as needed for cough or excessive mucus   levothyroxine 50 MCG tablet Commonly known as:  SYNTHROID, LEVOTHROID Take 50 mcg by mouth every other day.   losartan 25 MG tablet Commonly known as:  COZAAR Take 1 tablet by mouth daily.   nystatin-triamcinolone cream Commonly known as:  MYCOLOG II Apply 1 application topically. Apply to head and foot affected areas twice a day until healed   omeprazole 20 MG capsule Commonly known as:  PRILOSEC Take 20 mg by mouth daily.   ORAJEL MT Use as directed in the mouth or throat. Apply to mouth sores with cotton applicatory every 4-6 hours as needed   oxybutynin 5 MG 24 hr tablet Commonly known as:  DITROPAN-XL Take 5 mg by mouth. Take one tablet daily for bladder   oxyCODONE 10 mg 12 hr tablet Commonly known as:  OXYCONTIN Take one tablet by mouth every morning for pain. Do not crush or chew.   oxyCODONE 5 MG immediate release tablet Commonly known as:  ROXICODONE Take one tablet by mouth twice daily as needed for breakthrough pain   polyethylene glycol powder powder Commonly known as:  GLYCOLAX/MIRALAX Take 127.5 g by mouth daily. 17g once a day in liquid for constipation.   SENEXON-S 8.6-50 MG tablet Generic drug:  senna-docusate Take 1 tablet by mouth every other day.       Review of Systems  Constitutional: Negative  for fever.       Elderly. Frail.  HENT: Positive for hearing loss.        Xerostomia  Eyes: Negative.  Negative for discharge and redness.  Respiratory: Negative for cough, chest tightness, shortness of breath and wheezing.        Sternal area discomfort resolved  Cardiovascular: Positive for leg swelling. Negative for chest pain and palpitations.       Bradycardia at 60 bpm.  Gastrointestinal: Negative for abdominal distention, abdominal pain, anal bleeding, blood in stool, constipation, diarrhea, nausea and vomiting.  Endocrine: Negative.   Genitourinary: Positive for frequency.  Musculoskeletal: Positive for arthralgias (chronic shoulder pains), back pain and gait problem. Negative for joint swelling, myalgias, neck pain and neck stiffness.  Skin: Negative.  Negative for rash.  Allergic/Immunologic: Negative.  Neurological: Negative for weakness.       On and off tinglings in the left fingers-can be relieved by touching his forehead with left fingers.   Psychiatric/Behavioral: Negative for agitation, behavioral problems, confusion, hallucinations and sleep disturbance. The patient is not nervous/anxious and is not hyperactive.     Immunization History  Administered Date(s) Administered  . Influenza-Unspecified 12/27/2012, 12/13/2013, 12/14/2014  . Pneumococcal Polysaccharide-23 03/10/2005  . Td 03/11/1999   Pertinent  Health Maintenance Due  Topic Date Due  . PNA vac Low Risk Adult (2 of 2 - PCV13) 03/10/2006  . INFLUENZA VACCINE  10/09/2015   Fall Risk  08/07/2015 08/07/2015 04/10/2015 03/27/2015  Falls in the past year? Yes No No No     Vitals:   11/01/15 1111  BP: 132/75  Pulse: (!) 53  Resp: 18  Temp: 97 F (36.1 C)  Weight: 150 lb (68 kg)  Height: 5\' 10"  (1.778 m)   Body mass index is 21.52 kg/m. Physical Exam  Constitutional: He is oriented to person, place, and time. He appears well-developed and well-nourished. No distress.  HENT:  Right Ear: External ear  normal.  Left Ear: External ear normal.  Nose: Nose normal.  Mouth/Throat: Oropharynx is clear and moist. No oropharyngeal exudate.  Severe bilateral hearing loss  Eyes: Conjunctivae and EOM are normal. Pupils are equal, round, and reactive to light.  Neck: No JVD present. No tracheal deviation present. No thyromegaly present.  Cardiovascular: Regular rhythm and intact distal pulses.  Exam reveals no gallop and no friction rub.   No murmur heard. Bradycardia at 60 bpm. 3/6 systolic ejection murmur at aortic area.  Pulmonary/Chest: No respiratory distress. He has no wheezes. He has no rales. He exhibits no tenderness.  Abdominal: He exhibits no distension and no mass. There is no tenderness.  Musculoskeletal: He exhibits edema (1+ bilaterally in lower legs and feet). He exhibits no tenderness.  Tender in the low back with percussion. Unstable when standing. Rides in a wheelchair.   Lymphadenopathy:    He has no cervical adenopathy.  Neurological: He is alert and oriented to person, place, and time. He has normal reflexes. No cranial nerve deficit. Coordination normal.  Skin: No rash noted. No erythema. No pallor.  Psychiatric: He has a normal mood and affect. His behavior is normal. Judgment and thought content normal.    Labs reviewed:  Recent Labs  04/26/15 06/25/15  NA 137 137  K 4.6 4.8  BUN 34* 35*  CREATININE 1.4* 1.4*    Recent Labs  04/26/15 06/25/15  AST 18 17  ALT 8* 8*  ALKPHOS 107 107    Recent Labs  12/11/14 04/26/15 06/25/15  WBC 6.0 7.3 6.2  HGB 10.2* 12.1* 11.2*  HCT 30* 37* 34*  PLT 157 174 185   Lab Results  Component Value Date   TSH 3.70 06/25/2015   No results found for: HGBA1C Lab Results  Component Value Date   CHOL 169 06/25/2015   HDL 51 06/25/2015   LDLCALC 102 06/25/2015   TRIG 82 06/25/2015     Assessment/Plan  1. Essential hypertension controlled  2. Edema, unspecified type unchanged  3. Hypothyroidism, unspecified  hypothyroidism type compensated  4. Chronic kidney disease, stage III (moderate) stable  5. Dyslipidemia controlled  6. Iron deficiency anemia -CBC, retic, SI, IBC, Iron sat

## 2015-11-05 ENCOUNTER — Encounter: Payer: Self-pay | Admitting: Internal Medicine

## 2015-11-05 DIAGNOSIS — D509 Iron deficiency anemia, unspecified: Secondary | ICD-10-CM | POA: Diagnosis not present

## 2015-11-05 LAB — CBC AND DIFFERENTIAL
HEMATOCRIT: 36 % — AB (ref 41–53)
HEMOGLOBIN: 11.9 g/dL — AB (ref 13.5–17.5)
PLATELETS: 186 10*3/uL (ref 150–399)
WBC: 6.1 10*3/mL

## 2015-11-07 ENCOUNTER — Other Ambulatory Visit: Payer: Self-pay

## 2015-11-07 MED ORDER — OXYCODONE HCL ER 10 MG PO T12A: EXTENDED_RELEASE_TABLET | ORAL | 0 refills | Status: DC

## 2015-11-13 ENCOUNTER — Other Ambulatory Visit: Payer: Self-pay

## 2015-11-29 DIAGNOSIS — Z79899 Other long term (current) drug therapy: Secondary | ICD-10-CM | POA: Diagnosis not present

## 2015-12-13 ENCOUNTER — Non-Acute Institutional Stay: Payer: Medicare PPO | Admitting: Internal Medicine

## 2015-12-13 ENCOUNTER — Encounter: Payer: Self-pay | Admitting: Internal Medicine

## 2015-12-13 DIAGNOSIS — D509 Iron deficiency anemia, unspecified: Secondary | ICD-10-CM | POA: Diagnosis not present

## 2015-12-13 DIAGNOSIS — E039 Hypothyroidism, unspecified: Secondary | ICD-10-CM | POA: Diagnosis not present

## 2015-12-13 DIAGNOSIS — I10 Essential (primary) hypertension: Secondary | ICD-10-CM | POA: Diagnosis not present

## 2015-12-13 DIAGNOSIS — N183 Chronic kidney disease, stage 3 unspecified: Secondary | ICD-10-CM

## 2015-12-13 NOTE — Progress Notes (Signed)
Progress Note    Location:  [facility] Room Number: AL32 Place of Service:  ALF [phone](13) Provider:  Murray HodgkinsArthur Emari Hreha, MD  Patient Care Team: Kimber RelicArthur G Lilliam Chamblee, MD as PCP - General (Internal Medicine) Man Johnney OuX Mast, NP as Nurse Practitioner (Internal Medicine)  Extended Emergency Contact Information Primary Emergency Contact: [name],Phillip          [place], [place] Home Phone: [phone] Relation: Son Secondary Emergency Contact: [name],[name]  [place] Home Phone: [phone] Relation: Grandson  Code Status:  Full Code Goals of care: Advanced Directive information Advanced Directives 12/13/2015  Does patient have an advance directive? Yes  Type of Estate agentAdvance Directive Healthcare Power of [place];Living will;Out of facility DNR (pink MOST or yellow form)  Does patient want to make changes to advanced directive? -  Copy of advanced directive(s) in chart? Yes  Pre-existing out of facility DNR order (yellow form or pink MOST form) Pink MOST form placed in chart (order not valid for inpatient use)     Chief Complaint  Patient presents with  . Medical Management of Chronic Issues    routine    HPI:  Pt is a [age] y.o. male seen today for medical management of chronic diseases.    Essential hypertension - controlled  Hypothyroidism, unspecified type - compensated  Iron deficiency anemia, unspecified iron deficiency anemia type - stable  Chronic kidney disease, stage III (moderate) - stable     Past Medical History:  Diagnosis Date  . AAA (abdominal aortic aneurysm) (HCC)   . Abnormality of gait 08/01/2010  . Adhesive capsulitis of shoulder 08/01/2010   "Frozen" right shoulder  . Anemia   . Anxiety state, unspecified 08/01/2010  . Arthritis   . Congestive heart failure, unspecified 08/01/2010  . Cough 08/07/2015  . Disorders of bursae and tendons in shoulder region, unspecified 08/01/2010  . Edema 08/01/2010  . Fall     . Fecal impaction (HCC)   . GERD (gastroesophageal reflux disease)   . Hiatal hernia   . Hypercalcemia   . Hyperlipidemia   . Hypertension   . Hypertrophy of prostate without urinary obstruction and other lower urinary tract symptoms (LUTS) 08/01/2010  . Hyponatremia   . Hypothyroidism   . Infectious colitis   . Insomnia, unspecified 08/01/2010  . Joint pain   . Leg pain    with walking  . Lumbago 12/24/2010  . Orthostatic hypotension 11/05/2010  . Other specified cardiac dysrhythmias(427.89) 12/24/2010  . Pain in joint, pelvic region and thigh 08/06/2010  . Peptic ulcer, unspecified site, unspecified as acute or chronic, without mention of hemorrhage, perforation, or obstruction 08/01/2010  . Renal failure, acute (HCC)   . Swelling, mass, or lump in head and neck 01/03/2011  . Ulcer (HCC)    Stomach  . Unspecified disorder of kidney and ureter 08/01/2010  . Unspecified hearing loss 08/01/2010  . Unspecified vitamin D deficiency 08/06/2010  . Urinary frequency 09/13/2010  . Vertigo    Past Surgical History:  Procedure Laterality Date  . ABDOMINAL AORTIC ANEURYSM REPAIR  12-18-10   Stent graft repair of AAA  . APPENDECTOMY    . CATARACT EXTRACTION    . HEMORRHOID SURGERY  2000  . INGUINAL HERNIA REPAIR Left 2005  . TRANSTHORACIC ECHOCARDIOGRAM  07/19/2010    Left ventricle: There is hypokinesis of the inferior wall and  posterior wall. The EF is 35-40%. The cavity size was normal    Allergies  Allergen Reactions  .  Aleve [Naproxen]   . Indocin [Indomethacin]       Medication List       Accurate as of 12/13/15  2:26 PM. Always use your most recent med list.          acetaminophen 325 MG tablet Commonly known as:  TYLENOL Take 650 mg by mouth every 6 (six) hours as needed for mild pain, moderate pain or fever.   ARTIFICIAL TEARS 1.4 % ophthalmic solution Generic drug:  polyvinyl alcohol 1 drop. In both eyes twice daily   carbamide peroxide 6.5 % otic  solution Commonly known as:  DEBROX Instill 3-5 Drops into each ear once a week on Saturday and on Sunday at bedtime flush   cholecalciferol 1000 units tablet Commonly known as:  VITAMIN D Take 2,000 Units by mouth daily.   finasteride 5 MG tablet Commonly known as:  PROSCAR Take 1 tablet by mouth daily.   guaiFENesin 100 MG/5ML liquid Commonly known as:  ROBITUSSIN Take 10 CC by mouth every 4 hours as needed for cough or excessive mucus   levothyroxine 50 MCG tablet Commonly known as:  SYNTHROID, LEVOTHROID Take 50 mcg by mouth every other day.   losartan 25 MG tablet Commonly known as:  COZAAR Take 1 tablet by mouth daily.   nystatin-triamcinolone cream Commonly known as:  MYCOLOG II Apply 1 application topically. Apply to head and foot affected areas twice a day until healed   omeprazole 20 MG capsule Commonly known as:  PRILOSEC Take 20 mg by mouth daily.   ORAJEL MT Use as directed in the mouth or throat. Apply to mouth sores with cotton applicatory every 4-6 hours as needed   oxybutynin 5 MG 24 hr tablet Commonly known as:  DITROPAN-XL Take 5 mg by mouth. Take one tablet daily for bladder   oxyCODONE 5 MG immediate release tablet Commonly known as:  ROXICODONE Take one tablet by mouth twice daily as needed for breakthrough pain   oxyCODONE 10 mg 12 hr tablet Commonly known as:  OXYCONTIN Take one tablet by mouth every morning for pain. Do not crush or chew.   polyethylene glycol powder powder Commonly known as:  GLYCOLAX/MIRALAX Take 127.5 g by mouth daily. 17g once a day in liquid for constipation.   SENEXON-S 8.6-50 MG tablet Generic drug:  senna-docusate Take 1 tablet by mouth every other day.       Review of Systems  Constitutional: Negative for fever.       Elderly. Frail.  HENT: Positive for hearing loss.        Xerostomia Dental discomfort right mandible.  Eyes: Negative.  Negative for discharge and redness.  Respiratory: Negative for  cough, chest tightness, shortness of breath and wheezing.        Sternal area discomfort resolved  Cardiovascular: Positive for leg swelling. Negative for chest pain and palpitations.       Bradycardia at 60 bpm.  Gastrointestinal: Negative for abdominal distention, abdominal pain, anal bleeding, blood in stool, constipation, diarrhea, nausea and vomiting.  Endocrine: Negative.   Genitourinary: Positive for frequency.  Musculoskeletal: Positive for arthralgias (chronic shoulder pains), back pain and gait problem. Negative for joint swelling, myalgias, neck pain and neck stiffness.  Skin: Negative.  Negative for rash.  Allergic/Immunologic: Negative.   Neurological: Negative for weakness.       On and off tinglings in the left fingers-can be relieved by touching his forehead with left fingers.   Psychiatric/Behavioral: Negative for agitation, behavioral problems, confusion, hallucinations  and sleep disturbance. The patient is not nervous/anxious and is not hyperactive.     Immunization History  Administered Date(s) Administered  . Influenza-Unspecified 12/27/2012, 12/13/2013, 12/14/2014  . Pneumococcal Polysaccharide-23 03/10/2005  . Td 03/11/1999   Pertinent  Health Maintenance Due  Topic Date Due  . PNA vac Low Risk Adult (2 of 2 - PCV13) 03/10/2006  . INFLUENZA VACCINE  10/09/2015   Fall Risk  08/07/2015 08/07/2015 04/10/2015 03/27/2015  Falls in the past year? Yes No No No   Functional Status Survey:    Vitals:   12/13/15 1416  BP: (!) 144/82  Pulse: 64  Resp: 17  Temp: 98.1 F (36.7 C)  Weight: 150 lb (68 kg)  Height: 5\' 10"  (1.778 m)   Body mass index is 21.52 kg/m. Physical Exam  Constitutional: He is oriented to person, place, and time. He appears well-developed and well-nourished. No distress.  HENT:  Right Ear: External ear normal.  Left Ear: External ear normal.  Nose: Nose normal.  Mouth/Throat: Oropharynx is clear and moist. No oropharyngeal exudate.  Severe  bilateral hearing loss  Eyes: Conjunctivae and EOM are normal. Pupils are equal, round, and reactive to light.  Neck: No JVD present. No tracheal deviation present. No thyromegaly present.  Cardiovascular: Regular rhythm and intact distal pulses.  Exam reveals no gallop and no friction rub.   No murmur heard. Bradycardia at 60 bpm. 3/6 systolic ejection murmur at aortic area.  Pulmonary/Chest: No respiratory distress. He has no wheezes. He has no rales. He exhibits no tenderness.  Abdominal: He exhibits no distension and no mass. There is no tenderness.  Musculoskeletal: He exhibits edema (1+ bilaterally in lower legs and feet). He exhibits no tenderness.  Tender in the low back with percussion. Unstable when standing. Rides in a wheelchair.   Lymphadenopathy:    He has no cervical adenopathy.  Neurological: He is alert and oriented to person, place, and time. He has normal reflexes. No cranial nerve deficit. Coordination normal.  Skin: No rash noted. No erythema. No pallor.  Psychiatric: He has a normal mood and affect. His behavior is normal. Judgment and thought content normal.    Labs reviewed:  Recent Labs  04/26/15 06/25/15  NA 137 137  K 4.6 4.8  BUN 34* 35*  CREATININE 1.4* 1.4*    Recent Labs  04/26/15 06/25/15  AST 18 17  ALT 8* 8*  ALKPHOS 107 107    Recent Labs  04/26/15 06/25/15 11/05/15  WBC 7.3 6.2 6.1  HGB 12.1* 11.2* 11.9*  HCT 37* 34* 36*  PLT 174 185 186   Lab Results  Component Value Date   TSH 3.70 06/25/2015   No results found for: HGBA1C Lab Results  Component Value Date   CHOL 169 06/25/2015   HDL 51 06/25/2015   LDLCALC 102 06/25/2015   TRIG 82 06/25/2015    Significant Diagnostic Results in last 30 days:  No results found.  Assessment/Plan 1. Essential hypertension controlled  2. Hypothyroidism, unspecified type compensated  3. Iron deficiency anemia, unspecified iron deficiency anemia type stable  4. Chronic kidney  disease, stage III (moderate) stable

## 2016-01-06 ENCOUNTER — Emergency Department (HOSPITAL_COMMUNITY): Payer: Medicare PPO

## 2016-01-06 ENCOUNTER — Encounter (HOSPITAL_COMMUNITY): Payer: Self-pay | Admitting: Emergency Medicine

## 2016-01-06 ENCOUNTER — Inpatient Hospital Stay (HOSPITAL_COMMUNITY)
Admission: EM | Admit: 2016-01-06 | Discharge: 2016-01-09 | DRG: 871 | Disposition: A | Discharge status: Home Health | Payer: Medicare PPO | Attending: Family Medicine | Admitting: Family Medicine

## 2016-01-06 DIAGNOSIS — J189 Pneumonia, unspecified organism: Secondary | ICD-10-CM

## 2016-01-06 DIAGNOSIS — H9190 Unspecified hearing loss, unspecified ear: Secondary | ICD-10-CM | POA: Diagnosis present

## 2016-01-06 DIAGNOSIS — I5022 Chronic systolic (congestive) heart failure: Secondary | ICD-10-CM | POA: Diagnosis present

## 2016-01-06 DIAGNOSIS — N183 Chronic kidney disease, stage 3 unspecified: Secondary | ICD-10-CM | POA: Diagnosis present

## 2016-01-06 DIAGNOSIS — E785 Hyperlipidemia, unspecified: Secondary | ICD-10-CM | POA: Diagnosis present

## 2016-01-06 DIAGNOSIS — Y95 Nosocomial condition: Secondary | ICD-10-CM | POA: Diagnosis present

## 2016-01-06 DIAGNOSIS — D638 Anemia in other chronic diseases classified elsewhere: Secondary | ICD-10-CM | POA: Diagnosis present

## 2016-01-06 DIAGNOSIS — E860 Dehydration: Secondary | ICD-10-CM | POA: Diagnosis present

## 2016-01-06 DIAGNOSIS — R509 Fever, unspecified: Secondary | ICD-10-CM | POA: Diagnosis not present

## 2016-01-06 DIAGNOSIS — Z974 Presence of external hearing-aid: Secondary | ICD-10-CM

## 2016-01-06 DIAGNOSIS — I130 Hypertensive heart and chronic kidney disease with heart failure and stage 1 through stage 4 chronic kidney disease, or unspecified chronic kidney disease: Secondary | ICD-10-CM | POA: Diagnosis present

## 2016-01-06 DIAGNOSIS — Z888 Allergy status to other drugs, medicaments and biological substances status: Secondary | ICD-10-CM

## 2016-01-06 DIAGNOSIS — R748 Abnormal levels of other serum enzymes: Secondary | ICD-10-CM | POA: Diagnosis not present

## 2016-01-06 DIAGNOSIS — Z79899 Other long term (current) drug therapy: Secondary | ICD-10-CM

## 2016-01-06 DIAGNOSIS — Z79891 Long term (current) use of opiate analgesic: Secondary | ICD-10-CM

## 2016-01-06 DIAGNOSIS — Z66 Do not resuscitate: Secondary | ICD-10-CM | POA: Diagnosis not present

## 2016-01-06 DIAGNOSIS — K219 Gastro-esophageal reflux disease without esophagitis: Secondary | ICD-10-CM | POA: Diagnosis present

## 2016-01-06 DIAGNOSIS — J9601 Acute respiratory failure with hypoxia: Secondary | ICD-10-CM | POA: Diagnosis present

## 2016-01-06 DIAGNOSIS — A419 Sepsis, unspecified organism: Principal | ICD-10-CM | POA: Diagnosis present

## 2016-01-06 DIAGNOSIS — N400 Enlarged prostate without lower urinary tract symptoms: Secondary | ICD-10-CM | POA: Diagnosis present

## 2016-01-06 DIAGNOSIS — E039 Hypothyroidism, unspecified: Secondary | ICD-10-CM | POA: Diagnosis present

## 2016-01-06 DIAGNOSIS — I10 Essential (primary) hypertension: Secondary | ICD-10-CM | POA: Diagnosis present

## 2016-01-06 DIAGNOSIS — R7989 Other specified abnormal findings of blood chemistry: Secondary | ICD-10-CM

## 2016-01-06 DIAGNOSIS — I495 Sick sinus syndrome: Secondary | ICD-10-CM | POA: Diagnosis present

## 2016-01-06 DIAGNOSIS — J1000 Influenza due to other identified influenza virus with unspecified type of pneumonia: Secondary | ICD-10-CM | POA: Diagnosis present

## 2016-01-06 DIAGNOSIS — R0989 Other specified symptoms and signs involving the circulatory and respiratory systems: Secondary | ICD-10-CM | POA: Diagnosis not present

## 2016-01-06 DIAGNOSIS — R778 Other specified abnormalities of plasma proteins: Secondary | ICD-10-CM | POA: Diagnosis present

## 2016-01-06 LAB — BASIC METABOLIC PANEL
ANION GAP: 7 (ref 5–15)
BUN: 35 mg/dL — AB (ref 6–20)
CALCIUM: 8.3 mg/dL — AB (ref 8.9–10.3)
CO2: 24 mmol/L (ref 22–32)
CREATININE: 1.37 mg/dL — AB (ref 0.61–1.24)
Chloride: 105 mmol/L (ref 101–111)
GFR calc Af Amer: 47 mL/min — ABNORMAL LOW (ref >60)
GFR, EST NON AFRICAN AMERICAN: 41 mL/min — AB (ref >60)
GLUCOSE: 115 mg/dL — AB (ref 65–99)
Potassium: 4.8 mmol/L (ref 3.5–5.1)
Sodium: 136 mmol/L (ref 135–145)

## 2016-01-06 LAB — URINALYSIS, ROUTINE W REFLEX MICROSCOPIC
BILIRUBIN URINE: NEGATIVE
GLUCOSE, UA: NEGATIVE mg/dL
KETONES UR: NEGATIVE mg/dL
Leukocytes, UA: NEGATIVE
Nitrite: NEGATIVE
PROTEIN: 100 mg/dL — AB
Specific Gravity, Urine: 1.016 (ref 1.005–1.030)
pH: 6 (ref 5.0–8.0)

## 2016-01-06 LAB — I-STAT TROPONIN, ED: Troponin i, poc: 0.3 ng/mL (ref 0.00–0.08)

## 2016-01-06 LAB — CBC
HCT: 33.2 % — ABNORMAL LOW (ref 39.0–52.0)
HEMOGLOBIN: 10.9 g/dL — AB (ref 13.0–17.0)
MCH: 28.7 pg (ref 26.0–34.0)
MCHC: 32.8 g/dL (ref 30.0–36.0)
MCV: 87.4 fL (ref 78.0–100.0)
PLATELETS: 168 10*3/uL (ref 150–400)
RBC: 3.8 MIL/uL — ABNORMAL LOW (ref 4.22–5.81)
RDW: 13.9 % (ref 11.5–15.5)
WBC: 8.2 10*3/uL (ref 4.0–10.5)

## 2016-01-06 LAB — URINE MICROSCOPIC-ADD ON

## 2016-01-06 LAB — BRAIN NATRIURETIC PEPTIDE: B NATRIURETIC PEPTIDE 5: 285.2 pg/mL — AB (ref 0.0–100.0)

## 2016-01-06 LAB — I-STAT CG4 LACTIC ACID, ED
LACTIC ACID, VENOUS: 1.04 mmol/L (ref 0.5–1.9)
Lactic Acid, Venous: 0.56 mmol/L (ref 0.5–1.9)

## 2016-01-06 LAB — PROCALCITONIN: Procalcitonin: <0.1 ng/mL

## 2016-01-06 MED ORDER — OXYCODONE HCL 5 MG PO TABS: 5.0000 mg | ORAL_TABLET | Freq: Two times a day (BID) | ORAL | Status: DC | PRN

## 2016-01-06 MED ORDER — DEXTROSE 5 % IV SOLN
2.0000 g | Freq: Once | INTRAVENOUS | Status: AC
  Administered 2016-01-06: 2 g via INTRAVENOUS
  Filled 2016-01-06: qty 2

## 2016-01-06 MED ORDER — ACETAMINOPHEN 325 MG PO TABS
650.0000 mg | ORAL_TABLET | Freq: Once | ORAL | Status: AC
  Administered 2016-01-06: 650 mg via ORAL
  Filled 2016-01-06: qty 2

## 2016-01-06 MED ORDER — LEVOTHYROXINE SODIUM 50 MCG PO TABS
50.0000 ug | ORAL_TABLET | Freq: Every day | ORAL | Status: DC
  Administered 2016-01-07 – 2016-01-09 (×3): 50 ug via ORAL
  Filled 2016-01-06 (×3): qty 1

## 2016-01-06 MED ORDER — GUAIFENESIN 100 MG/5ML PO SOLN: 200.0000 mg | ORAL | Status: DC | PRN

## 2016-01-06 MED ORDER — PANTOPRAZOLE SODIUM 40 MG PO TBEC
40.0000 mg | DELAYED_RELEASE_TABLET | Freq: Every day | ORAL | Status: DC
  Administered 2016-01-07 – 2016-01-09 (×3): 40 mg via ORAL
  Filled 2016-01-06 (×3): qty 1

## 2016-01-06 MED ORDER — LOSARTAN POTASSIUM 25 MG PO TABS
25.0000 mg | ORAL_TABLET | Freq: Every day | ORAL | Status: DC
  Administered 2016-01-07 – 2016-01-09 (×2): 25 mg via ORAL

## 2016-01-06 MED ORDER — ENOXAPARIN SODIUM 30 MG/0.3ML ~~LOC~~ SOLN
30.0000 mg | Freq: Every day | SUBCUTANEOUS | Status: DC
  Administered 2016-01-07 – 2016-01-09 (×3): 30 mg via SUBCUTANEOUS
  Filled 2016-01-06 (×3): qty 0.3

## 2016-01-06 MED ORDER — IPRATROPIUM-ALBUTEROL 0.5-2.5 (3) MG/3ML IN SOLN: 3.0000 mL | RESPIRATORY_TRACT | Status: DC | PRN

## 2016-01-06 MED ORDER — ACETAMINOPHEN 325 MG PO TABS
650.0000 mg | ORAL_TABLET | Freq: Four times a day (QID) | ORAL | Status: DC | PRN
  Administered 2016-01-08: 650 mg via ORAL
  Filled 2016-01-06: qty 2

## 2016-01-06 MED ORDER — OXYBUTYNIN CHLORIDE ER 5 MG PO TB24
5.0000 mg | ORAL_TABLET | Freq: Every day | ORAL | Status: DC
  Administered 2016-01-07 – 2016-01-09 (×3): 5 mg via ORAL
  Filled 2016-01-06 (×3): qty 1

## 2016-01-06 MED ORDER — OXYCODONE HCL ER 10 MG PO T12A
10.0000 mg | EXTENDED_RELEASE_TABLET | Freq: Two times a day (BID) | ORAL | Status: DC
  Administered 2016-01-07 – 2016-01-09 (×6): 10 mg via ORAL
  Filled 2016-01-06 (×6): qty 1

## 2016-01-06 MED ORDER — SENNOSIDES-DOCUSATE SODIUM 8.6-50 MG PO TABS
1.0000 | ORAL_TABLET | ORAL | Status: DC
  Administered 2016-01-07 – 2016-01-09 (×2): 1 via ORAL
  Filled 2016-01-06 (×2): qty 1

## 2016-01-06 MED ORDER — VANCOMYCIN HCL IN DEXTROSE 750-5 MG/150ML-% IV SOLN: 750.0000 mg | INTRAVENOUS | Status: DC

## 2016-01-06 MED ORDER — SODIUM CHLORIDE 0.9 % IV SOLN
INTRAVENOUS | Status: DC
  Administered 2016-01-07 – 2016-01-08 (×2): via INTRAVENOUS

## 2016-01-06 MED ORDER — POLYVINYL ALCOHOL 1.4 % OP SOLN
1.0000 [drp] | Freq: Two times a day (BID) | OPHTHALMIC | Status: DC
  Administered 2016-01-07 – 2016-01-09 (×5): 1 [drp] via OPHTHALMIC
  Filled 2016-01-06: qty 15

## 2016-01-06 MED ORDER — DEXTROSE 5 % IV SOLN
1.0000 g | INTRAVENOUS | Status: DC
  Administered 2016-01-07 – 2016-01-08 (×2): 1 g via INTRAVENOUS
  Filled 2016-01-06 (×3): qty 1

## 2016-01-06 MED ORDER — FINASTERIDE 5 MG PO TABS
5.0000 mg | ORAL_TABLET | Freq: Every day | ORAL | Status: DC
  Administered 2016-01-07 – 2016-01-09 (×3): 5 mg via ORAL
  Filled 2016-01-06 (×3): qty 1

## 2016-01-06 MED ORDER — POLYETHYLENE GLYCOL 3350 17 G PO PACK
1.0000 | PACK | Freq: Every day | ORAL | Status: DC
  Administered 2016-01-07 – 2016-01-09 (×3): 17 g via ORAL
  Filled 2016-01-06 (×3): qty 1

## 2016-01-06 MED ORDER — VANCOMYCIN HCL IN DEXTROSE 1-5 GM/200ML-% IV SOLN
1000.0000 mg | Freq: Once | INTRAVENOUS | Status: AC
  Administered 2016-01-06: 1000 mg via INTRAVENOUS
  Filled 2016-01-06: qty 200

## 2016-01-06 NOTE — ED Notes (Signed)
Karren BurlyEric Rorrer, pt's grandson can be contacted with updates [phone]

## 2016-01-06 NOTE — ED Notes (Signed)
Bed: EA54WA05 Expected date:  Expected time:  Means of arrival:  Comments: [age] yo fever, not feeling well

## 2016-01-06 NOTE — ED Triage Notes (Signed)
[facility] EMS [facility]  Staff at the facility report that pt has been complaining more recently about general issues, cough, and lower back pain and has not been as active as he normally is.  Pt has had no recent fall but nearly fell out of a chair (staff caught him and there was no LOC/dizziness reported) earlier this week.  Pt is at baseline neuro status and is HOH.  Pt has one inch skin tear on left forearm which was bandaged by EMS. VS: 149/80, 87 bpm (irreg with some 1st deg HB on EKG in EMS), 25 RR, no CBG, 88% ox, temp 104F

## 2016-01-06 NOTE — H&P (Addendum)
History and Physical    Ricardo GanserHerbert W Henton RUE:[id number] DOB: 05-Jan-1914 DOA: 01/06/2016  Referring MD/NP/PA: Dr. Rolland PorterMark James PCP: Murray HodgkinsArthur Green, MD  Patient coming from: [facility] assisted living  Chief Complaint: Fever and shortness of breath   HPI: Ricardo GanserHerbert W [name] is a [age] y.o. male with medical history significant of HTN, HLD, hypothyroidism, CHF, anemia, chronic opioid use, ; who presents with complaints of fever and shortness of breath. Much of patient history is obtained via report as the patient's very hard of hearing. Patient was noted to have difficulty breathing and had been noted to have a fever up to 102F at the skilled nursing facility. He also has been dealing with a chronic cough but not normally require oxygen. At baseline the patient is know to ambulate with assistance of walker/cane. Patient apparently almost nearly fell out of a chair complaining of dizziness earlier in the week, but was caught by a nursing home staff member. It is unclear if the patient had any other contributing factors to the ED to his acute presentation. Patient most recent blood work showed hemoglobin of 11.9,  ED Course: Upon admission to the emergency department patient was seen to be febrile up to 103.54F, pulse 68 - 91, respirations 13-24, O2 saturations 88% on room air, and blood pressure maintained. Lab work revealed WBC 8.2, hemoglobin 10.9, platelets 168, sodium 136, potassium 4.8, chloride 105, CO2 24, BUN 35, creatinine 1.37, calcium 8.3, glucose 115, lactic acid 1.04. Chest x-ray showing basilar congestion with bilateral airspace opacities concerning for pneumonia. Patient was placed on nasal cannula oxygen with improvement of O2 saturations greater than 92%. Then started on empiric antibiotics of cefepime and vancomycin in the ED 50 and living a long care facility.  Review of Systems: Unable to fully obtain due to patient being hard of hearing and question possible dementia.  Past Medical  History:  Diagnosis Date  . AAA (abdominal aortic aneurysm) (HCC)   . Abnormality of gait 08/01/2010  . Adhesive capsulitis of shoulder 08/01/2010   "Frozen" right shoulder  . Anemia   . Anxiety state, unspecified 08/01/2010  . Arthritis   . Congestive heart failure, unspecified 08/01/2010  . Cough 08/07/2015  . Disorders of bursae and tendons in shoulder region, unspecified 08/01/2010  . Edema 08/01/2010  . Fall   . Fecal impaction (HCC)   . GERD (gastroesophageal reflux disease)   . Hiatal hernia   . Hypercalcemia   . Hyperlipidemia   . Hypertension   . Hypertrophy of prostate without urinary obstruction and other lower urinary tract symptoms (LUTS) 08/01/2010  . Hyponatremia   . Hypothyroidism   . Infectious colitis   . Insomnia, unspecified 08/01/2010  . Joint pain   . Leg pain    with walking  . Lumbago 12/24/2010  . Orthostatic hypotension 11/05/2010  . Other specified cardiac dysrhythmias(427.89) 12/24/2010  . Pain in joint, pelvic region and thigh 08/06/2010  . Peptic ulcer, unspecified site, unspecified as acute or chronic, without mention of hemorrhage, perforation, or obstruction 08/01/2010  . Renal failure, acute (HCC)   . Swelling, mass, or lump in head and neck 01/03/2011  . Ulcer (HCC)    Stomach  . Unspecified disorder of kidney and ureter 08/01/2010  . Unspecified hearing loss 08/01/2010  . Unspecified vitamin D deficiency 08/06/2010  . Urinary frequency 09/13/2010  . Vertigo     Past Surgical History:  Procedure Laterality Date  . ABDOMINAL AORTIC ANEURYSM REPAIR  12-18-10   Stent graft repair  of AAA  . APPENDECTOMY    . CATARACT EXTRACTION    . HEMORRHOID SURGERY  2000  . INGUINAL HERNIA REPAIR Left 2005  . TRANSTHORACIC ECHOCARDIOGRAM  07/19/2010    Left ventricle: There is hypokinesis of the inferior wall and  posterior wall. The EF is 35-40%. The cavity size was normal     reports that he has never smoked. He has never used smokeless tobacco. He reports  that he drinks alcohol. He reports that he does not use drugs.  Allergies  Allergen Reactions  . Aleve [Naproxen]   . Indocin [Indomethacin]     Family History  Problem Relation Age of Onset  . Heart failure Brother     Prior to Admission medications   Medication Sig Start Date End Date Taking? Authorizing Provider  acetaminophen (TYLENOL) 325 MG tablet Take 650 mg by mouth every 6 (six) hours as needed for mild pain, moderate pain or fever.   Yes Historical Provider, MD  carbamide peroxide (DEBROX) 6.5 % otic solution Instill 3-5 Drops into each ear once a week on Saturday and on Sunday at bedtime flush   Yes Historical Provider, MD  cholecalciferol (VITAMIN D) 1000 UNITS tablet Take 2,000 Units by mouth daily.    Yes Historical Provider, MD  finasteride (PROSCAR) 5 MG tablet Take 5 mg by mouth daily.  01/25/13  Yes Historical Provider, MD  guaiFENesin (ROBITUSSIN) 100 MG/5ML liquid Take 10 CC by mouth every 4 hours as needed for cough or excessive mucus   Yes Historical Provider, MD  levothyroxine (SYNTHROID, LEVOTHROID) 50 MCG tablet Take 50 mcg by mouth daily before breakfast.  01/10/13  Yes Historical Provider, MD  losartan (COZAAR) 25 MG tablet Take 25 mg by mouth daily.  12/31/12  Yes Historical Provider, MD  omeprazole (PRILOSEC) 20 MG capsule Take 20 mg by mouth daily.   Yes Historical Provider, MD  oxybutynin (DITROPAN-XL) 5 MG 24 hr tablet Take 5 mg by mouth daily.    Yes Historical Provider, MD  oxyCODONE (OXYCONTIN) 10 mg 12 hr tablet Take one tablet by mouth every morning for pain. Do not crush or chew. 11/07/15  Yes [name], MD  oxyCODONE (ROXICODONE) 5 MG immediate release tablet Take one tablet by mouth twice daily as needed for breakthrough pain Patient taking differently: Take 5 mg by mouth 2 (two) times daily as needed for severe pain or breakthrough pain. Take one tablet by mouth twice daily as needed for breakthrough pain 07/23/15  Yes Tiffany L Reed, DO    polyethylene glycol powder (GLYCOLAX/MIRALAX) powder Take 127.5 g by mouth daily. 17g once a day in liquid for constipation. 01/27/14  Yes Tiffany L Reed, DO  polyvinyl alcohol (ARTIFICIAL TEARS) 1.4 % ophthalmic solution Place 1 drop into both eyes 2 (two) times daily.    Yes Historical Provider, MD  PRESCRIPTION MEDICATION 5-10 mLs by Mouth Rinse route every 4 (four) hours. Qdryl/[place]/ Lido mouth wash- pt to Rinse then spit.   Yes Historical Provider, MD  senna-docusate (SENEXON-S) 8.6-50 MG per tablet Take 1 tablet by mouth every other day. And patient may take 1 tablet my mouth once daily as needed for constipation   Yes Historical Provider, MD    Physical Exam:    Constitutional: Elderly male who appears to be in mild respiratory distress, able to follow simple commands.  Vitals:   01/06/16 1924 01/06/16 1936 01/06/16 2012 01/06/16 2150  BP: 155/85   159/74  Pulse: 91   68  Resp: 24   13  Temp: (!) 103.2 F (39.6 C)  (!) 103.2 F (39.6 C) 100 F (37.8 C)  TempSrc: Rectal   Oral  SpO2: (!) 88% (!) 88%  97%  Weight:  68 kg (149 lb 14.6 oz)     Eyes: PERRL, lids and conjunctivae normal ENMT: Mucous membranes are dry. Hard of hearing with hearing aids removed. Posterior pharynx clear of any exudate or lesions. edentulous Neck: normal, supple, no masses, no thyromegaly Respiratory:   Decreased overall air movement with crackles, expiratory wheeze, and possible rhonchi heard in the lower lung bases. Currently on 2 L nasal cannula oxygen. Cardiovascular: Regular rate and rhythm, no murmurs / rubs / gallops. Trace extremity edema. 2+ pedal pulses. No carotid bruits.  Abdomen: no tenderness, no masses palpated. No hepatosplenomegaly. Bowel sounds positive.  Musculoskeletal: no clubbing / cyanosis. No joint deformity upper and lower extremities. Good ROM, no contractures. Normal muscle tone.  Skin:Skin tear to the left arm now currently bandaged. Poor overall skin turgor. Neurologic: CN  2-12 grossly intact. Sensation intact, DTR normal. Strength 4+/5 in all 4.  Psychiatric: Cannot fully assess judgment and insight at this time. Alert and oriented x person. Normal mood.     Labs on Admission: I have personally reviewed following labs and imaging studies  CBC:  Recent Labs Lab 01/06/16 1935  WBC 8.2  HGB 10.9*  HCT 33.2*  MCV 87.4  PLT 168   Basic Metabolic Panel:  Recent Labs Lab 01/06/16 1935  NA 136  K 4.8  CL 105  CO2 24  GLUCOSE 115*  BUN 35*  CREATININE 1.37*  CALCIUM 8.3*   GFR: Estimated Creatinine Clearance: 27.6 mL/min (by C-G formula based on SCr of 1.37 mg/dL (H)). Liver Function Tests: No results for input(s): AST, ALT, ALKPHOS, BILITOT, PROT, ALBUMIN in the last 168 hours. No results for input(s): LIPASE, AMYLASE in the last 168 hours. No results for input(s): AMMONIA in the last 168 hours. Coagulation Profile: No results for input(s): INR, PROTIME in the last 168 hours. Cardiac Enzymes: No results for input(s): CKTOTAL, CKMB, CKMBINDEX, TROPONINI in the last 168 hours. BNP (last 3 results) No results for input(s): PROBNP in the last 8760 hours. HbA1C: No results for input(s): HGBA1C in the last 72 hours. CBG: No results for input(s): GLUCAP in the last 168 hours. Lipid Profile: No results for input(s): CHOL, HDL, LDLCALC, TRIG, CHOLHDL, LDLDIRECT in the last 72 hours. Thyroid Function Tests: No results for input(s): TSH, T4TOTAL, FREET4, T3FREE, THYROIDAB in the last 72 hours. Anemia Panel: No results for input(s): VITAMINB12, FOLATE, FERRITIN, TIBC, IRON, RETICCTPCT in the last 72 hours. Urine analysis:    Component Value Date/Time   COLORURINE YELLOW 01/06/2016 2012   APPEARANCEUR CLEAR 01/06/2016 2012   LABSPEC 1.016 01/06/2016 2012   PHURINE 6.0 01/06/2016 2012   GLUCOSEU NEGATIVE 01/06/2016 2012   HGBUR MODERATE (A) 01/06/2016 2012   BILIRUBINUR NEGATIVE 01/06/2016 2012   KETONESUR NEGATIVE 01/06/2016 2012    PROTEINUR 100 (A) 01/06/2016 2012   UROBILINOGEN 1.0 12/13/2010 1129   NITRITE NEGATIVE 01/06/2016 2012   LEUKOCYTESUR NEGATIVE 01/06/2016 2012   Sepsis Labs: No results found for this or any previous visit (from the past 240 hour(s)).   Radiological Exams on Admission: Dg Chest Port 1 View  Result Date: 01/06/2016 CLINICAL DATA:  Code sepsis. Fever. Altered level of consciousness. Initial encounter. EXAM: PORTABLE CHEST 1 VIEW COMPARISON:  Chest radiograph performed 12/18/2010 FINDINGS: The lungs are well-aerated. Vascular  congestion is noted. Bibasilar airspace opacities raise concern for pneumonia, given clinical concern. There is no evidence of pleural effusion or pneumothorax. The cardiomediastinal silhouette is within normal limits. No acute osseous abnormalities are seen. Degenerative change is noted at the glenohumeral joints, with some degree of bony remodeling on the left. IMPRESSION: Vascular congestion noted. Bibasilar airspace opacities raise concern for pneumonia, given clinical concern. Electronically Signed   By: Roanna Raider M.D.   On: 01/06/2016 20:41    EKG: Independently reviewed by myself. Sinus bradycardia with prolonged PR interval and LBBB  Assessment/Plan Sepsis 2/2 HCAP: Acute. Patient with reports of fever and cough. Patient with Fever of 103.74F, tachypneic, and x-ray showing vascular congestion and possible pneumonia. - Admit to a telemetry bed - Sepsis protocol initiated with modified fluid - Follow-up i-STAT troponin, EKG, and BNP - IVF 75 ml/ hr  - Follow-up blood and sputum cultures - Continue empiric antibiotics vancomycin and cefepime - incentive spirometry - DuoNebs   - Tylenol prn fever - continue Mucinex Liquid prn congestion  Acute respiratory failure with hypoxia: On admission patient's O2 saturations on room air 88% - Continuous pulse oximetry with nasal cannula oxygen to keep O2 sats greater than 92% - As seen above  Addendum  Elevated  troponin: Troponin 0.3 on admission. Suspect that this could be demand ischemia EKG showing no significant ST-T wave changes. - Trend troponins  - I want further investigation if continues to trend up  CKD stage III: Baseline creatinine appears to have been around 1.4 for review previous admissions. Creatinine 1.37 which appears near baseline, but BUN elevated at 35. Question if this just some aspect of dehydration. - Continue to monitor  Essential hypertension - Continue losartan  Anemia of chronic disease: Baseline hemoglobin appears to be from 10-12. Per review of records from [facility] and it appears that his hemoglobin was 11.9 in 10/2015. - Recheck CBC in a.m.  Hypothyroidism - Check TSH - Continue levothyroxine  History of CHF: Patient appears to be clinically dry. - Strict ins and outs  Chronic opioid use - Continue OxyContin per for home regimne - Oxycodone IR  Prn breakthrough pain  - Continue MiraLAX and senna  BPH - Continue finasteride  GERD -  pharmacy substitution of Protonix for omeprazole Omeprazole  DVT prophylaxis: Lovenox Code Status: FULL (as seen nursing facility records) Family Communication:  Family not present at bedside  Disposition Plan:  Likely discharge home to assisted living facility when medically stable Consults called: None Admission status: Observation telemetry   Clydie Braun MD [facility] Pager [phone]  If 7PM-7AM, please contact night-coverage [email or web address] Password [email or web address]  01/06/2016, 10:36 PM

## 2016-01-06 NOTE — Progress Notes (Signed)
Pharmacy Antibiotic Note  Ricardo GanserHerbert W [name] is a [age] y.o. male admitted on 01/06/2016 with PNA/sepsis.  Pharmacy has been consulted for Vanc/Cefepime dosing.  Plan: Vancomycin 1g x 1 then 750mg  IV every 24 hours.  Goal trough 15-20 mcg/mL. The dose of cefepime will be adjusted to 2g x 1 then 1g q24 based on renal function.  Weight: 149 lb 14.6 oz (68 kg)  Temp (24hrs), Avg:103.2 F (39.6 C), Min:103.2 F (39.6 C), Max:103.2 F (39.6 C)   Recent Labs Lab 01/06/16 1935 01/06/16 2003  WBC 8.2  --   CREATININE 1.37*  --   LATICACIDVEN  --  1.04    Estimated Creatinine Clearance: 27.6 mL/min (by C-G formula based on SCr of 1.37 mg/dL (H)).    Allergies  Allergen Reactions  . Aleve [Naproxen]   . Indocin [Indomethacin]     Thank you for allowing pharmacy to be a part of this patient's care.   Hessie KnowsJustin M Leman Martinek, PharmD, BCPS Pager [phone] 01/06/2016 8:55 PM

## 2016-01-06 NOTE — ED Notes (Signed)
EKG given to Hospitalist,Rondell,MD. For review.

## 2016-01-06 NOTE — ED Provider Notes (Addendum)
[facility]-EMERGENCY DEPT Provider Note   CSN: [id number] Arrival date & time: 01/06/16  1907     History   Chief Complaint Chief Complaint  Patient presents with  . Fever    HPI AIDENN [name] is a 82 y.o. male. Presents with a complaint of dyspnea and fever from his long-term care facility[facility].  Also complained of back pain and cough.  Patient is very hard of hearing.  Patient with extensive past medical history. Hypertension, hyperlipidemia, hypothyroidism, no aortic aneurysm,  HPI  Past Medical History:  Diagnosis Date  . AAA (abdominal aortic aneurysm) (HCC)   . Abnormality of gait 08/01/2010  . Adhesive capsulitis of shoulder 08/01/2010   "Frozen" right shoulder  . Anemia   . Anxiety state, unspecified 08/01/2010  . Arthritis   . Congestive heart failure, unspecified 08/01/2010  . Cough 08/07/2015  . Disorders of bursae and tendons in shoulder region, unspecified 08/01/2010  . Edema 08/01/2010  . Fall   . Fecal impaction (HCC)   . GERD (gastroesophageal reflux disease)   . Hiatal hernia   . Hypercalcemia   . Hyperlipidemia   . Hypertension   . Hypertrophy of prostate without urinary obstruction and other lower urinary tract symptoms (LUTS) 08/01/2010  . Hyponatremia   . Hypothyroidism   . Infectious colitis   . Insomnia, unspecified 08/01/2010  . Joint pain   . Leg pain    with walking  . Lumbago 12/24/2010  . Orthostatic hypotension 11/05/2010  . Other specified cardiac dysrhythmias(427.89) 12/24/2010  . Pain in joint, pelvic region and thigh 08/06/2010  . Peptic ulcer, unspecified site, unspecified as acute or chronic, without mention of hemorrhage, perforation, or obstruction 08/01/2010  . Renal failure, acute (HCC)   . Swelling, mass, or lump in head and neck 01/03/2011  . Ulcer (HCC)    Stomach  . Unspecified disorder of kidney and ureter 08/01/2010  . Unspecified hearing loss 08/01/2010  . Unspecified vitamin D deficiency  08/06/2010  . Urinary frequency 09/13/2010  . Vertigo     Patient Active Problem List   Diagnosis Date Noted  . Sepsis (HCC) 01/06/2016  . HCAP (healthcare-associated pneumonia) 01/06/2016  . Cough 08/07/2015  . Chronic mucus hypersecretion, respiratory 04/10/2015  . Xerostomia 03/27/2015  . Edema 03/27/2015  . Bradycardia by electrocardiogram 09/22/2014  . Spinal stenosis in cervical region 09/06/2013  . Deaf 06/21/2013  . Lumbago 06/21/2013  . Constipation 03/08/2013  . Hypothyroidism 03/08/2013  . BPH (benign prostatic hyperplasia) 03/08/2013  . Depression with anxiety 03/08/2013  . Fall at nursing home 03/08/2013  . AAA (abdominal aortic aneurysm) (HCC) 01/27/2012  . Abnormality of gait 08/01/2010  . Osteoarthritis 05/17/2009  . GERD 03/16/2009  . ULCER-GASTRIC 03/16/2009  . DIVERTICULOSIS-COLON 03/16/2009  . Dyslipidemia 01/30/2009  . Iron deficiency anemia 01/30/2009  . Essential hypertension 01/30/2009  . VERTIGO 01/30/2009  . Chronic kidney disease, stage III (moderate) 01/30/2009    Past Surgical History:  Procedure Laterality Date  . ABDOMINAL AORTIC ANEURYSM REPAIR  12-18-10   Stent graft repair of AAA  . APPENDECTOMY    . CATARACT EXTRACTION    . HEMORRHOID SURGERY  2000  . INGUINAL HERNIA REPAIR Left 2005  . TRANSTHORACIC ECHOCARDIOGRAM  07/19/2010    Left ventricle: There is hypokinesis of the inferior wall and  posterior wall. The EF is 35-40%. The cavity size was normal       Home Medications    Prior to Admission medications   Medication Sig  Start Date End Date Taking? Authorizing Provider  acetaminophen (TYLENOL) 325 MG tablet Take 650 mg by mouth every 6 (six) hours as needed for mild pain, moderate pain or fever.   Yes Historical Provider, MD  carbamide peroxide (DEBROX) 6.5 % otic solution Instill 3-5 Drops into each ear once a week on Saturday and on Sunday at bedtime flush   Yes Historical Provider, MD  cholecalciferol (VITAMIN D) 1000 UNITS  tablet Take 2,000 Units by mouth daily.    Yes Historical Provider, MD  finasteride (PROSCAR) 5 MG tablet Take 5 mg by mouth daily.  01/25/13  Yes Historical Provider, MD  guaiFENesin (ROBITUSSIN) 100 MG/5ML liquid Take 10 CC by mouth every 4 hours as needed for cough or excessive mucus   Yes Historical Provider, MD  levothyroxine (SYNTHROID, LEVOTHROID) 50 MCG tablet Take 50 mcg by mouth daily before breakfast.  01/10/13  Yes Historical Provider, MD  losartan (COZAAR) 25 MG tablet Take 25 mg by mouth daily.  12/31/12  Yes Historical Provider, MD  omeprazole (PRILOSEC) 20 MG capsule Take 20 mg by mouth daily.   Yes Historical Provider, MD  oxybutynin (DITROPAN-XL) 5 MG 24 hr tablet Take 5 mg by mouth daily.    Yes Historical Provider, MD  oxyCODONE (OXYCONTIN) 10 mg 12 hr tablet Take one tablet by mouth every morning for pain. Do not crush or chew. 11/07/15  Yes [name], MD  oxyCODONE (ROXICODONE) 5 MG immediate release tablet Take one tablet by mouth twice daily as needed for breakthrough pain Patient taking differently: Take 5 mg by mouth 2 (two) times daily as needed for severe pain or breakthrough pain. Take one tablet by mouth twice daily as needed for breakthrough pain 07/23/15  Yes Tiffany L Reed, DO  polyethylene glycol powder (GLYCOLAX/MIRALAX) powder Take 127.5 g by mouth daily. 17g once a day in liquid for constipation. 01/27/14  Yes Tiffany L Reed, DO  polyvinyl alcohol (ARTIFICIAL TEARS) 1.4 % ophthalmic solution Place 1 drop into both eyes 2 (two) times daily.    Yes Historical Provider, MD  PRESCRIPTION MEDICATION 5-10 mLs by Mouth Rinse route every 4 (four) hours. Qdryl/Alma/ Lido mouth wash- pt to Rinse then spit.   Yes Historical Provider, MD  senna-docusate (SENEXON-S) 8.6-50 MG per tablet Take 1 tablet by mouth every other day. And patient may take 1 tablet my mouth once daily as needed for constipation   Yes Historical Provider, MD    Family History Family History  Problem  Relation Age of Onset  . Heart failure Brother     Social History Social History  Substance Use Topics  . Smoking status: Never Smoker  . Smokeless tobacco: Never Used  . Alcohol use 0.0 oz/week     Comment: 1 1/2 oz daily      Allergies   Aleve [naproxen] and Indocin [indomethacin]   Review of Systems Review of Systems  Constitutional: Negative for appetite change, chills, diaphoresis, fatigue and fever.  HENT: Negative for mouth sores, sore throat and trouble swallowing.   Eyes: Negative for visual disturbance.  Respiratory: Positive for cough and shortness of breath. Negative for chest tightness and wheezing.   Cardiovascular: Negative for chest pain.  Gastrointestinal: Negative for abdominal distention, abdominal pain, diarrhea, nausea and vomiting.  Endocrine: Negative for polydipsia, polyphagia and polyuria.  Genitourinary: Negative for dysuria, frequency and hematuria.  Musculoskeletal: Positive for back pain. Negative for gait problem.  Skin: Negative for color change, pallor and rash.  Neurological: Negative for  dizziness, syncope, light-headedness and headaches.  Hematological: Does not bruise/bleed easily.  Psychiatric/Behavioral: Negative for behavioral problems and confusion.     Physical Exam Updated Vital Signs BP 136/62   Pulse 60   Temp 100 F (37.8 C) (Oral)   Resp 18   Wt 149 lb 14.6 oz (68 kg)   SpO2 96%   BMI 21.51 kg/m   Physical Exam  Constitutional: He is oriented to person, place, and time. He appears well-developed. No distress.  Thin. Awake and alert. VERY hard of hearing.  HENT:  Head: Normocephalic.  Eyes: Conjunctivae are normal. Pupils are equal, round, and reactive to light. No scleral icterus.  Neck: Normal range of motion. Neck supple. No thyromegaly present.  Cardiovascular: Normal rate and regular rhythm.  Exam reveals no gallop and no friction rub.   No murmur heard. Pulmonary/Chest: Effort normal. No respiratory distress.  He has no wheezes. He has no rales.  Bi basilar crackles.  Abdominal: Soft. Bowel sounds are normal. He exhibits no distension. There is no tenderness. There is no rebound.  Musculoskeletal: Normal range of motion.  Neurological: He is alert and oriented to person, place, and time.  Skin: Skin is warm and dry. No rash noted.  Psychiatric: He has a normal mood and affect. His behavior is normal.     ED Treatments / Results  Labs (all labs ordered are listed, but only abnormal results are displayed) Labs Reviewed  BASIC METABOLIC PANEL - Abnormal; Notable for the following:       Result Value   Glucose, Bld 115 (*)    BUN 35 (*)    Creatinine, Ser 1.37 (*)    Calcium 8.3 (*)    GFR calc non Af Amer 41 (*)    GFR calc Af Amer 47 (*)    All other components within normal limits  CBC - Abnormal; Notable for the following:    RBC 3.80 (*)    Hemoglobin 10.9 (*)    HCT 33.2 (*)    All other components within normal limits  URINALYSIS, ROUTINE W REFLEX MICROSCOPIC (NOT AT [facility]) - Abnormal; Notable for the following:    Hgb urine dipstick MODERATE (*)    Protein, ur 100 (*)    All other components within normal limits  URINE MICROSCOPIC-ADD ON - Abnormal; Notable for the following:    Squamous Epithelial / LPF 0-5 (*)    Bacteria, UA RARE (*)    All other components within normal limits  CULTURE, BLOOD (ROUTINE X 2)  CULTURE, BLOOD (ROUTINE X 2)  URINE CULTURE  BRAIN NATRIURETIC PEPTIDE  PROCALCITONIN  TSH  I-STAT CG4 LACTIC ACID, ED  I-STAT CG4 LACTIC ACID, ED  Rosezena SensorI-STAT TROPOININ, ED    EKG  EKG Interpretation None       Radiology Dg Chest Port 1 View  Result Date: 01/06/2016 CLINICAL DATA:  Code sepsis. Fever. Altered level of consciousness. Initial encounter. EXAM: PORTABLE CHEST 1 VIEW COMPARISON:  Chest radiograph performed 12/18/2010 FINDINGS: The lungs are well-aerated. Vascular congestion is noted. Bibasilar airspace opacities raise concern for pneumonia, given  clinical concern. There is no evidence of pleural effusion or pneumothorax. The cardiomediastinal silhouette is within normal limits. No acute osseous abnormalities are seen. Degenerative change is noted at the glenohumeral joints, with some degree of bony remodeling on the left. IMPRESSION: Vascular congestion noted. Bibasilar airspace opacities raise concern for pneumonia, given clinical concern. Electronically Signed   By: Roanna RaiderJeffery  Chang M.D.   On: 01/06/2016 20:41  Procedures Procedures (including critical care time)  Medications Ordered in ED Medications  ceFEPIme (MAXIPIME) 1 g in dextrose 5 % 50 mL IVPB (not administered)  vancomycin (VANCOCIN) IVPB 750 mg/150 ml premix (not administered)  ceFEPIme (MAXIPIME) 2 g in dextrose 5 % 50 mL IVPB (0 g Intravenous Stopped 01/06/16 2203)  acetaminophen (TYLENOL) tablet 650 mg (650 mg Oral Given 01/06/16 2014)  vancomycin (VANCOCIN) IVPB 1000 mg/200 mL premix (0 mg Intravenous Stopped 01/06/16 2203)     Initial Impression / Assessment and Plan / ED Course  I have reviewed the triage vital signs and the nursing notes.  Pertinent labs & imaging results that were available during my care of the patient were reviewed by me and considered in my medical decision making (see chart for details).  Clinical Course    Chest x-ray shows bibasilar infiltrates. Suggestion of vascular congestion as well. Troponin and BNP pending. Given IV vancomycin and Maxipime. Discussed with Dr. Katrinka Blazing. He will be admitted. He remains well oxygenated on 2 L.  Final Clinical Impressions(s) / ED Diagnoses   Final diagnoses:  HCAP (healthcare-associated pneumonia)    New Prescriptions Current Discharge Medication List       Rolland Porter, MD 01/06/16 2258    Rolland Porter, MD 02/23/16 [phone]

## 2016-01-06 NOTE — ED Notes (Signed)
Hospitalist, EDP, and RN made aware of critical troponin

## 2016-01-06 NOTE — ED Notes (Signed)
BLOOD CULTURES WERE TAKEN FROM [facility] AND [facility] BEFORE ANTIBIOTICS WERE STARTED.  CLICKED OFF LATE IN EPIC.

## 2016-01-07 DIAGNOSIS — Z66 Do not resuscitate: Secondary | ICD-10-CM | POA: Diagnosis not present

## 2016-01-07 DIAGNOSIS — A419 Sepsis, unspecified organism: Secondary | ICD-10-CM | POA: Diagnosis not present

## 2016-01-07 DIAGNOSIS — I10 Essential (primary) hypertension: Secondary | ICD-10-CM | POA: Diagnosis not present

## 2016-01-07 DIAGNOSIS — Z79891 Long term (current) use of opiate analgesic: Secondary | ICD-10-CM | POA: Diagnosis not present

## 2016-01-07 DIAGNOSIS — Y95 Nosocomial condition: Secondary | ICD-10-CM | POA: Diagnosis present

## 2016-01-07 DIAGNOSIS — Z888 Allergy status to other drugs, medicaments and biological substances status: Secondary | ICD-10-CM | POA: Diagnosis not present

## 2016-01-07 DIAGNOSIS — E785 Hyperlipidemia, unspecified: Secondary | ICD-10-CM | POA: Diagnosis present

## 2016-01-07 DIAGNOSIS — I130 Hypertensive heart and chronic kidney disease with heart failure and stage 1 through stage 4 chronic kidney disease, or unspecified chronic kidney disease: Secondary | ICD-10-CM | POA: Diagnosis not present

## 2016-01-07 DIAGNOSIS — I5022 Chronic systolic (congestive) heart failure: Secondary | ICD-10-CM | POA: Diagnosis not present

## 2016-01-07 DIAGNOSIS — R079 Chest pain, unspecified: Secondary | ICD-10-CM | POA: Diagnosis not present

## 2016-01-07 DIAGNOSIS — H9190 Unspecified hearing loss, unspecified ear: Secondary | ICD-10-CM | POA: Diagnosis present

## 2016-01-07 DIAGNOSIS — J9601 Acute respiratory failure with hypoxia: Secondary | ICD-10-CM | POA: Diagnosis not present

## 2016-01-07 DIAGNOSIS — R748 Abnormal levels of other serum enzymes: Secondary | ICD-10-CM | POA: Diagnosis not present

## 2016-01-07 DIAGNOSIS — N183 Chronic kidney disease, stage 3 (moderate): Secondary | ICD-10-CM | POA: Diagnosis not present

## 2016-01-07 DIAGNOSIS — E039 Hypothyroidism, unspecified: Secondary | ICD-10-CM | POA: Diagnosis present

## 2016-01-07 DIAGNOSIS — N400 Enlarged prostate without lower urinary tract symptoms: Secondary | ICD-10-CM | POA: Diagnosis present

## 2016-01-07 DIAGNOSIS — I495 Sick sinus syndrome: Secondary | ICD-10-CM | POA: Diagnosis not present

## 2016-01-07 DIAGNOSIS — J129 Viral pneumonia, unspecified: Secondary | ICD-10-CM | POA: Diagnosis not present

## 2016-01-07 DIAGNOSIS — D638 Anemia in other chronic diseases classified elsewhere: Secondary | ICD-10-CM | POA: Diagnosis present

## 2016-01-07 DIAGNOSIS — Z974 Presence of external hearing-aid: Secondary | ICD-10-CM | POA: Diagnosis not present

## 2016-01-07 DIAGNOSIS — E860 Dehydration: Secondary | ICD-10-CM | POA: Diagnosis not present

## 2016-01-07 DIAGNOSIS — K219 Gastro-esophageal reflux disease without esophagitis: Secondary | ICD-10-CM | POA: Diagnosis present

## 2016-01-07 DIAGNOSIS — J1000 Influenza due to other identified influenza virus with unspecified type of pneumonia: Secondary | ICD-10-CM | POA: Diagnosis not present

## 2016-01-07 DIAGNOSIS — Z79899 Other long term (current) drug therapy: Secondary | ICD-10-CM | POA: Diagnosis not present

## 2016-01-07 DIAGNOSIS — R509 Fever, unspecified: Secondary | ICD-10-CM | POA: Diagnosis present

## 2016-01-07 LAB — MRSA PCR SCREENING: MRSA BY PCR: POSITIVE — AB

## 2016-01-07 LAB — RESPIRATORY PANEL BY PCR
Adenovirus: NOT DETECTED
BORDETELLA PERTUSSIS-RVPCR: NOT DETECTED
CHLAMYDOPHILA PNEUMONIAE-RVPPCR: NOT DETECTED
CORONAVIRUS HKU1-RVPPCR: NOT DETECTED
Coronavirus 229E: NOT DETECTED
Coronavirus NL63: NOT DETECTED
Coronavirus OC43: NOT DETECTED
Influenza A: NOT DETECTED
Influenza B: NOT DETECTED
Metapneumovirus: NOT DETECTED
Mycoplasma pneumoniae: NOT DETECTED
PARAINFLUENZA VIRUS 3-RVPPCR: NOT DETECTED
Parainfluenza Virus 1: DETECTED — AB
Parainfluenza Virus 2: NOT DETECTED
Parainfluenza Virus 4: NOT DETECTED
RHINOVIRUS / ENTEROVIRUS - RVPPCR: NOT DETECTED
Respiratory Syncytial Virus: NOT DETECTED

## 2016-01-07 LAB — STREP PNEUMONIAE URINARY ANTIGEN: Strep Pneumo Urinary Antigen: NEGATIVE

## 2016-01-07 LAB — BASIC METABOLIC PANEL
Anion gap: 5 (ref 5–15)
BUN: 32 mg/dL — AB (ref 6–20)
CALCIUM: 8.1 mg/dL — AB (ref 8.9–10.3)
CO2: 25 mmol/L (ref 22–32)
CREATININE: 1.2 mg/dL (ref 0.61–1.24)
Chloride: 106 mmol/L (ref 101–111)
GFR calc Af Amer: 55 mL/min — ABNORMAL LOW (ref >60)
GFR calc non Af Amer: 48 mL/min — ABNORMAL LOW (ref >60)
GLUCOSE: 101 mg/dL — AB (ref 65–99)
Potassium: 4.1 mmol/L (ref 3.5–5.1)
Sodium: 136 mmol/L (ref 135–145)

## 2016-01-07 LAB — CBC
HCT: 28.8 % — ABNORMAL LOW (ref 39.0–52.0)
HEMOGLOBIN: 9.4 g/dL — AB (ref 13.0–17.0)
MCH: 28.7 pg (ref 26.0–34.0)
MCHC: 32.6 g/dL (ref 30.0–36.0)
MCV: 87.8 fL (ref 78.0–100.0)
PLATELETS: 133 10*3/uL — AB (ref 150–400)
RBC: 3.28 MIL/uL — ABNORMAL LOW (ref 4.22–5.81)
RDW: 14 % (ref 11.5–15.5)
WBC: 6.5 10*3/uL (ref 4.0–10.5)

## 2016-01-07 LAB — TROPONIN I
TROPONIN I: 0.48 ng/mL — AB (ref <0.03)
TROPONIN I: 0.5 ng/mL — AB (ref <0.03)

## 2016-01-07 LAB — INFLUENZA PANEL BY PCR (TYPE A & B)
H1N1 flu by pcr: NOT DETECTED
INFLAPCR: NEGATIVE
INFLBPCR: NEGATIVE

## 2016-01-07 LAB — TSH: TSH: 1.011 u[IU]/mL (ref 0.350–4.500)

## 2016-01-07 MED ORDER — VANCOMYCIN HCL IN DEXTROSE 1-5 GM/200ML-% IV SOLN
1000.0000 mg | INTRAVENOUS | Status: DC
  Administered 2016-01-07 – 2016-01-08 (×2): 1000 mg via INTRAVENOUS
  Filled 2016-01-07 (×2): qty 200

## 2016-01-07 NOTE — Progress Notes (Signed)
On call notified of patient maintaining a heart rate in the upper 30's. Other VS are WNL. No chest pain. Patient appears to be sleeping deeply when heart rate gets into 30's. Will continue to monitor.

## 2016-01-07 NOTE — Care Management Obs Status (Signed)
MEDICARE OBSERVATION STATUS NOTIFICATION   Patient Details  Name: Ricardo GanserHerbert W Cedotal MRN: [id number] Date of Birth: Apr 13, 1913   Medicare Observation Status Notification Given:  Yes    MahabirOlegario Messier, Treveon Bourcier, RN 01/07/2016, 12:56 PM

## 2016-01-07 NOTE — Progress Notes (Signed)
CRITICAL VALUE ALERT  Critical value received:  Troponin 0.48 MRSA +  Date of notification:  01/07/2016   Time of notification:  0535 0536   Critical value read back:Yes.    Nurse who received alert:  Carlean JewsAshley Elmus Mathes, RN  MD notified (1st page):  Schoor  Time of first page:  [phone]  MD notified (2nd page):  Time of second page:  Responding MD:  Schoor  Time MD responded:  [phone]

## 2016-01-07 NOTE — Progress Notes (Addendum)
PROGRESS NOTE    Ricardo [name]  ZOX:[id number] DOB: [birth year] DOA: 01/06/2016 PCP: Murray HodgkinsArthur Green, MD    Brief Narrative:  [age] y.o. male with medical history significant of HTN, HLD, hypothyroidism, CHF, anemia, chronic opioid use, ; who presents with complaints of fever and shortness of breath. Much of patient history is obtained via report as the patient's very hard of hearing. Patient was noted to have difficulty breathing and had been noted to have a fever up to 102F at the skilled nursing facility. He also has been dealing with a chronic cough but not normally require oxygen. At baseline the patient is know to ambulate with assistance of walker/cane. Patient apparently almost nearly fell out of a chair complaining of dizziness earlier in the week, but was caught by a nursing home staff member. It is unclear if the patient had any other contributing factors to the ED to his acute presentation. Patient most recent blood work showed hemoglobin of 11.9,  ED Course: Upon admission to the emergency department patient was seen to be febrile up to 103.44F, pulse 68 - 91, respirations 13-24, O2 saturations 88% on room air, and blood pressure maintained. Lab work revealed WBC 8.2, hemoglobin 10.9, platelets 168, sodium 136, potassium 4.8, chloride 105, CO2 24, BUN 35, creatinine 1.37, calcium 8.3, glucose 115, lactic acid 1.04. Chest x-ray showing basilar congestion with bilateral airspace opacities concerning for pneumonia. Patient was placed on nasal cannula oxygen with improvement of O2 saturations greater than 92%. Then started on empiric antibiotics of cefepime and vancomycin in the ED 52 and living a long care facility.  Assessment & Plan:   Principal Problem:   Sepsis (HCC) Active Problems:   Essential hypertension   GERD   Chronic kidney disease, stage III (moderate)   Hypothyroidism   HCAP (healthcare-associated pneumonia)   Elevated troponin  Acute respiratory failure with hypoxia  secondary to parainfluenza upper infection with possible secondary healthcare associated pneumonia:  -Patient presented hypoxic with O2 sats of 88% on room air -Patient currently on 4 L nasal cannula at 100% O2 sat duration -Continue to wean O2 as tolerated -Patient is continued on empiric vancomycin and cefepime  CKD stage III:  -Baseline creatinine noted to be around 1.4 -Creatinine currently 1.2 -Patient is continued on IV fluid hydration -Repeat basic metabolic panel morning  Essential hypertension - Blood pressure currently stable -Tolerating losartan, will continue  Anemia of chronic disease:  -Baseline hemoglobin appears to be from 10-12.  -Hemoglobin presently stable at 9.4 with no signs of active bleeding at this time - We'll repeat CBC in the morning  Hypothyroidism - TSH reviewed, within normal limits - We'll continue current levothyroxine dose as tolerated  History of CHF:  -Unclear EF. No prior 2-D echocardiogram documented  -Patient presented clinically dehydrated -Continue IV fluids as tolerated  Chronic opioid use -Patient is continued on OxyContin per home regimen -Patient is continued on oxycodone IR for breakthrough pain -We'll need to continue bowel regimen, currently with MiraLAX and senna  BPH -Patient is continued with finasteride -Continue to monitor for now  GERD -   will continue proton pump inhibitor as tolerated  Elevated troponin - Troponin noted to be borderline elevated at time of admission at 0.3. -Troponin is gradually rising, currently with a peak of 0.5 -Patient is without chest pain or other symptoms -EKG reviewed, no ischemic changes apparent -Discussed case with cardiologist on call who recommends 2-D echocardiogram otherwise, suspicion for demand mismatch secondary to presenting hypoxia. Cardiology  does not recommend further workup at this time including troponins. -We'll order 2-D echocardiogram  DVT prophylaxis: Lovenox  subcutaneous Code Status: Full code Family Communication: Patient in room, patient's grandson at bedside Disposition Plan: Uncertain at this time  Consultants:   Discussed case with cardiology over phone  Procedures:     Antimicrobials: Anti-infectives    Start     Dose/Rate Route Frequency Ordered Stop   01/07/16 2100  vancomycin (VANCOCIN) IVPB 750 mg/150 ml premix  Status:  Discontinued     750 mg 150 mL/hr over 60 Minutes Intravenous Every 24 hours 01/06/16 2057 01/07/16 0736   01/07/16 2100  vancomycin (VANCOCIN) IVPB 1000 mg/200 mL premix     1,000 mg 200 mL/hr over 60 Minutes Intravenous Every 24 hours 01/07/16 0736     01/07/16 2000  ceFEPIme (MAXIPIME) 1 g in dextrose 5 % 50 mL IVPB     1 g 100 mL/hr over 30 Minutes Intravenous Every 24 hours 01/06/16 2057     01/06/16 1945  ceFEPIme (MAXIPIME) 2 g in dextrose 5 % 50 mL IVPB     2 g 100 mL/hr over 30 Minutes Intravenous  Once 01/06/16 1935 01/06/16 2203   01/06/16 1945  vancomycin (VANCOCIN) IVPB 1000 mg/200 mL premix     1,000 mg 200 mL/hr over 60 Minutes Intravenous  Once 01/06/16 1944 01/06/16 2203       Subjective: No complaints at present  Objective: Vitals:   01/06/16 2343 01/07/16 0039 01/07/16 0546 01/07/16 1350  BP: (!) 139/54 (!) 160/73 132/73 (!) 167/71  Pulse: (!) 57 64 (!) 48 (!) 50  Resp: 18 17 18 18   Temp: 98.7 F (37.1 C) 98 F (36.7 C) 98.5 F (36.9 C) 97.6 F (36.4 C)  TempSrc: Oral Axillary Oral Axillary  SpO2: 98% 96% 100% 100%  Weight:  66.9 kg (147 lb 7.8 oz)    Height:  5\' 9"  (1.753 m)      Intake/Output Summary (Last 24 hours) at 01/07/16 1524 Last data filed at 01/07/16 1100  Gross per 24 hour  Intake           543.75 ml  Output              700 ml  Net          -156.25 ml   Filed Weights   01/06/16 1936 01/07/16 0039  Weight: 68 kg (149 lb 14.6 oz) 66.9 kg (147 lb 7.8 oz)    Examination:  General exam: Appears calm and comfortable  Respiratory system: Clear to  auscultation. Respiratory effort normal. Cardiovascular system: S1 & S2 heard, RRR.  Gastrointestinal system: Abdomen is nondistended, soft and nontender. No organomegaly or masses felt. Normal bowel sounds heard. Central nervous system: Alert and oriented. No focal neurological deficits. Extremities: Symmetric 5 x 5 power. Skin: No rashes, lesions or ulcers Psychiatry: Judgement and insight appear normal. Mood & affect appropriate.   Data Reviewed: I have personally reviewed following labs and imaging studies  CBC:  Recent Labs Lab 01/06/16 1935 01/07/16 0439  WBC 8.2 6.5  HGB 10.9* 9.4*  HCT 33.2* 28.8*  MCV 87.4 87.8  PLT 168 133*   Basic Metabolic Panel:  Recent Labs Lab 01/06/16 1935 01/07/16 0439  NA 136 136  K 4.8 4.1  CL 105 106  CO2 24 25  GLUCOSE 115* 101*  BUN 35* 32*  CREATININE 1.37* 1.20  CALCIUM 8.3* 8.1*   GFR: Estimated Creatinine Clearance: 31 mL/min (by C-G formula  based on SCr of 1.2 mg/dL). Liver Function Tests: No results for input(s): AST, ALT, ALKPHOS, BILITOT, PROT, ALBUMIN in the last 168 hours. No results for input(s): LIPASE, AMYLASE in the last 168 hours. No results for input(s): AMMONIA in the last 168 hours. Coagulation Profile: No results for input(s): INR, PROTIME in the last 168 hours. Cardiac Enzymes:  Recent Labs Lab 01/07/16 0439 01/07/16 1154  TROPONINI 0.48* 0.50*   BNP (last 3 results) No results for input(s): PROBNP in the last 8760 hours. HbA1C: No results for input(s): HGBA1C in the last 72 hours. CBG: No results for input(s): GLUCAP in the last 168 hours. Lipid Profile: No results for input(s): CHOL, HDL, LDLCALC, TRIG, CHOLHDL, LDLDIRECT in the last 72 hours. Thyroid Function Tests:  Recent Labs  01/06/16 2300  TSH 1.011   Anemia Panel: No results for input(s): VITAMINB12, FOLATE, FERRITIN, TIBC, IRON, RETICCTPCT in the last 72 hours. Sepsis Labs:  Recent Labs Lab 01/06/16 1950 01/06/16 2003  01/06/16 2250  PROCALCITON <0.10  --   --   LATICACIDVEN  --  1.04 0.56    Recent Results (from the past 240 hour(s))  Respiratory Panel by PCR     Status: Abnormal   Collection Time: 01/07/16 12:30 AM  Result Value Ref Range Status   Adenovirus NOT DETECTED NOT DETECTED Final   Coronavirus 229E NOT DETECTED NOT DETECTED Final   Coronavirus HKU1 NOT DETECTED NOT DETECTED Final   Coronavirus NL63 NOT DETECTED NOT DETECTED Final   Coronavirus OC43 NOT DETECTED NOT DETECTED Final   Metapneumovirus NOT DETECTED NOT DETECTED Final   Rhinovirus / Enterovirus NOT DETECTED NOT DETECTED Final   Influenza A NOT DETECTED NOT DETECTED Final   Influenza B NOT DETECTED NOT DETECTED Final   Parainfluenza Virus 1 DETECTED (A) NOT DETECTED Final   Parainfluenza Virus 2 NOT DETECTED NOT DETECTED Final   Parainfluenza Virus 3 NOT DETECTED NOT DETECTED Final   Parainfluenza Virus 4 NOT DETECTED NOT DETECTED Final   Respiratory Syncytial Virus NOT DETECTED NOT DETECTED Final   Bordetella pertussis NOT DETECTED NOT DETECTED Final   Chlamydophila pneumoniae NOT DETECTED NOT DETECTED Final   Mycoplasma pneumoniae NOT DETECTED NOT DETECTED Final    Comment: Performed at [facility]  MRSA PCR Screening     Status: Abnormal   Collection Time: 01/07/16 12:33 AM  Result Value Ref Range Status   MRSA by PCR POSITIVE (A) NEGATIVE Final    Comment:        The GeneXpert MRSA Assay (FDA approved for NASAL specimens only), is one component of a comprehensive MRSA colonization surveillance program. It is not intended to diagnose MRSA infection nor to guide or monitor treatment for MRSA infections. RESULT CALLED TO, READ BACK BY AND VERIFIED WITH: ARCKARDS,A RN 10.30.17 @0531  ZANDO,C      Radiology Studies: Dg Chest Port 1 View  Result Date: 01/06/2016 CLINICAL DATA:  Code sepsis. Fever. Altered level of consciousness. Initial encounter. EXAM: PORTABLE CHEST 1 VIEW COMPARISON:  Chest radiograph  performed 12/18/2010 FINDINGS: The lungs are well-aerated. Vascular congestion is noted. Bibasilar airspace opacities raise concern for pneumonia, given clinical concern. There is no evidence of pleural effusion or pneumothorax. The cardiomediastinal silhouette is within normal limits. No acute osseous abnormalities are seen. Degenerative change is noted at the glenohumeral joints, with some degree of bony remodeling on the left. IMPRESSION: Vascular congestion noted. Bibasilar airspace opacities raise concern for pneumonia, given clinical concern. Electronically Signed   By: Leotis Shames  Chang M.D.   On: 01/06/2016 20:41    Scheduled Meds: . ceFEPime (MAXIPIME) IV  1 g Intravenous Q24H  . enoxaparin (LOVENOX) injection  30 mg Subcutaneous Daily  . finasteride  5 mg Oral Daily  . levothyroxine  50 mcg Oral QAC breakfast  . losartan  25 mg Oral Daily  . oxybutynin  5 mg Oral Daily  . oxyCODONE  10 mg Oral Q12H  . pantoprazole  40 mg Oral Daily  . polyethylene glycol  1 packet Oral Daily  . polyvinyl alcohol  1 drop Both Eyes BID  . senna-docusate  1 tablet Oral QODAY  . vancomycin  1,000 mg Intravenous Q24H   Continuous Infusions: . sodium chloride 75 mL/hr at 01/07/16 0039     LOS: 0 days   CHIU, Scheryl MartenSTEPHEN K, MD [facility] Pager [phone]  If 7PM-7AM, please contact night-coverage [email or web address] Password [email or web address] 01/07/2016, 3:24 PM

## 2016-01-07 NOTE — Plan of Care (Signed)
Problem: Safety: Goal: Ability to remain free from injury will improve Outcome: Completed/Met Date Met: 01/07/16 Bed alarm set. Discussed safety prevention plan    

## 2016-01-07 NOTE — Progress Notes (Signed)
Pharmacy Antibiotic Note  Ricardo GanserHerbert W [name] is a [age] y.o. male admitted on 01/06/2016 with PNA/sepsis.  Pharmacy has been consulted for Vanc/Cefepime dosing.  Plan:  Increase Vancomycin to 1g IV q24h for improved CrCl of 32 ml/min  Check trough at steady state; goal 15-20 mcg/ml  Continue cefepime 1g IV q24h  Follow up renal function & cultures, de-escalate as appropriate  Height: 5\' 9"  (175.3 cm) Weight: 147 lb 7.8 oz (66.9 kg) IBW/kg (Calculated) : 70.7  Temp (24hrs), Avg:100.3 F (37.9 C), Min:98 F (36.7 C), Max:103.2 F (39.6 C)   Recent Labs Lab 01/06/16 1935 01/06/16 2003 01/06/16 2250 01/07/16 0439  WBC 8.2  --   --  6.5  CREATININE 1.37*  --   --  1.20  LATICACIDVEN  --  1.04 0.56  --     Estimated Creatinine Clearance: 31 mL/min (by C-G formula based on SCr of 1.2 mg/dL).    Allergies  Allergen Reactions  . Aleve [Naproxen]   . Indocin [Indomethacin]    Antimicrobials this admission:  10/29 Vancomycin >> 10/29 Cefepime >>  Dose adjustments this admission:  ---  Microbiology results:  10/29 BCx: sent 10/29 UCx: sent  10/29 Sputum: sent  10/30 MRSA PCR: (+) 10/30 Respiratory panel: ordered  Thank you for allowing pharmacy to be a part of this patient's care.   Loralee PacasErin Lauryl Seyer, PharmD, BCPS Pager: [phone] 01/07/2016 7:36 AM

## 2016-01-07 NOTE — Plan of Care (Signed)
Problem: Education: Goal: Knowledge of [place] [facility] Education information/materials will improve Outcome: Completed/Met Date Met: 01/07/16 Discussed plan of care with grandson.

## 2016-01-07 NOTE — Care Management Note (Signed)
Case Management Note  Patient Details  Name: Ricardo GanserHerbert W [name] MRN: [id number] Date of Birth: November 11, 1913  Subjective/Objective:  [age] y/o m admitted w/Sepsis. Hx: HOH.From ALF-[facility]. PT cons-await recc. CSW also following.                 Action/Plan:d/c plan ALF.   Expected Discharge Date:                  Expected Discharge Plan:  Assisted Living / Rest Home  In-House Referral:  Clinical Social Work  Discharge planning Services  CM Consult  Post Acute Care Choice:    Choice offered to:     DME Arranged:    DME Agency:     HH Arranged:    HH Agency:     Status of Service:  In process, will continue to follow  If discussed at Long Length of Stay Meetings, dates discussed:    Additional Comments:  Lanier ClamMahabir, Ricardo Preis, RN 01/07/2016, 10:55 AM

## 2016-01-07 NOTE — Progress Notes (Signed)
Attempted to wean oxygen at 2 lt. Pt was 96% 2lt. on room air saturation decreased 86-87% while pt was asleep and increased to 88% while awake.

## 2016-01-08 ENCOUNTER — Inpatient Hospital Stay (HOSPITAL_COMMUNITY): Payer: Medicare PPO

## 2016-01-08 DIAGNOSIS — R079 Chest pain, unspecified: Secondary | ICD-10-CM

## 2016-01-08 DIAGNOSIS — Z66 Do not resuscitate: Secondary | ICD-10-CM

## 2016-01-08 LAB — BASIC METABOLIC PANEL
Anion gap: 5 (ref 5–15)
BUN: 24 mg/dL — AB (ref 6–20)
CHLORIDE: 105 mmol/L (ref 101–111)
CO2: 24 mmol/L (ref 22–32)
CREATININE: 1.21 mg/dL (ref 0.61–1.24)
Calcium: 8.1 mg/dL — ABNORMAL LOW (ref 8.9–10.3)
GFR calc Af Amer: 55 mL/min — ABNORMAL LOW (ref >60)
GFR, EST NON AFRICAN AMERICAN: 47 mL/min — AB (ref >60)
GLUCOSE: 82 mg/dL (ref 65–99)
POTASSIUM: 4.5 mmol/L (ref 3.5–5.1)
SODIUM: 134 mmol/L — AB (ref 135–145)

## 2016-01-08 LAB — URINE CULTURE

## 2016-01-08 LAB — MAGNESIUM: MAGNESIUM: 1.8 mg/dL (ref 1.7–2.4)

## 2016-01-08 LAB — ECHOCARDIOGRAM COMPLETE
HEIGHTINCHES: 69 in
WEIGHTICAEL: 2359.8 [oz_av]

## 2016-01-08 LAB — PROCALCITONIN

## 2016-01-08 MED ORDER — MAGNESIUM SULFATE 2 GM/50ML IV SOLN
2.0000 g | Freq: Once | INTRAVENOUS | Status: AC
  Administered 2016-01-08: 2 g via INTRAVENOUS
  Filled 2016-01-08: qty 50

## 2016-01-08 MED ORDER — CHLORHEXIDINE GLUCONATE CLOTH 2 % EX PADS
6.0000 | MEDICATED_PAD | Freq: Every day | CUTANEOUS | Status: DC
  Administered 2016-01-08 – 2016-01-09 (×2): 6 via TOPICAL

## 2016-01-08 MED ORDER — MUPIROCIN 2 % EX OINT
1.0000 "application " | TOPICAL_OINTMENT | Freq: Two times a day (BID) | CUTANEOUS | Status: DC
  Administered 2016-01-08 – 2016-01-09 (×3): 1 via NASAL
  Filled 2016-01-08: qty 22

## 2016-01-08 NOTE — Progress Notes (Signed)
Throughout the day patient's HR would go from brady to very tachy and then NSR sporadically. Mid afternoon there was a 2.55 second pause. RN maintained contact with MD regarding HR changes. Patient made DNR and will continue to monitor.   Julio SicksK. Latashia Koch RN

## 2016-01-08 NOTE — Progress Notes (Addendum)
PROGRESS NOTE    KAYLA WEEKES  ZOX:[id number] DOB: Apr 25, 1913 DOA: 01/06/2016 PCP: Murray Hodgkins, MD    Brief Narrative:  82 y.o. male with medical history significant of HTN, HLD, hypothyroidism, CHF, anemia, chronic opioid use, ; who presents with complaints of fever and shortness of breath. Much of patient history is obtained via report as the patient's very hard of hearing. Patient was noted to have difficulty breathing and had been noted to have a fever up to 102F at the skilled nursing facility. He also has been dealing with a chronic cough but not normally require oxygen. At baseline the patient is know to ambulate with assistance of walker/cane. Patient apparently almost nearly fell out of a chair complaining of dizziness earlier in the week, but was caught by a nursing home staff member. It is unclear if the patient had any other contributing factors to the ED to his acute presentation. Patient most recent blood work showed hemoglobin of 11.9,  ED Course: Upon admission to the emergency department patient was seen to be febrile up to 103.67F, pulse 68 - 91, respirations 13-24, O2 saturations 88% on room air, and blood pressure maintained. Lab work revealed WBC 8.2, hemoglobin 10.9, platelets 168, sodium 136, potassium 4.8, chloride 105, CO2 24, BUN 35, creatinine 1.37, calcium 8.3, glucose 115, lactic acid 1.04. Chest x-ray showing basilar congestion with bilateral airspace opacities concerning for pneumonia. Patient was placed on nasal cannula oxygen with improvement of O2 saturations greater than 92%. Then started on empiric antibiotics of cefepime and vancomycin in the ED 50 and living a long care facility.  Assessment & Plan:   Principal Problem:   Sepsis (HCC) Active Problems:   Essential hypertension   GERD   Chronic kidney disease, stage III (moderate)   Hypothyroidism   HCAP (healthcare-associated pneumonia)   Elevated troponin  Acute respiratory failure with hypoxia  secondary to parainfluenza upper infection with possible secondary healthcare associated pneumonia:  -Patient presented with hypoxemia while on room air -O2 weaned to [facility] -Overnight documentation reviewed. Patient noted to desturate to the 80's on room air overnight -Continue on empiric vanc and cefepime for now  CKD stage III:  -Baseline creatinine noted to be around 1.4 -Creatinine stable at 1.2 -patient is continued on gentle IVF hydration -Repeat basic metabolic panel morning  Essential hypertension - Blood pressure remains stable -Patient is thus far tolerating losartan. Will continue  Anemia of chronic disease:  -Baseline hemoglobin appears to be from 10-12.  -Hemoglobin remains stable  Hypothyroidism - TSH reviewed. Remains stable - Plan to continue thyroid replacement  History of chronic systolic CHF:  -2d echo reviewed. EF of 40-45% -Will decrease IVF to 50cc/hr -Patient presented clinically dehydrated on presentation  Chronic opioid use -Patient remains on OxyContin as tolerated -Will continue oxycodone IR for breakthrough -Monitor for bowel function  BPH -continue with finasteride -continue to monitor I/o  GERD -   Plan to continue PPI  Elevated troponin - Troponin noted to be borderline elevated at time of admission at 0.3. -Troponin had gradually risen with peak of 0.5 -Patient remains asymptomatic without chest pains -EKG was reviewed, no ischemic changes apparent -Had earlier discussed case with cardiologist on call who does not recommend further workup at this time, including troponins. -2-D echocardiogram reviewed with EF of 40-45%  Tachy-brady -Patient with tachycardia and periods of bradycardia -Asymptomatic -Labs reviewed. K normal -Will check Mg level and correct as needed  End of Life -discussed case with pt's son who  is POA -patient's son is in agreement with current plan of care. All questions answered -Patient's son requests  no aggressive intervention be done should patient's heart stop or he stops breathing. Patient's son requests DNR status for patient. Orders placed.  DVT prophylaxis: Lovenox subcutaneous Code Status: DNR Family Communication: Patient in room, patient's grandson at bedside Disposition Plan: Uncertain at this time  Consultants:   Discussed case with cardiology over phone  Procedures:     Antimicrobials: Anti-infectives    Start     Dose/Rate Route Frequency Ordered Stop   01/07/16 2100  vancomycin (VANCOCIN) IVPB 750 mg/150 ml premix  Status:  Discontinued     750 mg 150 mL/hr over 60 Minutes Intravenous Every 24 hours 01/06/16 2057 01/07/16 0736   01/07/16 2100  vancomycin (VANCOCIN) IVPB 1000 mg/200 mL premix     1,000 mg 200 mL/hr over 60 Minutes Intravenous Every 24 hours 01/07/16 0736     01/07/16 2000  ceFEPIme (MAXIPIME) 1 g in dextrose 5 % 50 mL IVPB     1 g 100 mL/hr over 30 Minutes Intravenous Every 24 hours 01/06/16 2057     01/06/16 1945  ceFEPIme (MAXIPIME) 2 g in dextrose 5 % 50 mL IVPB     2 g 100 mL/hr over 30 Minutes Intravenous  Once 01/06/16 1935 01/06/16 2203   01/06/16 1945  vancomycin (VANCOCIN) IVPB 1000 mg/200 mL premix     1,000 mg 200 mL/hr over 60 Minutes Intravenous  Once 01/06/16 1944 01/06/16 2203      Subjective: Without complaints  Objective: Vitals:   01/07/16 2116 01/08/16 0454 01/08/16 0850 01/08/16 1407  BP: 130/75 (!) 161/83  125/60  Pulse: (!) 53 63  61  Resp: 16 18  16   Temp: 98 F (36.7 C) 98.2 F (36.8 C)  97.3 F (36.3 C)  TempSrc: Oral Oral  Oral  SpO2: 95% 98% 94% 100%  Weight:      Height:        Intake/Output Summary (Last 24 hours) at 01/08/16 1516 Last data filed at 01/08/16 1406  Gross per 24 hour  Intake          1503.75 ml  Output             2275 ml  Net          -771.25 ml   Filed Weights   01/06/16 1936 01/07/16 0039  Weight: 68 kg (149 lb 14.6 oz) 66.9 kg (147 lb 7.8 oz)    Examination:  General  exam: Laying in bed, in nad Respiratory system: normal resp effort, no audible wheezing Cardiovascular system: S1 & S2 heard, RRR.  Gastrointestinal system: soft, nondistended, decreased BS Central nervous system: CN2-12 grossly intact, strength/sensation intact Extremities: Perfused, no clubbing Skin: no pallor, no rashes Psychiatry: mood appears normal//no visual hallucinations   Data Reviewed: I have personally reviewed following labs and imaging studies  CBC:  Recent Labs Lab 01/06/16 1935 01/07/16 0439  WBC 8.2 6.5  HGB 10.9* 9.4*  HCT 33.2* 28.8*  MCV 87.4 87.8  PLT 168 133*   Basic Metabolic Panel:  Recent Labs Lab 01/06/16 1935 01/07/16 0439 01/08/16 0446  NA 136 136 134*  K 4.8 4.1 4.5  CL 105 106 105  CO2 24 25 24   GLUCOSE 115* 101* 82  BUN 35* 32* 24*  CREATININE 1.37* 1.20 1.21  CALCIUM 8.3* 8.1* 8.1*   GFR: Estimated Creatinine Clearance: 30.7 mL/min (by C-G formula based on SCr of 1.21  mg/dL). Liver Function Tests: No results for input(s): AST, ALT, ALKPHOS, BILITOT, PROT, ALBUMIN in the last 168 hours. No results for input(s): LIPASE, AMYLASE in the last 168 hours. No results for input(s): AMMONIA in the last 168 hours. Coagulation Profile: No results for input(s): INR, PROTIME in the last 168 hours. Cardiac Enzymes:  Recent Labs Lab 01/07/16 0439 01/07/16 1154  TROPONINI 0.48* 0.50*   BNP (last 3 results) No results for input(s): PROBNP in the last 8760 hours. HbA1C: No results for input(s): HGBA1C in the last 72 hours. CBG: No results for input(s): GLUCAP in the last 168 hours. Lipid Profile: No results for input(s): CHOL, HDL, LDLCALC, TRIG, CHOLHDL, LDLDIRECT in the last 72 hours. Thyroid Function Tests:  Recent Labs  01/06/16 2300  TSH 1.011   Anemia Panel: No results for input(s): VITAMINB12, FOLATE, FERRITIN, TIBC, IRON, RETICCTPCT in the last 72 hours. Sepsis Labs:  Recent Labs Lab 01/06/16 1950 01/06/16 2003  01/06/16 2250 01/08/16 0446  PROCALCITON <0.10  --   --  <0.10  LATICACIDVEN  --  1.04 0.56  --     Recent Results (from the past 240 hour(s))  Blood culture (routine x 2)     Status: None (Preliminary result)   Collection Time: 01/06/16  7:35 PM  Result Value Ref Range Status   Specimen Description BLOOD LEFT ANTECUBITAL  Final   Special Requests BOTTLES DRAWN AEROBIC AND ANAEROBIC 5CC  Final   Culture   Final    NO GROWTH 1 DAY Performed at [facility]    Report Status PENDING  Incomplete  Blood culture (routine x 2)     Status: None (Preliminary result)   Collection Time: 01/06/16  7:59 PM  Result Value Ref Range Status   Specimen Description BLOOD RIGHT HAND  Final   Special Requests BOTTLES DRAWN AEROBIC AND ANAEROBIC 5CC  Final   Culture   Final    NO GROWTH 1 DAY Performed at [facility]    Report Status PENDING  Incomplete  Urine culture     Status: Abnormal   Collection Time: 01/06/16  8:12 PM  Result Value Ref Range Status   Specimen Description URINE, CLEAN CATCH  Final   Special Requests NONE  Final   Culture (A)  Final    <10,000 COLONIES/mL INSIGNIFICANT GROWTH Performed at [facility]    Report Status 01/08/2016 FINAL  Final  Respiratory Panel by PCR     Status: Abnormal   Collection Time: 01/07/16 12:30 AM  Result Value Ref Range Status   Adenovirus NOT DETECTED NOT DETECTED Final   Coronavirus 229E NOT DETECTED NOT DETECTED Final   Coronavirus HKU1 NOT DETECTED NOT DETECTED Final   Coronavirus NL63 NOT DETECTED NOT DETECTED Final   Coronavirus OC43 NOT DETECTED NOT DETECTED Final   Metapneumovirus NOT DETECTED NOT DETECTED Final   Rhinovirus / Enterovirus NOT DETECTED NOT DETECTED Final   Influenza A NOT DETECTED NOT DETECTED Final   Influenza B NOT DETECTED NOT DETECTED Final   Parainfluenza Virus 1 DETECTED (A) NOT DETECTED Final   Parainfluenza Virus 2 NOT DETECTED NOT DETECTED Final   Parainfluenza Virus 3 NOT DETECTED  NOT DETECTED Final   Parainfluenza Virus 4 NOT DETECTED NOT DETECTED Final   Respiratory Syncytial Virus NOT DETECTED NOT DETECTED Final   Bordetella pertussis NOT DETECTED NOT DETECTED Final   Chlamydophila pneumoniae NOT DETECTED NOT DETECTED Final   Mycoplasma pneumoniae NOT DETECTED NOT DETECTED Final    Comment:  Performed at [facility]  MRSA PCR Screening     Status: Abnormal   Collection Time: 01/07/16 12:33 AM  Result Value Ref Range Status   MRSA by PCR POSITIVE (A) NEGATIVE Final    Comment:        The GeneXpert MRSA Assay (FDA approved for NASAL specimens only), is one component of a comprehensive MRSA colonization surveillance program. It is not intended to diagnose MRSA infection nor to guide or monitor treatment for MRSA infections. RESULT CALLED TO, READ BACK BY AND VERIFIED WITH: ARCKARDS,A RN 10.30.17 @0531  ZANDO,C      Radiology Studies: Dg Chest Port 1 View  Result Date: 01/06/2016 CLINICAL DATA:  Code sepsis. Fever. Altered level of consciousness. Initial encounter. EXAM: PORTABLE CHEST 1 VIEW COMPARISON:  Chest radiograph performed 12/18/2010 FINDINGS: The lungs are well-aerated. Vascular congestion is noted. Bibasilar airspace opacities raise concern for pneumonia, given clinical concern. There is no evidence of pleural effusion or pneumothorax. The cardiomediastinal silhouette is within normal limits. No acute osseous abnormalities are seen. Degenerative change is noted at the glenohumeral joints, with some degree of bony remodeling on the left. IMPRESSION: Vascular congestion noted. Bibasilar airspace opacities raise concern for pneumonia, given clinical concern. Electronically Signed   By: Roanna Raider M.D.   On: 01/06/2016 20:41    Scheduled Meds: . ceFEPime (MAXIPIME) IV  1 g Intravenous Q24H  . Chlorhexidine Gluconate Cloth  6 each Topical Q0600  . enoxaparin (LOVENOX) injection  30 mg Subcutaneous Daily  . finasteride  5 mg Oral Daily  .  levothyroxine  50 mcg Oral QAC breakfast  . losartan  25 mg Oral Daily  . mupirocin ointment  1 application Nasal BID  . oxybutynin  5 mg Oral Daily  . oxyCODONE  10 mg Oral Q12H  . pantoprazole  40 mg Oral Daily  . polyethylene glycol  1 packet Oral Daily  . polyvinyl alcohol  1 drop Both Eyes BID  . senna-docusate  1 tablet Oral QODAY  . vancomycin  1,000 mg Intravenous Q24H   Continuous Infusions: . sodium chloride 75 mL/hr at 01/08/16 0920     LOS: 1 day   Sherrilynn Gudgel, Scheryl Marten, MD [facility] Pager [phone]  If 7PM-7AM, please contact night-coverage [email or web address] Password [email or web address] 01/08/2016, 3:16 PM

## 2016-01-08 NOTE — NC FL2 (Signed)
  [place] MEDICAID FL2 LEVEL OF CARE SCREENING TOOL     IDENTIFICATION  Patient Name: Ricardo GanserHerbert W Gottsch Birthdate: 04-02-1913 Sex: male Admission Date (Current Location): 01/06/2016  [facility] [place]   Facility and Address:  [facility],  [address], [place] [id number]      Provider Number: [id number]  Attending Physician Name and Address:  Jerald KiefStephen K Chiu, MD  Relative Name and Phone Number:       Current Level of Care: Hospital Recommended Level of Care: Assisted Living Facility Prior Approval Number:    Date Approved/Denied:   PASRR Number: [id number]  Discharge Plan:  (ALF)    Current Diagnoses: Patient Active Problem List   Diagnosis Date Noted  . Sepsis (HCC) 01/06/2016  . HCAP (healthcare-associated pneumonia) 01/06/2016  . Elevated troponin 01/06/2016  . Cough 08/07/2015  . Chronic mucus hypersecretion, respiratory 04/10/2015  . Xerostomia 03/27/2015  . Edema 03/27/2015  . Bradycardia by electrocardiogram 09/22/2014  . Spinal stenosis in cervical region 09/06/2013  . Deaf 06/21/2013  . Lumbago 06/21/2013  . Constipation 03/08/2013  . Hypothyroidism 03/08/2013  . BPH (benign prostatic hyperplasia) 03/08/2013  . Depression with anxiety 03/08/2013  . Fall at nursing home 03/08/2013  . AAA (abdominal aortic aneurysm) (HCC) 01/27/2012  . Abnormality of gait 08/01/2010  . Osteoarthritis 05/17/2009  . GERD 03/16/2009  . ULCER-GASTRIC 03/16/2009  . DIVERTICULOSIS-COLON 03/16/2009  . Dyslipidemia 01/30/2009  . Iron deficiency anemia 01/30/2009  . Essential hypertension 01/30/2009  . VERTIGO 01/30/2009  . Chronic kidney disease, stage III (moderate) 01/30/2009    Orientation RESPIRATION BLADDER Height & Weight     Self, Time, Situation, Place  Normal External catheter, Incontinent Weight: 147 lb 7.8 oz (66.9 kg) Height:  5\' 9"  (175.3 cm)  BEHAVIORAL SYMPTOMS/MOOD NEUROLOGICAL BOWEL NUTRITION STATUS   Continent Diet (Heart)  AMBULATORY STATUS COMMUNICATION OF NEEDS Skin   Limited Assist Verbally Normal                       Personal Care Assistance Level of Assistance  Bathing, Dressing Bathing Assistance: Limited assistance   Dressing Assistance: Limited assistance     Functional Limitations Info  Hearing   Hearing Info: Impaired      SPECIAL CARE FACTORS FREQUENCY  PT (By licensed PT)                    Contractures      Additional Factors Info  Code Status, Allergies Code Status Info: Fullcode Allergies Info: Aleve Naproxen, Indocin Indomethacin            Discharge Medications: Please see discharge summary for a list of discharge medications.  Relevant Imaging Results:  Relevant Lab Results:   Additional Information SSN: [id number]  [name], Salli Bodin F, LCSW

## 2016-01-08 NOTE — Care Management Note (Signed)
Case Management Note  Patient Details  Name: Ezequiel GanserHerbert W Nardo MRN: [id number] Date of Birth: 07/06/1913  Subjective/Objective:  PT-recc HHPT. From [facility]-ALF-they have their own HHPT-[facility] will fax HHPT,[id number] order once placed. CSW managing d/c back to ALF.                  Action/Plan:d/c back to ALF w/HHPT.   Expected Discharge Date:                  Expected Discharge Plan:  Assisted Living / Rest Home  In-House Referral:  Clinical Social Work  Discharge planning Services  CM Consult  Post Acute Care Choice:    Choice offered to:  Patient  DME Arranged:    DME Agency:     HH Arranged:    HH Agency:     Status of Service:  In process, will continue to follow  If discussed at Long Length of Stay Meetings, dates discussed:    Additional Comments:  Lanier ClamMahabir, Josten [place], RN 01/08/2016, 12:57 PM

## 2016-01-08 NOTE — Clinical Social Work Note (Signed)
Clinical Social Work Assessment  Patient Details  Name: Ricardo [name] MRN: [id number] Date of Birth: 04/11/1913  Date of referral:  01/08/16               Reason for consult:  Facility Placement                Permission sought to share information with:  Facility Industrial/product designerContact Representative Permission granted to share information::  Yes, Verbal Permission Granted  Name::        Agency::     Relationship::     Contact Information:     Housing/Transportation Living arrangements for the past 2 months:  Assisted Living Facility Source of Information:  Adult Children Patient Interpreter Needed:  None Criminal Activity/Legal Involvement Pertinent to Current Situation/Hospitalization:  No - Comment as needed Significant Relationships:  Adult Children, Other Family Members Lives with:  Facility Resident Do you feel safe going back to the place where you live?  Yes Need for family participation in patient care:  Yes (Comment)  Care giving concerns:  CSW received consult that [facility]    Social Worker assessment / plan:  CSW reviewed PT evaluation recommending home health at discharge - RNCM, Olegario MessierKathy [facility]   Employment status:  Retired Database administratornsurance information:  Managed Medicare PT Recommendations:  Home with Home Health Information / Referral to community resources:     Patient/Family's Response to care:  Patient's grandson, [name] states that he plans to return to ALF at discharge & that family will provide transportation.   Patient/Family's Understanding of and Emotional Response to Diagnosis, Current Treatment, and Prognosis:    Emotional Assessment Appearance:  Appears stated age Attitude/Demeanor/Rapport:    Affect (typically observed):    Orientation:  Oriented to Self, Oriented to Place, Oriented to  Time, Oriented to Situation Alcohol / Substance use:    Psych involvement (Current and /or in the community):      Discharge Needs  Concerns to be addressed:    Readmission within the last 30 days:    Current discharge risk:    Barriers to Discharge:      [name], [name], LCSW 01/08/2016, 1:28 PM

## 2016-01-08 NOTE — Evaluation (Addendum)
Physical Therapy Evaluation Patient Details Name: Ricardo GanserHerbert W Hanawalt MRN: [id number] DOB: 1914-03-04 Today's Date: 01/08/2016   History of Present Illness  [age] y.o. male with medical history significant of HTN, HLD, hypothyroidism, CHF, anemia, chronic opioid use, ; who presents with complaints of fever and shortness of breath.  Clinical Impression  Pt admitted with above diagnosis. Pt currently with functional limitations due to the deficits listed below (see PT Problem List). Pt will benefit from skilled PT to increase their independence and safety with mobility to allow discharge to the venue listed below.  Pt amb well with min assist for occasional unsteady gait and safety with turns; may benefit from HHPT at ALF; pt may need to limit gait and utilize scooter more until transitioned back to ALF routine/HHPt deems safe for gait     Follow Up Recommendations Home health PT    Equipment Recommendations  None recommended by PT    Recommendations for Other Services       Precautions / Restrictions Precautions Precautions: Fall      Mobility  Bed Mobility Overal bed mobility: Needs Assistance Bed Mobility: Supine to Sit     Supine to sit: Supervision     General bed mobility comments: for safety  Transfers Overall transfer level: Needs assistance Equipment used: Rolling walker (2 wheeled) Transfers: Sit to/from Stand Sit to Stand: Min assist         General transfer comment: subtle cues for hand placement, pt uses correct placement for stand to sit  Ambulation/Gait Ambulation/Gait assistance: Min assist Ambulation Distance (Feet): 90 Feet Assistive device: Rolling walker (2 wheeled) Gait Pattern/deviations: Step-through pattern;Trunk flexed;Drifts right/left     General Gait Details: cues for safety wtih turns  Stairs            Wheelchair Mobility    Modified Rankin (Stroke Patients Only)       Balance Overall balance assessment: Needs  assistance   Sitting balance-Leahy Scale: Fair       Standing balance-Leahy Scale: Poor Standing balance comment: reliant on UEs for safe standing                             Pertinent Vitals/Pain Pain Assessment: No/denies pain    Home Living Family/patient expects to be discharged to:: Assisted living               Home Equipment: Electric scooter (some type of walker, pt grandson unsure of  whether or not it has wheels)      Prior Function Level of Independence: Independent with assistive device(s)         Comments: uses scooter for longer distances, walks shorter distances with walker; pt grandson reports Independence with bathing and dressing(?)     Hand Dominance        Extremity/Trunk Assessment   Upper Extremity Assessment: Generalized weakness           Lower Extremity Assessment: Generalized weakness      Cervical / Trunk Assessment: Kyphotic  Communication   Communication: HOH  Cognition Arousal/Alertness: Awake/alert Behavior During Therapy: WFL for tasks assessed/performed Overall Cognitive Status: Within Functional Limits for tasks assessed                      General Comments      Exercises     Assessment/Plan    PT Assessment Patient needs continued PT services  PT Problem List Decreased strength;Decreased balance;Decreased  activity tolerance          PT Treatment Interventions DME instruction;Gait training;Functional mobility training;Therapeutic activities;Therapeutic exercise;Patient/family education    PT Goals (Current goals can be found in the Care Plan section)  Acute Rehab PT Goals Patient Stated Goal: back to ALF PT Goal Formulation: With family Time For Goal Achievement: 01/15/16    Frequency Min 3X/week   Barriers to discharge        Co-evaluation               End of Session Equipment Utilized During Treatment: Gait belt Activity Tolerance: Patient tolerated treatment  well Patient left: in chair;with call bell/phone within reach;with chair alarm set;with family/visitor present           Time: 1152-1210 PT Time Calculation (min) (ACUTE ONLY): 18 min   Charges:   PT Evaluation $PT Eval Low Complexity: 1 Procedure     PT G Codes:        Dannisha Eckmann 01/08/2016, 1:00 PM

## 2016-01-08 NOTE — Care Management Note (Signed)
Case Management Note  Patient Details  Name: Ezequiel GanserHerbert W [place] MRN: [id number] Date of Birth: 1913-05-13  Subjective/Objective:  Waiting HHPT,[id number] order. Fax to [facility]-fax#[phone]-[facility]-spoke to Dorian-coordinator.                  Action/Plan:d/c plan return ALF-[facility].   Expected Discharge Date:                  Expected Discharge Plan:  Assisted Living / Rest Home  In-House Referral:  Clinical Social Work  Discharge planning Services  CM Consult  Post Acute Care Choice:    Choice offered to:  Patient  DME Arranged:    DME Agency:     HH Arranged:    HH Agency:     Status of Service:  In process, will continue to follow  If discussed at Long Length of Stay Meetings, dates discussed:    Additional Comments:  Lanier ClamMahabir, Elany Felix, RN 01/08/2016, 4:23 PM

## 2016-01-08 NOTE — Progress Notes (Signed)
  Echocardiogram 2D Echocardiogram has been performed.  Nolon RodBrown, Tony 01/08/2016, 11:58 AM

## 2016-01-09 DIAGNOSIS — R748 Abnormal levels of other serum enzymes: Secondary | ICD-10-CM

## 2016-01-09 DIAGNOSIS — N183 Chronic kidney disease, stage 3 (moderate): Secondary | ICD-10-CM

## 2016-01-09 DIAGNOSIS — I10 Essential (primary) hypertension: Secondary | ICD-10-CM

## 2016-01-09 DIAGNOSIS — J129 Viral pneumonia, unspecified: Secondary | ICD-10-CM

## 2016-01-09 DIAGNOSIS — A419 Sepsis, unspecified organism: Principal | ICD-10-CM

## 2016-01-09 LAB — BASIC METABOLIC PANEL
Anion gap: 6 (ref 5–15)
BUN: 26 mg/dL — AB (ref 6–20)
CHLORIDE: 107 mmol/L (ref 101–111)
CO2: 21 mmol/L — ABNORMAL LOW (ref 22–32)
CREATININE: 1.14 mg/dL (ref 0.61–1.24)
Calcium: 7.8 mg/dL — ABNORMAL LOW (ref 8.9–10.3)
GFR calc Af Amer: 59 mL/min — ABNORMAL LOW (ref >60)
GFR calc non Af Amer: 51 mL/min — ABNORMAL LOW (ref >60)
Glucose, Bld: 87 mg/dL (ref 65–99)
Potassium: 4.3 mmol/L (ref 3.5–5.1)
SODIUM: 134 mmol/L — AB (ref 135–145)

## 2016-01-09 LAB — LEGIONELLA PNEUMOPHILA SEROGP 1 UR AG: L. pneumophila Serogp 1 Ur Ag: NEGATIVE

## 2016-01-09 MED ORDER — AMOXICILLIN-POT CLAVULANATE 875-125 MG PO TABS: 1.0000 | ORAL_TABLET | Freq: Two times a day (BID) | ORAL | 0 refills | Status: DC

## 2016-01-09 NOTE — Clinical Social Work Note (Signed)
[facility] once grandson, [name] arrives to transport at [employer]. Facility aware.   Medical Social Worker will sign off for now as social work intervention is no longer needed. Please consult us again if new need arises.  Derenda FennelBashira Adron Geisel, MSW [phone] 01/09/2016 3:13 PM

## 2016-01-09 NOTE — Clinical Social Work Note (Signed)
Awaiting phone call back from RN at [facility] before patient can be released.   Medical Social Worker facilitated patient discharge including contacting patient family and facility to confirm patient discharge plans.  Clinical information faxed to facility and family agreeable with plan. Patient's grandson, [name] to [facility].  RN to call report prior to discharge.  Medical Social Worker will sign off for now as social work intervention is no longer needed. Please consult us again if new need arises.  Derenda FennelBashira Kathlynn Swofford, MSW [phone] 01/09/2016 1:09 PM

## 2016-01-09 NOTE — Progress Notes (Signed)
Hearing aide found in room. Ricardo SpenceGrandson [name], contacted. He stated that he would return to pick up. Will place at front desk for pick up.

## 2016-01-09 NOTE — Discharge Summary (Signed)
Physician Discharge Summary  Ricardo [name]  ZOX:[id number]  DOB: 10-01-1913  DOA: 01/06/2016 PCP: Murray Hodgkins, MD  Admit date: 01/06/2016 Discharge date: 01/09/2016  Admitted From: [facility]  Disposition:  [facility]  Recommendations for Outpatient Follow-up:  1. Follow up with PCP in 1-2 weeks 2. Please obtain BMP/CBC in one week  Home Health: PT  Discharge Condition: Stable  CODE STATUS: DNR  Diet recommendation: Heart Healthy    Brief/Interim Summary: 82 y.o.malewith medical history significant of HTN, HLD, hypothyroidism, CHF, anemia,chronic opioid use, ; who presents with complaints offever and shortness of breath. Much of patient history is obtained via report as the patient's very hard of hearing. Patient was noted to have difficulty breathing and had been noted to have a fever up to 102F at the skilled nursing facility. He also has been dealing with a chronic cough but not normally require oxygen. At baseline the patient is know to [facility] assistance of walker/cane.  ED Course:Upon admission to the emergency department patient was seen to be febrile up to 103.63F, pulse 68 -91, respirations 13-24, O2 saturations 88% on room air, and blood pressure maintained. Lab work revealed WBC 8.2, hemoglobin 10.9 on chest x-ray showing basilar congestion with bilateral airspace opacities concerning for pneumonia. Patient was placed on nasal cannula oxygen with improvement of O2 saturations greater than 92%. Then started on empiric antibiotics of cefepime and vancomycin. Furthers labs showed parainfluenza virus 1 positive. Gentle hydration was given. Patient subsequently improved, now breathing 100% on RA Physical therapy evaluation was done whom recommended home PT    Subjective: Patient seen and examined with grandson at bedside, feeling much better. Grandson reports that patient seen to be at baseline. Denies SOB, chest pain and  dizziness.   Discharge Diagnoses:   Acute respiratory failure with hypoxia secondary to parainfluenza upper infection with possible secondary healthcare associated pneumonia: Improved -Will send on Augmentin for total of 10 days, Xray concerning of pneumonia, could be viral pneumonia with superimpose bacterial infection.  -Keep good hydration  -Droplets precautions   CKDstage III: Improved with IV hydration, Cr back to baseline.   Essential hypertension -Blood pressure remains stable -Patient is thus far tolerating losartan. Will continue  Anemia of chronic disease:  -Baseline hemoglobin appears to be from 10-12. -Hemoglobin remains stable  Hypothyroidism -TSH reviewed. Remains stable -Plan to continue thyroid replacement  History of chronic systolic CHF: No signs of fluid overload   -2d echo reviewed. EF of 40-45%  Chronic opioid use -Patient remains on OxyContin as tolerated -Monitor for bowel function  BPH -continue with finasteride -continue to monitor I/o  GERD - Plan to continue PPI  Elevated troponin -Troponin noted to be borderline elevated at time of admission at 0.3. -Troponin had gradually risen with peak of 0.5 -Patient remains asymptomatic without chest pains -EKG was reviewed, no ischemic changes apparent -Had earlier discussed case with cardiologist on call who does not recommend further workup at this time, including troponins. -2-D echocardiogram reviewed with EF of 40-45%  Discharge Instructions  Discharge Instructions    Call MD for:  difficulty breathing, headache or visual disturbances    Complete by:  As directed    Call MD for:  extreme fatigue    Complete by:  As directed    Call MD for:  hives    Complete by:  As directed    Call MD for:  persistant dizziness or light-headedness    Complete by:  As directed  Call MD for:  persistant nausea and vomiting    Complete by:  As directed    Call MD for:  redness, tenderness, or  signs of infection (pain, swelling, redness, odor or green/yellow discharge around incision site)    Complete by:  As directed    Call MD for:  severe uncontrolled pain    Complete by:  As directed    Call MD for:  temperature >100.4    Complete by:  As directed    Diet - low sodium heart healthy    Complete by:  As directed    Discharge instructions    Complete by:  As directed    Increase activity slowly    Complete by:  As directed        Medication List    TAKE these medications   acetaminophen 325 MG tablet Commonly known as:  TYLENOL Take 650 mg by mouth every 6 (six) hours as needed for mild pain, moderate pain or fever.   amoxicillin-clavulanate 875-125 MG tablet Commonly known as:  AUGMENTIN Take 1 tablet by mouth 2 (two) times daily.   ARTIFICIAL TEARS 1.4 % ophthalmic solution Generic drug:  polyvinyl alcohol Place 1 drop into both eyes 2 (two) times daily.   carbamide peroxide 6.5 % otic solution Commonly known as:  DEBROX Instill 3-5 Drops into each ear once a week on Saturday and on Sunday at bedtime flush   cholecalciferol 1000 units tablet Commonly known as:  VITAMIN D Take 2,000 Units by mouth daily.   finasteride 5 MG tablet Commonly known as:  PROSCAR Take 5 mg by mouth daily.   guaiFENesin 100 MG/5ML liquid Commonly known as:  ROBITUSSIN Take 10 CC by mouth every 4 hours as needed for cough or excessive mucus   levothyroxine 50 MCG tablet Commonly known as:  SYNTHROID, LEVOTHROID Take 50 mcg by mouth daily before breakfast.   losartan 25 MG tablet Commonly known as:  COZAAR Take 25 mg by mouth daily.   omeprazole 20 MG capsule Commonly known as:  PRILOSEC Take 20 mg by mouth daily.   oxybutynin 5 MG 24 hr tablet Commonly known as:  DITROPAN-XL Take 5 mg by mouth daily.   oxyCODONE 5 MG immediate release tablet Commonly known as:  ROXICODONE Take one tablet by mouth twice daily as needed for breakthrough pain What changed:  how  much to take  how to take this  when to take this  reasons to take this  additional instructions   oxyCODONE 10 mg 12 hr tablet Commonly known as:  OXYCONTIN Take one tablet by mouth every morning for pain. Do not crush or chew. What changed:  Another medication with the same name was changed. Make sure you understand how and when to take each.   polyethylene glycol powder powder Commonly known as:  GLYCOLAX/MIRALAX Take 127.5 g by mouth daily. 17g once a day in liquid for constipation.   PRESCRIPTION MEDICATION 5-10 mLs by Mouth Rinse route every 4 (four) hours. Qdryl/Alma/ Lido mouth wash- pt to Rinse then spit.   SENEXON-S 8.6-50 MG tablet Generic drug:  senna-docusate Take 1 tablet by mouth every other day. And patient may take 1 tablet my mouth once daily as needed for constipation      Follow-up Information    [facility] [employer] .   Specialty:  Physical Therapy Why:  [facility] physical therapy @ [facility], commercial information: [address] [place] [address] [phone]  Allergies  Allergen Reactions  . Aleve [Naproxen]   . Indocin [Indomethacin]     Consultations:  None    Procedures/Studies: Dg Chest Port 1 View  Result Date: 01/06/2016 CLINICAL DATA:  Code sepsis. Fever. Altered level of consciousness. Initial encounter. EXAM: PORTABLE CHEST 1 VIEW COMPARISON:  Chest radiograph performed 12/18/2010 FINDINGS: The lungs are well-aerated. Vascular congestion is noted. Bibasilar airspace opacities raise concern for pneumonia, given clinical concern. There is no evidence of pleural effusion or pneumothorax. The cardiomediastinal silhouette is within normal limits. No acute osseous abnormalities are seen. Degenerative change is noted at the glenohumeral joints, with some degree of bony remodeling on the left. IMPRESSION: Vascular congestion noted. Bibasilar airspace opacities raise concern for pneumonia, given clinical  concern. Electronically Signed   By: Roanna Raider M.D.   On: 01/06/2016 20:41    Discharge Exam: Vitals:   01/08/16 2030 01/09/16 0521  BP: (!) 143/70 (!) 152/72  Pulse: 61 (!) 52  Resp: 16 16  Temp: 97.6 F (36.4 C) 98.6 F (37 C)   Vitals:   01/08/16 0850 01/08/16 1407 01/08/16 2030 01/09/16 0521  BP:  125/60 (!) 143/70 (!) 152/72  Pulse:  61 61 (!) 52  Resp:  16 16 16   Temp:  97.3 F (36.3 C) 97.6 F (36.4 C) 98.6 F (37 C)  TempSrc:  Oral Oral Oral  SpO2: 94% 100% 93% 94%  Weight:      Height:        General: Pt is alert, awake, not in acute distress, hard hearing  Cardiovascular: RRR, S1/S2 +, systolic murmur, no rubs, no gallops Respiratory: CTA bilaterally, no wheezing, Gargle on upper lobes, transmitted from oropharynges  Abdominal: Soft, NT, ND, bowel sounds + Extremities: no edema, no cyanosis    The results of significant diagnostics from this hospitalization (including imaging, microbiology, ancillary and laboratory) are listed below for reference.     Microbiology: Recent Results (from the past 240 hour(s))  Blood culture (routine x 2)     Status: None (Preliminary result)   Collection Time: 01/06/16  7:35 PM  Result Value Ref Range Status   Specimen Description BLOOD LEFT ANTECUBITAL  Final   Special Requests BOTTLES DRAWN AEROBIC AND ANAEROBIC 5CC  Final   Culture   Final    NO GROWTH 1 DAY Performed at [facility]    Report Status PENDING  Incomplete  Blood culture (routine x 2)     Status: None (Preliminary result)   Collection Time: 01/06/16  7:59 PM  Result Value Ref Range Status   Specimen Description BLOOD RIGHT HAND  Final   Special Requests BOTTLES DRAWN AEROBIC AND ANAEROBIC 5CC  Final   Culture   Final    NO GROWTH 1 DAY Performed at [facility]    Report Status PENDING  Incomplete  Urine culture     Status: Abnormal   Collection Time: 01/06/16  8:12 PM  Result Value Ref Range Status   Specimen Description  URINE, CLEAN CATCH  Final   Special Requests NONE  Final   Culture (A)  Final    <10,000 COLONIES/mL INSIGNIFICANT GROWTH Performed at [facility]    Report Status 01/08/2016 FINAL  Final  Respiratory Panel by PCR     Status: Abnormal   Collection Time: 01/07/16 12:30 AM  Result Value Ref Range Status   Adenovirus NOT DETECTED NOT DETECTED Final   Coronavirus 229E NOT DETECTED NOT DETECTED Final   Coronavirus HKU1 NOT  DETECTED NOT DETECTED Final   Coronavirus NL63 NOT DETECTED NOT DETECTED Final   Coronavirus OC43 NOT DETECTED NOT DETECTED Final   Metapneumovirus NOT DETECTED NOT DETECTED Final   Rhinovirus / Enterovirus NOT DETECTED NOT DETECTED Final   Influenza A NOT DETECTED NOT DETECTED Final   Influenza B NOT DETECTED NOT DETECTED Final   Parainfluenza Virus 1 DETECTED (A) NOT DETECTED Final   Parainfluenza Virus 2 NOT DETECTED NOT DETECTED Final   Parainfluenza Virus 3 NOT DETECTED NOT DETECTED Final   Parainfluenza Virus 4 NOT DETECTED NOT DETECTED Final   Respiratory Syncytial Virus NOT DETECTED NOT DETECTED Final   Bordetella pertussis NOT DETECTED NOT DETECTED Final   Chlamydophila pneumoniae NOT DETECTED NOT DETECTED Final   Mycoplasma pneumoniae NOT DETECTED NOT DETECTED Final    Comment: Performed at [facility]  MRSA PCR Screening     Status: Abnormal   Collection Time: 01/07/16 12:33 AM  Result Value Ref Range Status   MRSA by PCR POSITIVE (A) NEGATIVE Final    Comment:        The GeneXpert MRSA Assay (FDA approved for NASAL specimens only), is one component of a comprehensive MRSA colonization surveillance program. It is not intended to diagnose MRSA infection nor to guide or monitor treatment for MRSA infections. RESULT CALLED TO, READ BACK BY AND VERIFIED WITH: ARCKARDS,A RN 10.30.17 @0531  ZANDO,C      Labs: BNP (last 3 results)  Recent Labs  01/06/16 1950  BNP 285.2*   Basic Metabolic Panel:  Recent Labs Lab 01/06/16 1935  01/07/16 0439 01/08/16 0446 01/08/16 1610 01/09/16 0459  NA 136 136 134*  --  134*  K 4.8 4.1 4.5  --  4.3  CL 105 106 105  --  107  CO2 24 25 24   --  21*  GLUCOSE 115* 101* 82  --  87  BUN 35* 32* 24*  --  26*  CREATININE 1.37* 1.20 1.21  --  1.14  CALCIUM 8.3* 8.1* 8.1*  --  7.8*  MG  --   --   --  1.8  --    Liver Function Tests: No results for input(s): AST, ALT, ALKPHOS, BILITOT, PROT, ALBUMIN in the last 168 hours. No results for input(s): LIPASE, AMYLASE in the last 168 hours. No results for input(s): AMMONIA in the last 168 hours. CBC:  Recent Labs Lab 01/06/16 1935 01/07/16 0439  WBC 8.2 6.5  HGB 10.9* 9.4*  HCT 33.2* 28.8*  MCV 87.4 87.8  PLT 168 133*   Cardiac Enzymes:  Recent Labs Lab 01/07/16 0439 01/07/16 1154  TROPONINI 0.48* 0.50*   BNP: Invalid input(s): POCBNP CBG: No results for input(s): GLUCAP in the last 168 hours. D-Dimer No results for input(s): DDIMER in the last 72 hours. Hgb A1c No results for input(s): HGBA1C in the last 72 hours. Lipid Profile No results for input(s): CHOL, HDL, LDLCALC, TRIG, CHOLHDL, LDLDIRECT in the last 72 hours. Thyroid function studies  Recent Labs  01/06/16 2300  TSH 1.011   Anemia work up No results for input(s): VITAMINB12, FOLATE, FERRITIN, TIBC, IRON, RETICCTPCT in the last 72 hours. Urinalysis    Component Value Date/Time   COLORURINE YELLOW 01/06/2016 2012   APPEARANCEUR CLEAR 01/06/2016 2012   LABSPEC 1.016 01/06/2016 2012   PHURINE 6.0 01/06/2016 2012   GLUCOSEU NEGATIVE 01/06/2016 2012   HGBUR MODERATE (A) 01/06/2016 2012   BILIRUBINUR NEGATIVE 01/06/2016 2012   KETONESUR NEGATIVE 01/06/2016 2012   PROTEINUR 100 (A)  01/06/2016 2012   UROBILINOGEN 1.0 12/13/2010 1129   NITRITE NEGATIVE 01/06/2016 2012   LEUKOCYTESUR NEGATIVE 01/06/2016 2012   Sepsis Labs Invalid input(s): PROCALCITONIN,  WBC,  LACTICIDVEN Microbiology Recent Results (from the past 240 hour(s))  Blood culture  (routine x 2)     Status: None (Preliminary result)   Collection Time: 01/06/16  7:35 PM  Result Value Ref Range Status   Specimen Description BLOOD LEFT ANTECUBITAL  Final   Special Requests BOTTLES DRAWN AEROBIC AND ANAEROBIC 5CC  Final   Culture   Final    NO GROWTH 1 DAY Performed at [facility]    Report Status PENDING  Incomplete  Blood culture (routine x 2)     Status: None (Preliminary result)   Collection Time: 01/06/16  7:59 PM  Result Value Ref Range Status   Specimen Description BLOOD RIGHT HAND  Final   Special Requests BOTTLES DRAWN AEROBIC AND ANAEROBIC 5CC  Final   Culture   Final    NO GROWTH 1 DAY Performed at [facility]    Report Status PENDING  Incomplete  Urine culture     Status: Abnormal   Collection Time: 01/06/16  8:12 PM  Result Value Ref Range Status   Specimen Description URINE, CLEAN CATCH  Final   Special Requests NONE  Final   Culture (A)  Final    <10,000 COLONIES/mL INSIGNIFICANT GROWTH Performed at [facility]    Report Status 01/08/2016 FINAL  Final  Respiratory Panel by PCR     Status: Abnormal   Collection Time: 01/07/16 12:30 AM  Result Value Ref Range Status   Adenovirus NOT DETECTED NOT DETECTED Final   Coronavirus 229E NOT DETECTED NOT DETECTED Final   Coronavirus HKU1 NOT DETECTED NOT DETECTED Final   Coronavirus NL63 NOT DETECTED NOT DETECTED Final   Coronavirus OC43 NOT DETECTED NOT DETECTED Final   Metapneumovirus NOT DETECTED NOT DETECTED Final   Rhinovirus / Enterovirus NOT DETECTED NOT DETECTED Final   Influenza A NOT DETECTED NOT DETECTED Final   Influenza B NOT DETECTED NOT DETECTED Final   Parainfluenza Virus 1 DETECTED (A) NOT DETECTED Final   Parainfluenza Virus 2 NOT DETECTED NOT DETECTED Final   Parainfluenza Virus 3 NOT DETECTED NOT DETECTED Final   Parainfluenza Virus 4 NOT DETECTED NOT DETECTED Final   Respiratory Syncytial Virus NOT DETECTED NOT DETECTED Final   Bordetella pertussis NOT  DETECTED NOT DETECTED Final   Chlamydophila pneumoniae NOT DETECTED NOT DETECTED Final   Mycoplasma pneumoniae NOT DETECTED NOT DETECTED Final    Comment: Performed at [facility]   MRSA PCR Screening     Status: Abnormal   Collection Time: 01/07/16 12:33 AM  Result Value Ref Range Status   MRSA by PCR POSITIVE (A) NEGATIVE Final    Comment:        The GeneXpert MRSA Assay (FDA approved for NASAL specimens only), is one component of a comprehensive MRSA colonization surveillance program. It is not intended to diagnose MRSA infection nor to guide or monitor treatment for MRSA infections. RESULT CALLED TO, READ BACK BY AND VERIFIED WITH: ARCKARDS,A RN 10.30.17 @0531  ZANDO,C     Time coordinating discharge: Over 30 minutes  SIGNED:  Latrelle DodrillEdwin Silva, MD  [facility] 01/09/2016, 12:47 PM Pager   If 7PM-7AM, please contact night-coverage [email or web address] Password [email or web address]

## 2016-01-09 NOTE — Progress Notes (Signed)
Physical Therapy Treatment Patient Details Name: Ricardo GanserHerbert W [name] MRN: [id number] DOB: 05/29/1913 Today's Date: 01/09/2016    History of Present Illness [age] y.o. male with medical history significant of HTN, HLD, hypothyroidism, CHF, anemia, chronic opioid use, ; who presents with complaints of fever and shortness of breath.    PT Comments    Pt ambulated good distance in hallway and continues to require slight assist for steadying with challenges (such as turns).  Continue to recommend HHPT at ALF.  Follow Up Recommendations  Home health PT     Equipment Recommendations  None recommended by PT    Recommendations for Other Services       Precautions / Restrictions Precautions Precautions: Fall    Mobility  Bed Mobility Overal bed mobility: Needs Assistance Bed Mobility: Supine to Sit     Supine to sit: Supervision     General bed mobility comments: for safety  Transfers Overall transfer level: Needs assistance Equipment used: Rolling walker (2 wheeled) Transfers: Sit to/from Stand Sit to Stand: Min assist         General transfer comment: slight assist to rise, pt uses correct placement for stand to sit  Ambulation/Gait Ambulation/Gait assistance: Min assist Ambulation Distance (Feet): 240 Feet Assistive device: Rolling walker (2 wheeled) Gait Pattern/deviations: Step-through pattern;Decreased stride length;Drifts right/left;Trunk flexed     General Gait Details: cues for posture and assist for steadying with turns otherwise min/guard   Careers information officertairs            Wheelchair Mobility    Modified Rankin (Stroke Patients Only)       Balance                                    Cognition Arousal/Alertness: Awake/alert Behavior During Therapy: WFL for tasks assessed/performed Overall Cognitive Status: Within Functional Limits for tasks assessed                      Exercises      General Comments        Pertinent  Vitals/Pain Pain Assessment: No/denies pain    Home Living                      Prior Function            PT Goals (current goals can now be found in the care plan section) Progress towards PT goals: Progressing toward goals    Frequency    Min 3X/week      PT Plan Current plan remains appropriate    Co-evaluation             End of Session Equipment Utilized During Treatment: Gait belt Activity Tolerance: Patient tolerated treatment well Patient left: in chair;with call bell/phone within reach;with chair alarm set;with family/visitor present     Time: 1037-1050 PT Time Calculation (min) (ACUTE ONLY): 13 min  Charges:  $Gait Training: 8-22 mins                    G Codes:      Kambrey Hagger,KATHrine E 01/09/2016, 1:01 PM Zenovia JarredKati Charliene Inoue, PT, DPT 01/09/2016 Pager: [phone]

## 2016-01-09 NOTE — Care Management Note (Signed)
Case Management Note  Patient Details  Name: Ricardo GanserHerbert W [name] MRN: [id number] Date of Birth: [birth year]  Subjective/Objective:  HHPT,[id number] ordered-faxed to [facility](already provides [facility] per [phone]([facility]CSW managing return back to ALF.                  Action/Plan:d/c ALF w/HHPT.   Expected Discharge Date:                  Expected Discharge Plan:  Assisted Living / Rest Home  In-House Referral:  Clinical Social Work  Discharge planning Services  CM Consult  Post Acute Care Choice:    Choice offered to:  Patient  DME Arranged:    DME Agency:     HH Arranged:  PT HH Agency:     Status of Service:  Completed, signed off  If discussed at [employer] Length of Stay Meetings, dates discussed:    Additional Comments:  Lanier ClamMahabir, Farida Mcreynolds, RN 01/09/2016, 11:52 AM

## 2016-01-09 NOTE — Progress Notes (Addendum)
Patient is at baseline. A&Ox4, HOH. Ambulatory with walker and 1 assist. Discharge instructions reviewed with patients son Lenard GallowayMickey. Facility [facility] contacted, spoke to ArgyleJohn, Charity fundraiserN. Report given. Questions, concerns denied

## 2016-01-10 ENCOUNTER — Encounter: Payer: Self-pay | Admitting: Internal Medicine

## 2016-01-10 ENCOUNTER — Non-Acute Institutional Stay: Payer: Medicare PPO | Admitting: Internal Medicine

## 2016-01-10 ENCOUNTER — Telehealth: Payer: Self-pay

## 2016-01-10 DIAGNOSIS — I10 Essential (primary) hypertension: Secondary | ICD-10-CM

## 2016-01-10 DIAGNOSIS — R05 Cough: Secondary | ICD-10-CM

## 2016-01-10 DIAGNOSIS — D509 Iron deficiency anemia, unspecified: Secondary | ICD-10-CM

## 2016-01-10 DIAGNOSIS — N183 Chronic kidney disease, stage 3 unspecified: Secondary | ICD-10-CM

## 2016-01-10 DIAGNOSIS — R609 Edema, unspecified: Secondary | ICD-10-CM | POA: Diagnosis not present

## 2016-01-10 DIAGNOSIS — B348 Other viral infections of unspecified site: Secondary | ICD-10-CM

## 2016-01-10 DIAGNOSIS — R059 Cough, unspecified: Secondary | ICD-10-CM

## 2016-01-10 DIAGNOSIS — J9600 Acute respiratory failure, unspecified whether with hypoxia or hypercapnia: Secondary | ICD-10-CM | POA: Insufficient documentation

## 2016-01-10 DIAGNOSIS — J9601 Acute respiratory failure with hypoxia: Secondary | ICD-10-CM

## 2016-01-10 NOTE — Progress Notes (Signed)
History and Physical      Location:  [facility] Room Number: AL32 Place of Service:  ALF ([phone])  PCP: Murray HodgkinsArthur Dimonique Bourdeau, MD Patient Care Team: Kimber RelicArthur G Maximillian Habibi, MD as PCP - General (Internal Medicine) Man Johnney OuX Mast, NP as Nurse Practitioner (Internal Medicine)  Extended Emergency Contact Information Primary Emergency Contact: [name],Phillip          [place] Home Phone: [phone] Relation: Son Secondary Emergency Contact: [name],[name]  [place] Home Phone: [phone] Relation: Grandson  Code Status: Full Code Goals of Care: Advanced Directive information Advanced Directives 01/10/2016  Does patient have an advance directive? Yes  Type of Estate agentAdvance Directive Healthcare Power of [place];Living will  Does patient want to make changes to advanced directive? -  Copy of advanced directive(s) in chart? Yes  Pre-existing out of facility DNR order (yellow form or pink MOST form) Pink MOST form placed in chart (order not valid for inpatient use)      Chief Complaint  Patient presents with  . Readmit to AL    following hospitalization 01/06/16 to 01/09/16 acute respiratory failure    HPI: Patient is a [age] y.o. male seen today for readmission to [facility] following hospitaer than usual. CXR showed bilateral airspace opacities concerning for pneumonisa. Treated with cefepime and vancomycin initially. Subsequent lab showed parainfluenza virus 1 positive. O2 support was needed initially, but he was comfortable on room air when discharged. He continues with a dry residual cough consistent with his chronic cough.  Other problems that remained stable included: HTN, Hypothyroidism, anemia of chroic disease, compensated CHF. CKD 3, GERD, and OA treated with oxycodone.  Past Medical History:  Diagnosis Date  . AAA (abdominal aortic aneurysm) (HCC)   . Abnormality of gait 08/01/2010  . Adhesive capsulitis of shoulder 08/01/2010   "Frozen" right shoulder  . Anemia   . Anxiety state, unspecified 08/01/2010  . Arthritis   . Congestive heart failure, unspecified 08/01/2010  . Cough 08/07/2015  . Disorders of bursae and tendons in shoulder region, unspecified 08/01/2010  . Edema 08/01/2010  . Fall   . Fecal impaction (HCC)   . GERD (gastroesophageal reflux disease)   . Hiatal hernia   . Hypercalcemia   . Hyperlipidemia   . Hypertension   . Hypertrophy of prostate without urinary obstruction and other lower urinary tract symptoms (LUTS) 08/01/2010  . Hyponatremia   . Hypothyroidism   . Infectious colitis   . Insomnia, unspecified 08/01/2010  . Joint pain   . Leg pain    with walking  . Lumbago 12/24/2010  . Orthostatic hypotension 11/05/2010  . Other specified cardiac dysrhythmias(427.89) 12/24/2010  . Pain in joint, pelvic region and thigh 08/06/2010  . Peptic ulcer, unspecified site, unspecified as acute or chronic, without mention of hemorrhage, perforation, or obstruction 08/01/2010  . Renal failure, acute (HCC)   . Swelling, mass, or lump in head and neck 01/03/2011  . Ulcer (HCC)    Stomach  . Unspecified disorder of kidney and ureter 08/01/2010  . Unspecified hearing loss 08/01/2010  . Unspecified vitamin D deficiency 08/06/2010  . Urinary frequency 09/13/2010  . Vertigo    Past Surgical History:  Procedure Laterality Date  . ABDOMINAL AORTIC ANEURYSM REPAIR  12-18-10   Stent graft repair of AAA  . APPENDECTOMY    . CATARACT EXTRACTION    . HEMORRHOID SURGERY  2000  . INGUINAL HERNIA REPAIR Left 2005  . TRANSTHORACIC ECHOCARDIOGRAM  07/19/2010    Left ventricle: There is hypokinesis of the inferior wall and  posterior wall. The EF is 35-40%. The cavity size was normal    reports that he has never smoked. He has never used smokeless tobacco. He reports that he drinks alcohol. He reports that he does not use drugs. Social History   Social History  . Marital status: Widowed    Spouse name: N/A  . Number  of children: N/A  . Years of education: N/A   Occupational History  . previous owner of [employer]    Social History Main Topics  . Smoking status: Never Smoker  . Smokeless tobacco: Never Used  . Alcohol use 0.0 oz/week     Comment: 1 1/2 oz daily   . Drug use: No  . Sexual activity: No   Other Topics Concern  . Not on file   Social History Narrative   [facility] to [facility] 09/06/10   Living Will, MOST, POA   Widowed   Exercises: no   Power wheelchair   Alcohol 1 1/2 oz daily   Never smoked                Functional Status Survey:    Family History  Problem Relation Age of Onset  . Heart failure Brother     Health Maintenance  Topic Date Due  . ZOSTAVAX  07/07/1975  . PNA vac Low Risk Adult (2 of 2 - PCV13) 03/10/2006  . TETANUS/TDAP  03/10/2009  . INFLUENZA VACCINE  Completed    Allergies  Allergen Reactions  . Aleve [Naproxen]   . Indocin [Indomethacin]       Medication List       Accurate as of 01/10/16  4:18 PM. Always use your most recent med list.          acetaminophen 325 MG tablet Commonly known as:  TYLENOL Take 650 mg by mouth every 6 (six) hours as needed for mild pain, moderate pain or fever.   amoxicillin-clavulanate 875-125 MG tablet Commonly known as:  AUGMENTIN Take 1 tablet by mouth 2 (two) times daily.   ARTIFICIAL TEARS 1.4 % ophthalmic solution Generic drug:  polyvinyl alcohol Place 1 drop into both eyes 2 (two) times daily.   carbamide peroxide 6.5 % otic solution Commonly known as:  DEBROX Instill 3-5 Drops into each ear once a week on Saturday and on Sunday at bedtime flush   cholecalciferol 1000 units tablet Commonly known as:  VITAMIN D Take 2,000 Units by mouth daily.   finasteride 5 MG tablet Commonly known as:  PROSCAR Take 5 mg by mouth daily.   guaiFENesin 100 MG/5ML liquid Commonly known as:  ROBITUSSIN Take 10 CC by mouth every 4 hours as needed for cough or  excessive mucus   levothyroxine 50 MCG tablet Commonly known as:  SYNTHROID, LEVOTHROID Take 50 mcg by mouth daily before breakfast.   losartan 25 MG tablet Commonly known as:  COZAAR Take 25 mg by mouth daily.   omeprazole 20 MG capsule Commonly known as:  PRILOSEC Take 20 mg by mouth daily.   oxybutynin 5 MG 24 hr tablet Commonly known as:  DITROPAN-XL Take 5 mg by mouth daily.   oxyCODONE 5 MG immediate release tablet Commonly known as:  Oxy IR/ROXICODONE Take 5 mg by mouth. Take one tablet two times a day as needed for breakthrough pain   oxyCODONE 10 mg 12 hr tablet Commonly known  as:  OXYCONTIN Take one tablet by mouth every morning for pain. Do not crush or chew.   polyethylene glycol powder powder Commonly known as:  GLYCOLAX/MIRALAX Take 127.5 g by mouth daily. 17g once a day in liquid for constipation.   PRESCRIPTION MEDICATION 5-10 mLs by Mouth Rinse route every 4 (four) hours. Qdryl/Alma/ Lido mouth wash- pt to Rinse then spit.   SENEXON-S 8.6-50 MG tablet Generic drug:  senna-docusate Take 1 tablet by mouth every other day. And patient may take 1 tablet my mouth once daily as needed for constipation       Review of Systems  Constitutional: Negative for fever.       Elderly. Frail.  HENT: Positive for hearing loss.        Xerostomia Dental discomfort right mandible.  Eyes: Negative.  Negative for discharge and redness.  Respiratory: Positive for cough and chest tightness. Negative for shortness of breath and wheezing.   Cardiovascular: Positive for leg swelling. Negative for chest pain and palpitations.  Gastrointestinal: Negative for abdominal distention, abdominal pain, anal bleeding, blood in stool, constipation, diarrhea, nausea and vomiting.       Hx gastric ulcer and GERD.  Endocrine: Negative.   Genitourinary: Positive for frequency.  Musculoskeletal: Positive for arthralgias (chronic shoulder pains), back pain and gait problem. Negative for  joint swelling, myalgias, neck pain and neck stiffness.  Skin: Negative.  Negative for rash.  Allergic/Immunologic: Negative.   Neurological: Negative for weakness.       On and off tinglings in the left fingers-can be relieved by touching his forehead with left fingers.   Psychiatric/Behavioral: Negative for agitation, behavioral problems, confusion, hallucinations and sleep disturbance. The patient is not nervous/anxious and is not hyperactive.     Vitals:   01/10/16 1547  BP: 112/62  Pulse: 68  Resp: 16  Temp: 98.5 F (36.9 C)  SpO2: 94%  Weight: 147 lb (66.7 kg)  Height: 5\' 9"  (1.753 m)   Body mass index is 21.71 kg/m. Physical Exam  Constitutional: He is oriented to person, place, and time. He appears well-developed and well-nourished. No distress.  HENT:  Right Ear: External ear normal.  Left Ear: External ear normal.  Nose: Nose normal.  Mouth/Throat: Oropharynx is clear and moist. No oropharyngeal exudate.  Severe bilateral hearing loss  Eyes: Conjunctivae and EOM are normal. Pupils are equal, round, and reactive to light.  Neck: No JVD present. No tracheal deviation present. No thyromegaly present.  Cardiovascular: Regular rhythm and intact distal pulses.  Exam reveals no gallop and no friction rub.   No murmur heard. 3/6 systolic ejection murmur at aortic area.  Pulmonary/Chest: Effort normal. No respiratory distress. He has no wheezes. He has rales. He exhibits no tenderness.  Abdominal: He exhibits no distension and no mass. There is no tenderness.  Musculoskeletal: He exhibits edema (1+ bilaterally in lower legs and feet). He exhibits no tenderness.  Tender in the low back with percussion. Unstable when standing. Rides in a wheelchair.   Lymphadenopathy:    He has no cervical adenopathy.  Neurological: He is alert and oriented to person, place, and time. He has normal reflexes. No cranial nerve deficit. Coordination normal.  Skin: No rash noted. No erythema. No  pallor.  Psychiatric: He has a normal mood and affect. His behavior is normal. Judgment and thought content normal.    Labs reviewed: Basic Metabolic Panel:  Recent Labs  01/04/24 0439 01/08/16 0446 01/08/16 1610 01/09/16 0459  NA 136 134*  --  134*  K 4.1 4.5  --  4.3  CL 106 105  --  107  CO2 25 24  --  21*  GLUCOSE 101* 82  --  87  BUN 32* 24*  --  26*  CREATININE 1.20 1.21  --  1.14  CALCIUM 8.1* 8.1*  --  7.8*  MG  --   --  1.8  --    Liver Function Tests:  Recent Labs  04/26/15 06/25/15  AST 18 17  ALT 8* 8*  ALKPHOS 107 107   No results for input(s): LIPASE, AMYLASE in the last 8760 hours. No results for input(s): AMMONIA in the last 8760 hours. CBC:  Recent Labs  11/05/15 01/06/16 1935 01/07/16 0439  WBC 6.1 8.2 6.5  HGB 11.9* 10.9* 9.4*  HCT 36* 33.2* 28.8*  MCV  --  87.4 87.8  PLT 186 168 133*   Cardiac Enzymes:  Recent Labs  01/07/16 0439 01/07/16 1154  TROPONINI 0.48* 0.50*   BNP: Invalid input(s): POCBNP No results found for: HGBA1C Lab Results  Component Value Date   TSH 1.011 01/06/2016   Lab Results  Component Value Date   VITAMINB12 690 04/21/2010   Lab Results  Component Value Date   FOLATE  04/21/2010    14.6 (NOTE)  Reference Ranges        Deficient:       0.4 - 3.3 ng/mL        Indeterminate:   3.4 - 5.4 ng/mL        Normal:              > 5.4 ng/mL   Lab Results  Component Value Date   IRON 12 (L) 04/21/2010   TIBC 182 (L) 04/21/2010   FERRITIN 153 04/21/2010    Imaging and Procedures obtained prior to SNF admission: Dg Chest Port 1 View  Result Date: 01/06/2016 CLINICAL DATA:  Code sepsis. Fever. Altered level of consciousness. Initial encounter. EXAM: PORTABLE CHEST 1 VIEW COMPARISON:  Chest radiograph performed 12/18/2010 FINDINGS: The lungs are well-aerated. Vascular congestion is noted. Bibasilar airspace opacities raise concern for pneumonia, given clinical concern. There is no evidence of pleural effusion or  pneumothorax. The cardiomediastinal silhouette is within normal limits. No acute osseous abnormalities are seen. Degenerative change is noted at the glenohumeral joints, with some degree of bony remodeling on the left. IMPRESSION: Vascular congestion noted. Bibasilar airspace opacities raise concern for pneumonia, given clinical concern. Electronically Signed   By: Roanna Raider M.D.   On: 01/06/2016 20:41    Assessment/Plan  1. Acute respiratory failure with hypoxia (HCC) Improved.  2. Parainfluenza type 1 infection Symptomatic treatment  3. Essential hypertension controlled  4. Edema, unspecified type Chronic and unchanged  5. Chronic kidney disease, stage III (moderate) stable  6. Cough chronic  7. Iron deficiency anemia, unspecified iron deficiency anemia type - cbc, future

## 2016-01-10 NOTE — Telephone Encounter (Signed)
Possible re-admission to facility  - [facility] Hospital fu is needed IF patient was re-admitted to facility upon discharge. Hospital discharge 01/09/2016.

## 2016-01-12 LAB — CULTURE, BLOOD (ROUTINE X 2)
CULTURE: NO GROWTH
Culture: NO GROWTH

## 2016-01-17 DIAGNOSIS — I10 Essential (primary) hypertension: Secondary | ICD-10-CM | POA: Diagnosis not present

## 2016-01-17 DIAGNOSIS — D649 Anemia, unspecified: Secondary | ICD-10-CM | POA: Diagnosis not present

## 2016-01-17 LAB — CBC AND DIFFERENTIAL
HCT: 32 % — AB (ref 41–53)
Hemoglobin: 10.7 g/dL — AB (ref 13.5–17.5)
Platelets: 235 10*3/uL (ref 150–399)
WBC: 8.2 10^3/mL

## 2016-01-17 LAB — BASIC METABOLIC PANEL
BUN: 23 mg/dL — AB (ref 4–21)
CREATININE: 1.2 mg/dL (ref <=1.3)
Glucose: 102 mg/dL
Potassium: 4.8 mmol/L (ref 3.4–5.3)
SODIUM: 136 mmol/L — AB (ref 137–147)

## 2016-01-18 ENCOUNTER — Encounter: Payer: Self-pay | Admitting: Nurse Practitioner

## 2016-01-18 ENCOUNTER — Non-Acute Institutional Stay: Payer: Medicare PPO | Admitting: Nurse Practitioner

## 2016-01-18 DIAGNOSIS — N183 Chronic kidney disease, stage 3 unspecified: Secondary | ICD-10-CM

## 2016-01-18 DIAGNOSIS — N400 Enlarged prostate without lower urinary tract symptoms: Secondary | ICD-10-CM | POA: Diagnosis not present

## 2016-01-18 DIAGNOSIS — D509 Iron deficiency anemia, unspecified: Secondary | ICD-10-CM | POA: Diagnosis not present

## 2016-01-18 DIAGNOSIS — E039 Hypothyroidism, unspecified: Secondary | ICD-10-CM

## 2016-01-18 DIAGNOSIS — K5900 Constipation, unspecified: Secondary | ICD-10-CM

## 2016-01-18 DIAGNOSIS — I10 Essential (primary) hypertension: Secondary | ICD-10-CM

## 2016-01-18 DIAGNOSIS — M159 Polyosteoarthritis, unspecified: Secondary | ICD-10-CM

## 2016-01-18 DIAGNOSIS — K219 Gastro-esophageal reflux disease without esophagitis: Secondary | ICD-10-CM

## 2016-01-18 DIAGNOSIS — M150 Primary generalized (osteo)arthritis: Secondary | ICD-10-CM

## 2016-01-18 NOTE — Assessment & Plan Note (Signed)
Chronic, w/c for mobility, Oxycontin 10mg  and Tylenol.

## 2016-01-18 NOTE — Assessment & Plan Note (Signed)
01/17/16 Bun 23, creat 1.15

## 2016-01-18 NOTE — Progress Notes (Signed)
Location:  [facility] Room Number: 32 Place of Service:  ALF [phone]) Provider:  Trevante Tennell, Manxie  NP  Murray Hodgkins, MD  Patient Care Team: Kimber Relic, MD as PCP - General (Internal Medicine) Nikeshia Keetch Johnney Ou, NP as Nurse Practitioner (Internal Medicine)  Extended Emergency Contact Information Primary Emergency Contact: [name], [place] Home Phone: [phone] Relation: Son Secondary Emergency Contact: [name] [place] Home Phone: [phone] Relation: Grandson  Code Status:  Full Code Goals of care: Advanced Directive information Advanced Directives 01/18/2016  Does patient have an advance directive? Yes  Type of Estate agent of [place];Living will  Does patient want to make changes to advanced directive? No - Patient declined  Copy of advanced directive(s) in chart? Yes  Pre-existing out of facility DNR order (yellow form or pink MOST form) -     Chief Complaint  Patient presents with  . Acute Visit    Low BP and High HR    HPI:  Pt is a 82 y.o. male seen today for an acute visit for resolved dizziness, tachycardia, and lower BP 94/72 01/16/16 after Cozaar held. 01/16/17 wbc 8.2, Hgb 10.7, plt 235, Na 136, K 4.8, Bun 23, creat 1.15. No new focal neurological deficits noted.    Hx of HTN, controlled on Losartan 25mg , urinary frequency, managed with Finasteride 5mg , Oxybutynin. hypothyroidism, taking Levothyroxine , depression, mood is stable, off mood stabilizer, asymptomatic GERD while on Omeprazole 20mg  daily, memory deficit, resides in [facility] for care needs, chronic lower back pain, managed with Tylenol 650mg  q6h prn, Oxycodone 5mg  qam, bid prn, no constipation while on  Senna I qod, chronic dizziness is managed with Meclizine 25mg  bid.   Past Medical History:  Diagnosis Date  . AAA (abdominal aortic aneurysm) (HCC)   . Abnormality of gait 08/01/2010  .  Adhesive capsulitis of shoulder 08/01/2010   "Frozen" right shoulder  . Anemia   . Anxiety state, unspecified 08/01/2010  . Arthritis   . Congestive heart failure, unspecified 08/01/2010  . Cough 08/07/2015  . Disorders of bursae and tendons in shoulder region, unspecified 08/01/2010  . Edema 08/01/2010  . Fall   . Fecal impaction (HCC)   . GERD (gastroesophageal reflux disease)   . Hiatal hernia   . Hypercalcemia   . Hyperlipidemia   . Hypertension   . Hypertrophy of prostate without urinary obstruction and other lower urinary tract symptoms (LUTS) 08/01/2010  . Hyponatremia   . Hypothyroidism   . Infectious colitis   . Insomnia, unspecified 08/01/2010  . Joint pain   . Leg pain    with walking  . Lumbago 12/24/2010  . Orthostatic hypotension 11/05/2010  . Other specified cardiac dysrhythmias(427.89) 12/24/2010  . Pain in joint, pelvic region and thigh 08/06/2010  . Peptic ulcer, unspecified site, unspecified as acute or chronic, without mention of hemorrhage, perforation, or obstruction 08/01/2010  . Renal failure, acute (HCC)   . Swelling, mass, or lump in head and neck 01/03/2011  . Ulcer (HCC)    Stomach  . Unspecified disorder of kidney and ureter 08/01/2010  . Unspecified hearing loss 08/01/2010  . Unspecified vitamin D deficiency 08/06/2010  . Urinary frequency 09/13/2010  . Vertigo    Past Surgical History:  Procedure Laterality Date  . ABDOMINAL AORTIC ANEURYSM REPAIR  12-18-10   Stent graft repair of AAA  . APPENDECTOMY    . CATARACT EXTRACTION    .  HEMORRHOID SURGERY  2000  . INGUINAL HERNIA REPAIR Left 2005  . TRANSTHORACIC ECHOCARDIOGRAM  07/19/2010    Left ventricle: There is hypokinesis of the inferior wall and  posterior wall. The EF is 35-40%. The cavity size was normal    Allergies  Allergen Reactions  . Aleve [Naproxen]   . Indocin [Indomethacin]       Medication List       Accurate as of 01/18/16  2:07 PM. Always use your most recent med list.           acetaminophen 325 MG tablet Commonly known as:  TYLENOL Take 650 mg by mouth every 6 (six) hours as needed for mild pain, moderate pain or fever.   ARTIFICIAL TEARS 1.4 % ophthalmic solution Generic drug:  polyvinyl alcohol Place 1 drop into both eyes 2 (two) times daily.   carbamide peroxide 6.5 % otic solution Commonly known as:  DEBROX Instill 3-5 Drops into each ear once a week on Saturday and on Sunday at bedtime flush   cholecalciferol 1000 units tablet Commonly known as:  VITAMIN D Take 2,000 Units by mouth daily.   finasteride 5 MG tablet Commonly known as:  PROSCAR Take 5 mg by mouth daily.   guaiFENesin 100 MG/5ML liquid Commonly known as:  ROBITUSSIN Take 10 CC by mouth every 4 hours as needed for cough or excessive mucus   levothyroxine 50 MCG tablet Commonly known as:  SYNTHROID, LEVOTHROID Take 50 mcg by mouth daily before breakfast.   losartan 25 MG tablet Commonly known as:  COZAAR Take 25 mg by mouth daily.   omeprazole 20 MG capsule Commonly known as:  PRILOSEC Take 20 mg by mouth daily.   oxybutynin 5 MG 24 hr tablet Commonly known as:  DITROPAN-XL Take 5 mg by mouth daily.   oxyCODONE 5 MG immediate release tablet Commonly known as:  Oxy IR/ROXICODONE Take 5 mg by mouth. Take one tablet two times a day as needed for breakthrough pain   oxyCODONE 10 mg 12 hr tablet Commonly known as:  OXYCONTIN Take one tablet by mouth every morning for pain. Do not crush or chew.   polyethylene glycol powder powder Commonly known as:  GLYCOLAX/MIRALAX Take 127.5 g by mouth daily. 17g once a day in liquid for constipation.   PRESCRIPTION MEDICATION 5-10 mLs by Mouth Rinse route every 4 (four) hours. Qdryl/Alma/ Lido mouth wash- pt to Rinse then spit.   SENEXON-S 8.6-50 MG tablet Generic drug:  senna-docusate Take 1 tablet by mouth every other day. And patient may take 1 tablet my mouth once daily as needed for constipation       Review of Systems    Constitutional: Negative for fever.       Elderly. Frail.  HENT: Positive for hearing loss.        Xerostomia Dental discomfort right mandible.  Eyes: Negative.  Negative for discharge and redness.  Respiratory: Positive for cough and chest tightness. Negative for shortness of breath and wheezing.   Cardiovascular: Positive for leg swelling. Negative for chest pain and palpitations.  Gastrointestinal: Negative for abdominal distention, abdominal pain, anal bleeding, blood in stool, constipation, diarrhea, nausea and vomiting.       Hx gastric ulcer and GERD.  Endocrine: Negative.   Genitourinary: Positive for frequency.  Musculoskeletal: Positive for arthralgias (chronic shoulder pains), back pain and gait problem. Negative for joint swelling, myalgias, neck pain and neck stiffness.  Skin: Negative.  Negative for rash.  Allergic/Immunologic: Negative.  Neurological: Negative for weakness.       On and off tinglings in the left fingers-can be relieved by touching his forehead with left fingers.   Psychiatric/Behavioral: Negative for agitation, behavioral problems, confusion, hallucinations and sleep disturbance. The patient is not nervous/anxious and is not hyperactive.     Immunization History  Administered Date(s) Administered  . Influenza-Unspecified 12/13/2013, 12/14/2014, 12/20/2015  . Pneumococcal Polysaccharide-23 03/10/2005  . Td 03/11/1999   Pertinent  Health Maintenance Due  Topic Date Due  . PNA vac Low Risk Adult (2 of 2 - PCV13) 03/10/2006  . INFLUENZA VACCINE  Completed   Fall Risk  08/07/2015 08/07/2015 04/10/2015 03/27/2015  Falls in the past year? Yes No No No   Functional Status Survey:    Vitals:   01/18/16 1249  BP: 122/80  Pulse: 69  Resp: 20  Temp: 97.1 F (36.2 C)  Weight: 147 lb (66.7 kg)  Height: 5\' 9"  (1.753 m)   Body mass index is 21.71 kg/m. Physical Exam  Constitutional: He is oriented to person, place, and time. He appears well-developed  and well-nourished. No distress.  HENT:  Right Ear: External ear normal.  Left Ear: External ear normal.  Nose: Nose normal.  Mouth/Throat: Oropharynx is clear and moist. No oropharyngeal exudate.  Severe bilateral hearing loss  Eyes: Conjunctivae and EOM are normal. Pupils are equal, round, and reactive to light.  Neck: No JVD present. No tracheal deviation present. No thyromegaly present.  Cardiovascular: Regular rhythm and intact distal pulses.  Exam reveals no gallop and no friction rub.   Murmur heard. 3/6 systolic ejection murmur at aortic area.  Pulmonary/Chest: Effort normal. No respiratory distress. He has no wheezes. He has rales. He exhibits no tenderness.  Abdominal: He exhibits no distension and no mass. There is no tenderness.  Musculoskeletal: He exhibits edema (1+ bilaterally in lower legs and feet). He exhibits no tenderness.  Tender in the low back with percussion. Unstable when standing. Rides in a wheelchair.   Lymphadenopathy:    He has no cervical adenopathy.  Neurological: He is alert and oriented to person, place, and time. He has normal reflexes. No cranial nerve deficit. Coordination normal.  Skin: No rash noted. No erythema. No pallor.  Psychiatric: He has a normal mood and affect. His behavior is normal. Judgment and thought content normal.    Labs reviewed:  Recent Labs  01/07/16 0439 01/08/16 0446 01/08/16 1610 01/09/16 0459 01/17/16  NA 136 134*  --  134* 136*  K 4.1 4.5  --  4.3 4.8  CL 106 105  --  107  --   CO2 25 24  --  21*  --   GLUCOSE 101* 82  --  87  --   BUN 32* 24*  --  26* 23*  CREATININE 1.20 1.21  --  1.14 1.2  CALCIUM 8.1* 8.1*  --  7.8*  --   MG  --   --  1.8  --   --     Recent Labs  04/26/15 06/25/15  AST 18 17  ALT 8* 8*  ALKPHOS 107 107    Recent Labs  01/06/16 1935 01/07/16 0439 01/17/16  WBC 8.2 6.5 8.2  HGB 10.9* 9.4* 10.7*  HCT 33.2* 28.8* 32*  MCV 87.4 87.8  --   PLT 168 133* 235   Lab Results    Component Value Date   TSH 1.011 01/06/2016   No results found for: HGBA1C Lab Results  Component Value Date  CHOL 169 06/25/2015   HDL 51 06/25/2015   LDLCALC 102 06/25/2015   TRIG 82 06/25/2015    Significant Diagnostic Results in last 30 days:  Dg Chest Port 1 View  Result Date: 01/06/2016 CLINICAL DATA:  Code sepsis. Fever. Altered level of consciousness. Initial encounter. EXAM: PORTABLE CHEST 1 VIEW COMPARISON:  Chest radiograph performed 12/18/2010 FINDINGS: The lungs are well-aerated. Vascular congestion is noted. Bibasilar airspace opacities raise concern for pneumonia, given clinical concern. There is no evidence of pleural effusion or pneumothorax. The cardiomediastinal silhouette is within normal limits. No acute osseous abnormalities are seen. Degenerative change is noted at the glenohumeral joints, with some degree of bony remodeling on the left. IMPRESSION: Vascular congestion noted. Bibasilar airspace opacities raise concern for pneumonia, given clinical concern. Electronically Signed   By: Roanna Raider M.D.   On: 01/06/2016 20:41    Assessment/Plan: Essential hypertension resolved dizziness, tachycardia, and lower BP 94/72 01/16/16 after Cozaar held. 01/16/17 wbc 8.2, Hgb 10.7, plt 235, Na 136, K 4.8, Bun 23, creat 1.15. No new focal neurological deficits noted.    GERD Stable, continue Omeprazole 20mg  daily    Constipation Stable, continue Senokot S I qod     Hypothyroidism 06/25/15 TSH 3.7, continue  Levothyroxine    Osteoarthritis Chronic, w/c for mobility, Oxycontin 10mg  and Tylenol.    Chronic kidney disease, stage III (moderate) 01/17/16 Bun 23, creat 1.15  BPH (benign prostatic hyperplasia) No urinary retention, takes Oxybutynin 5mg  bid, Finasteride 5mg  daily  Iron deficiency anemia 01/17/16 Hgb 10.7     Family/ staff Communication: IL  Labs/tests ordered:  CBC BMP done 12/17/15

## 2016-01-18 NOTE — Assessment & Plan Note (Signed)
01/17/16 Hgb 10.7

## 2016-01-18 NOTE — Assessment & Plan Note (Signed)
resolved dizziness, tachycardia, and lower BP 94/72 01/16/16 after Cozaar held. 01/16/17 wbc 8.2, Hgb 10.7, plt 235, Na 136, K 4.8, Bun 23, creat 1.15. No new focal neurological deficits noted.

## 2016-01-18 NOTE — Assessment & Plan Note (Signed)
Stable, continue Omeprazole 20mg daily.  

## 2016-01-18 NOTE — Assessment & Plan Note (Signed)
06/25/15 TSH 3.7, continue  Levothyroxine 50mcg

## 2016-01-18 NOTE — Assessment & Plan Note (Signed)
Stable, continue Senokot S I qod.  

## 2016-01-18 NOTE — Assessment & Plan Note (Signed)
No urinary retention, takes Oxybutynin 5mg  bid, Finasteride 5mg  daily

## 2016-01-28 DIAGNOSIS — H6121 Impacted cerumen, right ear: Secondary | ICD-10-CM | POA: Diagnosis not present

## 2016-02-08 ENCOUNTER — Other Ambulatory Visit: Payer: Self-pay | Admitting: *Deleted

## 2016-02-08 MED ORDER — OXYCODONE HCL ER 10 MG PO T12A: EXTENDED_RELEASE_TABLET | ORAL | 0 refills | Status: DC

## 2016-02-08 NOTE — Telephone Encounter (Signed)
[facility]

## 2016-02-20 ENCOUNTER — Other Ambulatory Visit: Payer: Self-pay | Admitting: *Deleted

## 2016-02-20 MED ORDER — OXYCODONE HCL 5 MG PO TABS: ORAL_TABLET | ORAL | 0 refills | Status: DC

## 2016-02-20 NOTE — Telephone Encounter (Signed)
[facility] 

## 2016-02-26 ENCOUNTER — Encounter: Payer: Self-pay | Admitting: Nurse Practitioner

## 2016-02-26 NOTE — Progress Notes (Signed)
Location[facility] Room Number: 32 Place of Service:  ALF [phone]) Provider: Mast, Manxie  NP  Murray HodgkinsArthur Green, MD  Patient Care Team: [name], MD as PCP - General (Internal Medicine) Man Johnney OuX Mast, NP as Nurse Practitioner (Internal Medicine)  Extended Emergency Contact Information Primary Emergency Contact: [name],Phillip          [place] Home Phone: [phone] Relation: Son Secondary Emergency Contact: [name],[name]  [place] Home Phone: [phone] Relation: Grandson  Code Status:  Full Code Goals of care: Advanced Directive information Advanced Directives 02/26/2016  Does Patient Have a Medical Advance Directive? Yes  Type of Estate agentAdvance Directive Healthcare Power of [place];Living will  Does patient want to make changes to medical advance directive? No - Patient declined  Copy of Healthcare Power of Attorney in Chart? Yes  Pre-existing out of facility DNR order (yellow form or pink MOST form) Pink MOST form placed in chart (order not valid for inpatient use)     Chief Complaint  Patient presents with  . Medical Management of Chronic Issues    HPI:  Pt is a [age] y.o. male seen today for medical management of chronic diseases.     Past Medical History:  Diagnosis Date  . AAA (abdominal aortic aneurysm) (HCC)   . Abnormality of gait 08/01/2010  . Adhesive capsulitis of shoulder 08/01/2010   "Frozen" right shoulder  . Anemia   . Anxiety state, unspecified 08/01/2010  . Arthritis   . Congestive heart failure, unspecified 08/01/2010  . Cough 08/07/2015  . Disorders of bursae and tendons in shoulder region, unspecified 08/01/2010  . Edema 08/01/2010  . Fall   . Fecal impaction (HCC)   . GERD (gastroesophageal reflux disease)   . Hiatal hernia   . Hypercalcemia   . Hyperlipidemia   . Hypertension   . Hypertrophy of prostate without urinary obstruction and other lower urinary tract symptoms (LUTS)  08/01/2010  . Hyponatremia   . Hypothyroidism   . Infectious colitis   . Insomnia, unspecified 08/01/2010  . Joint pain   . Leg pain    with walking  . Lumbago 12/24/2010  . Orthostatic hypotension 11/05/2010  . Other specified cardiac dysrhythmias(427.89) 12/24/2010  . Pain in joint, pelvic region and thigh 08/06/2010  . Peptic ulcer, unspecified site, unspecified as acute or chronic, without mention of hemorrhage, perforation, or obstruction 08/01/2010  . Renal failure, acute (HCC)   . Swelling, mass, or lump in head and neck 01/03/2011  . Ulcer (HCC)    Stomach  . Unspecified disorder of kidney and ureter 08/01/2010  . Unspecified hearing loss 08/01/2010  . Unspecified vitamin D deficiency 08/06/2010  . Urinary frequency 09/13/2010  . Vertigo    Past Surgical History:  Procedure Laterality Date  . ABDOMINAL AORTIC ANEURYSM REPAIR  12-18-10   Stent graft repair of AAA  . APPENDECTOMY    . CATARACT EXTRACTION    . HEMORRHOID SURGERY  2000  . INGUINAL HERNIA REPAIR Left 2005  . TRANSTHORACIC ECHOCARDIOGRAM  07/19/2010    Left ventricle: There is hypokinesis of the inferior wall and  posterior wall. The EF is 35-40%. The cavity size was normal    Allergies  Allergen Reactions  . Aleve [Naproxen]   . Indocin [Indomethacin]     Allergies as of 02/26/2016      Reactions   Aleve [naproxen]    Indocin [indomethacin]       Medication List  Accurate as of 02/26/16 11:02 AM. Always use your most recent med list.          acetaminophen 325 MG tablet Commonly known as:  TYLENOL Take 650 mg by mouth every 6 (six) hours as needed for mild pain, moderate pain or fever.   ARTIFICIAL TEARS 1.4 % ophthalmic solution Generic drug:  polyvinyl alcohol Place 1 drop into both eyes 2 (two) times daily.   benzocaine 10 % mucosal gel Commonly known as:  ORAJEL Use as directed 1 application in the mouth or throat as needed for mouth pain.   carbamide peroxide 6.5 % otic  solution Commonly known as:  DEBROX Instill 3-5 Drops into each ear once a week on Saturday and on Sunday at bedtime flush   cholecalciferol 1000 units tablet Commonly known as:  VITAMIN D Take 2,000 Units by mouth daily.   finasteride 5 MG tablet Commonly known as:  PROSCAR Take 5 mg by mouth daily.   guaiFENesin 100 MG/5ML liquid Commonly known as:  ROBITUSSIN Take 10 CC by mouth every 4 hours as needed for cough or excessive mucus   levothyroxine 50 MCG tablet Commonly known as:  SYNTHROID, LEVOTHROID Take 50 mcg by mouth daily before breakfast.   losartan 25 MG tablet Commonly known as:  COZAAR Take 25 mg by mouth daily.   nystatin-triamcinolone cream Commonly known as:  MYCOLOG II Apply 1 application topically 2 (two) times daily.   omeprazole 20 MG capsule Commonly known as:  PRILOSEC Take 20 mg by mouth daily.   oxybutynin 5 MG 24 hr tablet Commonly known as:  DITROPAN-XL Take 5 mg by mouth daily.   oxyCODONE 10 mg 12 hr tablet Commonly known as:  OXYCONTIN Take one tablet by mouth every morning for pain. Do not crush or chew.   oxyCODONE 5 MG immediate release tablet Commonly known as:  Oxy IR/ROXICODONE Take one tablet by mouth twice daily as needed for breakthrough pain   polyethylene glycol powder powder Commonly known as:  GLYCOLAX/MIRALAX Take 127.5 g by mouth daily. 17g once a day in liquid for constipation.   PRESCRIPTION MEDICATION 5-10 mLs by Mouth Rinse route every 4 (four) hours. Qdryl/[place]/ Lido mouth wash- pt to Rinse then spit.   SENEXON-S 8.6-50 MG tablet Generic drug:  senna-docusate Take 1 tablet by mouth every other day. And patient may take 1 tablet my mouth once daily as needed for constipation       Review of Systems  Immunization History  Administered Date(s) Administered  . Influenza-Unspecified 12/13/2013, 12/14/2014, 12/20/2015  . Pneumococcal Polysaccharide-23 03/10/2005  . Td 03/11/1999   Pertinent  Health  Maintenance Due  Topic Date Due  . PNA vac Low Risk Adult (2 of 2 - PCV13) 03/10/2006  . INFLUENZA VACCINE  Completed   Fall Risk  08/07/2015 08/07/2015 04/10/2015 03/27/2015  Falls in the past year? Yes No No No   Functional Status Survey:    Vitals:   02/26/16 1033  BP: (!) 129/97  Pulse: 60  Resp: 16  Temp: 98 F (36.7 C)  SpO2: 96%  Weight: 145 lb (65.8 kg)  Height: 5\' 9"  (1.753 m)   Body mass index is 21.41 kg/m. Physical Exam  Labs reviewed:  Recent Labs  01/07/16 0439 01/08/16 0446 01/08/16 1610 01/09/16 0459 01/17/16  NA 136 134*  --  134* 136*  K 4.1 4.5  --  4.3 4.8  CL 106 105  --  107  --   CO2 25 24  --  21*  --   GLUCOSE 101* 82  --  87  --   BUN 32* 24*  --  26* 23*  CREATININE 1.20 1.21  --  1.14 1.2  CALCIUM 8.1* 8.1*  --  7.8*  --   MG  --   --  1.8  --   --     Recent Labs  04/26/15 06/25/15  AST 18 17  ALT 8* 8*  ALKPHOS 107 107    Recent Labs  01/06/16 1935 01/07/16 0439 01/17/16  WBC 8.2 6.5 8.2  HGB 10.9* 9.4* 10.7*  HCT 33.2* 28.8* 32*  MCV 87.4 87.8  --   PLT 168 133* 235   Lab Results  Component Value Date   TSH 1.011 01/06/2016   No results found for: HGBA1C Lab Results  Component Value Date   CHOL 169 06/25/2015   HDL 51 06/25/2015   LDLCALC 102 06/25/2015   TRIG 82 06/25/2015    Significant Diagnostic Results in last 30 days:  No results found.  Assessment/Plan There are no diagnoses linked to this encounter.   Family/ staff Communication:   Labs/tests ordered:      This encounter was created in error - please disregard.

## 2016-03-04 DIAGNOSIS — R079 Chest pain, unspecified: Secondary | ICD-10-CM | POA: Diagnosis not present

## 2016-04-01 DIAGNOSIS — D692 Other nonthrombocytopenic purpura: Secondary | ICD-10-CM | POA: Diagnosis not present

## 2016-04-01 DIAGNOSIS — L218 Other seborrheic dermatitis: Secondary | ICD-10-CM | POA: Diagnosis not present

## 2016-04-01 DIAGNOSIS — L570 Actinic keratosis: Secondary | ICD-10-CM | POA: Diagnosis not present

## 2016-04-01 DIAGNOSIS — L821 Other seborrheic keratosis: Secondary | ICD-10-CM | POA: Diagnosis not present

## 2016-04-28 ENCOUNTER — Non-Acute Institutional Stay: Payer: Medicare PPO | Admitting: Internal Medicine

## 2016-04-28 DIAGNOSIS — I10 Essential (primary) hypertension: Secondary | ICD-10-CM | POA: Diagnosis not present

## 2016-04-28 DIAGNOSIS — D509 Iron deficiency anemia, unspecified: Secondary | ICD-10-CM | POA: Diagnosis not present

## 2016-04-28 DIAGNOSIS — E039 Hypothyroidism, unspecified: Secondary | ICD-10-CM

## 2016-04-28 DIAGNOSIS — N183 Chronic kidney disease, stage 3 unspecified: Secondary | ICD-10-CM

## 2016-04-28 DIAGNOSIS — R609 Edema, unspecified: Secondary | ICD-10-CM

## 2016-04-28 NOTE — Progress Notes (Signed)
Facility  [facility] Room Number: AL 32  Place of Service: ALF (13)     Allergies  Allergen Reactions  . Aleve [Naproxen]   . Indocin [Indomethacin]     Chief Complaint  Patient presents with  . Medical Management of Chronic Issues    HPI:  Iron deficiency anemia, unspecified iron deficiency anemia type - Hgb fell last year. Needs follow up  Essential hypertension - controllede  Edema, unspecified type - unchanged  Chronic kidney disease, stage III (moderate) - stable  Hypothyroidism, unspecified type - compensated.    Medications: Patient's Medications  New Prescriptions   No medications on file  Previous Medications   ACETAMINOPHEN (TYLENOL) 325 MG TABLET    Take 650 mg by mouth every 6 (six) hours as needed for mild pain, moderate pain or fever.   BENZOCAINE (ORAJEL) 10 % MUCOSAL GEL    Use as directed 1 application in the mouth or throat as needed for mouth pain.   CARBAMIDE PEROXIDE (DEBROX) 6.5 % OTIC SOLUTION    Instill 3-5 Drops into each ear once a week on Saturday and on Sunday at bedtime flush   CHOLECALCIFEROL (VITAMIN D) 1000 UNITS TABLET    Take 2,000 Units by mouth daily.    FINASTERIDE (PROSCAR) 5 MG TABLET    Take 5 mg by mouth daily.    GUAIFENESIN (ROBITUSSIN) 100 MG/5ML LIQUID    Take 10 CC by mouth every 4 hours as needed for cough or excessive mucus   LEVOTHYROXINE (SYNTHROID, LEVOTHROID) 50 MCG TABLET    Take 50 mcg by mouth daily before breakfast.    LOSARTAN (COZAAR) 25 MG TABLET    Take 25 mg by mouth daily.    NYSTATIN-TRIAMCINOLONE (MYCOLOG II) CREAM    Apply 1 application topically 2 (two) times daily.   OMEPRAZOLE (PRILOSEC) 20 MG CAPSULE    Take 20 mg by mouth daily.   OXYBUTYNIN (DITROPAN-XL) 5 MG 24 HR TABLET    Take 5 mg by mouth daily.    OXYCODONE (OXY IR/ROXICODONE) 5 MG IMMEDIATE RELEASE TABLET    Take one tablet by mouth twice daily as needed for breakthrough pain   OXYCODONE (OXYCONTIN) 10 MG 12 HR TABLET    Take one  tablet by mouth every morning for pain. Do not crush or chew.   POLYETHYLENE GLYCOL POWDER (GLYCOLAX/MIRALAX) POWDER    Take 127.5 g by mouth daily. 17g once a day in liquid for constipation.   POLYVINYL ALCOHOL (ARTIFICIAL TEARS) 1.4 % OPHTHALMIC SOLUTION    Place 1 drop into both eyes 2 (two) times daily.    PRESCRIPTION MEDICATION    5-10 mLs by Mouth Rinse route every 4 (four) hours. Qdryl/Alma/ Lido mouth wash- pt to Rinse then spit.   SENNA-DOCUSATE (SENEXON-S) 8.6-50 MG PER TABLET    Take 1 tablet by mouth every other day. And patient may take 1 tablet my mouth once daily as needed for constipation  Modified Medications   No medications on file  Discontinued Medications   No medications on file     Review of Systems  Constitutional: Negative for fever.       Elderly. Frail.  HENT: Positive for hearing loss.        Xerostomia Dental discomfort right mandible.  Eyes: Negative.  Negative for discharge and redness.  Respiratory: Positive for cough and chest tightness. Negative for shortness of breath and wheezing.   Cardiovascular: Positive for leg swelling. Negative for chest pain and palpitations.  Gastrointestinal: Negative for abdominal distention, abdominal pain, anal bleeding, blood in stool, constipation, diarrhea, nausea and vomiting.       Hx gastric ulcer and GERD.  Endocrine:       Hypothyroid  Genitourinary: Positive for frequency.       CKD 2  Musculoskeletal: Positive for arthralgias (chronic shoulder pains), back pain and gait problem. Negative for joint swelling, myalgias, neck pain and neck stiffness.  Skin: Positive for pallor. Negative for rash.  Allergic/Immunologic: Negative.   Neurological: Negative for weakness.       On and off tinglings in the left fingers-can be relieved by touching his forehead with left fingers.   Psychiatric/Behavioral: Negative for agitation, behavioral problems, confusion, hallucinations and sleep disturbance. The patient is not  nervous/anxious and is not hyperactive.     Vitals:   04/28/16 1325  BP: (!) 152/80  Pulse: 71  Resp: 16  Temp: 98 F (36.7 C)  SpO2: 95%  Weight: 144 lb (65.3 kg)  Height: '5\' 9"'$  (1.753 m)   Wt Readings from Last 3 Encounters:  04/28/16 144 lb (65.3 kg)  02/26/16 145 lb (65.8 kg)  01/18/16 147 lb (66.7 kg)    Body mass index is 21.27 kg/m.  Physical Exam  Constitutional: He is oriented to person, place, and time. He appears well-developed and well-nourished. No distress.  HENT:  Right Ear: External ear normal.  Left Ear: External ear normal.  Nose: Nose normal.  Mouth/Throat: Oropharynx is clear and moist. No oropharyngeal exudate.  Severe bilateral hearing loss  Eyes: Conjunctivae and EOM are normal. Pupils are equal, round, and reactive to light.  Neck: No JVD present. No tracheal deviation present. No thyromegaly present.  Cardiovascular: Regular rhythm and intact distal pulses.  Exam reveals no gallop and no friction rub.   No murmur heard. Bradycardia at 60 bpm. 3/6 systolic ejection murmur at aortic area.  Pulmonary/Chest: No respiratory distress. He has no wheezes. He has no rales. He exhibits no tenderness.  Abdominal: He exhibits no distension and no mass. There is no tenderness.  Musculoskeletal: He exhibits edema (1+ bilaterally in lower legs and feet). He exhibits no tenderness.  Tender in the low back with percussion. Unstable when standing. Rides in a wheelchair.   Lymphadenopathy:    He has no cervical adenopathy.  Neurological: He is alert and oriented to person, place, and time. He has normal reflexes. No cranial nerve deficit. Coordination normal.  Skin: No rash noted. No erythema. There is pallor.  Psychiatric: He has a normal mood and affect. His behavior is normal. Judgment and thought content normal.     Labs reviewed: Lab Summary Latest Ref Rng & Units 01/17/2016 01/09/2016 01/08/2016 01/07/2016 01/06/2016  Hemoglobin 13.5 - 17.5 g/dL 10.7(A)  (None) (None) 9.4(L) 10.9(L)  Hematocrit 41 - 53 % 32(A) (None) (None) 28.8(L) 33.2(L)  White count 10:3/mL 8.2 (None) (None) 6.5 8.2  Platelet count 150 - 399 K/L 235 (None) (None) 133(L) 168  Sodium 137 - 147 mmol/L 136(A) 134(L) 134(L) 136 136  Potassium 3.4 - 5.3 mmol/L 4.8 4.3 4.5 4.1 4.8  Calcium 8.9 - 10.3 mg/dL (None) 7.8(L) 8.1(L) 8.1(L) 8.3(L)  Phosphorus - (None) (None) (None) (None) (None)  Creatinine 0.6 - 1.3 mg/dL 1.2 1.14 1.21 1.20 1.37(H)  AST - (None) (None) (None) (None) (None)  Alk Phos - (None) (None) (None) (None) (None)  Bilirubin - (None) (None) (None) (None) (None)  Glucose mg/dL 102 87 82 101(H) 115(H)  Cholesterol - (None) (None) (None) (None) (  None)  HDL cholesterol - (None) (None) (None) (None) (None)  Triglycerides - (None) (None) (None) (None) (None)  LDL Direct - (None) (None) (None) (None) (None)  LDL Calc - (None) (None) (None) (None) (None)  Total protein - (None) (None) (None) (None) (None)  Albumin - (None) (None) (None) (None) (None)  Some recent data might be hidden   Lab Results  Component Value Date   TSH 1.011 01/06/2016   Lab Results  Component Value Date   BUN 23 (A) 01/17/2016   BUN 26 (H) 01/09/2016   BUN 24 (H) 01/08/2016   Lab Results  Component Value Date   CREATININE 1.2 01/17/2016   CREATININE 1.14 01/09/2016   CREATININE 1.21 01/08/2016   No results found for: HGBA1C     Assessment/Plan  1. Iron deficiency anemia, unspecified iron deficiency anemia type - CBC  2. Essential hypertension -BMP  3. Edema, unspecified type - unchanged  4. Chronic kidney disease, stage III (moderate) -BMP  5. Hypothyroidism, unspecified type -TSH

## 2016-05-01 DIAGNOSIS — D509 Iron deficiency anemia, unspecified: Secondary | ICD-10-CM | POA: Diagnosis not present

## 2016-05-01 DIAGNOSIS — E039 Hypothyroidism, unspecified: Secondary | ICD-10-CM | POA: Diagnosis not present

## 2016-05-01 DIAGNOSIS — N182 Chronic kidney disease, stage 2 (mild): Secondary | ICD-10-CM | POA: Diagnosis not present

## 2016-05-01 LAB — BASIC METABOLIC PANEL
BUN: 32 mg/dL — AB (ref 4–21)
Creatinine: 1.3 mg/dL (ref <=1.3)
GLUCOSE: 86 mg/dL
Potassium: 4.9 mmol/L (ref 3.4–5.3)
Sodium: 138 mmol/L (ref 137–147)

## 2016-05-01 LAB — TSH: TSH: 2.42 u[IU]/mL (ref <=5.90)

## 2016-05-01 LAB — CBC AND DIFFERENTIAL
HEMATOCRIT: 34 % — AB (ref 41–53)
HEMOGLOBIN: 10.8 g/dL — AB (ref 13.5–17.5)
PLATELETS: 178 10*3/uL (ref 150–399)
WBC: 7.1 10*3/mL

## 2016-05-02 ENCOUNTER — Other Ambulatory Visit: Payer: Self-pay | Admitting: *Deleted

## 2016-05-06 DIAGNOSIS — L570 Actinic keratosis: Secondary | ICD-10-CM | POA: Diagnosis not present

## 2016-05-06 DIAGNOSIS — L821 Other seborrheic keratosis: Secondary | ICD-10-CM | POA: Diagnosis not present

## 2016-05-06 DIAGNOSIS — L218 Other seborrheic dermatitis: Secondary | ICD-10-CM | POA: Diagnosis not present

## 2016-05-23 DIAGNOSIS — H6121 Impacted cerumen, right ear: Secondary | ICD-10-CM | POA: Diagnosis not present

## 2016-05-23 DIAGNOSIS — H60331 Swimmer's ear, right ear: Secondary | ICD-10-CM | POA: Diagnosis not present

## 2016-05-27 ENCOUNTER — Encounter: Payer: Self-pay | Admitting: Nurse Practitioner

## 2016-05-27 NOTE — Progress Notes (Signed)
This encounter was created in error - please disregard.

## 2016-08-25 ENCOUNTER — Non-Acute Institutional Stay: Payer: Medicare PPO | Admitting: Internal Medicine

## 2016-08-25 ENCOUNTER — Encounter: Payer: Self-pay | Admitting: Internal Medicine

## 2016-08-25 DIAGNOSIS — M545 Low back pain, unspecified: Secondary | ICD-10-CM

## 2016-08-25 DIAGNOSIS — M546 Pain in thoracic spine: Secondary | ICD-10-CM | POA: Diagnosis not present

## 2016-08-25 NOTE — Progress Notes (Signed)
Location:  [facility] Room Number: 32 Place of Service:  ALF [id number]) Provider:    Oneal Grout, MD  Patient Care Team: Oneal Grout, MD as PCP - General (Internal Medicine) Ngetich, Donalee Citrin, NP as Nurse Practitioner (Family Medicine)  Extended Emergency Contact Information Primary Emergency Contact: [name], [place] Home Phone: [phone] Relation: Son Secondary Emergency Contact: [name] [place] Home Phone: [phone] Relation: Grandson  Code Status:  DNR  Goals of care: Advanced Directive information Advanced Directives 08/25/2016  Does Patient Have a Medical Advance Directive? Yes  Type of Estate agent of [place];Living will;Out of facility DNR (pink MOST or yellow form)  Does patient want to make changes to medical advance directive? -  Copy of Healthcare Power of Attorney in Chart? Yes  Pre-existing out of facility DNR order (yellow form or pink MOST form) Pink MOST form placed in chart (order not valid for inpatient use)     Chief Complaint  Patient presents with  . Acute Visit    Back Pain    HPI:  Pt is a 83 y.o. male seen today for an acute visit for back pain. He has chronic back pain and this Saturday he had gone out with his family. He did not have his motorized wheelchair with him and had to use his walker to walk. He walked through uneven path and sat in uncomfortable chair for an hour. Following this he started complaining of worsening back pain. He received pain medication this am with some relief. This history is provided by his daughter in law who is at bedside. He is very hard of hearing and has a hearing aid. He has history of AAA s/p repair 2012. He denies any radiation of his pain to his legs. He denies any bowel/ bladder incontinence that is new.    Past Medical History:  Diagnosis Date  . AAA (abdominal aortic aneurysm) (HCC)     . Abnormality of gait 08/01/2010  . Adhesive capsulitis of shoulder 08/01/2010   "Frozen" right shoulder  . Anemia   . Anxiety state, unspecified 08/01/2010  . Arthritis   . Congestive heart failure, unspecified 08/01/2010  . Cough 08/07/2015  . Disorders of bursae and tendons in shoulder region, unspecified 08/01/2010  . Edema 08/01/2010  . Fall   . Fecal impaction (HCC)   . GERD (gastroesophageal reflux disease)   . Hiatal hernia   . Hypercalcemia   . Hyperlipidemia   . Hypertension   . Hypertrophy of prostate without urinary obstruction and other lower urinary tract symptoms (LUTS) 08/01/2010  . Hyponatremia   . Hypothyroidism   . Infectious colitis   . Insomnia, unspecified 08/01/2010  . Joint pain   . Leg pain    with walking  . Lumbago 12/24/2010  . Orthostatic hypotension 11/05/2010  . Other specified cardiac dysrhythmias(427.89) 12/24/2010  . Pain in joint, pelvic region and thigh 08/06/2010  . Peptic ulcer, unspecified site, unspecified as acute or chronic, without mention of hemorrhage, perforation, or obstruction 08/01/2010  . Renal failure, acute (HCC)   . Swelling, mass, or lump in head and neck 01/03/2011  . Ulcer    Stomach  . Unspecified disorder of kidney and ureter 08/01/2010  . Unspecified hearing loss 08/01/2010  . Unspecified vitamin D deficiency 08/06/2010  . Urinary frequency 09/13/2010  . Vertigo    Past Surgical History:  Procedure Laterality Date  .  ABDOMINAL AORTIC ANEURYSM REPAIR  12-18-10   Stent graft repair of AAA  . APPENDECTOMY    . CATARACT EXTRACTION    . HEMORRHOID SURGERY  2000  . INGUINAL HERNIA REPAIR Left 2005  . TRANSTHORACIC ECHOCARDIOGRAM  07/19/2010    Left ventricle: There is hypokinesis of the inferior wall and  posterior wall. The EF is 35-40%. The cavity size was normal    Allergies  Allergen Reactions  . Aleve [Naproxen]   . Indocin [Indomethacin]     Outpatient Encounter Prescriptions as of 08/25/2016  Medication Sig  .  acetaminophen (TYLENOL) 325 MG tablet Take 650 mg by mouth every 6 (six) hours as needed for mild pain, moderate pain or fever.  . benzocaine (ORAJEL) 10 % mucosal gel Use as directed 1 application in the mouth or throat as needed for mouth pain.  . carbamide peroxide (DEBROX) 6.5 % otic solution Instill 3-5 Drops into each ear once a week on Saturday and on Sunday at bedtime flush  . cholecalciferol (VITAMIN D) 1000 UNITS tablet Take 2,000 Units by mouth daily.   . finasteride (PROSCAR) 5 MG tablet Take 5 mg by mouth daily.   . fluocinonide (LIDEX) 0.05 % external solution Apply 1 application topically as needed.  Marland Kitchen. guaiFENesin (ROBITUSSIN) 100 MG/5ML liquid Take 10 CC by mouth every 4 hours as needed for cough or excessive mucus  . ketoconazole (NIZORAL) 2 % shampoo Apply 1 application topically once a week.  . levothyroxine (SYNTHROID, LEVOTHROID) 50 MCG tablet Take 50 mcg by mouth daily before breakfast.   . losartan (COZAAR) 25 MG tablet Take 25 mg by mouth daily.   . NON FORMULARY Take 1 capsule by mouth as directed. Beneflex--may self-administer and keep at bedside  . omeprazole (PRILOSEC) 20 MG capsule Take 20 mg by mouth daily.  Marland Kitchen. oxybutynin (DITROPAN-XL) 5 MG 24 hr tablet Take 5 mg by mouth daily.   Marland Kitchen. oxyCODONE (OXY IR/ROXICODONE) 5 MG immediate release tablet Take one tablet by mouth twice daily as needed for breakthrough pain  . oxyCODONE (OXYCONTIN) 10 mg 12 hr tablet Take one tablet by mouth every morning for pain. Do not crush or chew.  . polyethylene glycol powder (GLYCOLAX/MIRALAX) powder Take 127.5 g by mouth daily. 17g once a day in liquid for constipation.  . polyvinyl alcohol (ARTIFICIAL TEARS) 1.4 % ophthalmic solution Place 1 drop into both eyes 2 (two) times daily.   Marland Kitchen. senna-docusate (SENEXON-S) 8.6-50 MG per tablet Take 1 tablet by mouth every other day. And patient may take 1 tablet my mouth once daily as needed for constipation  . triamcinolone (KENALOG) 0.1 % paste Use  as directed 1 application in the mouth or throat as directed. 3-6 times daily  . [DISCONTINUED] nystatin-triamcinolone (MYCOLOG II) cream Apply 1 application topically 2 (two) times daily.  . [DISCONTINUED] PRESCRIPTION MEDICATION 5-10 mLs by Mouth Rinse route every 4 (four) hours. Qdryl/Alma/ Lido mouth wash- pt to Rinse then spit.   No facility-administered encounter medications on file as of 08/25/2016.     Review of Systems  Constitutional: Negative for appetite change, chills, diaphoresis and fever.  HENT: Positive for hearing loss. Negative for rhinorrhea.   Eyes: Negative for visual disturbance.  Respiratory: Negative for cough and shortness of breath.   Cardiovascular: Negative for chest pain, palpitations and leg swelling.  Gastrointestinal: Negative for abdominal distention, abdominal pain, nausea and vomiting.  Genitourinary: Negative for dysuria.  Musculoskeletal: Positive for back pain. Negative for joint swelling.  Skin: Negative  for rash.  Neurological: Negative for dizziness, weakness, light-headedness and numbness.  Psychiatric/Behavioral: Negative for behavioral problems.    Immunization History  Administered Date(s) Administered  . Influenza-Unspecified 12/27/2010, 12/29/2011, 12/27/2012, 12/13/2013, 12/14/2014, 12/20/2015  . PPD Test 07/30/2010, 08/14/2010  . Pneumococcal Polysaccharide-23 03/10/2005  . Td 03/11/1999   Pertinent  Health Maintenance Due  Topic Date Due  . PNA vac Low Risk Adult (2 of 2 - PCV13) 03/10/2006  . INFLUENZA VACCINE  10/08/2016   Fall Risk  08/07/2015 08/07/2015 04/10/2015 03/27/2015  Falls in the past year? Yes No No No   Functional Status Survey:    Vitals:   08/25/16 1423  BP: 137/69  Pulse: 60  Resp: 16  Temp: 97.6 F (36.4 C)  SpO2: 97%  Weight: 139 lb 8 oz (63.3 kg)  Height: 5\' 9"  (1.753 m)   Body mass index is 20.6 kg/m. Physical Exam  Constitutional: He is oriented to person, place, and time.  Thin built, frail,  elderly male in no acute distress  HENT:  Head: Normocephalic and atraumatic.  Eyes: Conjunctivae are normal. Pupils are equal, round, and reactive to light.  Neck: Normal range of motion. Neck supple.  Cardiovascular: Normal rate and regular rhythm.   Murmur heard. Pulmonary/Chest: Effort normal and breath sounds normal. He has no wheezes.  Abdominal: Soft. Bowel sounds are normal. He exhibits no distension and no mass. There is no tenderness. There is no guarding.  No pulsatile mass  Musculoskeletal: He exhibits no edema.  Tenderness to lumbar spine on palpation with tenderness to paravertebral area as well, scoliosis present, guarded step during transfer from his motorized wheelchair to bed.   Lymphadenopathy:    He has no cervical adenopathy.  Neurological: He is alert and oriented to person, place, and time.  Skin: Skin is warm and dry. He is not diaphoretic.  Psychiatric: He has a normal mood and affect.    Labs reviewed:  Recent Labs  01/07/16 0439 01/08/16 0446 01/08/16 1610 01/09/16 0459 01/17/16 05/01/16  NA 136 134*  --  134* 136* 138  K 4.1 4.5  --  4.3 4.8 4.9  CL 106 105  --  107  --   --   CO2 25 24  --  21*  --   --   GLUCOSE 101* 82  --  87  --   --   BUN 32* 24*  --  26* 23* 32*  CREATININE 1.20 1.21  --  1.14 1.2 1.3  CALCIUM 8.1* 8.1*  --  7.8*  --   --   MG  --   --  1.8  --   --   --    No results for input(s): AST, ALT, ALKPHOS, BILITOT, PROT, ALBUMIN in the last 8760 hours.  Recent Labs  01/06/16 1935 01/07/16 0439 01/17/16 05/01/16  WBC 8.2 6.5 8.2 7.1  HGB 10.9* 9.4* 10.7* 10.8*  HCT 33.2* 28.8* 32* 34*  MCV 87.4 87.8  --   --   PLT 168 133* 235 178   Lab Results  Component Value Date   TSH 2.42 05/01/2016   No results found for: HGBA1C Lab Results  Component Value Date   CHOL 169 06/25/2015   HDL 51 06/25/2015   LDLCALC 102 06/25/2015   TRIG 82 06/25/2015    Significant Diagnostic Results in last 30 days:  No results  found.  Assessment/Plan  Acute back pain Has acute on chronic back pain. No fall or trauma. Pain medication has  helped relieve pain. Given his walk (distance more than normal days), uneven surface and being on uncomfortable chair along with history of chronic back pain, OA and age, likely has musculoskeletal pain. On exam, reproducible pain present. Get xray spine to rule out fracture/ dislocation. Currently on oxyIR 10 mg daily with oxycodone 5 mg bid prn and tylenol 650 mg q6h prn. Add lidocaine 5% patch. Change tylenol to 1000 mg bid for 1 week. Continue daily scheduled oxycodone 10 mg for now and monitor. Rest and ice pack prn and monitor. No nausea, vomiting, dysuria making GI/GU symptom less likely. No palpable/ pulsatile mass and hemodynamically stable making complications with AAA less likely at present. Monitor for worsening symptom. Check vital signs bid x 1 week.    Family/ staff Communication: reviewed care plan with patient and charge nurse.    Labs/tests ordered:  Xray thoracic and lumbar spine.   Oneal Grout, MD  [facility] [phone] (Monday-Friday 8 am - 5 pm) [phone] (afterhours)

## 2016-08-25 NOTE — Addendum Note (Signed)
Addended by: Jove Beyl on: 08/25/2016 06:04 PM   Modules accepted: Level of Service  

## 2016-09-16 ENCOUNTER — Non-Acute Institutional Stay: Payer: Medicare PPO | Admitting: Family

## 2016-09-16 ENCOUNTER — Encounter: Payer: Self-pay | Admitting: Family

## 2016-09-16 DIAGNOSIS — M159 Polyosteoarthritis, unspecified: Secondary | ICD-10-CM

## 2016-09-16 DIAGNOSIS — H6123 Impacted cerumen, bilateral: Secondary | ICD-10-CM | POA: Diagnosis not present

## 2016-09-16 DIAGNOSIS — I10 Essential (primary) hypertension: Secondary | ICD-10-CM | POA: Diagnosis not present

## 2016-09-16 DIAGNOSIS — G8929 Other chronic pain: Secondary | ICD-10-CM

## 2016-09-16 DIAGNOSIS — M545 Low back pain, unspecified: Secondary | ICD-10-CM

## 2016-09-16 DIAGNOSIS — N400 Enlarged prostate without lower urinary tract symptoms: Secondary | ICD-10-CM | POA: Diagnosis not present

## 2016-09-16 DIAGNOSIS — M150 Primary generalized (osteo)arthritis: Secondary | ICD-10-CM | POA: Diagnosis not present

## 2016-09-16 DIAGNOSIS — E039 Hypothyroidism, unspecified: Secondary | ICD-10-CM | POA: Diagnosis not present

## 2016-09-16 DIAGNOSIS — K5901 Slow transit constipation: Secondary | ICD-10-CM

## 2016-09-16 NOTE — Progress Notes (Signed)
Location:  [facility] Room Number: 32 Place of Service:  ALF [phone] Provider: Shenita Trego FNP-C   Oneal GroutPandey, Mahima, MD  Patient Care Team: Oneal GroutPandey, Mahima, MD as PCP - General (Internal Medicine) Srija Southard, Donalee Citrininah C, NP as Nurse Practitioner (Family Medicine)  Extended Emergency Contact Information Primary Emergency Contact: [name],Phillip          [place] Home Phone: [phone] Relation: Son Secondary Emergency Contact: [name],[name]  [place] Home Phone: [phone] Relation: Grandson  Code Status:  Full code  Goals of care: Advanced Directive information Advanced Directives 09/16/2016  Does Patient Have a Medical Advance Directive? Yes  Type of Advance Directive Out of facility DNR (pink MOST or yellow form);Living will;Healthcare Power of Attorney  Does patient want to make changes to medical advance directive? -  Copy of Healthcare Power of Attorney in Chart? Yes  Pre-existing out of facility DNR order (yellow form or pink MOST form) Pink MOST form placed in chart (order not valid for inpatient use)     Chief Complaint  Patient presents with  . Medical Management of Chronic Issues    routine visit    HPI:  Pt is a [age] y.o. male seen today [facility] for medical management of chronic diseases.He has a medical history of HTN,BPH,Hypothyroidism,chronic back pain, OA among other conditions. He is seen in his room today. He complains of chronic back pain and Osteoarthritic pain.He states current pain killers eases off the pain. No recent fall episodes.Had previous weight loss but weight log reviewed seems to be stable for the past three months.    Past Medical History:  Diagnosis Date  . AAA (abdominal aortic aneurysm) (HCC)   . Abnormality of gait 08/01/2010  . Adhesive capsulitis of shoulder 08/01/2010   "Frozen" right shoulder  . Anemia   . Anxiety state, unspecified 08/01/2010  . Arthritis   .  Congestive heart failure, unspecified 08/01/2010  . Cough 08/07/2015  . Disorders of bursae and tendons in shoulder region, unspecified 08/01/2010  . Edema 08/01/2010  . Fall   . Fecal impaction (HCC)   . GERD (gastroesophageal reflux disease)   . Hiatal hernia   . Hypercalcemia   . Hyperlipidemia   . Hypertension   . Hypertrophy of prostate without urinary obstruction and other lower urinary tract symptoms (LUTS) 08/01/2010  . Hyponatremia   . Hypothyroidism   . Infectious colitis   . Insomnia, unspecified 08/01/2010  . Joint pain   . Leg pain    with walking  . Lumbago 12/24/2010  . Orthostatic hypotension 11/05/2010  . Other specified cardiac dysrhythmias(427.89) 12/24/2010  . Pain in joint, pelvic region and thigh 08/06/2010  . Peptic ulcer, unspecified site, unspecified as acute or chronic, without mention of hemorrhage, perforation, or obstruction 08/01/2010  . Renal failure, acute (HCC)   . Swelling, mass, or lump in head and neck 01/03/2011  . Ulcer    Stomach  . Unspecified disorder of kidney and ureter 08/01/2010  . Unspecified hearing loss 08/01/2010  . Unspecified vitamin D deficiency 08/06/2010  . Urinary frequency 09/13/2010  . Vertigo    Past Surgical History:  Procedure Laterality Date  . ABDOMINAL AORTIC ANEURYSM REPAIR  12-18-10   Stent graft repair of AAA  . APPENDECTOMY    . CATARACT EXTRACTION    . HEMORRHOID SURGERY  2000  . INGUINAL HERNIA REPAIR Left 2005  . TRANSTHORACIC ECHOCARDIOGRAM  07/19/2010    Left ventricle: There  is hypokinesis of the inferior wall and  posterior wall. The EF is 35-40%. The cavity size was normal    Allergies  Allergen Reactions  . Aleve [Naproxen]   . Indocin [Indomethacin]     Allergies as of 09/16/2016      Reactions   Aleve [naproxen]    Indocin [indomethacin]       Medication List       Accurate as of 09/16/16  9:38 PM. Always use your most recent med list.          acetaminophen 325 MG tablet Commonly known as:   TYLENOL Take 650 mg by mouth every 6 (six) hours as needed for mild pain, moderate pain or fever.   ARTIFICIAL TEARS 1.4 % ophthalmic solution Generic drug:  polyvinyl alcohol Place 1 drop into both eyes 2 (two) times daily.   ASPERCREME LIDOCAINE 4 % Ptch Generic drug:  Lidocaine Apply 1 patch topically daily. Leave on for 12 hours   benzocaine 10 % mucosal gel Commonly known as:  ORAJEL Use as directed 1 application in the mouth or throat as needed for mouth pain.   carbamide peroxide 6.5 % OTIC solution Commonly known as:  DEBROX Instill 3-5 Drops into each ear once a week on Saturday and on Sunday at bedtime flush   cholecalciferol 1000 units tablet Commonly known as:  VITAMIN D Take 2,000 Units by mouth daily.   finasteride 5 MG tablet Commonly known as:  PROSCAR Take 5 mg by mouth daily.   fluocinonide 0.05 % external solution Commonly known as:  LIDEX Apply 1 application topically as needed.   guaiFENesin 100 MG/5ML liquid Commonly known as:  ROBITUSSIN Take 10 CC by mouth every 4 hours as needed for cough or excessive mucus   ketoconazole 2 % shampoo Commonly known as:  NIZORAL Apply 1 application topically once a week.   levothyroxine 50 MCG tablet Commonly known as:  SYNTHROID, LEVOTHROID Take 50 mcg by mouth daily before breakfast.   losartan 25 MG tablet Commonly known as:  COZAAR Take 25 mg by mouth daily.   NON FORMULARY Take 1 capsule by mouth as directed. Beneflex--may self-administer and keep at bedside   omeprazole 20 MG capsule Commonly known as:  PRILOSEC Take 20 mg by mouth daily.   oxybutynin 5 MG 24 hr tablet Commonly known as:  DITROPAN-XL Take 5 mg by mouth daily.   oxyCODONE 10 mg 12 hr tablet Commonly known as:  OXYCONTIN Take one tablet by mouth every morning for pain. Do not crush or chew.   oxyCODONE 5 MG immediate release tablet Commonly known as:  Oxy IR/ROXICODONE Take one tablet by mouth twice daily as needed for  breakthrough pain   polyethylene glycol powder powder Commonly known as:  GLYCOLAX/MIRALAX Take 127.5 g by mouth daily. 17g once a day in liquid for constipation.   SENEXON-S 8.6-50 MG tablet Generic drug:  senna-docusate Take 1 tablet by mouth at bedtime as needed. And patient may take 1 tablet my mouth once daily as needed for constipation   triamcinolone 0.1 % paste Commonly known as:  KENALOG Use as directed 1 application in the mouth or throat as directed. 3-6 times daily       Review of Systems  Constitutional: Negative for activity change, appetite change, chills, fatigue and fever.  HENT: Positive for hearing loss. Negative for congestion, ear pain, rhinorrhea, sinus pain, sinus pressure, sneezing and sore throat.   Eyes: Negative.  Wears eye glasses  Respiratory: Negative for cough, chest tightness, shortness of breath and wheezing.   Cardiovascular: Negative for chest pain, palpitations and leg swelling.  Gastrointestinal: Negative for abdominal distention, abdominal pain, constipation, diarrhea, nausea and vomiting.  Endocrine: Negative.   Genitourinary: Negative for dysuria, flank pain, frequency and urgency.  Musculoskeletal: Positive for arthralgias, back pain and gait problem.  Skin: Negative for color change, pallor and rash.  Neurological: Negative for dizziness, tremors, seizures, light-headedness and headaches.  Hematological: Does not bruise/bleed easily.  Psychiatric/Behavioral: Negative for agitation, confusion, hallucinations and sleep disturbance. The patient is not nervous/anxious.     Immunization History  Administered Date(s) Administered  . Influenza-Unspecified 12/27/2010, 12/29/2011, 12/27/2012, 12/13/2013, 12/14/2014, 12/20/2015  . PPD Test 07/30/2010, 08/14/2010  . Pneumococcal Polysaccharide-23 03/10/2005  . Td 03/11/1999   Pertinent  Health Maintenance Due  Topic Date Due  . PNA vac Low Risk Adult (2 of 2 - PCV13) 03/10/2006  .  INFLUENZA VACCINE  10/08/2016   Fall Risk  08/07/2015 08/07/2015 04/10/2015 03/27/2015  Falls in the past year? Yes No No No    Vitals:   09/16/16 1148  BP: 112/76  Pulse: 86  Resp: (!) 21  Temp: 97.6 F (36.4 C)  SpO2: 97%  Weight: 139 lb (63 kg)  Height: 5\' 9"  (1.753 m)   Body mass index is 20.53 kg/m. Physical Exam  Constitutional: He is oriented to person, place, and time.  Thin frail pleasant elderly in no acute distress  HENT:  Head: Normocephalic.  Mouth/Throat: Oropharynx is clear and moist. No oropharyngeal exudate.  HOH wears hearing aids. Bilateral cerumen impaction   Eyes: Conjunctivae and EOM are normal. Pupils are equal, round, and reactive to light. Right eye exhibits no discharge. Left eye exhibits no discharge. No scleral icterus.  Neck: Normal range of motion. No JVD present. No thyromegaly present.  Cardiovascular: Intact distal pulses.  Exam reveals no gallop and no friction rub.   Murmur heard. Pulmonary/Chest: Effort normal and breath sounds normal. No respiratory distress. He has no wheezes. He has no rales.  Abdominal: Soft. Bowel sounds are normal. He exhibits no distension. There is no tenderness. There is no rebound and no guarding.  Musculoskeletal: He exhibits no edema or tenderness.  Unsteady uses power wheelchair.   Lymphadenopathy:    He has no cervical adenopathy.  Neurological: He is oriented to person, place, and time.  Skin: Skin is warm and dry. No rash noted. No erythema. No pallor.  Psychiatric: He has a normal mood and affect.    Labs reviewed:  Recent Labs  01/07/16 0439 01/08/16 0446 01/08/16 1610 01/09/16 0459 01/17/16 05/01/16  NA 136 134*  --  134* 136* 138  K 4.1 4.5  --  4.3 4.8 4.9  CL 106 105  --  107  --   --   CO2 25 24  --  21*  --   --   GLUCOSE 101* 82  --  87  --   --   BUN 32* 24*  --  26* 23* 32*  CREATININE 1.20 1.21  --  1.14 1.2 1.3  CALCIUM 8.1* 8.1*  --  7.8*  --   --   MG  --   --  1.8  --   --   --       Recent Labs  01/06/16 1935 01/07/16 0439 01/17/16 05/01/16  WBC 8.2 6.5 8.2 7.1  HGB 10.9* 9.4* 10.7* 10.8*  HCT 33.2* 28.8* 32* 34*  MCV 87.4 87.8  --   --   PLT 168 133* 235 178   Lab Results  Component Value Date   TSH 2.42 05/01/2016   No results found for: HGBA1C Lab Results  Component Value Date   CHOL 169 06/25/2015   HDL 51 06/25/2015   LDLCALC 102 06/25/2015   TRIG 82 06/25/2015    Significant Diagnostic Results in last 30 days:  No results found.  Assessment/Plan 1. Essential hypertension B/p stable.continue on Losartan 25 mg tablet daily. Continue to monitor.    2. Hypothyroidism Lab Results  Component Value Date   TSH 2.42 05/01/2016  Continue on Levothyroxine 50 mcg tablet daily. Recheck TSH level next visit.   3. Chronic bilateral low back pain without sciatica Continue current pain medication. Continue to monitor for sedation.   4. Primary osteoarthritis involving multiple joints Continue lidocaine patch and Tylenol as needed.   5. Benign prostatic hyperplasia without lower urinary tract symptoms Asymptomatic. Continue on finasteride 5 mg tablet and oxybutynin 5 mg 24 Hr tablet. Continue to monitor for urinary retention.   6. Slow transit constipation Current regimen effective. Continue to encourage oral intake and hydration.   7. Bilateral impacted cerumen Start debrox 6.5 % instil 5 drops twice daily x 4 days then lavage with warm water.   Family/ staff Communication: Reviewed plan of care with patient and facility Nurse supervisor   Labs/tests ordered: None   Caesar Bookman, NP

## 2016-09-24 DIAGNOSIS — H6121 Impacted cerumen, right ear: Secondary | ICD-10-CM | POA: Diagnosis not present

## 2016-10-14 DIAGNOSIS — M5416 Radiculopathy, lumbar region: Secondary | ICD-10-CM | POA: Diagnosis not present

## 2016-10-14 DIAGNOSIS — M5136 Other intervertebral disc degeneration, lumbar region: Secondary | ICD-10-CM | POA: Diagnosis not present

## 2016-10-14 DIAGNOSIS — Z4689 Encounter for fitting and adjustment of other specified devices: Secondary | ICD-10-CM | POA: Diagnosis not present

## 2016-10-14 DIAGNOSIS — M47896 Other spondylosis, lumbar region: Secondary | ICD-10-CM | POA: Diagnosis not present

## 2016-10-14 DIAGNOSIS — M545 Low back pain: Secondary | ICD-10-CM | POA: Diagnosis not present

## 2016-10-27 ENCOUNTER — Other Ambulatory Visit: Payer: Self-pay | Admitting: *Deleted

## 2016-10-27 MED ORDER — OXYCODONE HCL ER 10 MG PO T12A: EXTENDED_RELEASE_TABLET | ORAL | 0 refills | Status: DC

## 2016-10-29 ENCOUNTER — Non-Acute Institutional Stay: Payer: Medicare PPO

## 2016-10-29 DIAGNOSIS — Z0000 Encounter for general adult medical examination without abnormal findings: Secondary | ICD-10-CM

## 2016-10-29 NOTE — Progress Notes (Signed)
Subjective:   Ricardo [name] is a 83 y.o. male who presents for an Initial Medicare Annual Wellness Visit at [facility]; incapacitated patient unable to answer questions appropriately    Objective:    Today's Vitals   10/29/16 0912  BP: (!) 128/58  Pulse: (!) 54  Temp: (!) 97.4 F (36.3 C)  TempSrc: Oral  SpO2: 93%  Weight: 139 lb (63 kg)  Height: 5\' 9"  (1.753 m)   Body mass index is 20.53 kg/m.  Current Medications (verified) Outpatient Encounter Prescriptions as of 10/29/2016  Medication Sig  . benzocaine (ORAJEL) 10 % mucosal gel Use as directed 1 application in the mouth or throat as needed for mouth pain.  . cholecalciferol (VITAMIN D) 1000 UNITS tablet Take 2,000 Units by mouth daily.   . finasteride (PROSCAR) 5 MG tablet Take 5 mg by mouth daily.   . fluocinonide (LIDEX) 0.05 % external solution Apply 1 application topically as needed.  . gabapentin (NEURONTIN) 100 MG capsule Take 100 mg by mouth at bedtime.  Marland Kitchen guaiFENesin (ROBITUSSIN) 100 MG/5ML liquid Take 10 CC by mouth every 4 hours as needed for cough or excessive mucus  . ketoconazole (NIZORAL) 2 % shampoo Apply 1 application topically once a week.  . levothyroxine (SYNTHROID, LEVOTHROID) 50 MCG tablet Take 50 mcg by mouth daily before breakfast.   . Lidocaine (ASPERCREME LIDOCAINE) 4 % PTCH Apply 1 patch topically daily. Leave on for 12 hours  . losartan (COZAAR) 25 MG tablet Take 25 mg by mouth daily.   . NON FORMULARY Take 1 capsule by mouth as directed. Beneflex--may self-administer and keep at bedside  . omeprazole (PRILOSEC) 20 MG capsule Take 20 mg by mouth daily.  Marland Kitchen oxybutynin (DITROPAN-XL) 5 MG 24 hr tablet Take 5 mg by mouth daily.   Marland Kitchen oxyCODONE (OXY IR/ROXICODONE) 5 MG immediate release tablet Take one tablet by mouth twice daily as needed for breakthrough pain  . oxyCODONE (OXYCONTIN) 10 mg 12 hr tablet Take one tablet by mouth every morning for pain. Do not crush or chew.    . polyethylene glycol powder (GLYCOLAX/MIRALAX) powder Take 127.5 g by mouth daily. 17g once a day in liquid for constipation.  . polyvinyl alcohol (ARTIFICIAL TEARS) 1.4 % ophthalmic solution Place 1 drop into both eyes 2 (two) times daily.   Marland Kitchen senna-docusate (SENEXON-S) 8.6-50 MG per tablet Take 1 tablet by mouth at bedtime as needed. And patient may take 1 tablet my mouth once daily as needed for constipation  . triamcinolone (KENALOG) 0.1 % paste Use as directed 1 application in the mouth or throat as directed. 3-6 times daily  . [DISCONTINUED] acetaminophen (TYLENOL) 325 MG tablet Take 650 mg by mouth every 6 (six) hours as needed for mild pain, moderate pain or fever.  . [DISCONTINUED] carbamide peroxide (DEBROX) 6.5 % otic solution Instill 3-5 Drops into each ear once a week on Saturday and on Sunday at bedtime flush   No facility-administered encounter medications on file as of 10/29/2016.     Allergies (verified) Aleve [naproxen] and Indocin [indomethacin]   History: Past Medical History:  Diagnosis Date  . AAA (abdominal aortic aneurysm) (HCC)   . Abnormality of gait 08/01/2010  . Adhesive capsulitis of shoulder 08/01/2010   "Frozen" right shoulder  . Anemia   . Anxiety state, unspecified 08/01/2010  . Arthritis   . Congestive heart failure, unspecified 08/01/2010  . Cough 08/07/2015  . Disorders of bursae and tendons in shoulder region, unspecified  08/01/2010  . Edema 08/01/2010  . Fall   . Fecal impaction (HCC)   . GERD (gastroesophageal reflux disease)   . Hiatal hernia   . Hypercalcemia   . Hyperlipidemia   . Hypertension   . Hypertrophy of prostate without urinary obstruction and other lower urinary tract symptoms (LUTS) 08/01/2010  . Hyponatremia   . Hypothyroidism   . Infectious colitis   . Insomnia, unspecified 08/01/2010  . Joint pain   . Leg pain    with walking  . Lumbago 12/24/2010  . Orthostatic hypotension 11/05/2010  . Other specified cardiac  dysrhythmias(427.89) 12/24/2010  . Pain in joint, pelvic region and thigh 08/06/2010  . Peptic ulcer, unspecified site, unspecified as acute or chronic, without mention of hemorrhage, perforation, or obstruction 08/01/2010  . Renal failure, acute (HCC)   . Swelling, mass, or lump in head and neck 01/03/2011  . Ulcer    Stomach  . Unspecified disorder of kidney and ureter 08/01/2010  . Unspecified hearing loss 08/01/2010  . Unspecified vitamin D deficiency 08/06/2010  . Urinary frequency 09/13/2010  . Vertigo    Past Surgical History:  Procedure Laterality Date  . ABDOMINAL AORTIC ANEURYSM REPAIR  12-18-10   Stent graft repair of AAA  . APPENDECTOMY    . CATARACT EXTRACTION    . HEMORRHOID SURGERY  2000  . INGUINAL HERNIA REPAIR Left 2005  . TRANSTHORACIC ECHOCARDIOGRAM  07/19/2010    Left ventricle: There is hypokinesis of the inferior wall and  posterior wall. The EF is 35-40%. The cavity size was normal   Family History  Problem Relation Age of Onset  . Heart failure Brother    Social History   Occupational History  . previous owner of [employer] [place]    Social History Main Topics  . Smoking status: Never Smoker  . Smokeless tobacco: Never Used  . Alcohol use 0.0 oz/week     Comment: 1 1/2 oz daily   . Drug use: No  . Sexual activity: No   Tobacco Counseling Counseling given: Not Answered   Activities of Daily Living In your present state of health, do you have any difficulty performing the following activities: 10/29/2016 01/07/2016  Hearing? Malvin Johns  Vision? Y N  Difficulty concentrating or making decisions? Malvin Johns  Walking or climbing stairs? Y Y  Dressing or bathing? Y Y  Doing errands, shopping? Malvin Johns  Preparing Food and eating ? Y -  Using the Toilet? Y -  In the past six months, have you accidently leaked urine? Y -  Do you have problems with loss of bowel control? N -  Managing your Medications? Y -  Managing your Finances? Y -  Housekeeping or managing your  Housekeeping? Y -  Some recent data might be hidden    Immunizations and Health Maintenance Immunization History  Administered Date(s) Administered  . Influenza-Unspecified 12/27/2010, 12/29/2011, 12/27/2012, 12/13/2013, 12/14/2014, 12/20/2015  . PPD Test 07/30/2010, 08/14/2010  . Pneumococcal Polysaccharide-23 03/10/2005  . Td 03/11/1999   Health Maintenance Due  Topic Date Due  . PNA vac Low Risk Adult (2 of 2 - PCV13) 03/10/2006  . TETANUS/TDAP  03/10/2009  . INFLUENZA VACCINE  10/08/2016    Patient Care Team: Oneal Grout, MD as PCP - General (Internal Medicine) Ngetich, Donalee Citrin, NP as Nurse Practitioner (Family Medicine)  Indicate any recent Medical Services you may have received from other than [facility] providers in the past year (date may be approximate).    Assessment:  This is a routine wellness examination for Ricardo [name].   Hearing/Vision screen No exam data present  Dietary issues and exercise activities discussed: Current Exercise Habits: The patient does not participate in regular exercise at present, Exercise limited by: orthopedic condition(s)  Goals    None     Depression Screen PHQ 2/9 Scores 10/29/2016  PHQ - 2 Score 0    Fall Risk Fall Risk  10/29/2016 08/07/2015 08/07/2015 04/10/2015 03/27/2015  Falls in the past year? Yes Yes No No No  Comment - 06/02/15- skin tear right arm - - -  Number falls in past yr: 1 - - - -  Injury with Fall? No - - - -    Cognitive Function: MMSE - Mini Mental State Exam 10/29/2016  Not completed: Unable to complete        Screening Tests Health Maintenance  Topic Date Due  . PNA vac Low Risk Adult (2 of 2 - PCV13) 03/10/2006  . TETANUS/TDAP  03/10/2009  . INFLUENZA VACCINE  10/08/2016        Plan:    I have personally reviewed and addressed the Medicare Annual Wellness questionnaire and have noted the following in the patient's chart:  A. Medical and social history B. Use of alcohol, tobacco or illicit drugs    C. Current medications and supplements D. Functional ability and status E.  Nutritional status F.  Physical activity G. Advance directives H. List of other physicians I.  Hospitalizations, surgeries, and ER visits in previous 12 months J.  Vitals K. Screenings to include hearing, vision, cognitive, depression L. Referrals and appointments - none  In addition, I am unable to review and discuss with incapacitated patient certain preventive protocols, quality metrics, and best practice recommendations. A written personalized care plan for preventive services as well as general preventive health recommendations were provided to patient.   See attached scanned questionnaire for additional information.   Signed,   Annetta Maw, RN Nurse Health Advisor   Quick Notes   Health Maintenance: PNA 13 and TDAP due     Abnormal Screen: Unable to complete mental exam at this time     Patient Concerns: None     Nurse Concerns: None

## 2016-10-29 NOTE — Patient Instructions (Signed)
Ricardo [name] , Thank you for taking time to come for your Medicare Wellness Visit. I appreciate your ongoing commitment to your health goals. Please review the following plan we discussed and let me know if I can assist you in the future.   Screening recommendations/referrals: Colonoscopy excluded, pt over age 81 Recommended yearly ophthalmology/optometry visit for glaucoma screening and checkup Recommended yearly dental visit for hygiene and checkup  Vaccinations: Influenza vaccine due 2018 fall season Pneumococcal vaccine 13 due Tdap vaccine due Shingles vaccine not in records    Advanced directives: Health care power of attorney in chart, living will needed for chart  Conditions/risks identified: None  Next appointment: Dr. Glade Lloyd makes rounds  Preventive Care 65 Years and Older, Male Preventive care refers to lifestyle choices and visits with your health care provider that can promote health and wellness. What does preventive care include?  A yearly physical exam. This is also called an annual well check.  Dental exams once or twice a year.  Routine eye exams. Ask your health care provider how often you should have your eyes checked.  Personal lifestyle choices, including:  Daily care of your teeth and gums.  Regular physical activity.  Eating a healthy diet.  Avoiding tobacco and drug use.  Limiting alcohol use.  Practicing safe sex.  Taking low doses of aspirin every day.  Taking vitamin and mineral supplements as recommended by your health care provider. What happens during an annual well check? The services and screenings done by your health care provider during your annual well check will depend on your age, overall health, lifestyle risk factors, and family history of disease. Counseling  Your health care provider may ask you questions about your:  Alcohol use.  Tobacco use.  Drug use.  Emotional well-being.  Home and relationship  well-being.  Sexual activity.  Eating habits.  History of falls.  Memory and ability to understand (cognition).  Work and work Astronomer. Screening  You may have the following tests or measurements:  Height, weight, and BMI.  Blood pressure.  Lipid and cholesterol levels. These may be checked every 5 years, or more frequently if you are over 27 years old.  Skin check.  Lung cancer screening. You may have this screening every year starting at age 31 if you have a 30-pack-year history of smoking and currently smoke or have quit within the past 15 years.  Fecal occult blood test (FOBT) of the stool. You may have this test every year starting at age 72.  Flexible sigmoidoscopy or colonoscopy. You may have a sigmoidoscopy every 5 years or a colonoscopy every 10 years starting at age 20.  Prostate cancer screening. Recommendations will vary depending on your family history and other risks.  Hepatitis C blood test.  Hepatitis B blood test.  Sexually transmitted disease (STD) testing.  Diabetes screening. This is done by checking your blood sugar (glucose) after you have not eaten for a while (fasting). You may have this done every 1-3 years.  Abdominal aortic aneurysm (AAA) screening. You may need this if you are a current or former smoker.  Osteoporosis. You may be screened starting at age 61 if you are at high risk. Talk with your health care provider about your test results, treatment options, and if necessary, the need for more tests. Vaccines  Your health care provider may recommend certain vaccines, such as:  Influenza vaccine. This is recommended every year.  Tetanus, diphtheria, and acellular pertussis (Tdap, Td) vaccine. You may need  a Td booster every 10 years.  Zoster vaccine. You may need this after age 34.  Pneumococcal 13-valent conjugate (PCV13) vaccine. One dose is recommended after age 27.  Pneumococcal polysaccharide (PPSV23) vaccine. One dose is  recommended after age 56. Talk to your health care provider about which screenings and vaccines you need and how often you need them. This information is not intended to replace advice given to you by your health care provider. Make sure you discuss any questions you have with your health care provider. Document Released: 03/23/2015 Document Revised: 11/14/2015 Document Reviewed: 12/26/2014 Elsevier Interactive Patient Education  2017 [place] Prevention in the Home Falls can cause injuries. They can happen to people of all ages. There are many things you can do to make your home safe and to help prevent falls. What can I do on the outside of my home?  Regularly fix the edges of walkways and driveways and fix any cracks.  Remove anything that might make you trip as you walk through a door, such as a raised step or threshold.  Trim any bushes or trees on the path to your home.  Use bright outdoor lighting.  Clear any walking paths of anything that might make someone trip, such as rocks or tools.  Regularly check to see if handrails are loose or broken. Make sure that both sides of any steps have handrails.  Any raised decks and porches should have guardrails on the edges.  Have any leaves, snow, or ice cleared regularly.  Use sand or salt on walking paths during winter.  Clean up any spills in your garage right away. This includes oil or grease spills. What can I do in the bathroom?  Use night lights.  Install grab bars by the toilet and in the tub and shower. Do not use towel bars as grab bars.  Use non-skid mats or decals in the tub or shower.  If you need to sit down in the shower, use a plastic, non-slip stool.  Keep the floor dry. Clean up any water that spills on the floor as soon as it happens.  Remove soap buildup in the tub or shower regularly.  Attach bath mats securely with double-sided non-slip rug tape.  Do not have throw rugs and other things on  the floor that can make you trip. What can I do in the bedroom?  Use night lights.  Make sure that you have a light by your bed that is easy to reach.  Do not use any sheets or blankets that are too big for your bed. They should not hang down onto the floor.  Have a firm chair that has side arms. You can use this for support while you get dressed.  Do not have throw rugs and other things on the floor that can make you trip. What can I do in the kitchen?  Clean up any spills right away.  Avoid walking on wet floors.  Keep items that you use a lot in easy-to-reach places.  If you need to reach something above you, use a strong step stool that has a grab bar.  Keep electrical cords out of the way.  Do not use floor polish or wax that makes floors slippery. If you must use wax, use non-skid floor wax.  Do not have throw rugs and other things on the floor that can make you trip. What can I do with my stairs?  Do not leave any items on the  stairs.  Make sure that there are handrails on both sides of the stairs and use them. Fix handrails that are broken or loose. Make sure that handrails are as long as the stairways.  Check any carpeting to make sure that it is firmly attached to the stairs. Fix any carpet that is loose or worn.  Avoid having throw rugs at the top or bottom of the stairs. If you do have throw rugs, attach them to the floor with carpet tape.  Make sure that you have a light switch at the top of the stairs and the bottom of the stairs. If you do not have them, ask someone to add them for you. What else can I do to help prevent falls?  Wear shoes that:  Do not have high heels.  Have rubber bottoms.  Are comfortable and fit you well.  Are closed at the toe. Do not wear sandals.  If you use a stepladder:  Make sure that it is fully opened. Do not climb a closed stepladder.  Make sure that both sides of the stepladder are locked into place.  Ask someone to  hold it for you, if possible.  Clearly mark and make sure that you can see:  Any grab bars or handrails.  First and last steps.  Where the edge of each step is.  Use tools that help you move around (mobility aids) if they are needed. These include:  Canes.  Walkers.  Scooters.  Crutches.  Turn on the lights when you go into a dark area. Replace any light bulbs as soon as they burn out.  Set up your furniture so you have a clear path. Avoid moving your furniture around.  If any of your floors are uneven, fix them.  If there are any pets around you, be aware of where they are.  Review your medicines with your doctor. Some medicines can make you feel dizzy. This can increase your chance of falling. Ask your doctor what other things that you can do to help prevent falls. This information is not intended to replace advice given to you by your health care provider. Make sure you discuss any questions you have with your health care provider. Document Released: 12/21/2008 Document Revised: 08/02/2015 Document Reviewed: 03/31/2014 Elsevier Interactive Patient Education  2017 [employer].

## 2016-11-03 ENCOUNTER — Other Ambulatory Visit: Payer: Self-pay | Admitting: *Deleted

## 2016-11-03 MED ORDER — OXYCODONE HCL 5 MG PO TABS
ORAL_TABLET | ORAL | 0 refills | Status: DC
Start: 2016-11-03 — End: 2017-05-06

## 2016-11-12 ENCOUNTER — Other Ambulatory Visit: Payer: Self-pay | Admitting: *Deleted

## 2016-12-01 ENCOUNTER — Other Ambulatory Visit: Payer: Self-pay

## 2016-12-01 MED ORDER — OXYCODONE HCL ER 10 MG PO T12A: EXTENDED_RELEASE_TABLET | ORAL | 0 refills | Status: DC

## 2016-12-08 ENCOUNTER — Encounter: Payer: Self-pay | Admitting: Internal Medicine

## 2016-12-08 ENCOUNTER — Non-Acute Institutional Stay: Payer: Medicare PPO | Admitting: Internal Medicine

## 2016-12-08 DIAGNOSIS — K219 Gastro-esophageal reflux disease without esophagitis: Secondary | ICD-10-CM | POA: Diagnosis not present

## 2016-12-08 DIAGNOSIS — K5909 Other constipation: Secondary | ICD-10-CM

## 2016-12-08 DIAGNOSIS — M792 Neuralgia and neuritis, unspecified: Secondary | ICD-10-CM | POA: Diagnosis not present

## 2016-12-08 DIAGNOSIS — R3911 Hesitancy of micturition: Secondary | ICD-10-CM

## 2016-12-08 DIAGNOSIS — E039 Hypothyroidism, unspecified: Secondary | ICD-10-CM

## 2016-12-08 DIAGNOSIS — N183 Chronic kidney disease, stage 3 unspecified: Secondary | ICD-10-CM

## 2016-12-08 DIAGNOSIS — D638 Anemia in other chronic diseases classified elsewhere: Secondary | ICD-10-CM | POA: Diagnosis not present

## 2016-12-08 DIAGNOSIS — I10 Essential (primary) hypertension: Secondary | ICD-10-CM

## 2016-12-08 DIAGNOSIS — M150 Primary generalized (osteo)arthritis: Secondary | ICD-10-CM

## 2016-12-08 DIAGNOSIS — M159 Polyosteoarthritis, unspecified: Secondary | ICD-10-CM

## 2016-12-08 DIAGNOSIS — N401 Enlarged prostate with lower urinary tract symptoms: Secondary | ICD-10-CM

## 2016-12-08 NOTE — Progress Notes (Signed)
Location:  [facility] Room Number: 32 Place of Service:  ALF [phone]) Provider:  Oneal Grout MD  Oneal Grout, MD  Patient Care Team: Oneal Grout, MD as PCP - General (Internal Medicine) Ngetich, Donalee Citrin, NP as Nurse Practitioner (Family Medicine)  Extended Emergency Contact Information Primary Emergency Contact: [name], [place] Home Phone: [phone] Relation: Son Secondary Emergency Contact: [name] [place] Home Phone: [phone] Relation: Grandson  Code Status:  Full Code Goals of care: Advanced Directive information Advanced Directives 12/08/2016  Does Patient Have a Medical Advance Directive? Yes  Type of Estate agent of [place];Living will  Does patient want to make changes to medical advance directive? No - Patient declined  Copy of Healthcare Power of Attorney in Chart? Yes  Pre-existing out of facility DNR order (yellow form or pink MOST form) -     Chief Complaint  Patient presents with  . Medical Management of Chronic Issues    Routine Visit     HPI:  Pt is a 83 y.o. male seen today for medical management of chronic diseases.  He is very hard of hearing and has hearing aids. He denies any new concern this visit.   OA- has ongoing pain to joints and back. Currently on oxycontin 10 mg daily with oxyIR 5 mg bid prn. Also on lidocaine patch   Neuropathic pain- takes gabapentin, helps him some  Hypothyroidism- takes his levothyroxine and tolerating well  BPH- has urinary hesitancy, taking finasteride and oxybutynin  HTN- bp stable on review. Taking losartan 25 mg daily  gerd- denies concern, tolerating omeprazole well  Chronic constipation- miralax and senokot s helps   Past Medical History:  Diagnosis Date  . AAA (abdominal aortic aneurysm) (HCC)   . Abnormality of gait 08/01/2010  . Adhesive capsulitis of shoulder 08/01/2010   "Frozen" right shoulder  . Anemia   . Anxiety state, unspecified 08/01/2010  . Arthritis   . Congestive heart failure, unspecified 08/01/2010  . Cough 08/07/2015  . Disorders of bursae and tendons in shoulder region, unspecified 08/01/2010  . Edema 08/01/2010  . Fall   . Fecal impaction (HCC)   . GERD (gastroesophageal reflux disease)   . Hiatal hernia   . Hypercalcemia   . Hyperlipidemia   . Hypertension   . Hypertrophy of prostate without urinary obstruction and other lower urinary tract symptoms (LUTS) 08/01/2010  . Hyponatremia   . Hypothyroidism   . Infectious colitis   . Insomnia, unspecified 08/01/2010  . Joint pain   . Leg pain    with walking  . Lumbago 12/24/2010  . Orthostatic hypotension 11/05/2010  . Other specified cardiac dysrhythmias(427.89) 12/24/2010  . Pain in joint, pelvic region and thigh 08/06/2010  . Peptic ulcer, unspecified site, unspecified as acute or chronic, without mention of hemorrhage, perforation, or obstruction 08/01/2010  . Renal failure, acute (HCC)   . Swelling, mass, or lump in head and neck 01/03/2011  . Ulcer    Stomach  . Unspecified disorder of kidney and ureter 08/01/2010  . Unspecified hearing loss 08/01/2010  . Unspecified vitamin D deficiency 08/06/2010  . Urinary frequency 09/13/2010  . Vertigo    Past Surgical History:  Procedure Laterality Date  . ABDOMINAL AORTIC ANEURYSM REPAIR  12-18-10   Stent graft repair of AAA  . APPENDECTOMY    . CATARACT EXTRACTION    . HEMORRHOID SURGERY  2000  .  INGUINAL HERNIA REPAIR Left 2005  . TRANSTHORACIC ECHOCARDIOGRAM  07/19/2010    Left ventricle: There is hypokinesis of the inferior wall and  posterior wall. The EF is 35-40%. The cavity size was normal    Allergies  Allergen Reactions  . Aleve [Naproxen]   . Indocin [Indomethacin]     Outpatient Encounter Prescriptions as of 12/08/2016  Medication Sig  . alum & mag hydroxide-simeth (MINTOX) 200-200-20 MG/5ML suspension Take 30 mLs by mouth  every 4 (four) hours as needed for indigestion or heartburn.  . benzocaine (ORAJEL) 10 % mucosal gel Use as directed 1 application in the mouth or throat as needed for mouth pain.  . cholecalciferol (VITAMIN D) 1000 UNITS tablet Take 2,000 Units by mouth daily.   . finasteride (PROSCAR) 5 MG tablet Take 5 mg by mouth daily.   . fluocinonide (LIDEX) 0.05 % external solution Apply 1 application topically as needed.  . gabapentin (NEURONTIN) 100 MG capsule Take 100 mg by mouth at bedtime.  Marland Kitchen guaiFENesin (ROBITUSSIN) 100 MG/5ML liquid Take 10 CC by mouth every 4 hours as needed for cough or excessive mucus  . ketoconazole (NIZORAL) 2 % shampoo Apply 1 application topically once a week.  . levothyroxine (SYNTHROID, LEVOTHROID) 50 MCG tablet Take 50 mcg by mouth daily before breakfast.   . Lidocaine (ASPERCREME LIDOCAINE) 4 % PTCH Apply 1 patch topically daily. Leave on for 12 hours  . losartan (COZAAR) 25 MG tablet Take 25 mg by mouth daily.   . NON FORMULARY Take 1 capsule by mouth as directed. Beneflex--may self-administer and keep at bedside  . omeprazole (PRILOSEC) 20 MG capsule Take 20 mg by mouth daily.  Marland Kitchen oxybutynin (DITROPAN-XL) 5 MG 24 hr tablet Take 5 mg by mouth daily.   Marland Kitchen oxyCODONE (OXY IR/ROXICODONE) 5 MG immediate release tablet Take one tablet by mouth twice daily as needed for breakthrough pain  . oxyCODONE (OXYCONTIN) 10 mg 12 hr tablet Take one tablet by mouth every morning for pain. Do not crush or chew.  . polyethylene glycol (MIRALAX / GLYCOLAX) packet Take 17 g by mouth daily.  . polyvinyl alcohol (ARTIFICIAL TEARS) 1.4 % ophthalmic solution Place 1 drop into both eyes as needed.   . sennosides-docusate sodium (SENOKOT-S) 8.6-50 MG tablet Take 1 tablet by mouth daily as needed for constipation.  . sennosides-docusate sodium (SENOKOT-S) 8.6-50 MG tablet Take 1 tablet by mouth every other day.  . triamcinolone (KENALOG) 0.1 % paste Use as directed 1 application in the mouth or  throat as directed. 3-6 times daily  . [DISCONTINUED] polyethylene glycol powder (GLYCOLAX/MIRALAX) powder Take 127.5 g by mouth daily. 17g once a day in liquid for constipation.  . [DISCONTINUED] senna-docusate (SENEXON-S) 8.6-50 MG per tablet Take 1 tablet by mouth at bedtime as needed. And patient may take 1 tablet my mouth once daily as needed for constipation   No facility-administered encounter medications on file as of 12/08/2016.     Review of Systems  Constitutional: Negative for appetite change, chills and fever.  HENT: Positive for hearing loss and voice change. Negative for congestion, mouth sores, sinus pressure and sore throat.   Respiratory: Positive for cough. Negative for shortness of breath.   Cardiovascular: Negative for chest pain, palpitations and leg swelling.  Gastrointestinal: Positive for constipation. Negative for abdominal pain, diarrhea, nausea and vomiting.  Genitourinary: Negative for dysuria and hematuria.  Musculoskeletal: Positive for arthralgias, back pain and gait problem.       Uses motorized wheelchair  Skin:  Negative for rash and wound.  Neurological: Negative for dizziness, seizures, syncope and headaches.  Psychiatric/Behavioral: Negative for behavioral problems and confusion.    Immunization History  Administered Date(s) Administered  . Influenza-Unspecified 12/27/2010, 12/29/2011, 12/27/2012, 12/13/2013, 12/14/2014, 12/20/2015  . PPD Test 07/30/2010, 08/14/2010  . Pneumococcal Polysaccharide-23 03/10/2005  . Td 03/11/1999   Pertinent  Health Maintenance Due  Topic Date Due  . INFLUENZA VACCINE  12/18/2016 (Originally 10/08/2016)  . PNA vac Low Risk Adult (2 of 2 - PCV13) 01/08/2017 (Originally 03/10/2006)   Fall Risk  10/29/2016 08/07/2015 08/07/2015 04/10/2015 03/27/2015  Falls in the past year? Yes Yes No No No  Comment - 06/02/15- skin tear right arm - - -  Number falls in past yr: 1 - - - -  Injury with Fall? No - - - -   Functional Status  Survey:    Vitals:   12/08/16 1619  BP: 118/76  Pulse: 67  Resp: 20  Temp: 98.4 F (36.9 C)  TempSrc: Oral  SpO2: 96%  Weight: 138 lb (62.6 kg)  Height:  (1.753 m)   Body mass index is 20.38 kg/m. Physical Exam  Constitutional: He is oriented to person, place, and time. He appears well-developed and well-nourished. No distress.  HENT:  Head: Normocephalic and atraumatic.  Mouth/Throat: Oropharynx is clear and moist. No oropharyngeal exudate.  Eyes: Pupils are equal, round, and reactive to light. Conjunctivae and EOM are normal.  Neck: Normal range of motion. Neck supple.  Cardiovascular: Normal rate and regular rhythm.   Murmur heard. Pulmonary/Chest: Effort normal and breath sounds normal. No respiratory distress. He has no wheezes. He has no rales.  Abdominal: Soft. Bowel sounds are normal. He exhibits no distension. There is no tenderness. There is no rebound and no guarding.  Musculoskeletal: He exhibits edema.  Trace edema mainly around ankle  Lymphadenopathy:    He has no cervical adenopathy.  Neurological: He is alert and oriented to person, place, and time.  Skin: Skin is warm and dry. He is not diaphoretic.  Psychiatric: He has a normal mood and affect. His behavior is normal.    Labs reviewed:  Recent Labs  01/07/16 0439 01/08/16 0446 01/08/16 1610 01/09/16 0459 01/17/16 05/01/16  NA 136 134*  --  134* 136* 138  K 4.1 4.5  --  4.3 4.8 4.9  CL 106 105  --  107  --   --   CO2 25 24  --  21*  --   --   GLUCOSE 101* 82  --  87  --   --   BUN 32* 24*  --  26* 23* 32*  CREATININE 1.20 1.21  --  1.14 1.2 1.3  CALCIUM 8.1* 8.1*  --  7.8*  --   --   MG  --   --  1.8  --   --   --    No results for input(s): AST, ALT, ALKPHOS, BILITOT, PROT, ALBUMIN in the last 8760 hours.  Recent Labs  01/06/16 1935 01/07/16 0439 01/17/16 05/01/16  WBC 8.2 6.5 8.2 7.1  HGB 10.9* 9.4* 10.7* 10.8*  HCT 33.2* 28.8* 32* 34*  MCV 87.4 87.8  --   --   PLT 168 133* 235  178   Lab Results  Component Value Date   TSH 2.42 05/01/2016   No results found for: HGBA1C Lab Results  Component Value Date   CHOL 169 06/25/2015   HDL 51 06/25/2015   LDLCALC 102 06/25/2015  TRIG 82 06/25/2015    Significant Diagnostic Results in last 30 days:  No results found.  Assessment/Plan  OA Tolerable pain. Continue oxycontin 10 mg daily with oxyIR 5 mg bid prn. Also on lidocaine patch. Back precautions. Continue with back brace.  Neuropathic pain Continue his gabapentin  Hypothyroidism Lab Results  Component Value Date   TSH 2.42 05/01/2016   Check tsh. Continue levothyroxine   BPH with urinary hesitancy Continue finasteride and oxybutynin  HTN Stable, continue losartan 25 mg daily, check bmp  gerd Continue omeprazole and monitor  Chronic constipation With him on opioids. Continue stool softner and laxative, maintain hydration  Anemia of chronic disease Check cbc  ckd stage 3 Monitor bmp  Family/ staff Communication: reviewed care plan with patient and charge nurse.    Labs/tests ordered:  Cbc, cmp, TSH   Oneal Grout, MD Internal Medicine [facility] [address] [place], [place] [address] Cell Phone (Monday-Friday 8 am - 5 pm): [phone] On Call: [phone] and follow prompts after 5 pm and on weekends Office Phone: [phone] Office Fax: [phone]

## 2016-12-11 DIAGNOSIS — I509 Heart failure, unspecified: Secondary | ICD-10-CM | POA: Diagnosis not present

## 2016-12-11 DIAGNOSIS — I10 Essential (primary) hypertension: Secondary | ICD-10-CM | POA: Diagnosis not present

## 2016-12-11 DIAGNOSIS — E039 Hypothyroidism, unspecified: Secondary | ICD-10-CM | POA: Diagnosis not present

## 2016-12-11 DIAGNOSIS — K210 Gastro-esophageal reflux disease with esophagitis: Secondary | ICD-10-CM | POA: Diagnosis not present

## 2016-12-11 LAB — CBC AND DIFFERENTIAL
HCT: 31 — AB (ref 41–53)
HEMATOCRIT: 31 — AB (ref 41–53)
HEMOGLOBIN: 10.2 — AB (ref 13.5–17.5)
HEMOGLOBIN: 10.2 — AB (ref 13.5–17.5)
Platelets: 158 (ref 150–399)
Platelets: 158 (ref 150–399)
WBC: 5.3
WBC: 5.3

## 2016-12-11 LAB — BASIC METABOLIC PANEL
BUN: 35 — AB (ref 4–21)
BUN: 35 — AB (ref 4–21)
Creatinine: 1.1 (ref 0.6–1.3)
Creatinine: 1.1 (ref <=1.3)
GLUCOSE: 78
GLUCOSE: 78
POTASSIUM: 5 (ref 3.4–5.3)
Potassium: 5 (ref 3.4–5.3)
SODIUM: 137 (ref 137–147)
Sodium: 137 (ref 137–147)

## 2016-12-11 LAB — HEPATIC FUNCTION PANEL
ALT: 6 — AB (ref 10–40)
ALT: 6 — AB (ref 10–40)
AST: 15 (ref 14–40)
AST: 15 (ref 14–40)
Alkaline Phosphatase: 91 (ref 25–125)
Alkaline Phosphatase: 91 (ref 25–125)
Bilirubin, Total: 0.5
Bilirubin, Total: 0.5

## 2016-12-11 LAB — TSH: TSH: 2.7 (ref 0.41–5.90)

## 2016-12-15 ENCOUNTER — Other Ambulatory Visit: Payer: Self-pay | Admitting: *Deleted

## 2017-01-06 ENCOUNTER — Encounter: Payer: Self-pay | Admitting: Family

## 2017-01-06 ENCOUNTER — Non-Acute Institutional Stay: Payer: Medicare PPO | Admitting: Family

## 2017-01-06 DIAGNOSIS — W19XXXA Unspecified fall, initial encounter: Secondary | ICD-10-CM

## 2017-01-06 DIAGNOSIS — R2681 Unsteadiness on feet: Secondary | ICD-10-CM

## 2017-01-06 DIAGNOSIS — S20419A Abrasion of unspecified back wall of thorax, initial encounter: Secondary | ICD-10-CM | POA: Diagnosis not present

## 2017-01-06 DIAGNOSIS — M6281 Muscle weakness (generalized): Secondary | ICD-10-CM

## 2017-01-06 DIAGNOSIS — Y92129 Unspecified place in nursing home as the place of occurrence of the external cause: Secondary | ICD-10-CM

## 2017-01-06 NOTE — Progress Notes (Signed)
Location:  [facility] Room Number: 32 Place of Service:  ALF [id number]) Provider: Jeriah Skufca FNP-C  Oneal Grout, MD  Patient Care Team: Oneal Grout, MD as PCP - General (Internal Medicine) Parsa Rickett, Donalee Citrin, NP as Nurse Practitioner (Family Medicine)  Extended Emergency Contact Information Primary Emergency Contact: [name], [place] Home Phone: [phone] Relation: Son Secondary Emergency Contact: [name] [place] Home Phone: [phone] Relation: Grandson  Code Status:  DNR Goals of care: Advanced Directive information Advanced Directives 01/06/2017  Does Patient Have a Medical Advance Directive? -  Type of Estate agent of [place];Living will;Out of facility DNR (pink MOST or yellow form)  Does patient want to make changes to medical advance directive? -  Copy of Healthcare Power of Attorney in Chart? Yes  Pre-existing out of facility DNR order (yellow form or pink MOST form) Pink MOST form placed in chart (order not valid for inpatient use)     Chief Complaint  Patient presents with  . Acute Visit    fall x2 in 15 minutes    HPI:  Pt is a 83 y.o. male seen today at [facility] for an acute visit for evaluation of fall episode.He is seen in his room today with Nurse Tech at bedside.Facility Nurse reports patient fell twice within 15 minutes. Patient was heard at 7: 51 Am calling for assist and was found lying on the bathroom floor on his back.He was barefoot with shirt hooked on partly wheelchair.Nurse reports small bruise behind left knee. He denies pain or discomfort.Later at 7:24 am CNA was in resident's room assisting him with AM care.Nurse reports patient  stood up from the bed by himself and ost balance and fell back into sitting position on the floor in front of file cabinet. He sustained an abrasion approx. 15 cm to his  upper right back.  Abrasion area was cleansed with NS and TAO applied.He denies any acute pain during visit though states feels ashamed of himself for falling. Patient reassured that accidents happen and was encouraged to call for assistance. He denies any headache, dizziness, fever, chills, cough or urinary tract infections symptoms. His blood pressure log reviewed B/P readings ranging in the 130's/80's-150's/77 and HR 50's-70's.No weight loss noted.    Past Medical History:  Diagnosis Date  . AAA (abdominal aortic aneurysm) (HCC)   . Abnormality of gait 08/01/2010  . Adhesive capsulitis of shoulder 08/01/2010   "Frozen" right shoulder  . Anemia   . Anxiety state, unspecified 08/01/2010  . Arthritis   . Congestive heart failure, unspecified 08/01/2010  . Cough 08/07/2015  . Disorders of bursae and tendons in shoulder region, unspecified 08/01/2010  . Edema 08/01/2010  . Fall   . Fecal impaction (HCC)   . GERD (gastroesophageal reflux disease)   . Hiatal hernia   . Hypercalcemia   . Hyperlipidemia   . Hypertension   . Hypertrophy of prostate without urinary obstruction and other lower urinary tract symptoms (LUTS) 08/01/2010  . Hyponatremia   . Hypothyroidism   . Infectious colitis   . Insomnia, unspecified 08/01/2010  . Joint pain   . Leg pain    with walking  . Lumbago 12/24/2010  . Orthostatic hypotension 11/05/2010  . Other specified cardiac dysrhythmias(427.89) 12/24/2010  . Pain in joint, pelvic region and thigh 08/06/2010  . Peptic ulcer, unspecified site, unspecified as acute or chronic, without mention  of hemorrhage, perforation, or obstruction 08/01/2010  . Renal failure, acute (HCC)   . Swelling, mass, or lump in head and neck 01/03/2011  . Ulcer    Stomach  . Unspecified disorder of kidney and ureter 08/01/2010  . Unspecified hearing loss 08/01/2010  . Unspecified vitamin D deficiency 08/06/2010  . Urinary frequency 09/13/2010  . Vertigo    Past Surgical History:  Procedure Laterality Date  .  ABDOMINAL AORTIC ANEURYSM REPAIR  12-18-10   Stent graft repair of AAA  . APPENDECTOMY    . CATARACT EXTRACTION    . HEMORRHOID SURGERY  2000  . INGUINAL HERNIA REPAIR Left 2005  . TRANSTHORACIC ECHOCARDIOGRAM  07/19/2010    Left ventricle: There is hypokinesis of the inferior wall and  posterior wall. The EF is 35-40%. The cavity size was normal    Allergies  Allergen Reactions  . Aleve [Naproxen]   . Indocin [Indomethacin]     Outpatient Encounter Prescriptions as of 01/06/2017  Medication Sig  . alum & mag hydroxide-simeth (MINTOX) 200-200-20 MG/5ML suspension Take 30 mLs by mouth every 4 (four) hours as needed for indigestion or heartburn.  . benzocaine (ORAJEL) 10 % mucosal gel Use as directed 1 application in the mouth or throat as needed for mouth pain.  . cholecalciferol (VITAMIN D) 1000 UNITS tablet Take 2,000 Units by mouth daily.   . finasteride (PROSCAR) 5 MG tablet Take 5 mg by mouth daily.   . fluocinonide (LIDEX) 0.05 % external solution Apply 1 application topically as needed.  . gabapentin (NEURONTIN) 100 MG capsule Take 100 mg by mouth at bedtime.  Marland Kitchen guaiFENesin (ROBITUSSIN) 100 MG/5ML liquid Take 10 CC by mouth every 4 hours as needed for cough or excessive mucus  . ketoconazole (NIZORAL) 2 % shampoo Apply 1 application topically once a week.  . levothyroxine (SYNTHROID, LEVOTHROID) 50 MCG tablet Take 50 mcg by mouth daily before breakfast.   . Lidocaine (ASPERCREME LIDOCAINE) 4 % PTCH Apply 1 patch topically daily. Leave on for 12 hours  . losartan (COZAAR) 25 MG tablet Take 25 mg by mouth daily.   . NON FORMULARY Take 1 capsule by mouth as directed. Beneflex--may self-administer and keep at bedside  . omeprazole (PRILOSEC) 20 MG capsule Take 20 mg by mouth daily.  Marland Kitchen oxybutynin (DITROPAN-XL) 5 MG 24 hr tablet Take 5 mg by mouth daily.   Marland Kitchen oxyCODONE (OXY IR/ROXICODONE) 5 MG immediate release tablet Take one tablet by mouth twice daily as needed for breakthrough pain    . oxyCODONE (OXYCONTIN) 10 mg 12 hr tablet Take one tablet by mouth every morning for pain. Do not crush or chew.  . polyethylene glycol (MIRALAX / GLYCOLAX) packet Take 17 g by mouth daily.  . polyvinyl alcohol (ARTIFICIAL TEARS) 1.4 % ophthalmic solution Place 1 drop into both eyes as needed.   . sennosides-docusate sodium (SENOKOT-S) 8.6-50 MG tablet Take 1 tablet by mouth daily as needed for constipation.  . sennosides-docusate sodium (SENOKOT-S) 8.6-50 MG tablet Take 1 tablet by mouth every other day.  . triamcinolone (KENALOG) 0.1 % paste Use as directed 1 application in the mouth or throat as directed. 3-6 times daily   No facility-administered encounter medications on file as of 01/06/2017.     Review of Systems  Constitutional: Negative for activity change, appetite change, chills, fatigue and fever.  HENT: Positive for hearing loss. Negative for congestion, rhinorrhea, sinus pain, sinus pressure, sneezing and sore throat.   Eyes: Negative for discharge, redness and visual  disturbance.  Respiratory: Negative for cough, chest tightness, shortness of breath and wheezing.   Cardiovascular: Negative for chest pain, palpitations and leg swelling.  Gastrointestinal: Negative for abdominal distention, abdominal pain, constipation, diarrhea, nausea and vomiting.  Endocrine: Negative for cold intolerance and heat intolerance.  Genitourinary: Negative for dysuria, flank pain, frequency and urgency.  Musculoskeletal: Positive for back pain and gait problem.  Skin: Negative for color change, pallor and rash.  Neurological: Negative for dizziness, syncope, light-headedness, numbness and headaches.  Psychiatric/Behavioral: Negative for agitation, confusion and sleep disturbance. The patient is not nervous/anxious.     Immunization History  Administered Date(s) Administered  . Influenza-Unspecified 12/27/2010, 12/29/2011, 12/27/2012, 12/13/2013, 12/14/2014, 12/20/2015, 12/17/2016  . PPD Test  07/30/2010, 08/14/2010  . Pneumococcal Conjugate-13 11/04/2016  . Pneumococcal Polysaccharide-23 03/10/2005  . Td 03/11/1999  . Tdap 10/31/2016   Pertinent  Health Maintenance Due  Topic Date Due  . PNA vac Low Risk Adult (2 of 2 - PCV13) 01/08/2017 (Originally 03/10/2006)  . INFLUENZA VACCINE  Completed   Fall Risk  10/29/2016 08/07/2015 08/07/2015 04/10/2015 03/27/2015  Falls in the past year? Yes Yes No No No  Comment - 06/02/15- skin tear right arm - - -  Number falls in past yr: 1 - - - -  Injury with Fall? No - - - -    Vitals:   01/06/17 0955  BP: (!) 162/64  Pulse: (!) 54  Resp: 18  Temp: (!) 96.6 F (35.9 C)  SpO2: 94%  Weight: 138 lb (62.6 kg)  Height: 5\' 9"  (1.753 m)   Body mass index is 20.38 kg/m. Physical Exam  Constitutional: He is oriented to person, place, and time. No distress.  Frail elderly   HENT:  Head: Normocephalic.  Mouth/Throat: Oropharynx is clear and moist. No oropharyngeal exudate.  HOH hearing Aids in place   Eyes: Pupils are equal, round, and reactive to light. Conjunctivae and EOM are normal. Right eye exhibits no discharge. Left eye exhibits no discharge. No scleral icterus.  Neck: Normal range of motion. No JVD present. No thyromegaly present.  Cardiovascular: Intact distal pulses.  Exam reveals no gallop and no friction rub.   Murmur heard. Pulmonary/Chest: Effort normal and breath sounds normal. No respiratory distress. He has no wheezes. He has no rales.  Abdominal: Soft. He exhibits no distension. There is no tenderness. There is no rebound and no guarding.  Musculoskeletal: He exhibits no tenderness.  Bilateral lower extremities weakness with trace-1+ edema.    Lymphadenopathy:    He has no cervical adenopathy.  Neurological: He is oriented to person, place, and time. Coordination normal.  Skin: Skin is warm and dry. No rash noted. No pallor.  Right upper back abrasion without any signs of infections.   Psychiatric: He has a normal  mood and affect.   Labs reviewed:  Recent Labs  01/08/16 0446 01/08/16 1610 01/09/16 0459 01/17/16 05/01/16 12/11/16  NA 134*  --  134* 136* 138 137  K 4.5  --  4.3 4.8 4.9 5.0  CL 105  --  107  --   --   --   CO2 24  --  21*  --   --   --   GLUCOSE 82  --  87  --   --   --   BUN 24*  --  26* 23* 32* 35*  CREATININE 1.21  --  1.14 1.2 1.3 1.1  CALCIUM 8.1*  --  7.8*  --   --   --  MG  --  1.8  --   --   --   --     Recent Labs  12/11/16  AST 15  ALT 6*  ALKPHOS 91    Recent Labs  01/17/16 05/01/16 12/11/16  WBC 8.2 7.1 5.3  HGB 10.7* 10.8* 10.2*  HCT 32* 34* 31*  PLT 235 178 158   Lab Results  Component Value Date   TSH 2.42 05/01/2016   No results found for: HGBA1C Lab Results  Component Value Date   CHOL 169 06/25/2015   HDL 51 06/25/2015   LDLCALC 102 06/25/2015   TRIG 82 06/25/2015    Significant Diagnostic Results in last 30 days:  No results found.  Assessment/Plan 1. Muscle weakness (generalized) Lower extremities weakness.He remains high risk for falls. Will have him work with PT/OT for exercise, ROM and muscle strengthening.   2. Unsteady gait Status post fall twice within 15 minutes.Blood pressure log reviewed B/P readings ranging in the 130's/80's-150's/77 and HR 50's-70's.check orthostatic blood pressure lying supine, seated and while standing x 1 week.Encouraged to call for assistance. Fall and safety precautions.PT/OT as above.    3. Abrasion of back Cleanse with saline, pat dry and apply TAO daily. Monitor for signs of infections.   4. Fall at nursing home, initial encounter Larey SeatFell twice within 15 minutes while getting OOB by himself and in the bathroom trying to stand while bare feet.Patient to wear non-skid socks at all times while out of bed. PT/OT as above. Continue with Fall and safety precautions.  Family/ staff Communication: Reviewed plan of care with patient and facility Nurse.   Labs/tests ordered: None   Keelyn Monjaras C Ayodele Hartsock, NP

## 2017-01-06 NOTE — Progress Notes (Signed)
Opened in error

## 2017-01-23 ENCOUNTER — Other Ambulatory Visit: Payer: Self-pay | Admitting: *Deleted

## 2017-01-23 MED ORDER — OXYCODONE HCL ER 10 MG PO T12A: EXTENDED_RELEASE_TABLET | ORAL | 0 refills | Status: DC

## 2017-01-23 NOTE — Telephone Encounter (Signed)
Written Rx given to nursing staff at [facility].  

## 2017-02-11 DIAGNOSIS — H6122 Impacted cerumen, left ear: Secondary | ICD-10-CM | POA: Diagnosis not present

## 2017-02-18 ENCOUNTER — Encounter: Payer: Self-pay | Admitting: Family

## 2017-02-18 ENCOUNTER — Non-Acute Institutional Stay: Payer: Medicare PPO | Admitting: Family

## 2017-02-18 DIAGNOSIS — G8929 Other chronic pain: Secondary | ICD-10-CM | POA: Diagnosis not present

## 2017-02-18 DIAGNOSIS — M545 Low back pain, unspecified: Secondary | ICD-10-CM

## 2017-02-18 DIAGNOSIS — I10 Essential (primary) hypertension: Secondary | ICD-10-CM | POA: Diagnosis not present

## 2017-02-18 NOTE — Progress Notes (Signed)
Location:  [facility] Room Number: 32 Place of Service:  ALF [id number]) Provider: Kyri Shader FNP-C  Oneal GroutPandey, Mahima, MD  Patient Care Team: Oneal GroutPandey, Mahima, MD as PCP - General (Internal Medicine) Ayslin Kundert, Donalee Citrininah C, NP as Nurse Practitioner (Family Medicine)  Extended Emergency Contact Information Primary Emergency Contact: [name],Phillip          [place] Home Phone: [phone] Relation: Son Secondary Emergency Contact: [name],[name]  [place] Home Phone: [phone] Relation: Grandson  Code Status:  DNR Goals of care: Advanced Directive information Advanced Directives 02/18/2017  Does Patient Have a Medical Advance Directive? Yes  Type of Advance Directive Out of facility DNR (pink MOST or yellow form);Healthcare Power of [place];Living will  Does patient want to make changes to medical advance directive? -  Copy of Healthcare Power of Attorney in Chart? Yes  Pre-existing out of facility DNR order (yellow form or pink MOST form) Pink MOST form placed in chart (order not valid for inpatient use)     Chief Complaint  Patient presents with  . Acute Visit    elevated blood pressures    HPI:  Pt is a [age] y.o. [facility] for evaluation of elevated blood pressure.He is seen in his room today with facility nurse present.Patient's Nurse reports patient's blood pressure seems to be elevated especially in the mornings.He denies any headache,dizziness,chest pain or shortness of breath.    Past Medical History:  Diagnosis Date  . AAA (abdominal aortic aneurysm) (HCC)   . Abnormality of gait 08/01/2010  . Adhesive capsulitis of shoulder 08/01/2010   "Frozen" right shoulder  . Anemia   . Anxiety state, unspecified 08/01/2010  . Arthritis   . Congestive heart failure, unspecified 08/01/2010  . Cough 08/07/2015  . Disorders of bursae and tendons in shoulder region,  unspecified 08/01/2010  . Edema 08/01/2010  . Fall   . Fecal impaction (HCC)   . GERD (gastroesophageal reflux disease)   . Hiatal hernia   . Hypercalcemia   . Hyperlipidemia   . Hypertension   . Hypertrophy of prostate without urinary obstruction and other lower urinary tract symptoms (LUTS) 08/01/2010  . Hyponatremia   . Hypothyroidism   . Infectious colitis   . Insomnia, unspecified 08/01/2010  . Joint pain   . Leg pain    with walking  . Lumbago 12/24/2010  . Orthostatic hypotension 11/05/2010  . Other specified cardiac dysrhythmias(427.89) 12/24/2010  . Pain in joint, pelvic region and thigh 08/06/2010  . Peptic ulcer, unspecified site, unspecified as acute or chronic, without mention of hemorrhage, perforation, or obstruction 08/01/2010  . Renal failure, acute (HCC)   . Swelling, mass, or lump in head and neck 01/03/2011  . Ulcer    Stomach  . Unspecified disorder of kidney and ureter 08/01/2010  . Unspecified hearing loss 08/01/2010  . Unspecified vitamin D deficiency 08/06/2010  . Urinary frequency 09/13/2010  . Vertigo    Past Surgical History:  Procedure Laterality Date  . ABDOMINAL AORTIC ANEURYSM REPAIR  12-18-10   Stent graft repair of AAA  . APPENDECTOMY    . CATARACT EXTRACTION    . HEMORRHOID SURGERY  2000  . INGUINAL HERNIA REPAIR Left 2005  . TRANSTHORACIC ECHOCARDIOGRAM  07/19/2010    Left ventricle: There is hypokinesis of the inferior wall and  posterior wall. The EF is 35-40%. The cavity size was normal    Allergies  Allergen  Reactions  . Aleve [Naproxen]   . Indocin [Indomethacin]     Outpatient Encounter Medications as of 02/18/2017  Medication Sig  . alum & mag hydroxide-simeth (MINTOX) 200-200-20 MG/5ML suspension Take 30 mLs by mouth every 4 (four) hours as needed for indigestion or heartburn.  . benzocaine (ORAJEL) 10 % mucosal gel Use as directed 1 application in the mouth or throat as needed for mouth pain.  . cholecalciferol (VITAMIN D) 1000  UNITS tablet Take 2,000 Units by mouth daily.   . finasteride (PROSCAR) 5 MG tablet Take 5 mg by mouth daily.   . fluocinonide (LIDEX) 0.05 % external solution Apply 1 application topically as needed.  . gabapentin (NEURONTIN) 100 MG capsule Take 100 mg by mouth at bedtime.  Marland Kitchen guaiFENesin (ROBITUSSIN) 100 MG/5ML liquid Take 10 CC by mouth every 4 hours as needed for cough or excessive mucus  . ketoconazole (NIZORAL) 2 % shampoo Apply 1 application topically once a week.  . levothyroxine (SYNTHROID, LEVOTHROID) 50 MCG tablet Take 50 mcg by mouth daily before breakfast.   . Lidocaine (ASPERCREME LIDOCAINE) 4 % PTCH Apply 1 patch topically daily. Leave on for 12 hours  . losartan (COZAAR) 25 MG tablet Take 25 mg by mouth daily.   . NON FORMULARY Take 1 capsule by mouth as directed. Beneflex--may self-administer and keep at bedside  . omeprazole (PRILOSEC) 20 MG capsule Take 20 mg by mouth daily.  Marland Kitchen oxybutynin (DITROPAN-XL) 5 MG 24 hr tablet Take 5 mg by mouth daily.   Marland Kitchen oxyCODONE (OXY IR/ROXICODONE) 5 MG immediate release tablet Take one tablet by mouth twice daily as needed for breakthrough pain  . oxyCODONE (OXYCONTIN) 10 mg 12 hr tablet Take one tablet by mouth every morning for pain. Do not crush or chew.  . polyethylene glycol (MIRALAX / GLYCOLAX) packet Take 17 g by mouth daily.  . polyvinyl alcohol (ARTIFICIAL TEARS) 1.4 % ophthalmic solution Place 1 drop into both eyes as needed.   . sennosides-docusate sodium (SENOKOT-S) 8.6-50 MG tablet Take 1 tablet by mouth daily as needed for constipation.  . sennosides-docusate sodium (SENOKOT-S) 8.6-50 MG tablet Take 1 tablet by mouth every other day.  . triamcinolone (KENALOG) 0.1 % paste Use as directed 1 application in the mouth or throat as directed. 3-6 times daily   No facility-administered encounter medications on file as of 02/18/2017.     Review of Systems  Constitutional: Negative for activity change, chills, fatigue and fever.  HENT:  Positive for hearing loss. Negative for congestion, rhinorrhea, sinus pressure, sinus pain, sneezing and sore throat.   Eyes: Negative for discharge, redness and itching.  Respiratory: Negative for cough, chest tightness, shortness of breath and wheezing.   Cardiovascular: Negative for chest pain, palpitations and leg swelling.  Gastrointestinal: Negative for abdominal distention, abdominal pain, constipation, diarrhea, nausea and vomiting.  Genitourinary: Negative for dysuria, frequency and urgency.  Musculoskeletal: Positive for arthralgias, back pain and gait problem.  Skin: Negative for color change, pallor and rash.  Neurological: Negative for dizziness, syncope, light-headedness and headaches.  Psychiatric/Behavioral: Negative for agitation, confusion and sleep disturbance. The patient is not nervous/anxious.     Immunization History  Administered Date(s) Administered  . Influenza-Unspecified 12/27/2010, 12/29/2011, 12/27/2012, 12/13/2013, 12/14/2014, 12/20/2015, 12/17/2016  . PPD Test 07/30/2010, 08/14/2010  . Pneumococcal Conjugate-13 11/04/2016  . Pneumococcal Polysaccharide-23 03/10/2005  . Td 03/11/1999  . Tdap 10/31/2016   Pertinent  Health Maintenance Due  Topic Date Due  . INFLUENZA VACCINE  Completed  . PNA  vac Low Risk Adult  Completed   Fall Risk  10/29/2016 08/07/2015 08/07/2015 04/10/2015 03/27/2015  Falls in the past year? Yes Yes No No No  Comment - 06/02/15- skin tear right arm - - -  Number falls in past yr: 1 - - - -  Injury with Fall? No - - - -      Vitals:   02/18/17 1355  BP: (!) 169/87  Pulse: (!) 56  Resp: (!) 21  Temp: 98.1 F (36.7 C)  Weight: 137 lb (62.1 kg)  Height: 5\' 9"  (1.753 m)   Body mass index is 20.23 kg/m. Physical Exam  Constitutional: He is oriented to person, place, and time.  Frail elderly in no acute distress   HENT:  Head: Normocephalic.  Hearing aids in place   Eyes: Conjunctivae and EOM are normal. Pupils are equal,  round, and reactive to light. Right eye exhibits no discharge. Left eye exhibits no discharge. No scleral icterus.  Neck: Normal range of motion. No JVD present. No thyromegaly present.  Cardiovascular: Intact distal pulses. Exam reveals no gallop and no friction rub.  Murmur heard. Pulmonary/Chest: Effort normal and breath sounds normal. No respiratory distress. He has no wheezes. He has no rales.  Abdominal: Soft. Bowel sounds are normal. He exhibits no distension. There is no tenderness. There is no rebound and no guarding.  Musculoskeletal: He exhibits no edema or tenderness.  Power wheelchair bound   Lymphadenopathy:    He has no cervical adenopathy.  Neurological: He is oriented to person, place, and time. Coordination normal.  Skin: Skin is warm and dry. No rash noted. No erythema. No pallor.  Psychiatric: He has a normal mood and affect.   Labs reviewed: Recent Labs    05/01/16 12/11/16  NA 138 137  137  K 4.9 5.0  5.0  BUN 32* 35*  35*  CREATININE 1.3 1.1  1.1   Recent Labs    12/11/16  AST 15  15  ALT 6*  6*  ALKPHOS 91  91   Recent Labs    05/01/16 12/11/16  WBC 7.1 5.3  5.3  HGB 10.8* 10.2*  10.2*  HCT 34* 31*  31*  PLT 178 158  158   Lab Results  Component Value Date   TSH 2.70 12/11/2016   No results found for: HGBA1C Lab Results  Component Value Date   CHOL 169 06/25/2015   HDL 51 06/25/2015   LDLCALC 102 06/25/2015   TRIG 82 06/25/2015    Significant Diagnostic Results in last 30 days:  No results found.  Assessment/Plan 1. Essential hypertension SBP log readings reviewed ranging in the 130's with occasional 160's-170's in the morning.Asymptomatic.Lower back pain could be contributing to elevated blood pressure.Will continue on Cozaar 25 mg tablet daily for now.Monitor B/p every shift x 1 week.Facility Nurse to notify provider if SBP > 160 for three consecutive readings.   2. Chronic right-sided low back pain without sciatica Pain  eases off after taking pain medication and application of lidocaine patch.continue to monitor.    Family/ staff Communication: Reviewed plan of care with patient and facility Nurse.   Labs/tests ordered: None   Ltanya Bayley C Chaddrick Brue, NP

## 2017-03-01 DIAGNOSIS — N390 Urinary tract infection, site not specified: Secondary | ICD-10-CM | POA: Diagnosis not present

## 2017-03-04 ENCOUNTER — Non-Acute Institutional Stay: Payer: Medicare PPO | Admitting: Family

## 2017-03-04 ENCOUNTER — Encounter: Payer: Self-pay | Admitting: Family

## 2017-03-04 DIAGNOSIS — R609 Edema, unspecified: Secondary | ICD-10-CM

## 2017-03-04 DIAGNOSIS — R350 Frequency of micturition: Secondary | ICD-10-CM | POA: Diagnosis not present

## 2017-03-04 NOTE — Progress Notes (Signed)
Location:  [facility] Room Number: 32 Place of Service:  ALF [phone]) Provider: Ulysee Fyock FNP-C  Oneal GroutPandey, Mahima, MD  Patient Care Team: Oneal GroutPandey, Mahima, MD as PCP - General (Internal Medicine) Jarika Robben, Donalee Citrininah C, NP as Nurse Practitioner (Family Medicine)  Extended Emergency Contact Information Primary Emergency Contact: [name],Phillip          [place], [place] Home Phone: [phone] Relation: Son Secondary Emergency Contact: [name],[name]  [place] Home Phone: [phone] Relation: Grandson  Code Status:  DNR Goals of care: Advanced Directive information Advanced Directives 03/04/2017  Does Patient Have a Medical Advance Directive? Yes  Type of Estate agentAdvance Directive Healthcare Power of [place];Out of facility DNR (pink MOST or yellow form);Living will  Does patient want to make changes to medical advance directive? -  Copy of Healthcare Power of Attorney in Chart? Yes  Pre-existing out of facility DNR order (yellow form or pink MOST form) Pink MOST form placed in chart (order not valid for inpatient use)     Chief Complaint  Patient presents with  . Acute Visit    Medication management (wants to increase fluid pill)    HPI:  Pt is a [age] y.o. male [facility] for evaluation of increased urine frequency.He is seen in his room today.He states has had increased urine frequency especially during the night.He gets up several times at night to use the bathroom.He had a urine specimen collected for U/A and C/S resulted showed no urinary tract infections and urine culture showed no growth.He states " I'm an old man and I realize things are happening with the prostate will just leave things the way they are for now".    Past Medical History:  Diagnosis Date  . AAA (abdominal aortic aneurysm) (HCC)   . Abnormality of gait 08/01/2010  . Adhesive capsulitis of shoulder 08/01/2010   "Frozen" right shoulder  . Anemia   . Anxiety state, unspecified 08/01/2010  . Arthritis   . Congestive heart failure, unspecified 08/01/2010  . Cough 08/07/2015  . Disorders of bursae and tendons in shoulder region, unspecified 08/01/2010  . Edema 08/01/2010  . Fall   . Fecal impaction (HCC)   . GERD (gastroesophageal reflux disease)   . Hiatal hernia   . Hypercalcemia   . Hyperlipidemia   . Hypertension   . Hypertrophy of prostate without urinary obstruction and other lower urinary tract symptoms (LUTS) 08/01/2010  . Hyponatremia   . Hypothyroidism   . Infectious colitis   . Insomnia, unspecified 08/01/2010  . Joint pain   . Leg pain    with walking  . Lumbago 12/24/2010  . Orthostatic hypotension 11/05/2010  . Other specified cardiac dysrhythmias(427.89) 12/24/2010  . Pain in joint, pelvic region and thigh 08/06/2010  . Peptic ulcer, unspecified site, unspecified as acute or chronic, without mention of hemorrhage, perforation, or obstruction 08/01/2010  . Renal failure, acute (HCC)   . Swelling, mass, or lump in head and neck 01/03/2011  . Ulcer    Stomach  . Unspecified disorder of kidney and ureter 08/01/2010  . Unspecified hearing loss 08/01/2010  . Unspecified vitamin D deficiency 08/06/2010  . Urinary frequency 09/13/2010  . Vertigo    Past Surgical History:  Procedure Laterality Date  . ABDOMINAL AORTIC ANEURYSM REPAIR  12-18-10   Stent graft repair of AAA  . APPENDECTOMY    . CATARACT EXTRACTION    . HEMORRHOID SURGERY  2000  .  INGUINAL HERNIA REPAIR Left 2005  . TRANSTHORACIC ECHOCARDIOGRAM  07/19/2010    Left ventricle: There is hypokinesis of the inferior wall and  posterior wall. The EF is 35-40%. The cavity size was normal    Allergies  Allergen Reactions  . Aleve [Naproxen]   . Indocin [Indomethacin]     Outpatient Encounter Medications as of 03/04/2017  Medication Sig  . alum & mag hydroxide-simeth (MINTOX) 200-200-20 MG/5ML suspension Take 30 mLs by mouth  every 4 (four) hours as needed for indigestion or heartburn.  . benzocaine (ORAJEL) 10 % mucosal gel Use as directed 1 application in the mouth or throat as needed for mouth pain.  . cholecalciferol (VITAMIN D) 1000 UNITS tablet Take 2,000 Units by mouth daily.   . finasteride (PROSCAR) 5 MG tablet Take 5 mg by mouth daily.   . fluocinonide (LIDEX) 0.05 % external solution Apply 1 application topically as needed.  . gabapentin (NEURONTIN) 100 MG capsule Take 100 mg by mouth at bedtime.  Marland Kitchen. guaiFENesin (ROBITUSSIN) 100 MG/5ML liquid Take 10 CC by mouth every 4 hours as needed for cough or excessive mucus  . ketoconazole (NIZORAL) 2 % shampoo Apply 1 application topically once a week.  . levothyroxine (SYNTHROID, LEVOTHROID) 50 MCG tablet Take 50 mcg by mouth daily before breakfast.   . Lidocaine (ASPERCREME LIDOCAINE) 4 % PTCH Apply 1 patch topically daily. Leave on for 12 hours  . losartan (COZAAR) 25 MG tablet Take 25 mg by mouth daily.   . NON FORMULARY Take 1 capsule by mouth as directed. Beneflex--may self-administer and keep at bedside  . omeprazole (PRILOSEC) 20 MG capsule Take 20 mg by mouth daily.  Marland Kitchen. oxybutynin (DITROPAN-XL) 5 MG 24 hr tablet Take 5 mg by mouth daily.   Marland Kitchen. oxyCODONE (OXY IR/ROXICODONE) 5 MG immediate release tablet Take one tablet by mouth twice daily as needed for breakthrough pain  . oxyCODONE (OXYCONTIN) 10 mg 12 hr tablet Take one tablet by mouth every morning for pain. Do not crush or chew.  . polyethylene glycol (MIRALAX / GLYCOLAX) packet Take 17 g by mouth daily.  . polyvinyl alcohol (ARTIFICIAL TEARS) 1.4 % ophthalmic solution Place 1 drop into both eyes as needed.   . sennosides-docusate sodium (SENOKOT-S) 8.6-50 MG tablet Take 1 tablet by mouth daily as needed for constipation.  . sennosides-docusate sodium (SENOKOT-S) 8.6-50 MG tablet Take 1 tablet by mouth every other day.  . triamcinolone (KENALOG) 0.1 % paste Use as directed 1 application in the mouth or  throat as directed. 3-6 times daily   No facility-administered encounter medications on file as of 03/04/2017.     Review of Systems  Constitutional: Negative for activity change, appetite change, chills, fatigue and fever.  Respiratory: Negative for cough, chest tightness, shortness of breath and wheezing.   Cardiovascular: Positive for leg swelling. Negative for chest pain and palpitations.  Gastrointestinal: Negative for abdominal distention, abdominal pain, constipation, diarrhea, nausea and vomiting.  Endocrine: Negative for polydipsia, polyphagia and polyuria.  Genitourinary: Positive for frequency. Negative for difficulty urinating, dysuria, flank pain and urgency.  Musculoskeletal: Positive for gait problem.  Hematological: Does not bruise/bleed easily.  Psychiatric/Behavioral: Negative for agitation, confusion and sleep disturbance. The patient is not nervous/anxious.     Immunization History  Administered Date(s) Administered  . Influenza-Unspecified 12/27/2010, 12/29/2011, 12/27/2012, 12/13/2013, 12/14/2014, 12/20/2015, 12/17/2016  . PPD Test 07/30/2010, 08/14/2010  . Pneumococcal Conjugate-13 11/04/2016  . Pneumococcal Polysaccharide-23 03/10/2005  . Td 03/11/1999  . Tdap 10/31/2016   Pertinent  Health Maintenance Due  Topic Date Due  . INFLUENZA VACCINE  Completed  . PNA vac Low Risk Adult  Completed   Fall Risk  10/29/2016 08/07/2015 08/07/2015 04/10/2015 03/27/2015  Falls in the past year? Yes Yes No No No  Comment - 06/02/15- skin tear right arm - - -  Number falls in past yr: 1 - - - -  Injury with Fall? No - - - -    Vitals:   03/04/17 1141  BP: (!) 141/80  Pulse: 64  Resp: 18  Temp: 98.1 F (36.7 C)  SpO2: 94%  Weight: 137 lb (62.1 kg)  Height: 5\' 9"  (1.753 m)   Body mass index is 20.23 kg/m. Physical Exam  Constitutional: He is oriented to person, place, and time.  Frail elderly in no acute distress  HENT:  Head: Normocephalic.  Mouth/Throat:  Oropharynx is clear and moist.  Hearing aids in place   Eyes: Conjunctivae and EOM are normal. Pupils are equal, round, and reactive to light. Right eye exhibits no discharge. Left eye exhibits no discharge. No scleral icterus.  Eye glasses in place   Neck: Normal range of motion. No JVD present. No thyromegaly present.  Cardiovascular: Intact distal pulses. Exam reveals no gallop and no friction rub.  Murmur heard. Pulmonary/Chest: Effort normal and breath sounds normal. No respiratory distress. He has no wheezes. He has no rales.  Abdominal: Soft. Bowel sounds are normal. He exhibits no distension. There is no tenderness. There is no rebound and no guarding.  Musculoskeletal: He exhibits no tenderness.  Unsteady gait uses power wheelchair.Trace edema to lower extremities.   Lymphadenopathy:    He has no cervical adenopathy.  Neurological: He is oriented to person, place, and time. Coordination normal.  Skin: Skin is warm and dry. No rash noted. No erythema.  Psychiatric: He has a normal mood and affect.   Labs reviewed: Recent Labs    05/01/16 12/11/16  NA 138 137  137  K 4.9 5.0  5.0  BUN 32* 35*  35*  CREATININE 1.3 1.1  1.1   Recent Labs    12/11/16  AST 15  15  ALT 6*  6*  ALKPHOS 91  91   Recent Labs    05/01/16 12/11/16  WBC 7.1 5.3  5.3  HGB 10.8* 10.2*  10.2*  HCT 34* 31*  31*  PLT 178 158  158   Lab Results  Component Value Date   TSH 2.70 12/11/2016   No results found for: HGBA1C Lab Results  Component Value Date   CHOL 169 06/25/2015   HDL 51 06/25/2015   LDLCALC 102 06/25/2015   TRIG 82 06/25/2015    Significant Diagnostic Results in last 30 days:  No results found.  Assessment/Plan 1. Urine frequency U/A and C/S negative for urinary tract infections.His BPH could contributory to symptoms.Continue on finasteride 5 mg tablet daily and oxybutynin 5 mg tablet daily.continue to monitor.     2. Edema Bilateral lower extremities trace  edema.Weight stable.Lungs CTA and no cough noted. Apply bilateral knee high ted hose on in the morning and off at bedtime.  Family/ staff Communication: Reviewed plan of care with patient and facility Nurse.  Labs/tests ordered: None   Nakenya Theall C Gwenith Tschida, NP

## 2017-03-11 ENCOUNTER — Encounter: Payer: Self-pay | Admitting: Family

## 2017-03-11 ENCOUNTER — Non-Acute Institutional Stay: Payer: Medicare PPO | Admitting: Family

## 2017-03-11 DIAGNOSIS — K219 Gastro-esophageal reflux disease without esophagitis: Secondary | ICD-10-CM

## 2017-03-11 DIAGNOSIS — M150 Primary generalized (osteo)arthritis: Secondary | ICD-10-CM

## 2017-03-11 DIAGNOSIS — H6123 Impacted cerumen, bilateral: Secondary | ICD-10-CM | POA: Diagnosis not present

## 2017-03-11 DIAGNOSIS — E039 Hypothyroidism, unspecified: Secondary | ICD-10-CM

## 2017-03-11 DIAGNOSIS — I10 Essential (primary) hypertension: Secondary | ICD-10-CM

## 2017-03-11 DIAGNOSIS — S51811A Laceration without foreign body of right forearm, initial encounter: Secondary | ICD-10-CM | POA: Diagnosis not present

## 2017-03-11 DIAGNOSIS — K5901 Slow transit constipation: Secondary | ICD-10-CM

## 2017-03-11 DIAGNOSIS — J342 Deviated nasal septum: Secondary | ICD-10-CM | POA: Diagnosis not present

## 2017-03-11 DIAGNOSIS — M159 Polyosteoarthritis, unspecified: Secondary | ICD-10-CM
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
## 2017-03-23 ENCOUNTER — Non-Acute Institutional Stay: Payer: Medicare PPO | Admitting: Family

## 2017-03-23 ENCOUNTER — Encounter: Payer: Self-pay | Admitting: Family

## 2017-03-23 DIAGNOSIS — R21 Rash and other nonspecific skin eruption: Secondary | ICD-10-CM | POA: Diagnosis not present

## 2017-03-23 NOTE — Progress Notes (Signed)
Location:  [facility] Room Number: 32 Place of Service:  ALF [phone]) Provider: Dinah Ngetich FNP-C  Oneal Grout, MD  Patient Care Team: Oneal Grout, MD as PCP - General (Internal Medicine) Ngetich, Donalee Citrin, NP as Nurse Practitioner (Family Medicine)  Extended Emergency Contact Information Primary Emergency Contact: [name], [place] Home Phone: [phone] Relation: Son Secondary Emergency Contact: [name] Home Phone: [phone] Relation: Grandson  Code Status:  Full Code/MOST Form  Goals of care: Advanced Directive information Advanced Directives 03/23/2017  Does Patient Have a Medical Advance Directive? Yes  Type of Advance Directive Out of facility DNR (pink MOST or yellow form);Healthcare Power of [place];Living will  Does patient want to make changes to medical advance directive? -  Copy of Healthcare Power of Attorney in Chart? Yes  Pre-existing out of facility DNR order (yellow form or pink MOST form) Pink MOST form placed in chart (order not valid for inpatient use)     Chief Complaint  Patient presents with  . Acute Visit    itchy rash on back    HPI:  Pt is a 84 y.o. male seen today at [facility] for an acute visit for evaluation of itchy rash on the back.He is seen in his room today per facility Nurse request. Facility Nurse Cala Bradford present at bedside states patient complains of itchy rash on the back for the past two days.Hydrocortisone cream was applied with much improvement today. He denies any fever or chills.No change of lotion or detergent reported.   Past Medical History:  Diagnosis Date  . AAA (abdominal aortic aneurysm) (HCC)   . Abnormality of gait 08/01/2010  . Adhesive capsulitis of shoulder 08/01/2010   "Frozen" right shoulder  . Anemia   . Anxiety state, unspecified 08/01/2010  . Arthritis   . Congestive heart failure, unspecified  08/01/2010  . Cough 08/07/2015  . Disorders of bursae and tendons in shoulder region, unspecified 08/01/2010  . Edema 08/01/2010  . Fall   . Fecal impaction (HCC)   . GERD (gastroesophageal reflux disease)   . Hiatal hernia   . Hypercalcemia   . Hyperlipidemia   . Hypertension   . Hypertrophy of prostate without urinary obstruction and other lower urinary tract symptoms (LUTS) 08/01/2010  . Hyponatremia   . Hypothyroidism   . Infectious colitis   . Insomnia, unspecified 08/01/2010  . Joint pain   . Leg pain    with walking  . Lumbago 12/24/2010  . Orthostatic hypotension 11/05/2010  . Other specified cardiac dysrhythmias(427.89) 12/24/2010  . Pain in joint, pelvic region and thigh 08/06/2010  . Peptic ulcer, unspecified site, unspecified as acute or chronic, without mention of hemorrhage, perforation, or obstruction 08/01/2010  . Renal failure, acute (HCC)   . Swelling, mass, or lump in head and neck 01/03/2011  . Ulcer    Stomach  . Unspecified disorder of kidney and ureter 08/01/2010  . Unspecified hearing loss 08/01/2010  . Unspecified vitamin D deficiency 08/06/2010  . Urinary frequency 09/13/2010  . Vertigo    Past Surgical History:  Procedure Laterality Date  . ABDOMINAL AORTIC ANEURYSM REPAIR  12-18-10   Stent graft repair of AAA  . APPENDECTOMY    . CATARACT EXTRACTION    . HEMORRHOID SURGERY  2000  . INGUINAL HERNIA REPAIR Left 2005  . TRANSTHORACIC ECHOCARDIOGRAM  07/19/2010    Left ventricle: There is hypokinesis  of the inferior wall and  posterior wall. The EF is 35-40%. The cavity size was normal    Allergies  Allergen Reactions  . Aleve [Naproxen]   . Indocin [Indomethacin]     Outpatient Encounter Medications as of 03/23/2017  Medication Sig  . acetaminophen (TYLENOL) 325 MG tablet Take 650 mg by mouth every 4 (four) hours as needed.  Marland Kitchen alum & mag hydroxide-simeth (MINTOX) 200-200-20 MG/5ML suspension Take 30 mLs by mouth every 4 (four) hours as needed for  indigestion or heartburn.  . benzocaine (ORAJEL) 10 % mucosal gel Use as directed 1 application in the mouth or throat as needed for mouth pain.  . cholecalciferol (VITAMIN D) 1000 UNITS tablet Take 2,000 Units by mouth daily.   . finasteride (PROSCAR) 5 MG tablet Take 5 mg by mouth daily.   . fluocinonide (LIDEX) 0.05 % external solution Apply 1 application topically as needed.  . gabapentin (NEURONTIN) 100 MG capsule Take 100 mg by mouth at bedtime.  Marland Kitchen guaiFENesin (ROBITUSSIN) 100 MG/5ML liquid Take 10 CC by mouth every 4 hours as needed for cough or excessive mucus  . ketoconazole (NIZORAL) 2 % shampoo Apply 1 application topically once a week.  . levothyroxine (SYNTHROID, LEVOTHROID) 50 MCG tablet Take 50 mcg by mouth daily before breakfast.   . Lidocaine (ASPERCREME LIDOCAINE) 4 % PTCH Apply 1 patch topically daily. Leave on for 12 hours  . losartan (COZAAR) 25 MG tablet Take 25 mg by mouth daily.   . NON FORMULARY Take 1 capsule by mouth as directed. Beneflex--may self-administer and keep at bedside  . omeprazole (PRILOSEC) 20 MG capsule Take 20 mg by mouth daily.  Marland Kitchen oxybutynin (DITROPAN-XL) 5 MG 24 hr tablet Take 5 mg by mouth daily.   Marland Kitchen oxyCODONE (OXY IR/ROXICODONE) 5 MG immediate release tablet Take one tablet by mouth twice daily as needed for breakthrough pain  . oxyCODONE (OXYCONTIN) 10 mg 12 hr tablet Take one tablet by mouth every morning for pain. Do not crush or chew.  . polyethylene glycol (MIRALAX / GLYCOLAX) packet Take 17 g by mouth daily.  . polyvinyl alcohol (ARTIFICIAL TEARS) 1.4 % ophthalmic solution Place 1 drop into both eyes as needed.   . sennosides-docusate sodium (SENOKOT-S) 8.6-50 MG tablet Take 1 tablet by mouth daily as needed for constipation.  . sennosides-docusate sodium (SENOKOT-S) 8.6-50 MG tablet Take 1 tablet by mouth every other day.  . triamcinolone (KENALOG) 0.1 % paste Use as directed 1 application in the mouth or throat as directed. 3-6 times daily    No facility-administered encounter medications on file as of 03/23/2017.     Review of Systems  Constitutional: Negative for chills, fatigue and fever.  HENT: Positive for hearing loss. Negative for congestion, rhinorrhea, sinus pressure, sinus pain, sneezing and sore throat.   Eyes: Negative for redness and itching.       Wears eye glasses   Respiratory: Negative for cough, chest tightness, shortness of breath and wheezing.   Cardiovascular: Negative for chest pain, palpitations and leg swelling.  Gastrointestinal: Negative for abdominal distention and abdominal pain.  Musculoskeletal:       Chronic back pain   Skin: Positive for rash. Negative for color change and pallor.  Neurological: Negative for dizziness, weakness, light-headedness and headaches.  Hematological: Does not bruise/bleed easily.  Psychiatric/Behavioral: Negative for agitation, confusion and sleep disturbance.    Immunization History  Administered Date(s) Administered  . Influenza-Unspecified 12/27/2010, 12/29/2011, 12/27/2012, 12/13/2013, 12/14/2014, 12/20/2015, 12/17/2016  . PPD Test  07/30/2010, 08/14/2010  . Pneumococcal Conjugate-13 11/04/2016  . Pneumococcal Polysaccharide-23 03/10/2005  . Td 03/11/1999  . Tdap 10/31/2016   Pertinent  Health Maintenance Due  Topic Date Due  . INFLUENZA VACCINE  Completed  . PNA vac Low Risk Adult  Completed   Fall Risk  10/29/2016 08/07/2015 08/07/2015 04/10/2015 03/27/2015  Falls in the past year? Yes Yes No No No  Comment - 06/02/15- skin tear right arm - - -  Number falls in past yr: 1 - - - -  Injury with Fall? No - - - -   Functional Status Survey:    Vitals:   03/23/17 1138  BP: (!) 166/83  Pulse: 62  Resp: 16  Temp: 98 F (36.7 C)  SpO2: 97%  Weight: 137 lb (62.1 kg)  Height: 5\' 9"  (1.753 m)   Body mass index is 20.23 kg/m. Physical Exam  Constitutional: He is oriented to person, place, and time.  Thin frail elderly in no acute distress   HENT:   Head: Normocephalic.  Mouth/Throat: Oropharynx is clear and moist. No oropharyngeal exudate.  Cardiovascular: Exam reveals no gallop and no friction rub.  Murmur heard. Pulmonary/Chest: Effort normal and breath sounds normal. No respiratory distress. He has no wheezes. He has no rales.  Neurological: He is oriented to person, place, and time.  Skin: Skin is warm and dry.  Red non-raised rash on back.   Psychiatric: He has a normal mood and affect.    Labs reviewed: Recent Labs    05/01/16 12/11/16  NA 138 137  137  K 4.9 5.0  5.0  BUN 32* 35*  35*  CREATININE 1.3 1.1  1.1   Recent Labs    12/11/16  AST 15  15  ALT 6*  6*  ALKPHOS 91  91   Recent Labs    05/01/16 12/11/16  WBC 7.1 5.3  5.3  HGB 10.8* 10.2*  10.2*  HCT 34* 31*  31*  PLT 178 158  158   Lab Results  Component Value Date   TSH 2.70 12/11/2016   No results found for: HGBA1C Lab Results  Component Value Date   CHOL 169 06/25/2015   HDL 51 06/25/2015   LDLCALC 102 06/25/2015   TRIG 82 06/25/2015    Significant Diagnostic Results in last 30 days:  No results found.  Assessment/Plan Rash and nonspecific skin eruption Afebrile.non-raised red rash to back.has improved with hydrocortisone cream per facility Nurse.continue on Hydrocortisone 1 % cream apply to affected areas on the back twice daily x 7 days.continue to monitor.   Family/ staff Communication: Reviewed plan of care with patient and facility Nurse.   Labs/tests ordered: None   Dinah C Ngetich, NP

## 2017-04-05 ENCOUNTER — Emergency Department (HOSPITAL_COMMUNITY): Payer: Medicare PPO

## 2017-04-05 ENCOUNTER — Encounter: Payer: Self-pay | Admitting: Internal Medicine

## 2017-04-05 ENCOUNTER — Inpatient Hospital Stay (HOSPITAL_COMMUNITY)
Admission: EM | Admit: 2017-04-05 | Discharge: 2017-04-08 | DRG: 388 | Disposition: A | Discharge status: Nursing Home (Includes ICF) | Payer: Medicare PPO | Attending: Internal Medicine | Admitting: Internal Medicine

## 2017-04-05 ENCOUNTER — Telehealth: Payer: Self-pay | Admitting: Internal Medicine

## 2017-04-05 DIAGNOSIS — R000 Tachycardia, unspecified: Secondary | ICD-10-CM

## 2017-04-05 DIAGNOSIS — Z79891 Long term (current) use of opiate analgesic: Secondary | ICD-10-CM | POA: Diagnosis not present

## 2017-04-05 DIAGNOSIS — N400 Enlarged prostate without lower urinary tract symptoms: Secondary | ICD-10-CM | POA: Diagnosis present

## 2017-04-05 DIAGNOSIS — I712 Thoracic aortic aneurysm, without rupture: Secondary | ICD-10-CM | POA: Diagnosis present

## 2017-04-05 DIAGNOSIS — I495 Sick sinus syndrome: Secondary | ICD-10-CM | POA: Diagnosis present

## 2017-04-05 DIAGNOSIS — R1030 Lower abdominal pain, unspecified: Secondary | ICD-10-CM | POA: Diagnosis not present

## 2017-04-05 DIAGNOSIS — K279 Peptic ulcer, site unspecified, unspecified as acute or chronic, without hemorrhage or perforation: Secondary | ICD-10-CM | POA: Diagnosis present

## 2017-04-05 DIAGNOSIS — I714 Abdominal aortic aneurysm, without rupture, unspecified: Secondary | ICD-10-CM | POA: Diagnosis present

## 2017-04-05 DIAGNOSIS — I129 Hypertensive chronic kidney disease with stage 1 through stage 4 chronic kidney disease, or unspecified chronic kidney disease: Secondary | ICD-10-CM | POA: Diagnosis present

## 2017-04-05 DIAGNOSIS — Z66 Do not resuscitate: Secondary | ICD-10-CM | POA: Diagnosis present

## 2017-04-05 DIAGNOSIS — R1031 Right lower quadrant pain: Secondary | ICD-10-CM | POA: Diagnosis not present

## 2017-04-05 DIAGNOSIS — E039 Hypothyroidism, unspecified: Secondary | ICD-10-CM | POA: Diagnosis present

## 2017-04-05 DIAGNOSIS — K449 Diaphragmatic hernia without obstruction or gangrene: Secondary | ICD-10-CM | POA: Diagnosis present

## 2017-04-05 DIAGNOSIS — I350 Nonrheumatic aortic (valve) stenosis: Secondary | ICD-10-CM | POA: Diagnosis present

## 2017-04-05 DIAGNOSIS — Z9849 Cataract extraction status, unspecified eye: Secondary | ICD-10-CM

## 2017-04-05 DIAGNOSIS — K219 Gastro-esophageal reflux disease without esophagitis: Secondary | ICD-10-CM | POA: Diagnosis present

## 2017-04-05 DIAGNOSIS — K8020 Calculus of gallbladder without cholecystitis without obstruction: Secondary | ICD-10-CM | POA: Diagnosis present

## 2017-04-05 DIAGNOSIS — I447 Left bundle-branch block, unspecified: Secondary | ICD-10-CM | POA: Diagnosis present

## 2017-04-05 DIAGNOSIS — N183 Chronic kidney disease, stage 3 (moderate): Secondary | ICD-10-CM | POA: Diagnosis present

## 2017-04-05 DIAGNOSIS — K567 Ileus, unspecified: Secondary | ICD-10-CM | POA: Diagnosis present

## 2017-04-05 DIAGNOSIS — D638 Anemia in other chronic diseases classified elsewhere: Secondary | ICD-10-CM | POA: Diagnosis present

## 2017-04-05 DIAGNOSIS — Z8679 Personal history of other diseases of the circulatory system: Secondary | ICD-10-CM | POA: Diagnosis not present

## 2017-04-05 DIAGNOSIS — H9190 Unspecified hearing loss, unspecified ear: Secondary | ICD-10-CM | POA: Diagnosis present

## 2017-04-05 DIAGNOSIS — E43 Unspecified severe protein-calorie malnutrition: Secondary | ICD-10-CM | POA: Diagnosis present

## 2017-04-05 DIAGNOSIS — F411 Generalized anxiety disorder: Secondary | ICD-10-CM | POA: Diagnosis present

## 2017-04-05 DIAGNOSIS — I248 Other forms of acute ischemic heart disease: Secondary | ICD-10-CM | POA: Diagnosis present

## 2017-04-05 DIAGNOSIS — I351 Nonrheumatic aortic (valve) insufficiency: Secondary | ICD-10-CM | POA: Diagnosis not present

## 2017-04-05 DIAGNOSIS — Z8249 Family history of ischemic heart disease and other diseases of the circulatory system: Secondary | ICD-10-CM

## 2017-04-05 DIAGNOSIS — E785 Hyperlipidemia, unspecified: Secondary | ICD-10-CM | POA: Diagnosis present

## 2017-04-05 DIAGNOSIS — E46 Unspecified protein-calorie malnutrition: Secondary | ICD-10-CM | POA: Diagnosis not present

## 2017-04-05 DIAGNOSIS — R112 Nausea with vomiting, unspecified: Secondary | ICD-10-CM | POA: Diagnosis present

## 2017-04-05 DIAGNOSIS — R109 Unspecified abdominal pain: Secondary | ICD-10-CM | POA: Diagnosis present

## 2017-04-05 DIAGNOSIS — Z888 Allergy status to other drugs, medicaments and biological substances status: Secondary | ICD-10-CM

## 2017-04-05 LAB — APTT: aPTT: 27 seconds (ref 24–36)

## 2017-04-05 LAB — COMPREHENSIVE METABOLIC PANEL
ALK PHOS: 104 U/L (ref 38–126)
ALT: 11 U/L — AB (ref 17–63)
ANION GAP: 12 (ref 5–15)
AST: 29 U/L (ref 15–41)
Albumin: 3.8 g/dL (ref 3.5–5.0)
BUN: 24 mg/dL — AB (ref 6–20)
CO2: 24 mmol/L (ref 22–32)
Calcium: 9.3 mg/dL (ref 8.9–10.3)
Chloride: 101 mmol/L (ref 101–111)
Creatinine, Ser: 1.23 mg/dL (ref 0.61–1.24)
GFR calc Af Amer: 53 mL/min — ABNORMAL LOW (ref >60)
GFR calc non Af Amer: 46 mL/min — ABNORMAL LOW (ref >60)
Glucose, Bld: 163 mg/dL — ABNORMAL HIGH (ref 65–99)
POTASSIUM: 4.6 mmol/L (ref 3.5–5.1)
Sodium: 137 mmol/L (ref 135–145)
Total Bilirubin: 0.9 mg/dL (ref 0.3–1.2)
Total Protein: 7.1 g/dL (ref 6.5–8.1)

## 2017-04-05 LAB — CBC WITH DIFFERENTIAL/PLATELET
BASOS ABS: 0 10*3/uL (ref 0.0–0.1)
Basophils Relative: 0 %
Eosinophils Absolute: 0 10*3/uL (ref 0.0–0.7)
Eosinophils Relative: 0 %
HEMATOCRIT: 36.6 % — AB (ref 39.0–52.0)
Hemoglobin: 12 g/dL — ABNORMAL LOW (ref 13.0–17.0)
LYMPHS PCT: 9 %
Lymphs Abs: 0.7 10*3/uL (ref 0.7–4.0)
MCH: 29.1 pg (ref 26.0–34.0)
MCHC: 32.8 g/dL (ref 30.0–36.0)
MCV: 88.6 fL (ref 78.0–100.0)
MONOS PCT: 6 %
Monocytes Absolute: 0.5 10*3/uL (ref 0.1–1.0)
NEUTROS ABS: 6.3 10*3/uL (ref 1.7–7.7)
NEUTROS PCT: 85 %
Platelets: 160 10*3/uL (ref 150–400)
RBC: 4.13 MIL/uL — AB (ref 4.22–5.81)
RDW: 13.9 % (ref 11.5–15.5)
WBC: 7.5 10*3/uL (ref 4.0–10.5)

## 2017-04-05 LAB — BASIC METABOLIC PANEL
BUN: 30 — AB (ref 4–21)
Creatinine: 1.2 (ref 0.6–1.3)
Glucose: 170
POTASSIUM: 4.9 (ref 3.4–5.3)
Sodium: 138 (ref 137–147)

## 2017-04-05 LAB — I-STAT CG4 LACTIC ACID, ED
Lactic Acid, Venous: 2.68 mmol/L (ref 0.5–1.9)
Lactic Acid, Venous: 2.16 mmol/L (ref 0.5–1.9)

## 2017-04-05 LAB — I-STAT TROPONIN, ED: TROPONIN I, POC: 0.1 ng/mL — AB (ref 0.00–0.08)

## 2017-04-05 LAB — PROTIME-INR
INR: 1.11
PROTHROMBIN TIME: 14.2 s (ref 11.4–15.2)

## 2017-04-05 LAB — MRSA PCR SCREENING: MRSA by PCR: NEGATIVE

## 2017-04-05 MED ORDER — METOPROLOL TARTRATE 12.5 MG HALF TABLET
12.5000 mg | ORAL_TABLET | Freq: Two times a day (BID) | ORAL | Status: DC
  Administered 2017-04-05 – 2017-04-06 (×2): 12.5 mg via ORAL
  Filled 2017-04-05 (×2): qty 1

## 2017-04-05 MED ORDER — HYDRALAZINE HCL 20 MG/ML IJ SOLN: 5.0000 mg | Freq: Four times a day (QID) | INTRAMUSCULAR | Status: DC | PRN

## 2017-04-05 MED ORDER — IOPAMIDOL (ISOVUE-370) INJECTION 76%
INTRAVENOUS | Status: AC
  Administered 2017-04-05: 100 mL via INTRAVENOUS
  Filled 2017-04-05: qty 100

## 2017-04-05 MED ORDER — SODIUM CHLORIDE 0.9 % IV SOLN
Freq: Once | INTRAVENOUS | Status: AC
  Administered 2017-04-05: 23:00:00 via INTRAVENOUS

## 2017-04-05 MED ORDER — LABETALOL HCL 5 MG/ML IV SOLN
2.5000 mg | Freq: Once | INTRAVENOUS | Status: AC
  Administered 2017-04-05: 2.5 mg via INTRAVENOUS
  Filled 2017-04-05: qty 4

## 2017-04-05 MED ORDER — ASPIRIN EC 81 MG PO TBEC
81.0000 mg | DELAYED_RELEASE_TABLET | Freq: Every day | ORAL | Status: AC
  Administered 2017-04-05: 81 mg via ORAL
  Filled 2017-04-05: qty 1

## 2017-04-05 NOTE — ED Triage Notes (Signed)
[facility] mid back pain. Pt hard of hearing hx obtained through writing on paper. Pt reports abd pain for 8-10, currently being treated with flagyl and cipro for diverticulitis. EMS reports episode of tan/brown emesis in route. Pt having intermittent ST in route. Resp e/u; nad. EDP at bedside.

## 2017-04-05 NOTE — Telephone Encounter (Signed)
84 [facility], full code, with RLQ abd pain, nausea since last night, h/o diverticulosis, h/o AAA s/p repair, hernia s/p repair.  Initially given maalox and oxycodone last night, but still having pain this am.  No bm x 2d, but typical for him.  Gave self dulcolax supp, but no bm.  Rectal vault negative on nursing assessment.  VS all normal except hypertension to 177/83, T98.3. Orders given:  Monitor vitals for fever, obtain cbc, bmp, hep panel, start cipro and flagyl for 7 days and florastor, SBAR for provider to see in am.  Needs CT abd/pelvis which would not get done until at least tomorrow if stays in facility.   Discussed possible transfer to rehab but not felt necessary as pt getting up to restroom himself and looking quite good outside of abdominal pain.  Discussed hospitalization, but we opted to keep him in AL and start treatment there.  If he declines, would transfer out.   Dajha Urquilla L. Rashod Gougeon, D.O. Geriatrics [facility] [address] [place] [place], [place] [address] Cell Phone (Mon-Fri 8am-5pm):  [phone] On Call:  [phone] & follow prompts after 5pm & weekends Office Phone:  [phone] Office Fax:  [phone]

## 2017-04-05 NOTE — ED Notes (Signed)
I stat lactic acid results given to Dr. Rush Landmarkegeler by Morrell RiddleB Wells Mabe, EMT

## 2017-04-05 NOTE — ED Notes (Signed)
Attempted to call report

## 2017-04-05 NOTE — H&P (Addendum)
TRH H&P   Patient Demographics:    Ricardo [name], is a 84 y.o. male  MRN: [id number]   DOB - [birth year]  Admit Date - 04/05/2017  Outpatient Primary MD for the patient is Oneal Grout, MD  Referring MD/NP/PA:  Harolyn Rutherford  Outpatient Specialists:     Patient coming from: [facility]  Chief Complaint  Patient presents with  . Abdominal Pain      HPI:    Ricardo [name]  is a 84 y.o. male, w hypothyroidism, hypertension, hyperlipidemia, PUD, CHF, AAA, presents with c/o intractable nausea and vomitting today as well as some right lower abdominal discomfort and constipation.  Pt denies fever, chills, cough, cp, palp, heartburn, diarrhea, brbpr, black stool .  Pt presented to ED for evaluation.   In Ed,  CTA chest / abd / pelvis IMPRESSION: 4.3 cm ascending thoracic aortic aneurysm. No evidence of aortic dissection. Previous stent graft repair of abdominal aortic aneurysm. Native aneurysm sac now measures 8.4 cm in maximum diameter, compared to 6.7 cm in 2015. No evidence of endoleak or retroperitoneal hemorrhage. Stable large hiatal hernia. Probable colonic ileus. Cholelithiasis.  No radiographic evidence of cholecystitis.  Wbc 7.5, Hgb 12.0, Plt 160 Na 137, K 4.6, Bun 24, Creatinine 1.23 Ast 29, Alt 11 Glucose 163 INR 1.11 PTT 27  Trop 0.10    EKG Afib at 90 nl axis LAD, LBBB, slight st depression V6  Pt will be admitted for nausea and vomitting and worsening AAA, and slight troponin elevation.         Review of systems:    In addition to the HPI above, No Fever-chills, No Headache, No changes with Vision or hearing, No problems swallowing food or Liquids, No Chest pain, Cough or Shortness of Breath,  No Blood in stool or Urine, No dysuria, No new skin rashes or bruises, No new joints pains-aches,  No new weakness, tingling, numbness in any  extremity, No recent weight gain or loss, No polyuria, polydypsia or polyphagia, No significant Mental Stressors.  A full 10 point Review of Systems was done, except as stated above, all other Review of Systems were negative.   With Past History of the following :    Past Medical History:  Diagnosis Date  . AAA (abdominal aortic aneurysm) (HCC)   . Abnormality of gait 08/01/2010  . Adhesive capsulitis of shoulder 08/01/2010   "Frozen" right shoulder  . Anemia   . Anxiety state, unspecified 08/01/2010  . Arthritis   . Congestive heart failure, unspecified 08/01/2010  . Cough 08/07/2015  . Disorders of bursae and tendons in shoulder region, unspecified 08/01/2010  . Edema 08/01/2010  . Fall   . Fecal impaction (HCC)   . GERD (gastroesophageal reflux disease)   . Hiatal hernia   . Hypercalcemia   . Hyperlipidemia   . Hypertension   . Hypertrophy of prostate without urinary obstruction  and other lower urinary tract symptoms (LUTS) 08/01/2010  . Hyponatremia   . Hypothyroidism   . Infectious colitis   . Insomnia, unspecified 08/01/2010  . Joint pain   . Leg pain    with walking  . Lumbago 12/24/2010  . Orthostatic hypotension 11/05/2010  . Other specified cardiac dysrhythmias(427.89) 12/24/2010  . Pain in joint, pelvic region and thigh 08/06/2010  . Peptic ulcer, unspecified site, unspecified as acute or chronic, without mention of hemorrhage, perforation, or obstruction 08/01/2010  . Renal failure, acute (HCC)   . Swelling, mass, or lump in head and neck 01/03/2011  . Ulcer    Stomach  . Unspecified disorder of kidney and ureter 08/01/2010  . Unspecified hearing loss 08/01/2010  . Unspecified vitamin D deficiency 08/06/2010  . Urinary frequency 09/13/2010  . Vertigo       Past Surgical History:  Procedure Laterality Date  . ABDOMINAL AORTIC ANEURYSM REPAIR  12-18-10   Stent graft repair of AAA  . APPENDECTOMY    . CATARACT EXTRACTION    . HEMORRHOID SURGERY  2000  . INGUINAL  HERNIA REPAIR Left 2005  . TRANSTHORACIC ECHOCARDIOGRAM  07/19/2010    Left ventricle: There is hypokinesis of the inferior wall and  posterior wall. The EF is 35-40%. The cavity size was normal      Social History:     Social History   Tobacco Use  . Smoking status: Never Smoker  . Smokeless tobacco: Never Used  Substance Use Topics  . Alcohol use: Yes    Alcohol/week: 0.0 oz    Comment: 1 1/2 oz daily      Lives - at [facility]  Mobility - walks by self   Family History :     Family History  Problem Relation Age of Onset  . Heart failure Brother       Home Medications:   Prior to Admission medications   Medication Sig Start Date End Date Taking? Authorizing Provider  acetaminophen (TYLENOL) 325 MG tablet Take 650 mg by mouth every 4 (four) hours as needed.    [provider]  alum & mag hydroxide-simeth (MINTOX) 200-200-20 MG/5ML suspension Take 30 mLs by mouth every 4 (four) hours as needed for indigestion or heartburn.    [provider]  benzocaine (ORAJEL) 10 % mucosal gel Use as directed 1 application in the mouth or throat as needed for mouth pain.    [provider]  cholecalciferol (VITAMIN D) 1000 UNITS tablet Take 2,000 Units by mouth daily.     [provider]  finasteride (PROSCAR) 5 MG tablet Take 5 mg by mouth daily.  01/25/13   [provider]  fluocinonide (LIDEX) 0.05 % external solution Apply 1 application topically as needed.    [provider]  gabapentin (NEURONTIN) 100 MG capsule Take 100 mg by mouth at bedtime.    [provider]  guaiFENesin (ROBITUSSIN) 100 MG/5ML liquid Take 10 CC by mouth every 4 hours as needed for cough or excessive mucus    [provider]  ketoconazole (NIZORAL) 2 % shampoo Apply 1 application topically once a week.    [provider]  levothyroxine (SYNTHROID, LEVOTHROID) 50 MCG tablet Take 50 mcg by mouth daily before breakfast.  01/10/13    [provider]  Lidocaine (ASPERCREME LIDOCAINE) 4 % PTCH Apply 1 patch topically daily. Leave on for 12 hours    [provider]  losartan (COZAAR) 25 MG tablet Take 25 mg by mouth  daily.  12/31/12   [provider]  NON FORMULARY Take 1 capsule by mouth as directed. Beneflex--may self-administer and keep at bedside    [provider]  omeprazole (PRILOSEC) 20 MG capsule Take 20 mg by mouth daily.    [provider]  oxybutynin (DITROPAN-XL) 5 MG 24 hr tablet Take 5 mg by mouth daily.     [provider]  oxyCODONE (OXY IR/ROXICODONE) 5 MG immediate release tablet Take one tablet by mouth twice daily as needed for breakthrough pain 11/03/16   Ngetich, Dinah C, NP  oxyCODONE (OXYCONTIN) 10 mg 12 hr tablet Take one tablet by mouth every morning for pain. Do not crush or chew. 01/23/17   Ngetich, Dinah C, NP  polyethylene glycol (MIRALAX / GLYCOLAX) packet Take 17 g by mouth daily.    [provider]  polyvinyl alcohol (ARTIFICIAL TEARS) 1.4 % ophthalmic solution Place 1 drop into both eyes as needed.     [provider]  sennosides-docusate sodium (SENOKOT-S) 8.6-50 MG tablet Take 1 tablet by mouth daily as needed for constipation.    [provider]  sennosides-docusate sodium (SENOKOT-S) 8.6-50 MG tablet Take 1 tablet by mouth every other day.    [provider]  triamcinolone (KENALOG) 0.1 % paste Use as directed 1 application in the mouth or throat as directed. 3-6 times daily    [provider]     Allergies:     Allergies  Allergen Reactions  . Aleve [Naproxen]   . Indocin [Indomethacin]      Physical Exam:   Vitals  Blood pressure (!) 132/96, pulse (!) 105, temperature 97.6 F (36.4 C), temperature source Oral, resp. rate (!) 24, SpO2 97 %.   1. General  lying in bed in NAD,    2. Normal affect and insight, Not Suicidal or Homicidal, Awake Alert, Oriented X 3.  3. No F.N  deficits, ALL C.Nerves Intact, Strength 5/5 all 4 extremities, Sensation intact all 4 extremities, Plantars down going.  4. Ears and Eyes appear Normal, Conjunctivae clear, PERRLA. Moist Oral Mucosa.  5. Supple Neck, No JVD, No cervical lymphadenopathy appriciated, No Carotid Bruits.  6. Symmetrical Chest wall movement, Good air movement bilaterally, CTAB.  7. Irr, irr, s1, s2, 2/6 sem rusb   8. Positive Bowel Sounds, Abdomen Soft, No tenderness, No organomegaly appriciated,No rebound -guarding or rigidity.  9.  No Cyanosis, Normal Skin Turgor, No Skin Rash or Bruise.  10. Good muscle tone,  joints appear normal , no effusions, Normal ROM.  11. No Palpable Lymph Nodes in Neck or Axillae   + umbilical hernia   Data Review:    CBC Recent Labs  Lab 04/05/17 1846  WBC 7.5  HGB 12.0*  HCT 36.6*  PLT 160  MCV 88.6  MCH 29.1  MCHC 32.8  RDW 13.9  LYMPHSABS 0.7  MONOABS 0.5  EOSABS 0.0  BASOSABS 0.0   ------------------------------------------------------------------------------------------------------------------  Chemistries  Recent Labs  Lab 04/05/17 1846  NA 137  K 4.6  CL 101  CO2 24  GLUCOSE 163*  BUN 24*  CREATININE 1.23  CALCIUM 9.3  AST 29  ALT 11*  ALKPHOS 104  BILITOT 0.9   ------------------------------------------------------------------------------------------------------------------ estimated creatinine clearance is 27.3 mL/min (by C-G formula based on SCr of 1.23 mg/dL). ------------------------------------------------------------------------------------------------------------------ No results for input(s): TSH, T4TOTAL, T3FREE, THYROIDAB in the last 72 hours.  Invalid input(s): FREET3  Coagulation profile Recent Labs  Lab 04/05/17 1846  INR 1.11   ------------------------------------------------------------------------------------------------------------------- No results  for input(s): DDIMER in the last 72  hours. -------------------------------------------------------------------------------------------------------------------  Cardiac Enzymes No results for input(s): CKMB, TROPONINI, MYOGLOBIN in the last 168 hours.  Invalid input(s): CK ------------------------------------------------------------------------------------------------------------------    Component Value Date/Time   BNP 285.2 (H) 01/06/2016 1950     ---------------------------------------------------------------------------------------------------------------  Urinalysis    Component Value Date/Time   COLORURINE YELLOW 01/06/2016 2012   APPEARANCEUR CLEAR 01/06/2016 2012   LABSPEC 1.016 01/06/2016 2012   PHURINE 6.0 01/06/2016 2012   GLUCOSEU NEGATIVE 01/06/2016 2012   HGBUR MODERATE (A) 01/06/2016 2012   BILIRUBINUR NEGATIVE 01/06/2016 2012   KETONESUR NEGATIVE 01/06/2016 2012   PROTEINUR 100 (A) 01/06/2016 2012   UROBILINOGEN 1.0 12/13/2010 1129   NITRITE NEGATIVE 01/06/2016 2012   LEUKOCYTESUR NEGATIVE 01/06/2016 2012    ----------------------------------------------------------------------------------------------------------------   Imaging Results:    Ct Angio Chest/abd/pel For Dissection W And/or Wo Contrast  Result Date: 04/05/2017 CLINICAL DATA:  Right lower quadrant pain and nausea beginning last night. Palpable abdominal mass. Previous abdominal aortic aneurysm repair and diverticulosis. EXAM: CT ANGIOGRAPHY CHEST, ABDOMEN AND PELVIS TECHNIQUE: Multidetector CT imaging through the chest, abdomen and pelvis was performed using the standard protocol during bolus administration of intravenous contrast. Multiplanar reconstructed images and MIPs were obtained and reviewed to evaluate the vascular anatomy. CONTRAST:  ISOVUE-370 IOPAMIDOL (ISOVUE-370) INJECTION 76% COMPARISON:  AP CTA on 02/07/2014; no prior chest CTA FINDINGS: CTA CHEST FINDINGS Cardiovascular: Moderate cardiomegaly, without pericardial  effusion. Aortic and coronary artery atherosclerosis. Aneurysm of the ascending aorta is seen measuring 4.3 cm. No evidence of aortic dissection. Mediastinum/Nodes: No masses or pathologically enlarged lymph nodes identified. Large hiatal hernia. Lungs/Pleura: No pulmonary mass, infiltrate, or effusion. Upper abdomen: No acute findings. Musculoskeletal: No suspicious bone lesions identified. Review of the MIP images confirms the above findings. CTA ABDOMEN AND PELVIS FINDINGS VASCULAR Aorta: Aorto bi-iliac stent graft remains in place. The native abdominal aortic aneurysm is measures 8.4 cm in maximum diameter compared to 6.7 cm in 2015. No evidence of endoleak. No evidence of retroperitoneal hemorrhage. Celiac: Patent without evidence of aneurysm, dissection, vasculitis or significant stenosis. SMA: Patent without evidence of aneurysm, dissection, vasculitis or significant stenosis. Renals: Both renal arteries are patent without evidence of aneurysm, dissection, vasculitis, fibromuscular dysplasia or significant stenosis. IMA: Patent without evidence of aneurysm, dissection, vasculitis or significant stenosis. Inflow: Patent without evidence of aneurysm, dissection, vasculitis or significant stenosis. Veins: No obvious venous abnormality within the limitations of this arterial phase study. Review of the MIP images confirms the above findings. NON-VASCULAR Hepatobiliary: Suboptimal visualization due to beam hardening artifact from contrast bolus in the heart and aorta, but no definite hepatic abnormality identified. Small calcified gallstone noted, without evidence of cholecystitis. Pancreas:  No mass or inflammatory changes. Spleen: Within normal limits in size and appearance. Adrenals/Urinary Tract: No masses identified. No evidence of hydronephrosis. Stomach/Bowel: No evidence of obstruction, inflammatory process or abnormal fluid collections. Large hiatal hernia. Diffuse gaseous distention of the colon noted,  without evidence of obstruction, highly suspicious for colonic ileus. Vascular/Lymphatic: No pathologically enlarged lymph nodes. No abdominal aortic aneurysm. Reproductive:  No mass or other significant abnormality. Other:  None. Musculoskeletal:  No suspicious bone lesions identified. Review of the MIP images confirms the above findings. IMPRESSION: 4.3 cm ascending thoracic aortic aneurysm. No evidence of aortic dissection. Previous stent graft repair of abdominal aortic aneurysm. Native aneurysm sac now measures 8.4 cm in maximum diameter, compared to 6.7 cm in 2015. No evidence of endoleak or retroperitoneal hemorrhage. Stable large hiatal  hernia. Probable colonic ileus. Cholelithiasis.  No radiographic evidence of cholecystitis. Electronically Signed   By: Myles Rosenthal M.D.   On: 04/05/2017 20:00      Assessment & Plan:    Principal Problem:   Nausea & vomiting Active Problems:   AAA (abdominal aortic aneurysm) (HCC)   Anemia of chronic disease   Tachycardia    Nausea and vomitting zofran iv protonix  ? Ileus Monitor clinically Hydrate with ns iv  AAA (8.4cm) Vascular surgery consulted by ED, appreciate input Per ED, will be by in AM  Tachycardia, Afib with RVR Tele Trop I q6h x3 Tsh chech cardiac echo Start metoprolol 12.5mg  po bid No anticoagulation due to AAA  Trop elevation Trop I q6h x3 Check cardiac echo Cardiology consult placed in computer  Start Metoprolol12.5mg  po bid Start Lipitor 80mg  po qhs Aspirin 81mg  po x1  Hypertension  Hypothyroidism Cont levothyroxine  Gerd/ PUD prilosec => protonix  Hyperglycemia Check hga1c    DVT Prophylaxis Lovenox - SCDs   AM Labs Ordered, also please review Full Orders  Family Communication: Admission, patients condition and plan of care including tests being ordered have been discussed with the patient  who indicate understanding and agree with the plan and Code Status.  Code Status DNR  Likely DC to   [facility]  Condition GUARDED    Consults called: vascular by ED, cardiology consult by email  Admission status: inpatient  Time spent in minutes : 45   Pearson Grippe M.D on 04/05/2017 at 9:05 PM  Between 7am to 7pm - Pager - [phone]    After 7pm go to [email or web address] - password [facility]  [facility] - Office  [phone]

## 2017-04-05 NOTE — ED Provider Notes (Signed)
[facility] Provider Note   CSN: [id number] Arrival date & time: 04/05/17  1801     History   Chief Complaint Chief Complaint  Patient presents with  . Abdominal Pain    HPI Ricardo [name] is a [age] y.o. male.  HPI   Ricardo [name] is a [age] y.o. male, with a history of 6.8 cm AAA, anemia, CHF, hyperlipidemia, HTN, presenting to the ED with abdominal pain for the last 8-10 days.  Complains of pain in the right lower quadrant as well as the bilateral lower back, gives a vague description, rates it 6/10, waxing and waning.  Patient vomited with EMS, but patient states he has not had vomiting until today.  EMS reports vomit was dark in color. Last BM 2 days ago, which patient states is normal timing for him. Chart review shows on call facility physician was contacted about patient's complaint.  Dr. Ernest Mallickeed's note details that since the patient has a history of diverticulitis, he will be started on a 7-day of Cipro and Flagyl.  CBC and CMP were ordered at the facility.  Patient was to be scheduled for CT of the abdomen and pelvis tomorrow.   Denies fever/chills, diarrhea, urinary complaints, hematochezia/melena, chest pain, shortness of breath, dizziness, or any other complaints.    Past Medical History:  Diagnosis Date  . AAA (abdominal aortic aneurysm) (HCC)   . Abnormality of gait 08/01/2010  . Adhesive capsulitis of shoulder 08/01/2010   "Frozen" right shoulder  . Anemia   . Anxiety state, unspecified 08/01/2010  . Arthritis   . Congestive heart failure, unspecified 08/01/2010  . Cough 08/07/2015  . Disorders of bursae and tendons in shoulder region, unspecified 08/01/2010  . Edema 08/01/2010  . Fall   . Fecal impaction (HCC)   . GERD (gastroesophageal reflux disease)   . Hiatal hernia   . Hypercalcemia   . Hyperlipidemia   . Hypertension   . Hypertrophy of prostate without urinary obstruction and other lower urinary tract symptoms  (LUTS) 08/01/2010  . Hyponatremia   . Hypothyroidism   . Infectious colitis   . Insomnia, unspecified 08/01/2010  . Joint pain   . Leg pain    with walking  . Lumbago 12/24/2010  . Orthostatic hypotension 11/05/2010  . Other specified cardiac dysrhythmias(427.89) 12/24/2010  . Pain in joint, pelvic region and thigh 08/06/2010  . Peptic ulcer, unspecified site, unspecified as acute or chronic, without mention of hemorrhage, perforation, or obstruction 08/01/2010  . Renal failure, acute (HCC)   . Swelling, mass, or lump in head and neck 01/03/2011  . Ulcer    Stomach  . Unspecified disorder of kidney and ureter 08/01/2010  . Unspecified hearing loss 08/01/2010  . Unspecified vitamin D deficiency 08/06/2010  . Urinary frequency 09/13/2010  . Vertigo     Patient Active Problem List   Diagnosis Date Noted  . Nausea & vomiting 04/05/2017  . Neuropathic pain 12/08/2016  . Anemia of chronic disease 12/08/2016  . DNR (do not resuscitate)   . Cough 08/07/2015  . Chronic mucus hypersecretion, respiratory 04/10/2015  . Xerostomia 03/27/2015  . Edema 03/27/2015  . Bradycardia by electrocardiogram 09/22/2014  . Spinal stenosis in cervical region 09/06/2013  . Deaf 06/21/2013  . Lumbago 06/21/2013  . Constipation 03/08/2013  . Hypothyroidism 03/08/2013  . BPH (benign prostatic hyperplasia) 03/08/2013  . Depression with anxiety 03/08/2013  . Fall at nursing home 03/08/2013  . AAA (abdominal aortic aneurysm) (HCC) 01/27/2012  .  Abnormality of gait 08/01/2010  . Osteoarthritis 05/17/2009  . GERD 03/16/2009  . ULCER-GASTRIC 03/16/2009  . DIVERTICULOSIS-COLON 03/16/2009  . Dyslipidemia 01/30/2009  . Iron deficiency anemia 01/30/2009  . Essential hypertension 01/30/2009  . VERTIGO 01/30/2009  . Chronic kidney disease, stage III (moderate) (HCC) 01/30/2009    Past Surgical History:  Procedure Laterality Date  . ABDOMINAL AORTIC ANEURYSM REPAIR  12-18-10   Stent graft repair of AAA  .  APPENDECTOMY    . CATARACT EXTRACTION    . HEMORRHOID SURGERY  2000  . INGUINAL HERNIA REPAIR Left 2005  . TRANSTHORACIC ECHOCARDIOGRAM  07/19/2010    Left ventricle: There is hypokinesis of the inferior wall and  posterior wall. The EF is 35-40%. The cavity size was normal       Home Medications    Prior to Admission medications   Medication Sig Start Date End Date Taking? Authorizing Provider  acetaminophen (TYLENOL) 325 MG tablet Take 650 mg by mouth every 4 (four) hours as needed.    [provider]  alum & mag hydroxide-simeth (MINTOX) 200-200-20 MG/5ML suspension Take 30 mLs by mouth every 4 (four) hours as needed for indigestion or heartburn.    [provider]  benzocaine (ORAJEL) 10 % mucosal gel Use as directed 1 application in the mouth or throat as needed for mouth pain.    [provider]  cholecalciferol (VITAMIN D) 1000 UNITS tablet Take 2,000 Units by mouth daily.     [provider]  finasteride (PROSCAR) 5 MG tablet Take 5 mg by mouth daily.  01/25/13   [provider]  fluocinonide (LIDEX) 0.05 % external solution Apply 1 application topically as needed.    [provider]  gabapentin (NEURONTIN) 100 MG capsule Take 100 mg by mouth at bedtime.    [provider]  guaiFENesin (ROBITUSSIN) 100 MG/5ML liquid Take 10 CC by mouth every 4 hours as needed for cough or excessive mucus    [provider]  ketoconazole (NIZORAL) 2 % shampoo Apply 1 application topically once a week.    [provider]  levothyroxine (SYNTHROID, LEVOTHROID) 50 MCG tablet Take 50 mcg by mouth daily before breakfast.  01/10/13   [provider]  Lidocaine (ASPERCREME LIDOCAINE) 4 % PTCH Apply 1 patch topically daily. Leave on for 12 hours    [provider]  losartan (COZAAR) 25 MG tablet Take 25 mg by mouth daily.  12/31/12   [provider]  NON FORMULARY Take 1 capsule by mouth as directed.  Beneflex--may self-administer and keep at bedside    [provider]  omeprazole (PRILOSEC) 20 MG capsule Take 20 mg by mouth daily.    [provider]  oxybutynin (DITROPAN-XL) 5 MG 24 hr tablet Take 5 mg by mouth daily.     [provider]  oxyCODONE (OXY IR/ROXICODONE) 5 MG immediate release tablet Take one tablet by mouth twice daily as needed for breakthrough pain 11/03/16   Ngetich, Dinah C, NP  oxyCODONE (OXYCONTIN) 10 mg 12 hr tablet Take one tablet by mouth every morning for pain. Do not crush or chew. 01/23/17   Ngetich, Dinah C, NP  polyethylene glycol (MIRALAX / GLYCOLAX) packet Take 17 g by mouth daily.    [provider]  polyvinyl alcohol (ARTIFICIAL TEARS) 1.4 % ophthalmic solution Place 1 drop into both eyes as needed.     [provider]  sennosides-docusate sodium (SENOKOT-S) 8.6-50 MG tablet Take 1 tablet by mouth daily as  needed for constipation.    [provider]  sennosides-docusate sodium (SENOKOT-S) 8.6-50 MG tablet Take 1 tablet by mouth every other day.    [provider]  triamcinolone (KENALOG) 0.1 % paste Use as directed 1 application in the mouth or throat as directed. 3-6 times daily    [provider]    Family History Family History  Problem Relation Age of Onset  . Heart failure Brother     Social History Social History   Tobacco Use  . Smoking status: Never Smoker  . Smokeless tobacco: Never Used  Substance Use Topics  . Alcohol use: Yes    Alcohol/week: 0.0 oz    Comment: 1 1/2 oz daily   . Drug use: No     Allergies   Aleve [naproxen] and Indocin [indomethacin]   Review of Systems Review of Systems  Constitutional: Negative for chills, diaphoresis and fever.  Respiratory: Negative for shortness of breath.   Cardiovascular: Negative for chest pain.  Gastrointestinal: Positive for abdominal pain, nausea and vomiting. Negative for diarrhea.  Genitourinary: Negative for  dysuria, hematuria, scrotal swelling and testicular pain.  Musculoskeletal: Positive for back pain.  All other systems reviewed and are negative.    Physical Exam Updated Vital Signs BP (!) 147/86 (BP Location: Left Arm)   Pulse (!) 54   Temp 97.6 F (36.4 C) (Oral)   Resp (!) 26   SpO2 95%   Physical Exam  Constitutional: He appears well-developed and well-nourished. No distress.  HENT:  Head: Normocephalic and atraumatic.  Eyes: Conjunctivae are normal.  Neck: Neck supple.  Cardiovascular: Normal rate, regular rhythm, normal heart sounds and intact distal pulses.  Pulmonary/Chest: Effort normal and breath sounds normal. No respiratory distress.  Abdominal: Bowel sounds are normal. He exhibits distension. There is tenderness in the right lower quadrant. There is no guarding.    Musculoskeletal: He exhibits no edema.  Lymphadenopathy:    He has no cervical adenopathy.  Neurological: He is alert.  Skin: Skin is warm and dry. He is not diaphoretic.  Psychiatric: He has a normal mood and affect. His behavior is normal.  Nursing note and vitals reviewed.    ED Treatments / Results  Labs (all labs ordered are listed, but only abnormal results are displayed) Labs Reviewed  COMPREHENSIVE METABOLIC PANEL - Abnormal; Notable for the following components:      Result Value   Glucose, Bld 163 (*)    BUN 24 (*)    ALT 11 (*)    GFR calc non Af Amer 46 (*)    GFR calc Af Amer 53 (*)    All other components within normal limits  CBC WITH DIFFERENTIAL/PLATELET - Abnormal; Notable for the following components:   RBC 4.13 (*)    Hemoglobin 12.0 (*)    HCT 36.6 (*)    All other components within normal limits  I-STAT CG4 LACTIC ACID, ED - Abnormal; Notable for the following components:   Lactic Acid, Venous 2.68 (*)    All other components within normal limits  I-STAT TROPONIN, ED - Abnormal; Notable for the following components:   Troponin i, poc 0.10 (*)    All other  components within normal limits  PROTIME-INR  APTT  URINALYSIS, ROUTINE W REFLEX MICROSCOPIC  I-STAT CG4 LACTIC ACID, ED    EKG  EKG Interpretation  Date/Time:  Sunday April 05 2017 18:27:10 EST Ventricular Rate:  89 PR Interval:    QRS Duration: 144 QT Interval:  405  QTC Calculation: 493 R Axis:   -49 Text Interpretation:  Atrial fibrillation Paired ventricular premature complexes LVH with IVCD, LAD and secondary repol abnrm Borderline prolonged QT interval When compred to prior, new Afib and new t wave inversion in lead AVL No STEMI Confirmed by Theda Belfast ([id number]) on 04/05/2017 6:34:02 PM       Radiology Ct Angio Chest/abd/pel For Dissection W And/or Wo Contrast  Result Date: 04/05/2017 CLINICAL DATA:  Right lower quadrant pain and nausea beginning last night. Palpable abdominal mass. Previous abdominal aortic aneurysm repair and diverticulosis. EXAM: CT ANGIOGRAPHY CHEST, ABDOMEN AND PELVIS TECHNIQUE: Multidetector CT imaging through the chest, abdomen and pelvis was performed using the standard protocol during bolus administration of intravenous contrast. Multiplanar reconstructed images and MIPs were obtained and reviewed to evaluate the vascular anatomy. CONTRAST:  ISOVUE-370 IOPAMIDOL (ISOVUE-370) INJECTION 76% COMPARISON:  AP CTA on 02/07/2014; no prior chest CTA FINDINGS: CTA CHEST FINDINGS Cardiovascular: Moderate cardiomegaly, without pericardial effusion. Aortic and coronary artery atherosclerosis. Aneurysm of the ascending aorta is seen measuring 4.3 cm. No evidence of aortic dissection. Mediastinum/Nodes: No masses or pathologically enlarged lymph nodes identified. Large hiatal hernia. Lungs/Pleura: No pulmonary mass, infiltrate, or effusion. Upper abdomen: No acute findings. Musculoskeletal: No suspicious bone lesions identified. Review of the MIP images confirms the above findings. CTA ABDOMEN AND PELVIS FINDINGS VASCULAR Aorta: Aorto bi-iliac stent graft remains  in place. The native abdominal aortic aneurysm is measures 8.4 cm in maximum diameter compared to 6.7 cm in 2015. No evidence of endoleak. No evidence of retroperitoneal hemorrhage. Celiac: Patent without evidence of aneurysm, dissection, vasculitis or significant stenosis. SMA: Patent without evidence of aneurysm, dissection, vasculitis or significant stenosis. Renals: Both renal arteries are patent without evidence of aneurysm, dissection, vasculitis, fibromuscular dysplasia or significant stenosis. IMA: Patent without evidence of aneurysm, dissection, vasculitis or significant stenosis. Inflow: Patent without evidence of aneurysm, dissection, vasculitis or significant stenosis. Veins: No obvious venous abnormality within the limitations of this arterial phase study. Review of the MIP images confirms the above findings. NON-VASCULAR Hepatobiliary: Suboptimal visualization due to beam hardening artifact from contrast bolus in the heart and aorta, but no definite hepatic abnormality identified. Small calcified gallstone noted, without evidence of cholecystitis. Pancreas:  No mass or inflammatory changes. Spleen: Within normal limits in size and appearance. Adrenals/Urinary Tract: No masses identified. No evidence of hydronephrosis. Stomach/Bowel: No evidence of obstruction, inflammatory process or abnormal fluid collections. Large hiatal hernia. Diffuse gaseous distention of the colon noted, without evidence of obstruction, highly suspicious for colonic ileus. Vascular/Lymphatic: No pathologically enlarged lymph nodes. No abdominal aortic aneurysm. Reproductive:  No mass or other significant abnormality. Other:  None. Musculoskeletal:  No suspicious bone lesions identified. Review of the MIP images confirms the above findings. IMPRESSION: 4.3 cm ascending thoracic aortic aneurysm. No evidence of aortic dissection. Previous stent graft repair of abdominal aortic aneurysm. Native aneurysm sac now measures 8.4 cm in  maximum diameter, compared to 6.7 cm in 2015. No evidence of endoleak or retroperitoneal hemorrhage. Stable large hiatal hernia. Probable colonic ileus. Cholelithiasis.  No radiographic evidence of cholecystitis. Electronically Signed   By: Myles Rosenthal M.D.   On: 04/05/2017 20:00    Procedures Procedures (including critical care time)  Medications Ordered in ED Medications  iopamidol (ISOVUE-370) 76 % injection (100 mLs Intravenous Contrast Given 04/05/17 1904)  labetalol (NORMODYNE,TRANDATE) injection 2.5 mg (2.5 mg Intravenous Given 04/05/17 2036)     Initial Impression / Assessment and Plan / ED Course  I  have reviewed the triage vital signs and the nursing notes.  Pertinent labs & imaging results that were available during my care of the patient were reviewed by me and considered in my medical decision making (see chart for details).  Clinical Course as of Apr 06 2047  Wynelle Link Apr 05, 2017  1843 Spoke with patient's son at the bedside. Confirms patient is a DNR and that it is his understanding that this is still the patient's wish. We will contact the facility to try to get a copy of DNR form.  [SJ]  2000 Improved from previous value. No chest pain, shortness of breath, or other similar complaints. No noted acute EKG changes. Troponin i, poc: (!!) 0.10 [SJ]  2021 Spoke with Dr. Darrick Penna, vascular surgeon.  States he will consult on the patient during his admission and then the patient can follow-up with him in the office.  Recommends keeping systolic blood pressure below 474, but does not need to be treated aggressively.  [SJ]  2035 Spoke with Dr. Selena Batten, hospitalist.  Agrees to admit the patient.  [SJ]    Clinical Course User Index [SJ] Arleny Kruger C, PA-C    Patient presents with abdominal pain and vomiting. Abdominal pain suspected to be from probable ileus noted on CT.  Admission for abdominal pain and inability to tolerate p.o. in the setting of ileus.   Findings and plan of care  discussed with Theda Belfast, MD. Dr. Rush Landmark personally evaluated and examined this patient.  Vitals:   04/05/17 1945 04/05/17 2000 04/05/17 2015 04/05/17 2030  BP: (!) 151/89 (!) 163/89 (!) 140/93 (!) 163/82  Pulse: 76 87 (!) 111 85  Resp: 15 (!) 23 18 17   Temp:      TempSrc:      SpO2: 94% 97% 93% 96%     CTA abdomen from Dec 2015: IMPRESSION: 1. Patent bifurcated stent graft with persistent type 2 endoleak and continued enlargement of the native aneurysm sac, now 6.8 cm diameter. 2. Large hiatal hernia.  Final Clinical Impressions(s) / ED Diagnoses   Final diagnoses:  Right lower quadrant abdominal pain  Ileus [facility])    ED Discharge Orders    None       Concepcion Living 04/05/17 2049    Tegeler, Canary Brim, MD 04/06/17 1153

## 2017-04-05 NOTE — ED Notes (Signed)
Patient transported to CT 

## 2017-04-06 ENCOUNTER — Other Ambulatory Visit: Payer: Self-pay

## 2017-04-06 ENCOUNTER — Observation Stay (HOSPITAL_BASED_OUTPATIENT_CLINIC_OR_DEPARTMENT_OTHER): Payer: Medicare PPO

## 2017-04-06 ENCOUNTER — Encounter (HOSPITAL_COMMUNITY): Payer: Self-pay | Admitting: General Practice

## 2017-04-06 DIAGNOSIS — R000 Tachycardia, unspecified: Secondary | ICD-10-CM | POA: Diagnosis not present

## 2017-04-06 DIAGNOSIS — R112 Nausea with vomiting, unspecified: Secondary | ICD-10-CM

## 2017-04-06 DIAGNOSIS — I351 Nonrheumatic aortic (valve) insufficiency: Secondary | ICD-10-CM | POA: Diagnosis not present

## 2017-04-06 DIAGNOSIS — K567 Ileus, unspecified: Principal | ICD-10-CM

## 2017-04-06 DIAGNOSIS — I714 Abdominal aortic aneurysm, without rupture: Secondary | ICD-10-CM | POA: Diagnosis not present

## 2017-04-06 DIAGNOSIS — E43 Unspecified severe protein-calorie malnutrition: Secondary | ICD-10-CM | POA: Diagnosis not present

## 2017-04-06 LAB — CBC
HCT: 31.7 % — ABNORMAL LOW (ref 39.0–52.0)
HEMOGLOBIN: 10.3 g/dL — AB (ref 13.0–17.0)
MCH: 28.8 pg (ref 26.0–34.0)
MCHC: 32.5 g/dL (ref 30.0–36.0)
MCV: 88.5 fL (ref 78.0–100.0)
PLATELETS: 138 10*3/uL — AB (ref 150–400)
RBC: 3.58 MIL/uL — AB (ref 4.22–5.81)
RDW: 13.5 % (ref 11.5–15.5)
WBC: 7.3 10*3/uL (ref 4.0–10.5)

## 2017-04-06 LAB — HEMOGLOBIN A1C
Hgb A1c MFr Bld: 5.6 % (ref 4.8–5.6)
MEAN PLASMA GLUCOSE: 114.02 mg/dL

## 2017-04-06 LAB — COMPREHENSIVE METABOLIC PANEL
ALK PHOS: 75 U/L (ref 38–126)
ALT: 11 U/L — ABNORMAL LOW (ref 17–63)
ANION GAP: 9 (ref 5–15)
AST: 24 U/L (ref 15–41)
Albumin: 3 g/dL — ABNORMAL LOW (ref 3.5–5.0)
BUN: 28 mg/dL — ABNORMAL HIGH (ref 6–20)
CALCIUM: 8.5 mg/dL — AB (ref 8.9–10.3)
CO2: 21 mmol/L — AB (ref 22–32)
CREATININE: 1.12 mg/dL (ref 0.61–1.24)
Chloride: 106 mmol/L (ref 101–111)
GFR, EST AFRICAN AMERICAN: 60 mL/min — AB (ref >60)
GFR, EST NON AFRICAN AMERICAN: 51 mL/min — AB (ref >60)
Glucose, Bld: 76 mg/dL (ref 65–99)
Potassium: 4.6 mmol/L (ref 3.5–5.1)
SODIUM: 136 mmol/L (ref 135–145)
Total Bilirubin: 0.8 mg/dL (ref 0.3–1.2)
Total Protein: 5.6 g/dL — ABNORMAL LOW (ref 6.5–8.1)

## 2017-04-06 LAB — LIPID PANEL
Cholesterol: 145 mg/dL (ref 0–200)
HDL: 47 mg/dL (ref >40)
LDL CALC: 89 mg/dL (ref 0–99)
Total CHOL/HDL Ratio: 3.1 RATIO
Triglycerides: 47 mg/dL (ref <150)
VLDL: 9 mg/dL (ref 0–40)

## 2017-04-06 LAB — ECHOCARDIOGRAM COMPLETE: WEIGHTICAEL: 2176.38 [oz_av]

## 2017-04-06 LAB — TROPONIN I
TROPONIN I: 0.1 ng/mL — AB (ref <0.03)
Troponin I: 0.14 ng/mL (ref <0.03)
Troponin I: 0.09 ng/mL (ref <0.03)

## 2017-04-06 LAB — TSH: TSH: 0.936 u[IU]/mL (ref 0.350–4.500)

## 2017-04-06 MED ORDER — POLYETHYLENE GLYCOL 3350 17 G PO PACK
17.0000 g | PACK | Freq: Once | ORAL | Status: AC
  Administered 2017-04-06: 17 g via ORAL
  Filled 2017-04-06: qty 1

## 2017-04-06 MED ORDER — LEVOTHYROXINE SODIUM 50 MCG PO TABS
50.0000 ug | ORAL_TABLET | Freq: Every day | ORAL | Status: DC
  Administered 2017-04-06 – 2017-04-08 (×3): 50 ug via ORAL
  Filled 2017-04-06 (×3): qty 1

## 2017-04-06 MED ORDER — OXYCODONE HCL 5 MG PO TABS: 5.0000 mg | ORAL_TABLET | Freq: Two times a day (BID) | ORAL | Status: DC | PRN

## 2017-04-06 MED ORDER — POLYVINYL ALCOHOL 1.4 % OP SOLN: 1.0000 [drp] | OPHTHALMIC | Status: DC | PRN

## 2017-04-06 MED ORDER — SENNOSIDES-DOCUSATE SODIUM 8.6-50 MG PO TABS
1.0000 | ORAL_TABLET | ORAL | Status: DC
  Administered 2017-04-06 – 2017-04-08 (×2): 1 via ORAL
  Filled 2017-04-06 (×2): qty 1

## 2017-04-06 MED ORDER — VITAMIN D 1000 UNITS PO TABS
2000.0000 [IU] | ORAL_TABLET | Freq: Every day | ORAL | Status: DC
  Administered 2017-04-06 – 2017-04-08 (×3): 2000 [IU] via ORAL
  Filled 2017-04-06 (×3): qty 2

## 2017-04-06 MED ORDER — POLYETHYLENE GLYCOL 3350 17 G PO PACK
17.0000 g | PACK | Freq: Every day | ORAL | Status: DC
  Administered 2017-04-06 – 2017-04-08 (×3): 17 g via ORAL
  Filled 2017-04-06 (×3): qty 1

## 2017-04-06 MED ORDER — ALUM & MAG HYDROXIDE-SIMETH 200-200-20 MG/5ML PO SUSP: 30.0000 mL | ORAL | Status: DC | PRN

## 2017-04-06 MED ORDER — SODIUM CHLORIDE 0.9 % IV SOLN
INTRAVENOUS | Status: AC
  Administered 2017-04-06: 09:00:00 via INTRAVENOUS

## 2017-04-06 MED ORDER — OXYCODONE HCL ER 10 MG PO T12A
10.0000 mg | EXTENDED_RELEASE_TABLET | Freq: Two times a day (BID) | ORAL | Status: DC
  Administered 2017-04-06 – 2017-04-08 (×5): 10 mg via ORAL
  Filled 2017-04-06 (×5): qty 1

## 2017-04-06 MED ORDER — ACETAMINOPHEN 650 MG RE SUPP: 650.0000 mg | Freq: Four times a day (QID) | RECTAL | Status: DC | PRN

## 2017-04-06 MED ORDER — BENZOCAINE 10 % MT GEL: 1.0000 "application " | OROMUCOSAL | Status: DC | PRN

## 2017-04-06 MED ORDER — OXYBUTYNIN CHLORIDE ER 5 MG PO TB24
5.0000 mg | ORAL_TABLET | Freq: Every day | ORAL | Status: DC
  Administered 2017-04-06 – 2017-04-08 (×3): 5 mg via ORAL
  Filled 2017-04-06 (×3): qty 1

## 2017-04-06 MED ORDER — LOSARTAN POTASSIUM 50 MG PO TABS
25.0000 mg | ORAL_TABLET | Freq: Every day | ORAL | Status: DC
  Administered 2017-04-06 – 2017-04-08 (×3): 25 mg via ORAL
  Filled 2017-04-06 (×3): qty 1

## 2017-04-06 MED ORDER — ENOXAPARIN SODIUM 30 MG/0.3ML ~~LOC~~ SOLN
30.0000 mg | SUBCUTANEOUS | Status: DC
  Administered 2017-04-06 – 2017-04-07 (×2): 30 mg via SUBCUTANEOUS
  Filled 2017-04-06 (×2): qty 0.3

## 2017-04-06 MED ORDER — GABAPENTIN 100 MG PO CAPS
100.0000 mg | ORAL_CAPSULE | Freq: Every day | ORAL | Status: DC
  Administered 2017-04-06 – 2017-04-07 (×2): 100 mg via ORAL
  Filled 2017-04-06 (×2): qty 1

## 2017-04-06 MED ORDER — FINASTERIDE 5 MG PO TABS
5.0000 mg | ORAL_TABLET | Freq: Every day | ORAL | Status: DC
  Administered 2017-04-06 – 2017-04-08 (×3): 5 mg via ORAL
  Filled 2017-04-06 (×3): qty 1

## 2017-04-06 MED ORDER — KETOCONAZOLE 2 % EX SHAM: 1.0000 "application " | MEDICATED_SHAMPOO | CUTANEOUS | Status: DC

## 2017-04-06 MED ORDER — ATORVASTATIN CALCIUM 80 MG PO TABS
80.0000 mg | ORAL_TABLET | Freq: Every day | ORAL | Status: DC
  Administered 2017-04-06 – 2017-04-07 (×2): 80 mg via ORAL
  Filled 2017-04-06 (×2): qty 1

## 2017-04-06 MED ORDER — BOOST / RESOURCE BREEZE PO LIQD CUSTOM
1.0000 | Freq: Three times a day (TID) | ORAL | Status: DC
  Administered 2017-04-06 – 2017-04-08 (×6): 1 via ORAL

## 2017-04-06 MED ORDER — ONDANSETRON HCL 4 MG/2ML IJ SOLN: 4.0000 mg | Freq: Four times a day (QID) | INTRAMUSCULAR | Status: DC | PRN

## 2017-04-06 MED ORDER — SENNOSIDES-DOCUSATE SODIUM 8.6-50 MG PO TABS: 1.0000 | ORAL_TABLET | Freq: Every day | ORAL | Status: DC | PRN

## 2017-04-06 MED ORDER — ACETAMINOPHEN 325 MG PO TABS: 650.0000 mg | ORAL_TABLET | Freq: Four times a day (QID) | ORAL | Status: DC | PRN

## 2017-04-06 MED ORDER — PANTOPRAZOLE SODIUM 40 MG PO TBEC
40.0000 mg | DELAYED_RELEASE_TABLET | Freq: Every day | ORAL | Status: DC
  Administered 2017-04-06 – 2017-04-08 (×3): 40 mg via ORAL
  Filled 2017-04-06 (×3): qty 1

## 2017-04-06 NOTE — Progress Notes (Signed)
CRITICAL VALUE ALERT  Critical Value:  Troponin 0.09  Date & Time Notied:  12/05/17 at 2201  Provider Notified: Bodenheimer  Orders Received/Actions taken: No new orders at this time, pt asymptomatic.

## 2017-04-06 NOTE — Progress Notes (Signed)
CRITICAL VALUE ALERT  Critical Value:  Troponin 0.14  Date & Time Notied: 04/06/17 at 1009.  Provider Notified: Dr. Benjamine MolaVann at 1010.  Orders Received/Actions taken: None.

## 2017-04-06 NOTE — Consult Note (Signed)
Pt has had prior aneurysm stent graft repair by Dr Hart RochesterLawson in 2012.  This graft has slipped down into the aorta and the aneurysm is not excluded.  This has been known since at least 2015.  At that time he was deemed to not be a candidate for further intervention due to his age and morbidity associated with aneurysm.    I would agree that pt is not a candidate for repair after review of his CT from last evening.    No further follow up with vascular surgery necessary.  If aneurysm ruptures would proceed with palliative care for pain control.  Fabienne Brunsharles Ariana Cavenaugh, MD [facility] Office: [phone] Pager: [phone]

## 2017-04-06 NOTE — Progress Notes (Signed)
  Echocardiogram 2D Echocardiogram has been performed.  Iasiah Ozment L Androw 04/06/2017, 12:18 PM

## 2017-04-06 NOTE — Progress Notes (Signed)
PROGRESS NOTE    Ricardo [name]  ZOX:[id number] DOB: 03-Mar-1914 DOA: 04/05/2017 PCP: Oneal Grout, MD   Outpatient Specialists:     Brief Narrative:  Ricardo [name]  is a 84 y.o. male, w hypothyroidism, hypertension, hyperlipidemia, PUD, CHF, AAA, presents with c/o intractable nausea and vomitting today as well as some right lower abdominal discomfort and constipation.  Pt denies fever, chills, cough, cp, palp, heartburn, diarrhea, brbpr, black stool .  Pt presented to ED for evaluation.   In Ed,  CTA chest / abd / pelvis IMPRESSION: 4.3 cm ascending thoracic aortic aneurysm. No evidence of aortic dissection. Previous stent graft repair of abdominal aortic aneurysm. Native aneurysm sac now measures 8.4 cm in maximum diameter, compared to 6.7 cm in 2015. No evidence of endoleak or retroperitoneal hemorrhage. Stable large hiatal hernia. Probable colonic ileus. Cholelithiasis. No radiographic evidence of cholecystitis.  Wbc 7.5, Hgb 12.0, Plt 160 Na 137, K 4.6, Bun 24, Creatinine 1.23 Ast 29, Alt 11 Glucose 163 INR 1.11 PTT 27  Trop 0.10    EKG Afib at 90 nl axis LAD, LBBB, slight st depression V6  Pt will be admitted for nausea and vomitting and worsening AAA, and slight troponin elevation.      Assessment & Plan:   Principal Problem:   Nausea & vomiting Active Problems:   AAA (abdominal aortic aneurysm) (HCC)   Anemia of chronic disease   Tachycardia   Nausea and vomiting due to ileus zofran iv protonix Clears, plan to advance as tolerated -bowel regimen as on chronic opioids  AAA (8.4cm) Vascular surgery consulted by ED:  Pt has had prior aneurysm stent graft repair by Dr Hart Rochester in 2012.  This graft has slipped down into the aorta and the aneurysm is not excluded.  This has been known since at least 2015.  At that time he was deemed to not be a candidate for further intervention due to his age and morbidity associated with aneurysm.  I would  agree that pt is not a candidate for repair after review of his CT from last evening.  No further follow up with vascular surgery necessary.  If aneurysm ruptures would proceed with palliative care for pain control.  Tachycardia- sinus (not convinced a fib) chech cardiac echo Start metoprolol 12.5mg  po bid No anticoagulation due to AAA  Trop elevation -trending down, suspect due to demand from N/V  Hypertension -BB added upon admission for tachycardia  Hypothyroidism Cont levothyroxine  Gerd/ PUD prilosec => protonix  Hyperglycemia-- ? From reaction to stress Hga1c: 5.6      DVT prophylaxis:  SCD's  Code Status: DNR per H&P   Family Communication: LM for son  Disposition Plan:  PT eval -- from [facility]   Consultants:        Subjective: Asking to eat  Objective: Vitals:   04/05/17 2100 04/05/17 2150 04/06/17 0459 04/06/17 0857  BP: 138/87 131/84 132/60 (!) 146/64  Pulse: (!) 103 (!) 107 (!) 56 (!) 54  Resp: 16 18 20    Temp:  (!) 97.4 F (36.3 C) 98.2 F (36.8 C)   TempSrc:  Axillary Oral   SpO2: 95% 95% 95%   Weight:  61.7 kg (136 lb 0.4 oz)      Intake/Output Summary (Last 24 hours) at 04/06/2017 1058 Last data filed at 04/05/2017 1811 Gross per 24 hour  Intake 200 ml  Output -  Net 200 ml   Filed Weights   04/05/17 2150  Weight: 61.7 kg (136  lb 0.4 oz)    Examination:  General exam: pale-- very hard of hearing Respiratory system: Clear to auscultation. Respiratory effort normal. Cardiovascular system: S1 & S2 heard, RRR. No JVD, murmurs, rubs, gallops or clicks. No pedal edema. Gastrointestinal system: NT, +BS Central nervous system: Alert      Data Reviewed: I have personally reviewed following labs and imaging studies  CBC: Recent Labs  Lab 04/05/17 1846 04/06/17 0821  WBC 7.5 7.3  NEUTROABS 6.3  --   HGB 12.0* 10.3*  HCT 36.6* 31.7*  MCV 88.6 88.5  PLT 160 138*   Basic Metabolic Panel: Recent Labs    Lab 04/05/17 1846 04/06/17 0821  NA 137 136  K 4.6 4.6  CL 101 106  CO2 24 21*  GLUCOSE 163* 76  BUN 24* 28*  CREATININE 1.23 1.12  CALCIUM 9.3 8.5*   GFR: Estimated Creatinine Clearance: 29.8 mL/min (by C-G formula based on SCr of 1.12 mg/dL). Liver Function Tests: Recent Labs  Lab 04/05/17 1846 04/06/17 0821  AST 29 24  ALT 11* 11*  ALKPHOS 104 75  BILITOT 0.9 0.8  PROT 7.1 5.6*  ALBUMIN 3.8 3.0*   No results for input(s): LIPASE, AMYLASE in the last 168 hours. No results for input(s): AMMONIA in the last 168 hours. Coagulation Profile: Recent Labs  Lab 04/05/17 1846  INR 1.11   Cardiac Enzymes: Recent Labs  Lab 04/06/17 0821  TROPONINI 0.14*   BNP (last 3 results) No results for input(s): PROBNP in the last 8760 hours. HbA1C: Recent Labs    04/06/17 0821  HGBA1C 5.6   CBG: No results for input(s): GLUCAP in the last 168 hours. Lipid Profile: Recent Labs    04/06/17 0821  CHOL 145  HDL 47  LDLCALC 89  TRIG 47  CHOLHDL 3.1   Thyroid Function Tests: Recent Labs    04/06/17 0821  TSH 0.936   Anemia Panel: No results for input(s): VITAMINB12, FOLATE, FERRITIN, TIBC, IRON, RETICCTPCT in the last 72 hours. Urine analysis:    Component Value Date/Time   COLORURINE YELLOW 01/06/2016 2012   APPEARANCEUR CLEAR 01/06/2016 2012   LABSPEC 1.016 01/06/2016 2012   PHURINE 6.0 01/06/2016 2012   GLUCOSEU NEGATIVE 01/06/2016 2012   HGBUR MODERATE (A) 01/06/2016 2012   BILIRUBINUR NEGATIVE 01/06/2016 2012   KETONESUR NEGATIVE 01/06/2016 2012   PROTEINUR 100 (A) 01/06/2016 2012   UROBILINOGEN 1.0 12/13/2010 1129   NITRITE NEGATIVE 01/06/2016 2012   LEUKOCYTESUR NEGATIVE 01/06/2016 2012      Recent Results (from the past 240 hour(s))  MRSA PCR Screening     Status: None   Collection Time: 04/05/17 10:34 PM  Result Value Ref Range Status   MRSA by PCR NEGATIVE NEGATIVE Final    Comment:        The GeneXpert MRSA Assay (FDA approved for NASAL  specimens only), is one component of a comprehensive MRSA colonization surveillance program. It is not intended to diagnose MRSA infection nor to guide or monitor treatment for MRSA infections.       Anti-infectives (From admission, onward)   None       Radiology Studies: Ct Angio Chest/abd/pel For Dissection W And/or Wo Contrast  Result Date: 04/05/2017 CLINICAL DATA:  Right lower quadrant pain and nausea beginning last night. Palpable abdominal mass. Previous abdominal aortic aneurysm repair and diverticulosis. EXAM: CT ANGIOGRAPHY CHEST, ABDOMEN AND PELVIS TECHNIQUE: Multidetector CT imaging through the chest, abdomen and pelvis was performed using the standard protocol during bolus administration  of intravenous contrast. Multiplanar reconstructed images and MIPs were obtained and reviewed to evaluate the vascular anatomy. CONTRAST:  100mL ISOVUE-370 IOPAMIDOL (ISOVUE-370) INJECTION 76% COMPARISON:  AP CTA on 02/07/2014; no prior chest CTA FINDINGS: CTA CHEST FINDINGS Cardiovascular: Moderate cardiomegaly, without pericardial effusion. Aortic and coronary artery atherosclerosis. Aneurysm of the ascending aorta is seen measuring 4.3 cm. No evidence of aortic dissection. Mediastinum/Nodes: No masses or pathologically enlarged lymph nodes identified. Large hiatal hernia. Lungs/Pleura: No pulmonary mass, infiltrate, or effusion. Upper abdomen: No acute findings. Musculoskeletal: No suspicious bone lesions identified. Review of the MIP images confirms the above findings. CTA ABDOMEN AND PELVIS FINDINGS VASCULAR Aorta: Aorto bi-iliac stent graft remains in place. The native abdominal aortic aneurysm is measures 8.4 cm in maximum diameter compared to 6.7 cm in 2015. No evidence of endoleak. No evidence of retroperitoneal hemorrhage. Celiac: Patent without evidence of aneurysm, dissection, vasculitis or significant stenosis. SMA: Patent without evidence of aneurysm, dissection, vasculitis or  significant stenosis. Renals: Both renal arteries are patent without evidence of aneurysm, dissection, vasculitis, fibromuscular dysplasia or significant stenosis. IMA: Patent without evidence of aneurysm, dissection, vasculitis or significant stenosis. Inflow: Patent without evidence of aneurysm, dissection, vasculitis or significant stenosis. Veins: No obvious venous abnormality within the limitations of this arterial phase study. Review of the MIP images confirms the above findings. NON-VASCULAR Hepatobiliary: Suboptimal visualization due to beam hardening artifact from contrast bolus in the heart and aorta, but no definite hepatic abnormality identified. Small calcified gallstone noted, without evidence of cholecystitis. Pancreas:  No mass or inflammatory changes. Spleen: Within normal limits in size and appearance. Adrenals/Urinary Tract: No masses identified. No evidence of hydronephrosis. Stomach/Bowel: No evidence of obstruction, inflammatory process or abnormal fluid collections. Large hiatal hernia. Diffuse gaseous distention of the colon noted, without evidence of obstruction, highly suspicious for colonic ileus. Vascular/Lymphatic: No pathologically enlarged lymph nodes. No abdominal aortic aneurysm. Reproductive:  No mass or other significant abnormality. Other:  None. Musculoskeletal:  No suspicious bone lesions identified. Review of the MIP images confirms the above findings. IMPRESSION: 4.3 cm ascending thoracic aortic aneurysm. No evidence of aortic dissection. Previous stent graft repair of abdominal aortic aneurysm. Native aneurysm sac now measures 8.4 cm in maximum diameter, compared to 6.7 cm in 2015. No evidence of endoleak or retroperitoneal hemorrhage. Stable large hiatal hernia. Probable colonic ileus. Cholelithiasis.  No radiographic evidence of cholecystitis. Electronically Signed   By: Myles RosenthalJohn  Stahl M.D.   On: 04/05/2017 20:00        Scheduled Meds: . atorvastatin  80 mg Oral q1800    . cholecalciferol  2,000 Units Oral Daily  . enoxaparin (LOVENOX) injection  30 mg Subcutaneous Q24H  . finasteride  5 mg Oral Daily  . gabapentin  100 mg Oral QHS  . levothyroxine  50 mcg Oral QAC breakfast  . losartan  25 mg Oral Daily  . metoprolol tartrate  12.5 mg Oral BID  . oxybutynin  5 mg Oral Daily  . oxyCODONE  10 mg Oral Q12H  . pantoprazole  40 mg Oral Daily  . polyethylene glycol  17 g Oral Daily  . senna-docusate  1 tablet Oral QODAY   Continuous Infusions: . sodium chloride 75 mL/hr at 04/06/17 0902     LOS: 0 days    Time spent: 25 min    Joseph ArtJessica U Dominick Morella, DO [facility] Pager [phone]  If 7PM-7AM, please contact night-coverage [email or web address] Password [email or web address] 04/06/2017, 10:58 AM

## 2017-04-07 DIAGNOSIS — K8020 Calculus of gallbladder without cholecystitis without obstruction: Secondary | ICD-10-CM | POA: Diagnosis present

## 2017-04-07 DIAGNOSIS — H9190 Unspecified hearing loss, unspecified ear: Secondary | ICD-10-CM | POA: Diagnosis present

## 2017-04-07 DIAGNOSIS — Z8679 Personal history of other diseases of the circulatory system: Secondary | ICD-10-CM | POA: Diagnosis not present

## 2017-04-07 DIAGNOSIS — I495 Sick sinus syndrome: Secondary | ICD-10-CM | POA: Diagnosis present

## 2017-04-07 DIAGNOSIS — K567 Ileus, unspecified: Secondary | ICD-10-CM | POA: Diagnosis not present

## 2017-04-07 DIAGNOSIS — E43 Unspecified severe protein-calorie malnutrition: Secondary | ICD-10-CM | POA: Diagnosis not present

## 2017-04-07 DIAGNOSIS — F411 Generalized anxiety disorder: Secondary | ICD-10-CM | POA: Diagnosis present

## 2017-04-07 DIAGNOSIS — Z888 Allergy status to other drugs, medicaments and biological substances status: Secondary | ICD-10-CM | POA: Diagnosis not present

## 2017-04-07 DIAGNOSIS — K279 Peptic ulcer, site unspecified, unspecified as acute or chronic, without hemorrhage or perforation: Secondary | ICD-10-CM | POA: Diagnosis present

## 2017-04-07 DIAGNOSIS — K219 Gastro-esophageal reflux disease without esophagitis: Secondary | ICD-10-CM | POA: Diagnosis present

## 2017-04-07 DIAGNOSIS — R112 Nausea with vomiting, unspecified: Secondary | ICD-10-CM | POA: Diagnosis not present

## 2017-04-07 DIAGNOSIS — N183 Chronic kidney disease, stage 3 (moderate): Secondary | ICD-10-CM | POA: Diagnosis present

## 2017-04-07 DIAGNOSIS — I350 Nonrheumatic aortic (valve) stenosis: Secondary | ICD-10-CM | POA: Diagnosis present

## 2017-04-07 DIAGNOSIS — R109 Unspecified abdominal pain: Secondary | ICD-10-CM | POA: Diagnosis present

## 2017-04-07 DIAGNOSIS — Z8249 Family history of ischemic heart disease and other diseases of the circulatory system: Secondary | ICD-10-CM | POA: Diagnosis not present

## 2017-04-07 DIAGNOSIS — N400 Enlarged prostate without lower urinary tract symptoms: Secondary | ICD-10-CM | POA: Diagnosis present

## 2017-04-07 DIAGNOSIS — I712 Thoracic aortic aneurysm, without rupture: Secondary | ICD-10-CM | POA: Diagnosis present

## 2017-04-07 DIAGNOSIS — E039 Hypothyroidism, unspecified: Secondary | ICD-10-CM | POA: Diagnosis present

## 2017-04-07 DIAGNOSIS — K449 Diaphragmatic hernia without obstruction or gangrene: Secondary | ICD-10-CM | POA: Diagnosis present

## 2017-04-07 DIAGNOSIS — I714 Abdominal aortic aneurysm, without rupture: Secondary | ICD-10-CM | POA: Diagnosis not present

## 2017-04-07 DIAGNOSIS — Z66 Do not resuscitate: Secondary | ICD-10-CM | POA: Diagnosis present

## 2017-04-07 DIAGNOSIS — Z79891 Long term (current) use of opiate analgesic: Secondary | ICD-10-CM | POA: Diagnosis not present

## 2017-04-07 DIAGNOSIS — I248 Other forms of acute ischemic heart disease: Secondary | ICD-10-CM | POA: Diagnosis present

## 2017-04-07 DIAGNOSIS — D638 Anemia in other chronic diseases classified elsewhere: Secondary | ICD-10-CM | POA: Diagnosis present

## 2017-04-07 DIAGNOSIS — I447 Left bundle-branch block, unspecified: Secondary | ICD-10-CM | POA: Diagnosis present

## 2017-04-07 DIAGNOSIS — Z9849 Cataract extraction status, unspecified eye: Secondary | ICD-10-CM | POA: Diagnosis not present

## 2017-04-07 DIAGNOSIS — E785 Hyperlipidemia, unspecified: Secondary | ICD-10-CM | POA: Diagnosis present

## 2017-04-07 DIAGNOSIS — R000 Tachycardia, unspecified: Secondary | ICD-10-CM | POA: Diagnosis not present

## 2017-04-07 LAB — CBC
HCT: 34.1 % — ABNORMAL LOW (ref 39.0–52.0)
HEMOGLOBIN: 10.9 g/dL — AB (ref 13.0–17.0)
MCH: 28.5 pg (ref 26.0–34.0)
MCHC: 32 g/dL (ref 30.0–36.0)
MCV: 89.3 fL (ref 78.0–100.0)
PLATELETS: 153 10*3/uL (ref 150–400)
RBC: 3.82 MIL/uL — ABNORMAL LOW (ref 4.22–5.81)
RDW: 13.8 % (ref 11.5–15.5)
WBC: 8 10*3/uL (ref 4.0–10.5)

## 2017-04-07 LAB — BASIC METABOLIC PANEL
ANION GAP: 10 (ref 5–15)
BUN: 26 mg/dL — ABNORMAL HIGH (ref 6–20)
CHLORIDE: 104 mmol/L (ref 101–111)
CO2: 20 mmol/L — ABNORMAL LOW (ref 22–32)
Calcium: 8.6 mg/dL — ABNORMAL LOW (ref 8.9–10.3)
Creatinine, Ser: 1.15 mg/dL (ref 0.61–1.24)
GFR, EST AFRICAN AMERICAN: 58 mL/min — AB (ref >60)
GFR, EST NON AFRICAN AMERICAN: 50 mL/min — AB (ref >60)
Glucose, Bld: 104 mg/dL — ABNORMAL HIGH (ref 65–99)
POTASSIUM: 4.5 mmol/L (ref 3.5–5.1)
SODIUM: 134 mmol/L — AB (ref 135–145)

## 2017-04-07 NOTE — Progress Notes (Addendum)
PROGRESS NOTE    Ricardo [name]  ZOX:[id number] DOB: May 21, 1913 DOA: 04/05/2017 PCP: Oneal Grout, MD   Outpatient Specialists:     Brief Narrative:  Ricardo [name]  is a 84 y.o. male, w hypothyroidism, hypertension, hyperlipidemia, PUD, CHF, AAA, presents with c/o intractable nausea and vomitting today as well as some right lower abdominal discomfort and constipation.  Pt denies fever, chills, cough, cp, palp, heartburn, diarrhea, brbpr, black stool .  Pt presented to ED for evaluation.   In Ed,  CTA chest / abd / pelvis IMPRESSION: 4.3 cm ascending thoracic aortic aneurysm. No evidence of aortic dissection. Previous stent graft repair of abdominal aortic aneurysm. Native aneurysm sac now measures 8.4 cm in maximum diameter, compared to 6.7 cm in 2015. No evidence of endoleak or retroperitoneal hemorrhage. Stable large hiatal hernia. Probable colonic ileus. Cholelithiasis. No radiographic evidence of cholecystitis.  Wbc 7.5, Hgb 12.0, Plt 160 Na 137, K 4.6, Bun 24, Creatinine 1.23 Ast 29, Alt 11 Glucose 163 INR 1.11 PTT 27  Trop 0.10    EKG Afib at 90 nl axis LAD, LBBB, slight st depression V6  Pt will be admitted for nausea and vomitting and worsening AAA, and slight troponin elevation.      Assessment & Plan:   Principal Problem:   Nausea & vomiting Active Problems:   AAA (abdominal aortic aneurysm) (HCC)   Anemia of chronic disease   Tachycardia   Nausea and vomiting due to ileus zofran iv protonix Large bowel movement this AM -bowel regimen as is on chronic opioids Tolerated clears and now advanced to full liquids, plan for soft diet in PM or in AM and d/c tomm AM  AAA (8.4cm) Vascular surgery consulted by ED:  Pt has had prior aneurysm stent graft repair by Dr Hart Rochester in 2012.  This graft has slipped down into the aorta and the aneurysm is not excluded.  This has been known since at least 2015.  At that time he was deemed to not be a  candidate for further intervention due to his age and morbidity associated with aneurysm.  I would agree that pt is not a candidate for repair after review of his CT from last evening.  No further follow up with vascular surgery necessary.  If aneurysm ruptures would proceed with palliative care for pain control.  Tachy/brady syndrome- sinus (not convinced a fib) cardiac echo: Compared to the previous study, aortic stenosis is slghtly worse, but LV function has improved. Discussed with cardiology and no intervention due to age/co-morbidities-- updated family  Trop elevation -trending down, suspect due to demand from N/V  Hypertension -resume home meds  Hypothyroidism Cont levothyroxine  Gerd/ PUD prilosec => protonix  Hyperglycemia-- ? From reaction to stress Hga1c: 5.6      DVT prophylaxis:  SCD's  Code Status: DNR    Family Communication: Son and daughter in law at bedside  Disposition Plan:  Back to [facility] in AM   Consultants:        Subjective: Large BM this AM- walked 200 ft with PT  Objective: Vitals:   04/06/17 2113 04/07/17 0613 04/07/17 0700 04/07/17 0941  BP: 135/61 137/87 137/87 (!) 149/65  Pulse: (!) 55 (!) 50 (!) 50 60  Resp: 16 17 17    Temp: 98 F (36.7 C) 97.6 F (36.4 C) 97.6 F (36.4 C)   TempSrc: Oral Oral Oral   SpO2: 95% 95% 95%   Weight:        Intake/Output Summary (Last 24  hours) at 04/07/2017 1211 Last data filed at 04/06/2017 1800 Gross per 24 hour  Intake 240 ml  Output -  Net 240 ml   Filed Weights   04/05/17 2150  Weight: 61.7 kg (136 lb 0.4 oz)    Examination:  General exam: NAD, in chair, visiting with family Respiratory system: no wheezing Cardiovascular system: rrr Gastrointestinal system: +BS, soft Central nervous system: NAD      Data Reviewed: I have personally reviewed following labs and imaging studies  CBC: Recent Labs  Lab 04/05/17 1846 04/06/17 0821 04/07/17 0854  WBC 7.5 7.3 8.0    NEUTROABS 6.3  --   --   HGB 12.0* 10.3* 10.9*  HCT 36.6* 31.7* 34.1*  MCV 88.6 88.5 89.3  PLT 160 138* 153   Basic Metabolic Panel: Recent Labs  Lab 04/05/17 1846 04/06/17 0821 04/07/17 0854  NA 137 136 134*  K 4.6 4.6 4.5  CL 101 106 104  CO2 24 21* 20*  GLUCOSE 163* 76 104*  BUN 24* 28* 26*  CREATININE 1.23 1.12 1.15  CALCIUM 9.3 8.5* 8.6*   GFR: Estimated Creatinine Clearance: 29.1 mL/min (by C-G formula based on SCr of 1.15 mg/dL). Liver Function Tests: Recent Labs  Lab 04/05/17 1846 04/06/17 0821  AST 29 24  ALT 11* 11*  ALKPHOS 104 75  BILITOT 0.9 0.8  PROT 7.1 5.6*  ALBUMIN 3.8 3.0*   No results for input(s): LIPASE, AMYLASE in the last 168 hours. No results for input(s): AMMONIA in the last 168 hours. Coagulation Profile: Recent Labs  Lab 04/05/17 1846  INR 1.11   Cardiac Enzymes: Recent Labs  Lab 04/06/17 0821 04/06/17 1430 04/06/17 2017  TROPONINI 0.14* 0.10* 0.09*   BNP (last 3 results) No results for input(s): PROBNP in the last 8760 hours. HbA1C: Recent Labs    04/06/17 0821  HGBA1C 5.6   CBG: No results for input(s): GLUCAP in the last 168 hours. Lipid Profile: Recent Labs    04/06/17 0821  CHOL 145  HDL 47  LDLCALC 89  TRIG 47  CHOLHDL 3.1   Thyroid Function Tests: Recent Labs    04/06/17 0821  TSH 0.936   Anemia Panel: No results for input(s): VITAMINB12, FOLATE, FERRITIN, TIBC, IRON, RETICCTPCT in the last 72 hours. Urine analysis:    Component Value Date/Time   COLORURINE YELLOW 01/06/2016 2012   APPEARANCEUR CLEAR 01/06/2016 2012   LABSPEC 1.016 01/06/2016 2012   PHURINE 6.0 01/06/2016 2012   GLUCOSEU NEGATIVE 01/06/2016 2012   HGBUR MODERATE (A) 01/06/2016 2012   BILIRUBINUR NEGATIVE 01/06/2016 2012   KETONESUR NEGATIVE 01/06/2016 2012   PROTEINUR 100 (A) 01/06/2016 2012   UROBILINOGEN 1.0 12/13/2010 1129   NITRITE NEGATIVE 01/06/2016 2012   LEUKOCYTESUR NEGATIVE 01/06/2016 2012      Recent  Results (from the past 240 hour(s))  MRSA PCR Screening     Status: None   Collection Time: 04/05/17 10:34 PM  Result Value Ref Range Status   MRSA by PCR NEGATIVE NEGATIVE Final    Comment:        The GeneXpert MRSA Assay (FDA approved for NASAL specimens only), is one component of a comprehensive MRSA colonization surveillance program. It is not intended to diagnose MRSA infection nor to guide or monitor treatment for MRSA infections.       Anti-infectives (From admission, onward)   None       Radiology Studies: Ct Angio Chest/abd/pel For Dissection W And/or Wo Contrast  Result Date: 04/05/2017 CLINICAL  DATA:  Right lower quadrant pain and nausea beginning last night. Palpable abdominal mass. Previous abdominal aortic aneurysm repair and diverticulosis. EXAM: CT ANGIOGRAPHY CHEST, ABDOMEN AND PELVIS TECHNIQUE: Multidetector CT imaging through the chest, abdomen and pelvis was performed using the standard protocol during bolus administration of intravenous contrast. Multiplanar reconstructed images and MIPs were obtained and reviewed to evaluate the vascular anatomy. CONTRAST:  100mL ISOVUE-370 IOPAMIDOL (ISOVUE-370) INJECTION 76% COMPARISON:  AP CTA on 02/07/2014; no prior chest CTA FINDINGS: CTA CHEST FINDINGS Cardiovascular: Moderate cardiomegaly, without pericardial effusion. Aortic and coronary artery atherosclerosis. Aneurysm of the ascending aorta is seen measuring 4.3 cm. No evidence of aortic dissection. Mediastinum/Nodes: No masses or pathologically enlarged lymph nodes identified. Large hiatal hernia. Lungs/Pleura: No pulmonary mass, infiltrate, or effusion. Upper abdomen: No acute findings. Musculoskeletal: No suspicious bone lesions identified. Review of the MIP images confirms the above findings. CTA ABDOMEN AND PELVIS FINDINGS VASCULAR Aorta: Aorto bi-iliac stent graft remains in place. The native abdominal aortic aneurysm is measures 8.4 cm in maximum diameter compared to  6.7 cm in 2015. No evidence of endoleak. No evidence of retroperitoneal hemorrhage. Celiac: Patent without evidence of aneurysm, dissection, vasculitis or significant stenosis. SMA: Patent without evidence of aneurysm, dissection, vasculitis or significant stenosis. Renals: Both renal arteries are patent without evidence of aneurysm, dissection, vasculitis, fibromuscular dysplasia or significant stenosis. IMA: Patent without evidence of aneurysm, dissection, vasculitis or significant stenosis. Inflow: Patent without evidence of aneurysm, dissection, vasculitis or significant stenosis. Veins: No obvious venous abnormality within the limitations of this arterial phase study. Review of the MIP images confirms the above findings. NON-VASCULAR Hepatobiliary: Suboptimal visualization due to beam hardening artifact from contrast bolus in the heart and aorta, but no definite hepatic abnormality identified. Small calcified gallstone noted, without evidence of cholecystitis. Pancreas:  No mass or inflammatory changes. Spleen: Within normal limits in size and appearance. Adrenals/Urinary Tract: No masses identified. No evidence of hydronephrosis. Stomach/Bowel: No evidence of obstruction, inflammatory process or abnormal fluid collections. Large hiatal hernia. Diffuse gaseous distention of the colon noted, without evidence of obstruction, highly suspicious for colonic ileus. Vascular/Lymphatic: No pathologically enlarged lymph nodes. No abdominal aortic aneurysm. Reproductive:  No mass or other significant abnormality. Other:  None. Musculoskeletal:  No suspicious bone lesions identified. Review of the MIP images confirms the above findings. IMPRESSION: 4.3 cm ascending thoracic aortic aneurysm. No evidence of aortic dissection. Previous stent graft repair of abdominal aortic aneurysm. Native aneurysm sac now measures 8.4 cm in maximum diameter, compared to 6.7 cm in 2015. No evidence of endoleak or retroperitoneal hemorrhage.  Stable large hiatal hernia. Probable colonic ileus. Cholelithiasis.  No radiographic evidence of cholecystitis. Electronically Signed   By: Myles RosenthalJohn  Stahl M.D.   On: 04/05/2017 20:00        Scheduled Meds: . atorvastatin  80 mg Oral q1800  . cholecalciferol  2,000 Units Oral Daily  . enoxaparin (LOVENOX) injection  30 mg Subcutaneous Q24H  . feeding supplement  1 Container Oral TID BM  . finasteride  5 mg Oral Daily  . gabapentin  100 mg Oral QHS  . levothyroxine  50 mcg Oral QAC breakfast  . losartan  25 mg Oral Daily  . oxybutynin  5 mg Oral Daily  . oxyCODONE  10 mg Oral Q12H  . pantoprazole  40 mg Oral Daily  . polyethylene glycol  17 g Oral Daily  . senna-docusate  1 tablet Oral QODAY   Continuous Infusions:    LOS: 0 days  Time spent: 25 min    Joseph Art, DO [facility] Pager [phone]  If 7PM-7AM, please contact night-coverage [email or web address] Password [facility] 04/07/2017, 12:11 PM

## 2017-04-07 NOTE — Evaluation (Signed)
Physical Therapy Evaluation Patient Details Name: Ricardo GanserHerbert W Mctigue MRN: [id number] DOB: [birth year] Today's Date: 04/07/2017   History of Present Illness  [age] y.o. male, w hypothyroidism, hypertension, hyperlipidemia, PUD, CHF, AAA, presents with c/o intractable nausea and vomitting today as well as some right lower abdominal discomfort and constipation. admitted for nausea and vomitting and worsening AAA, and slight troponin elevation.  Clinical Impression  Pt admitted with above diagnosis. Pt currently with functional limitations due to the deficits listed below (see PT Problem List). PTA pt living in ALF and requiring RW for ambulation of under 500 feet and scooter usage for longer distances. Pt also independent with ADLs. Pt currently is supervision for bed mobility, and transfers and hands on min guard for ambulation of 200 feet. Pt is close to his baseline level of function, however would benefit from skilled PT in the acute setting for strengthening and endurance training as well as instruction on safety awareness so that he can safely return to his PLOF at his ALF.      Follow Up Recommendations No PT follow up    Equipment Recommendations  None recommended by PT    Recommendations for Other Services       Precautions / Restrictions Precautions Precautions: Fall Restrictions Weight Bearing Restrictions: No      Mobility  Bed Mobility Overal bed mobility: Needs Assistance Bed Mobility: Supine to Sit     Supine to sit: Supervision     General bed mobility comments: supervision for safety, use of bedrail to pull to EoB  Transfers Overall transfer level: Needs assistance Equipment used: Rolling walker (2 wheeled) Transfers: Sit to/from Stand Sit to Stand: Supervision         General transfer comment: supervision for safety, able to power up and steady before reaching for RW  Ambulation/Gait Ambulation/Gait assistance: Min guard Ambulation Distance (Feet): 200  Feet Assistive device: Rolling walker (2 wheeled) Gait Pattern/deviations: Step-through pattern;Shuffle;Trunk flexed Gait velocity: WFL  Gait velocity interpretation: at or above normal speed for age/gender General Gait Details: hands on min guard for safety, steady gait, no LoB, verbal and tactile cues for proximity to walker,      Balance Overall balance assessment: Needs assistance Sitting-balance support: No upper extremity supported;Feet supported Sitting balance-Leahy Scale: Good     Standing balance support: No upper extremity supported Standing balance-Leahy Scale: Fair                               Pertinent Vitals/Pain Pain Assessment: Faces Faces Pain Scale: Hurts a little bit Pain Location: OA in L shoulder and back Pain Descriptors / Indicators: Guarding;Grimacing Pain Intervention(s): Limited activity within patient's tolerance;Monitored during session;Repositioned    Home Living Family/patient expects to be discharged to:: Assisted living([facility])                      Prior Function Level of Independence: Needs assistance   Gait / Transfers Assistance Needed: uses RW in room and hallway, uses scooter to get to dining room due to increased distance  ADL's / Homemaking Assistance Needed: reports independent with ADLs           Extremity/Trunk Assessment   Upper Extremity Assessment Upper Extremity Assessment: LUE deficits/detail;Generalized weakness LUE Deficits / Details: decreased L shoulder ROM due to OA    Lower Extremity Assessment Lower Extremity Assessment: Generalized weakness    Cervical / Trunk Assessment Cervical /  Trunk Assessment: Kyphotic  Communication   Communication: HOH(utilize note pad for communication)  Cognition Arousal/Alertness: Awake/alert Behavior During Therapy: WFL for tasks assessed/performed Overall Cognitive Status: Within Functional Limits for tasks assessed                                         General Comments General comments (skin integrity, edema, etc.): VSS throughout session, max HR with ambulation 78 bpm        Assessment/Plan    PT Assessment Patient needs continued PT services  PT Problem List Decreased balance;Decreased knowledge of use of DME;Decreased safety awareness;Decreased strength       PT Treatment Interventions DME instruction;Gait training;Functional mobility training;Therapeutic activities;Therapeutic exercise;Balance training;Patient/family education    PT Goals (Current goals can be found in the Care Plan section)  Acute Rehab PT Goals Patient Stated Goal: go back to [facility] PT Goal Formulation: With patient Time For Goal Achievement: 04/21/17 Potential to Achieve Goals: Good    Frequency Min 3X/week    AM-PAC PT "6 Clicks" Daily Activity  Outcome Measure Difficulty turning over in bed (including adjusting bedclothes, sheets and blankets)?: A Little Difficulty moving from lying on back to sitting on the side of the bed? : A Little Difficulty sitting down on and standing up from a chair with arms (e.g., wheelchair, bedside commode, etc,.)?: A Little Help needed moving to and from a bed to chair (including a wheelchair)?: A Little Help needed walking in hospital room?: A Little Help needed climbing 3-5 steps with a railing? : A Lot 6 Click Score: 17    End of Session Equipment Utilized During Treatment: Gait belt   Patient left: in chair;with call bell/phone within reach;with chair alarm set Nurse Communication: Mobility status PT Visit Diagnosis: Other abnormalities of gait and mobility (R26.89);Muscle weakness (generalized) (M62.81);History of falling (Z91.81)    Time: 7829-5621 PT Time Calculation (min) (ACUTE ONLY): 25 min   Charges:   PT Evaluation $PT Eval Moderate Complexity: 1 Mod PT Treatments $Gait Training: 8-22 mins   PT G Codes:        Kendric Sindelar B. Beverely Risen PT, DPT Acute  Rehabilitation  [phone] Pager [phone]    Elon Alas Fleet 04/07/2017, 9:31 AM

## 2017-04-07 NOTE — Progress Notes (Signed)
Initial Nutrition Assessment  DOCUMENTATION CODES:   Severe malnutrition in context of chronic illness  INTERVENTION:  1. Boost Breeze po TID, each supplement provides 250 kcal and 9 grams of protein 2. Magic cup TID with meals, each supplement provides 290 kcal and 9 grams of protein w/ diet advancement  NUTRITION DIAGNOSIS:   Severe Malnutrition related to chronic illness as evidenced by severe muscle depletion, severe fat depletion  GOAL:   Patient will meet greater than or equal to 90% of their needs  MONITOR:   PO intake, I & O's, Labs, Weight trends, Skin  REASON FOR ASSESSMENT:   Malnutrition Screening Tool    ASSESSMENT:   Ricardo [name]  is a [age] y.o. male, w hypothyroidism, hypertension, hyperlipidemia, PUD, CHF, AAA, presents with c/o intractable nausea and vomitting today as well as some right lower abdominal discomfort and constipation.  Spoke with patient and family at bedside. Patient is hard of hearing, all history provided by family. They state patient has lost 35 pounds over the past 6 months.Per chart his weight appears to have been stable over the past 6 months. Family states he has been "picking at his food," eating much less. [facility] all [facility] for him. Family does relate some coughing with thin liquids, but otherwise no issues chewing/swallowing. Nausea and vomiting has resolved. Now on Full Liquids. Drinking boost breeze during stay.  NUTRITION - FOCUSED PHYSICAL EXAM:    Most Recent Value  Orbital Region  Severe depletion  Upper Arm Region  Severe depletion  Thoracic and Lumbar Region  Severe depletion  Buccal Region  Severe depletion  Temple Region  Severe depletion  Clavicle Bone Region  Severe depletion  Clavicle and Acromion Bone Region  Severe depletion  Scapular Bone Region  Severe depletion  Dorsal Hand  Severe depletion  Patellar Region  Severe depletion  Anterior Thigh Region   Severe depletion  Posterior Calf Region  Severe depletion  Edema (RD Assessment)  None  Hair  Reviewed  Eyes  Reviewed  Mouth  Reviewed  Skin  Reviewed  Nails  Reviewed       Diet Order:  Diet full liquid Room service appropriate? Yes; Fluid consistency: Thin  EDUCATION NEEDS:   Education needs have been addressed  Skin:  Skin Assessment: Reviewed RN Assessment  Last BM:  PTA  Height:   Ht Readings from Last 1 Encounters:  03/23/17 5\' 9"  (1.753 m)    Weight:   Wt Readings from Last 1 Encounters:  04/05/17 136 lb 0.4 oz (61.7 kg)    Ideal Body Weight:  72.72 kg  BMI:  Body mass index is 20.09 kg/m.  Estimated Nutritional Needs:   Kcal:  1550-1850 calories  Protein:  92-105 grams (1.5-1.7g/kg)  Fluid:  Per MD  Dionne AnoWilliam M. Sania Noy, MS, RD LDN Inpatient Clinical Dietitian Pager [phone]

## 2017-04-07 NOTE — Clinical Social Work Note (Signed)
Clinical Social Work Assessment  Patient Details  Name: Ricardo GanserHerbert W Laine MRN: [id number] Date of Birth: [birth year]  Date of referral:  04/07/17               Reason for consult:  Facility Placement                Permission sought to share information with:  Facility Medical sales representativeContact Representative, Family Supports Permission granted to share information::  Yes, Verbal Permission Granted  Name::     Aneta Minshillip  Agency::  [facility]  Relationship::  Son  Contact Information:  [phone]  Housing/Transportation Living arrangements for the past 2 months:  Assisted Living Facility Source of Information:  Patient, Adult Children Patient Interpreter Needed:  None Criminal Activity/Legal Involvement Pertinent to Current Situation/Hospitalization:  No - Comment as needed Significant Relationships:  Adult Children Lives with:  Facility Resident Do you feel safe going back to the place where you live?  Yes Need for family participation in patient care:  Yes (Comment)  Care giving concerns:  CSW received consult regarding discharge planning. [facility] and will return there at discharge. CSW to continue to follow and assist with discharge planning needs.   Social Worker assessment / plan:  CSW spoke with patient and his son concerning return to ALF.  Employment status:  Retired Database administratornsurance information:  Managed Medicare PT Recommendations:  No Follow Up Information / Referral to community resources:     Patient/Family's Response to care:  Patient reports understanding of discharge plan and will discharge by PTAR.   Patient/Family's Understanding of and Emotional Response to Diagnosis, Current Treatment, and Prognosis:  Patient/family is realistic regarding therapy needs and expressed being hopeful for return to ALF placement. Patient expressed understanding of CSW role and discharge process as well as medical condition. No questions/concerns about plan or  treatment.    Emotional Assessment Appearance:  Appears stated age Attitude/Demeanor/Rapport:  Gracious Affect (typically observed):  Accepting, Appropriate Orientation:  Oriented to Self, Oriented to Situation, Oriented to Place, Oriented to  Time Alcohol / Substance use:  Not Applicable Psych involvement (Current and /or in the community):  No (Comment)  Discharge Needs  Concerns to be addressed:  Care Coordination Readmission within the last 30 days:  No Current discharge risk:  None Barriers to Discharge:  No Barriers Identified   Mearl Latinadia S Leotha Westermeyer, LCSWA 04/07/2017, 12:21 PM

## 2017-04-08 ENCOUNTER — Telehealth: Payer: Self-pay

## 2017-04-08 DIAGNOSIS — I714 Abdominal aortic aneurysm, without rupture: Secondary | ICD-10-CM

## 2017-04-08 DIAGNOSIS — E43 Unspecified severe protein-calorie malnutrition: Secondary | ICD-10-CM

## 2017-04-08 LAB — URINALYSIS, ROUTINE W REFLEX MICROSCOPIC
Bacteria, UA: NONE SEEN
Bilirubin Urine: NEGATIVE
Glucose, UA: NEGATIVE mg/dL
Ketones, ur: NEGATIVE mg/dL
Leukocytes, UA: NEGATIVE
Nitrite: NEGATIVE
Protein, ur: NEGATIVE mg/dL
Specific Gravity, Urine: 1.005 (ref 1.005–1.030)
Squamous Epithelial / HPF: NONE SEEN
pH: 5 (ref 5.0–8.0)

## 2017-04-08 NOTE — Progress Notes (Signed)
Physical Therapy Treatment Patient Details Name: Ricardo GanserHerbert W Bechtol MRN: [id number] DOB: [birth year] Today's Date: 04/08/2017    History of Present Illness 84 y.o. male, w hypothyroidism, hypertension, hyperlipidemia, PUD, CHF, AAA, presents with c/o intractable nausea and vomitting today as well as some right lower abdominal discomfort and constipation. admitted for nausea and vomitting and worsening AAA, and slight troponin elevation.    PT Comments    Pt progressing towards goals. Tolerated gait training well with RW, requiring min guard A for mobility. Educated and performed LE HEP. Current recommendations appropriate. Will continue to follow acutely to maximize functional mobility independence and safety.    Follow Up Recommendations  No PT follow up     Equipment Recommendations  None recommended by PT    Recommendations for Other Services       Precautions / Restrictions Precautions Precautions: Fall Restrictions Weight Bearing Restrictions: No    Mobility  Bed Mobility Overal bed mobility: Needs Assistance Bed Mobility: Supine to Sit;Sit to Supine     Supine to sit: Supervision Sit to supine: Supervision   General bed mobility comments: Supervision for safety. Required use of bed rail for trunk elevation.   Transfers Overall transfer level: Needs assistance Equipment used: Rolling walker (2 wheeled) Transfers: Sit to/from Stand Sit to Stand: Supervision         General transfer comment: Supervision for safety. Increased time required.  Ambulation/Gait Ambulation/Gait assistance: Min guard Ambulation Distance (Feet): 250 Feet Assistive device: Rolling walker (2 wheeled) Gait Pattern/deviations: Step-through pattern;Shuffle;Trunk flexed Gait velocity: WFL  Gait velocity interpretation: at or above normal speed for age/gender General Gait Details: Overall steady gait. Min guard for safety. Verbal and manual cues for proximity to device. No LOB noted.     Stairs            Wheelchair Mobility    Modified Rankin (Stroke Patients Only)       Balance Overall balance assessment: Needs assistance Sitting-balance support: No upper extremity supported;Feet supported Sitting balance-Leahy Scale: Good     Standing balance support: Bilateral upper extremity supported;During functional activity Standing balance-Leahy Scale: Poor Standing balance comment: Reliant on BUE support                             Cognition Arousal/Alertness: Awake/alert Behavior During Therapy: WFL for tasks assessed/performed Overall Cognitive Status: Within Functional Limits for tasks assessed                                        Exercises General Exercises - Lower Extremity Ankle Circles/Pumps: AROM;Both;10 reps Long Arc Quad: AROM;Both;10 reps;Seated Hip Flexion/Marching: AROM;Both;10 reps;Seated    General Comments        Pertinent Vitals/Pain Pain Assessment: Faces Faces Pain Scale: Hurts a little bit Pain Location: OA in L shoulder and back Pain Descriptors / Indicators: Guarding;Grimacing Pain Intervention(s): Limited activity within patient's tolerance;Monitored during session;Repositioned    Home Living                      Prior Function            PT Goals (current goals can now be found in the care plan section) Acute Rehab PT Goals Patient Stated Goal: go back to [facility] PT Goal Formulation: With patient Time For Goal Achievement: 04/21/17 Potential to Achieve Goals: Good  Progress towards PT goals: Progressing toward goals    Frequency    Min 3X/week      PT Plan Current plan remains appropriate    Co-evaluation              AM-PAC PT "6 Clicks" Daily Activity  Outcome Measure  Difficulty turning over in bed (including adjusting bedclothes, sheets and blankets)?: A Little Difficulty moving from lying on back to sitting on the side of the bed? : A  Little Difficulty sitting down on and standing up from a chair with arms (e.g., wheelchair, bedside commode, etc,.)?: A Little Help needed moving to and from a bed to chair (including a wheelchair)?: A Little Help needed walking in hospital room?: A Little Help needed climbing 3-5 steps with a railing? : A Lot 6 Click Score: 17    End of Session Equipment Utilized During Treatment: Gait belt Activity Tolerance: Patient tolerated treatment well Patient left: in bed;with call bell/phone within reach Nurse Communication: Mobility status PT Visit Diagnosis: Other abnormalities of gait and mobility (R26.89);Muscle weakness (generalized) (M62.81);History of falling (Z91.81)     Time: 1610-9604 PT Time Calculation (min) (ACUTE ONLY): 14 min  Charges:  $Gait Training: 8-22 mins                    G Codes:       Gladys Damme, PT, DPT  Acute Rehabilitation Services  Pager: [phone]    Lehman Prom 04/08/2017, 12:47 PM

## 2017-04-08 NOTE — NC FL2 (Signed)
[place] MEDICAID FL2 LEVEL OF CARE SCREENING TOOL     IDENTIFICATION  Patient Name: Ricardo [name] Birthdate: Apr 21, 1913 Sex: male Admission Date (Current Location): 04/05/2017  [facility] and [place] Number:  Producer, television/film/video and Address:  [facility], [address], [place], [place] [address]      Provider Number: [id number]  Attending Physician Name and Address:  Joseph Art, DO  Relative Name and Phone Number:  Aneta Mins, son, [phone]    Current Level of Care: Hospital Recommended Level of Care: Assisted Living Facility Prior Approval Number:    Date Approved/Denied:   PASRR Number:    Discharge Plan: Other (Comment)(ALF)    Current Diagnoses: Patient Active Problem List   Diagnosis Date Noted  . Protein-calorie malnutrition, severe 04/07/2017  . Nausea and vomiting 04/07/2017  . Nausea & vomiting 04/05/2017  . Tachycardia 04/05/2017  . Neuropathic pain 12/08/2016  . Anemia of chronic disease 12/08/2016  . DNR (do not resuscitate)   . Cough 08/07/2015  . Chronic mucus hypersecretion, respiratory 04/10/2015  . Xerostomia 03/27/2015  . Edema 03/27/2015  . Bradycardia by electrocardiogram 09/22/2014  . Spinal stenosis in cervical region 09/06/2013  . Deaf 06/21/2013  . Lumbago 06/21/2013  . Constipation 03/08/2013  . Hypothyroidism 03/08/2013  . BPH (benign prostatic hyperplasia) 03/08/2013  . Depression with anxiety 03/08/2013  . Fall at nursing home 03/08/2013  . AAA (abdominal aortic aneurysm) (HCC) 01/27/2012  . Abnormality of gait 08/01/2010  . Osteoarthritis 05/17/2009  . GERD 03/16/2009  . ULCER-GASTRIC 03/16/2009  . DIVERTICULOSIS-COLON 03/16/2009  . Dyslipidemia 01/30/2009  . Iron deficiency anemia 01/30/2009  . Essential hypertension 01/30/2009  . VERTIGO 01/30/2009  . Chronic kidney disease, stage III (moderate) (HCC) 01/30/2009    Orientation RESPIRATION BLADDER Height & Weight     Self, Time,  Situation, Place  Normal Continent Weight: 136 lb 0.4 oz (61.7 kg) Height:     BEHAVIORAL SYMPTOMS/MOOD NEUROLOGICAL BOWEL NUTRITION STATUS  (N/A)   Continent (regular soft diet with supplementations)  AMBULATORY STATUS COMMUNICATION OF NEEDS Skin   Supervision Verbally Normal                       Personal Care Assistance Level of Assistance  Bathing, Feeding, Dressing Bathing Assistance: Limited assistance Feeding assistance: Independent Dressing Assistance: Independent     Functional Limitations Info  Hearing   Hearing Info: Impaired      SPECIAL CARE FACTORS FREQUENCY  PT (By licensed PT), OT (By licensed OT)     PT Frequency: 3/wk with home health OT Frequency: 3/wk with home health            Contractures      Additional Factors Info  Code Status, Allergies Code Status Info: DNR Allergies Info: Aleve Naproxen, Indocin Indomethacin              Discharge Medications: Medication List     STOP taking these medications   ciprofloxacin 500 MG tablet Commonly known as:  CIPRO   metroNIDAZOLE 500 MG tablet Commonly known as:  FLAGYL     TAKE these medications   ARTIFICIAL TEARS 1.4 % ophthalmic solution Generic drug:  polyvinyl alcohol Place 1 drop into both eyes as needed.   ASPERCREME LIDOCAINE 4 % Ptch Generic drug:  Lidocaine Apply 1 patch topically daily. Leave on for 12 hours   benzocaine 10 % mucosal gel Commonly known as:  ORAJEL Use as directed 1 application in  the mouth or throat as needed for mouth pain.   BREATHE RIGHT Strp Apply 1 strip topically at bedtime.   cholecalciferol 1000 units tablet Commonly known as:  VITAMIN D Take 2,000 Units by mouth daily.   finasteride 5 MG tablet Commonly known as:  PROSCAR Take 5 mg by mouth daily.   fluocinonide 0.05 % external solution Commonly known as:  LIDEX Apply 1 application topically as needed (for itching).   gabapentin 100 MG capsule Commonly known as:   NEURONTIN Take 100 mg by mouth daily with supper.   guaiFENesin 100 MG/5ML liquid Commonly known as:  ROBITUSSIN Take 200 mg by mouth every 4 (four) hours as needed for cough or congestion.   ketoconazole 2 % shampoo Commonly known as:  NIZORAL Apply 1 application topically once a week.   levothyroxine 50 MCG tablet Commonly known as:  SYNTHROID, LEVOTHROID Take 50 mcg by mouth daily before breakfast.   losartan 25 MG tablet Commonly known as:  COZAAR Take 25 mg by mouth daily.   magic mouthwash w/lidocaine Soln Take 5-10 mLs by mouth every 4 (four) hours as needed for mouth pain.   NON FORMULARY Take 1 capsule by mouth as directed. Beneflex--may self-administer and keep at bedside   NON FORMULARY Take 1-1.5 oz by mouth daily as needed (upon requst). Alcoholic Beverage of Choice   omeprazole 20 MG capsule Commonly known as:  PRILOSEC Take 20 mg by mouth daily.   oxybutynin 5 MG 24 hr tablet Commonly known as:  DITROPAN-XL Take 5 mg by mouth daily.   oxyCODONE 5 MG immediate release tablet Commonly known as:  Oxy IR/ROXICODONE Take one tablet by mouth twice daily as needed for breakthrough pain What changed:    how much to take  how to take this  when to take this  reasons to take this  additional instructions   oxyCODONE 10 mg 12 hr tablet Commonly known as:  OXYCONTIN Take one tablet by mouth every morning for pain. Do not crush or chew. What changed:  Another medication with the same name was changed. Make sure you understand how and when to take each.   polyethylene glycol packet Commonly known as:  MIRALAX / GLYCOLAX Take 17 g by mouth daily.   saccharomyces boulardii 250 MG capsule Commonly known as:  FLORASTOR Take 250 mg by mouth 2 (two) times daily.   sennosides-docusate sodium 8.6-50 MG tablet Commonly known as:  SENOKOT-S Take 1 tablet by mouth every other day.   sennosides-docusate sodium 8.6-50 MG tablet Commonly known  as:  SENOKOT-S Take 1 tablet by mouth daily as needed for constipation.   sodium chloride 0.65 % Soln nasal spray Commonly known as:  OCEAN Place 1 spray into both nostrils 3 (three) times daily as needed for congestion.   triamcinolone 0.1 % paste Commonly known as:  KENALOG Use as directed 1 application in the mouth or throat as directed. 3-6 times daily         Relevant Imaging Results:  Relevant Lab Results:   Additional Information SSN: [id number]  Burna SisUris, Shalom Mcguiness H, LCSW

## 2017-04-08 NOTE — Telephone Encounter (Signed)
Possible re-admission to facility. [facility] is [facility] is needed if patient was re-admitted to facility upon discharge. [facility].

## 2017-04-08 NOTE — Progress Notes (Signed)
Patient will DC to: [facility] Anticipated DC date: 04/08/17 Family notified: Son Transport by: Sharin MonsPTAR 12pm   Per MD patient ready for DC to ALF. RN, patient, patient's family, and facility notified of DC. Discharge Summary sent to facility. RN given number for report [phone]). DC packet on chart. Ambulance transport requested for patient.   CSW signing off.  Cristobal GoldmannNadia Darwyn Ponzo, [place]CSWA Clinical Social Worker [phone]

## 2017-04-08 NOTE — Progress Notes (Signed)
Patient discharge teaching given, including activity, diet, follow-up appoints, and medications. Patient verbalized understanding of all discharge instructions. IV access was d/c'd. Vitals are stable. Skin is intact except as charted in most recent assessments. Pt to be escorted out by PTAR.                                [facility] [facility].

## 2017-04-08 NOTE — Discharge Summary (Addendum)
Physician Discharge Summary  LEWIN PELLOW ZOX:[id number] DOB: December 18, 1913 DOA: 04/05/2017  PCP: Oneal Grout, MD  Admit date: 04/05/2017 Discharge date: 04/08/2017   Recommendations for Outpatient Follow-Up:   1. Aspiration precuations 2. DNR 3. Bowel regimen for daily or every other day BMs-- would use suppositories/enemas for results 4. Limits pain medications-- consider movantik etc if constipation continues to be an issue 5. Increase activity 6. ? Palliative care discussion with family-- given MOST form to look over   Discharge Diagnosis:   Principal Problem:   Nausea & vomiting Active Problems:   AAA (abdominal aortic aneurysm) (HCC)   Anemia of chronic disease   Tachycardia   Protein-calorie malnutrition, severe   Nausea and vomiting   Discharge disposition:  ALF  Discharge Condition: Improved.  Diet recommendation: regular soft diet with supplementations  Wound care: None.   History of Present Illness:     Ricardo [name]  is a 84 y.o. male, w hypothyroidism, hypertension, hyperlipidemia, PUD, CHF, AAA, presents with c/o intractable nausea and vomitting today as well as some right lower abdominal discomfort and constipation.  Pt denies fever, chills, cough, cp, palp, heartburn, diarrhea, brbpr, black stool .  Pt presented to ED for evaluation.   In Ed,  CTA chest / abd / pelvis IMPRESSION: 4.3 cm ascending thoracic aortic aneurysm. No evidence of aortic dissection. Previous stent graft repair of abdominal aortic aneurysm. Native aneurysm sac now measures 8.4 cm in maximum diameter, compared to 6.7 cm in 2015. No evidence of endoleak or retroperitoneal hemorrhage. Stable large hiatal hernia. Probable colonic ileus. Cholelithiasis. No radiographic evidence of cholecystitis.  Wbc 7.5, Hgb 12.0, Plt 160 Na 137, K 4.6, Bun 24, Creatinine 1.23 Ast 29, Alt 11 Glucose 163 INR 1.11 PTT 27  Trop 0.10    EKG Afib at 90 nl axis LAD, LBBB, slight  st depression V6  Pt will be admitted for nausea and vomitting and worsening AAA, and slight troponin elevation.       Hospital Course by Problem:   Nausea and vomiting due to ileus Large bowel movement yesterday -bowel regimen as is on chronic opioids- will need close monitoring -tolerating diet -no nausea, + BS  AAA (8.4cm) Vascular surgery consulted by ED:  Pt has had prior aneurysm stent graft repair by Dr Hart Rochester in 2012. This graft has slipped down into the aorta and the aneurysm is not excluded. This has been known since at least 2015. At that time he was deemed to not be a candidate for further intervention due to his age and morbidity associated with aneurysm. I would agree that pt is not a candidate for repair after review of his CT from last evening. No further follow up with vascular surgery necessary.  If aneurysm ruptures would proceed with palliative care for pain control.  Tachy/brady- sinus (not convinced a fib) cardiac echo: Compared to the previous study, aortic stenosis is slghtly worse, but LV function has improved. Discussed with cardiology and no intervention due to age/co-morbidities-- updated family  Trop elevation -trending down, suspect due to demand from N/V  Hypertension -resume home meds- adjust as needed  Hypothyroidism Cont levothyroxine  Gerd/ PUD prilosec  Hyperglycemia-- ? From reaction to stress Hga1c: 5.6  Malnutrition Malnutrition Type:  Nutrition Problem: Severe Malnutrition Etiology: chronic illness   Malnutrition Characteristics:  Signs/Symptoms: severe muscle depletion, severe fat depletion   Nutrition Interventions:  Interventions: Boost Breeze        Medical Consultants:    Vascular  Cards (  phone)   Discharge Exam:   Vitals:   04/07/17 2235 04/08/17 0544  BP:  (!) 151/75  Pulse:  (!) 52  Resp:  12  Temp: 98 F (36.7 C) (!) 97.5 F (36.4 C)  SpO2:  95%   Vitals:   04/07/17 1419  04/07/17 2104 04/07/17 2235 04/08/17 0544  BP: (!) 119/49 (!) 148/65  (!) 151/75  Pulse: 63 60  (!) 52  Resp: 16 17  12   Temp: 97.9 F (36.6 C) (!) 97.3 F (36.3 C) 98 F (36.7 C) (!) 97.5 F (36.4 C)  TempSrc: Oral Oral Oral Oral  SpO2: 98% 94%  95%  Weight:        Gen:  NAD- sitting up with grandson eating breakfast-- asking about going home   The results of significant diagnostics from this hospitalization (including imaging, microbiology, ancillary and laboratory) are listed below for reference.     Procedures and Diagnostic Studies:   Ct Angio Chest/abd/pel For Dissection W And/or Wo Contrast  Result Date: 04/05/2017 CLINICAL DATA:  Right lower quadrant pain and nausea beginning last night. Palpable abdominal mass. Previous abdominal aortic aneurysm repair and diverticulosis. EXAM: CT ANGIOGRAPHY CHEST, ABDOMEN AND PELVIS TECHNIQUE: Multidetector CT imaging through the chest, abdomen and pelvis was performed using the standard protocol during bolus administration of intravenous contrast. Multiplanar reconstructed images and MIPs were obtained and reviewed to evaluate the vascular anatomy. CONTRAST:  ISOVUE-370 IOPAMIDOL (ISOVUE-370) INJECTION 76% COMPARISON:  AP CTA on 02/07/2014; no prior chest CTA FINDINGS: CTA CHEST FINDINGS Cardiovascular: Moderate cardiomegaly, without pericardial effusion. Aortic and coronary artery atherosclerosis. Aneurysm of the ascending aorta is seen measuring 4.3 cm. No evidence of aortic dissection. Mediastinum/Nodes: No masses or pathologically enlarged lymph nodes identified. Large hiatal hernia. Lungs/Pleura: No pulmonary mass, infiltrate, or effusion. Upper abdomen: No acute findings. Musculoskeletal: No suspicious bone lesions identified. Review of the MIP images confirms the above findings. CTA ABDOMEN AND PELVIS FINDINGS VASCULAR Aorta: Aorto bi-iliac stent graft remains in place. The native abdominal aortic aneurysm is measures 8.4 cm in  maximum diameter compared to 6.7 cm in 2015. No evidence of endoleak. No evidence of retroperitoneal hemorrhage. Celiac: Patent without evidence of aneurysm, dissection, vasculitis or significant stenosis. SMA: Patent without evidence of aneurysm, dissection, vasculitis or significant stenosis. Renals: Both renal arteries are patent without evidence of aneurysm, dissection, vasculitis, fibromuscular dysplasia or significant stenosis. IMA: Patent without evidence of aneurysm, dissection, vasculitis or significant stenosis. Inflow: Patent without evidence of aneurysm, dissection, vasculitis or significant stenosis. Veins: No obvious venous abnormality within the limitations of this arterial phase study. Review of the MIP images confirms the above findings. NON-VASCULAR Hepatobiliary: Suboptimal visualization due to beam hardening artifact from contrast bolus in the heart and aorta, but no definite hepatic abnormality identified. Small calcified gallstone noted, without evidence of cholecystitis. Pancreas:  No mass or inflammatory changes. Spleen: Within normal limits in size and appearance. Adrenals/Urinary Tract: No masses identified. No evidence of hydronephrosis. Stomach/Bowel: No evidence of obstruction, inflammatory process or abnormal fluid collections. Large hiatal hernia. Diffuse gaseous distention of the colon noted, without evidence of obstruction, highly suspicious for colonic ileus. Vascular/Lymphatic: No pathologically enlarged lymph nodes. No abdominal aortic aneurysm. Reproductive:  No mass or other significant abnormality. Other:  None. Musculoskeletal:  No suspicious bone lesions identified. Review of the MIP images confirms the above findings. IMPRESSION: 4.3 cm ascending thoracic aortic aneurysm. No evidence of aortic dissection. Previous stent graft repair of abdominal aortic aneurysm. Native aneurysm sac now  measures 8.4 cm in maximum diameter, compared to 6.7 cm in 2015. No evidence of endoleak  or retroperitoneal hemorrhage. Stable large hiatal hernia. Probable colonic ileus. Cholelithiasis.  No radiographic evidence of cholecystitis. Electronically Signed   By: Myles Rosenthal M.D.   On: 04/05/2017 20:00     Labs:   Basic Metabolic Panel: Recent Labs  Lab 04/05/17 1846 04/06/17 0821 04/07/17 0854  NA 137 136 134*  K 4.6 4.6 4.5  CL 101 106 104  CO2 24 21* 20*  GLUCOSE 163* 76 104*  BUN 24* 28* 26*  CREATININE 1.23 1.12 1.15  CALCIUM 9.3 8.5* 8.6*   GFR Estimated Creatinine Clearance: 29.1 mL/min (by C-G formula based on SCr of 1.15 mg/dL). Liver Function Tests: Recent Labs  Lab 04/05/17 1846 04/06/17 0821  AST 29 24  ALT 11* 11*  ALKPHOS 104 75  BILITOT 0.9 0.8  PROT 7.1 5.6*  ALBUMIN 3.8 3.0*   No results for input(s): LIPASE, AMYLASE in the last 168 hours. No results for input(s): AMMONIA in the last 168 hours. Coagulation profile Recent Labs  Lab 04/05/17 1846  INR 1.11    CBC: Recent Labs  Lab 04/05/17 1846 04/06/17 0821 04/07/17 0854  WBC 7.5 7.3 8.0  NEUTROABS 6.3  --   --   HGB 12.0* 10.3* 10.9*  HCT 36.6* 31.7* 34.1*  MCV 88.6 88.5 89.3  PLT 160 138* 153   Cardiac Enzymes: Recent Labs  Lab 04/06/17 0821 04/06/17 1430 04/06/17 2017  TROPONINI 0.14* 0.10* 0.09*   BNP: Invalid input(s): POCBNP CBG: No results for input(s): GLUCAP in the last 168 hours. D-Dimer No results for input(s): DDIMER in the last 72 hours. Hgb A1c Recent Labs    04/06/17 0821  HGBA1C 5.6   Lipid Profile Recent Labs    04/06/17 0821  CHOL 145  HDL 47  LDLCALC 89  TRIG 47  CHOLHDL 3.1   Thyroid function studies Recent Labs    04/06/17 0821  TSH 0.936   Anemia work up No results for input(s): VITAMINB12, FOLATE, FERRITIN, TIBC, IRON, RETICCTPCT in the last 72 hours. Microbiology Recent Results (from the past 240 hour(s))  MRSA PCR Screening     Status: None   Collection Time: 04/05/17 10:34 PM  Result Value Ref Range Status   MRSA by  PCR NEGATIVE NEGATIVE Final    Comment:        The GeneXpert MRSA Assay (FDA approved for NASAL specimens only), is one component of a comprehensive MRSA colonization surveillance program. It is not intended to diagnose MRSA infection nor to guide or monitor treatment for MRSA infections.      Discharge Instructions:   Discharge Instructions    Discharge instructions   Complete by:  As directed    Ensure dietary supplements DNR status Ensure bowel movements every day or every other day, if not would use suppository/enema-- consider movantik or other medication for opioid induced constipation Soft diet   Increase activity slowly   Complete by:  As directed      Allergies as of 04/08/2017      Reactions   Aleve [naproxen] Other (See Comments)   Per MAR   Indocin [indomethacin] Other (See Comments)   Per MAR      Medication List    STOP taking these medications   ciprofloxacin 500 MG tablet Commonly known as:  CIPRO   metroNIDAZOLE 500 MG tablet Commonly known as:  FLAGYL     TAKE these medications  ARTIFICIAL TEARS 1.4 % ophthalmic solution Generic drug:  polyvinyl alcohol Place 1 drop into both eyes as needed.   ASPERCREME LIDOCAINE 4 % Ptch Generic drug:  Lidocaine Apply 1 patch topically daily. Leave on for 12 hours   benzocaine 10 % mucosal gel Commonly known as:  ORAJEL Use as directed 1 application in the mouth or throat as needed for mouth pain.   BREATHE RIGHT Strp Apply 1 strip topically at bedtime.   cholecalciferol 1000 units tablet Commonly known as:  VITAMIN D Take 2,000 Units by mouth daily.   finasteride 5 MG tablet Commonly known as:  PROSCAR Take 5 mg by mouth daily.   fluocinonide 0.05 % external solution Commonly known as:  LIDEX Apply 1 application topically as needed (for itching).   gabapentin 100 MG capsule Commonly known as:  NEURONTIN Take 100 mg by mouth daily with supper.   guaiFENesin 100 MG/5ML liquid Commonly  known as:  ROBITUSSIN Take 200 mg by mouth every 4 (four) hours as needed for cough or congestion.   ketoconazole 2 % shampoo Commonly known as:  NIZORAL Apply 1 application topically once a week.   levothyroxine 50 MCG tablet Commonly known as:  SYNTHROID, LEVOTHROID Take 50 mcg by mouth daily before breakfast.   losartan 25 MG tablet Commonly known as:  COZAAR Take 25 mg by mouth daily.   magic mouthwash w/lidocaine Soln Take 5-10 mLs by mouth every 4 (four) hours as needed for mouth pain.   NON FORMULARY Take 1 capsule by mouth as directed. Beneflex--may self-administer and keep at bedside   NON FORMULARY Take 1-1.5 oz by mouth daily as needed (upon requst). Alcoholic Beverage of Choice   omeprazole 20 MG capsule Commonly known as:  PRILOSEC Take 20 mg by mouth daily.   oxybutynin 5 MG 24 hr tablet Commonly known as:  DITROPAN-XL Take 5 mg by mouth daily.   oxyCODONE 5 MG immediate release tablet Commonly known as:  Oxy IR/ROXICODONE Take one tablet by mouth twice daily as needed for breakthrough pain What changed:    how much to take  how to take this  when to take this  reasons to take this  additional instructions   oxyCODONE 10 mg 12 hr tablet Commonly known as:  OXYCONTIN Take one tablet by mouth every morning for pain. Do not crush or chew. What changed:  Another medication with the same name was changed. Make sure you understand how and when to take each.   polyethylene glycol packet Commonly known as:  MIRALAX / GLYCOLAX Take 17 g by mouth daily.   saccharomyces boulardii 250 MG capsule Commonly known as:  FLORASTOR Take 250 mg by mouth 2 (two) times daily.   sennosides-docusate sodium 8.6-50 MG tablet Commonly known as:  SENOKOT-S Take 1 tablet by mouth every other day.   sennosides-docusate sodium 8.6-50 MG tablet Commonly known as:  SENOKOT-S Take 1 tablet by mouth daily as needed for constipation.   sodium chloride 0.65 % Soln nasal  spray Commonly known as:  OCEAN Place 1 spray into both nostrils 3 (three) times daily as needed for congestion.   triamcinolone 0.1 % paste Commonly known as:  KENALOG Use as directed 1 application in the mouth or throat as directed. 3-6 times daily      Follow-up Information    Oneal GroutPandey, Mahima, MD Follow up in 1 week(s).   Specialty:  Internal Medicine Contact information: [address] [place] [address] [phone]  Time coordinating discharge: 35 min  Signed:  Joseph Art   [facility] 04/08/2017, 9:05 AM

## 2017-04-09 NOTE — Consult Note (Signed)
           [facility]  04/09/2017  Ricardo GanserHerbert W Clopper 1913-10-07 [id number]   Went to see patient at the bedside to identify possible discharge needs but patient was already discharged per staff report.  Per MD note, patient was seen for intractable nausea and vomiting, right lower abdominal discomfort and constipation.  Per chart review, [facility] and discharged back to same yesterday.  Primary care provider's office is listed as providing transition of care (TOC).   For questions, please contact:  Wyatt HasteLorraine Green [place], BSN, RN- [facility] Primary [facility] Navigator  Telephone: [phone] [facility]

## 2017-04-13 ENCOUNTER — Ambulatory Visit: Payer: Medicare PPO | Admitting: Internal Medicine

## 2017-04-13 ENCOUNTER — Encounter: Payer: Self-pay | Admitting: Internal Medicine

## 2017-04-13 VITALS — BP 142/68 | HR 60 | Temp 98.2°F | Resp 16 | Ht 69.0 in | Wt 138.8 lb

## 2017-04-13 DIAGNOSIS — I714 Abdominal aortic aneurysm, without rupture, unspecified: Secondary | ICD-10-CM

## 2017-04-13 DIAGNOSIS — K5909 Other constipation: Secondary | ICD-10-CM | POA: Diagnosis not present

## 2017-04-13 DIAGNOSIS — N183 Chronic kidney disease, stage 3 unspecified: Secondary | ICD-10-CM

## 2017-04-13 DIAGNOSIS — E871 Hypo-osmolality and hyponatremia: Secondary | ICD-10-CM

## 2017-04-13 DIAGNOSIS — Z7189 Other specified counseling: Secondary | ICD-10-CM

## 2017-04-13 DIAGNOSIS — I10 Essential (primary) hypertension: Secondary | ICD-10-CM

## 2017-04-13 DIAGNOSIS — D638 Anemia in other chronic diseases classified elsewhere: Secondary | ICD-10-CM | POA: Diagnosis not present

## 2017-04-13 NOTE — Progress Notes (Signed)
[facility]  Provider: Oneal Grout MD   Location:      Place of Service:     PCP: Oneal Grout, MD Patient Care Team: Oneal Grout, MD as PCP - General (Internal Medicine) Ngetich, Donalee Citrin, NP as Nurse Practitioner (Family Medicine)  Extended Emergency Contact Information Primary Emergency Contact: [name], [place] Home Phone: [phone] Mobile Phone: [phone] Relation: Son Secondary Emergency Contact: [name] [place] Mobile Phone: [phone] Relation: Grandson  Code Status: DNR  Goals of Care: Advanced Directive information Advanced Directives 04/13/2017  Does Patient Have a Medical Advance Directive? Yes  Type of Estate agent of [place];Living will;Out of facility DNR (pink MOST or yellow form)  Does patient want to make changes to medical advance directive? Yes (MAU/Ambulatory/Procedural Areas - Information given)  Copy of Healthcare Power of Attorney in Chart? Yes  Pre-existing out of facility DNR order (yellow form or pink MOST form) Pink MOST form placed in chart (order not valid for inpatient use);Yellow form placed in chart (order not valid for inpatient use)      Chief Complaint  Patient presents with  . Hospitalization Follow-up    hospital follow up  . Medication Refill    No refills needed at this time.     HPI: Patient is a 84 y.o. male seen today for hospital follow up visit. He is here with his daughter in law. He was in the hospital from 04/05/17-04/08/17 with nausea and vomiting along with right lower abdominal discomfort and constipation. He underwent CTA chest/abdomen and pelvis showing 4.3 cm ascending thoracic aortic aneurysm and 8.4 cm abdominal aortic aneurysm larger in size compared to 6.7 cm in 2015. No evidence of leak or retroperitoneal hemorrhage. She was noted to have ileus. She ended up having bowel movement with  bowel regimen. Vascular surgery recommended conservative measure and palliative care consult if aneurysm ruptures.  There was concern for tachy/ brady syndrome and medical management was recommended. He has now returned to assisted living. He feels fine today. He denies abdominal pain this visit. Denies any nausea or vomiting. He has not moved his bowel today. He moves it every 2 days. Last bowel movement was 2 days ago. Patient is not having trouble with swallowing. He is taking regular consistency diet for now. Per family, no coughing episode noted with meals.   Past Medical History:  Diagnosis Date  . AAA (abdominal aortic aneurysm) (HCC)   . Abnormality of gait 08/01/2010  . Adhesive capsulitis of shoulder 08/01/2010   "Frozen" right shoulder  . Anemia   . Anxiety state, unspecified 08/01/2010  . Arthritis   . Congestive heart failure, unspecified 08/01/2010  . Cough 08/07/2015  . Disorders of bursae and tendons in shoulder region, unspecified 08/01/2010  . Edema 08/01/2010  . Fall   . Fecal impaction (HCC)   . GERD (gastroesophageal reflux disease)   . Hiatal hernia   . Hypercalcemia   . Hyperlipidemia   . Hypertension   . Hypertrophy of prostate without urinary obstruction and other lower urinary tract symptoms (LUTS) 08/01/2010  . Hyponatremia   . Hypothyroidism   . Infectious colitis   . Insomnia, unspecified 08/01/2010  . Joint pain   . Leg pain    with walking  . Lumbago 12/24/2010  . Orthostatic hypotension 11/05/2010  . Other specified cardiac dysrhythmias(427.89) 12/24/2010  . Pain in joint, pelvic  region and thigh 08/06/2010  . Peptic ulcer, unspecified site, unspecified as acute or chronic, without mention of hemorrhage, perforation, or obstruction 08/01/2010  . Renal failure, acute (HCC)   . Swelling, mass, or lump in head and neck 01/03/2011  . Ulcer    Stomach  . Unspecified disorder of kidney and ureter 08/01/2010  . Unspecified hearing loss 08/01/2010  . Unspecified  vitamin D deficiency 08/06/2010  . Urinary frequency 09/13/2010  . Vertigo    Past Surgical History:  Procedure Laterality Date  . ABDOMINAL AORTIC ANEURYSM REPAIR  12-18-10   Stent graft repair of AAA  . APPENDECTOMY    . CATARACT EXTRACTION    . HEMORRHOID SURGERY  2000  . INGUINAL HERNIA REPAIR Left 2005  . TRANSTHORACIC ECHOCARDIOGRAM  07/19/2010    Left ventricle: There is hypokinesis of the inferior wall and  posterior wall. The EF is 35-40%. The cavity size was normal    reports that  has never smoked. he has never used smokeless tobacco. He reports that he drinks alcohol. He reports that he does not use drugs. Social History   Socioeconomic History  . Marital status: Widowed    Spouse name: Not on file  . Number of children: Not on file  . Years of education: Not on file  . Highest education level: Not on file  Social Needs  . Financial resource strain: Not on file  . Food insecurity - worry: Not on file  . Food insecurity - inability: Not on file  . Transportation needs - medical: Not on file  . Transportation needs - non-medical: Not on file  Occupational History  . Occupation: previous Network engineer of [employer]  Tobacco Use  . Smoking status: Never Smoker  . Smokeless tobacco: Never Used  Substance and Sexual Activity  . Alcohol use: Yes    Alcohol/week: 0.0 oz    Comment: 1 1/2 oz daily   . Drug use: No  . Sexual activity: No  Other Topics Concern  . Not on file  Social History Narrative   [facility] admitted to [facility] 09/[facility]/10   Living Will, MOST, POA   Widowed   Exercises: no   Power wheelchair   Alcohol 1 1/2 oz daily   Never smoked              Family History  Problem Relation Age of Onset  . Heart failure Brother     Health Maintenance  Topic Date Due  . TETANUS/TDAP  11/01/2026  . INFLUENZA VACCINE  Completed  . PNA vac Low Risk Adult  Completed    Allergies  Allergen Reactions  . Aleve [Naproxen] Other (See  Comments)    Per MAR  . Indocin [Indomethacin] Other (See Comments)    Per [facility]    Outpatient Encounter Medications as of 04/13/2017  Medication Sig  . alum & mag hydroxide-simeth (MINTOX) 200-200-20 MG/5ML suspension Take 30 mLs by mouth every 4 (four) hours as needed for indigestion or heartburn.  . benzocaine (ORAJEL) 10 % mucosal gel Use as directed 1 application in the mouth or throat as needed for mouth pain.  . cholecalciferol (VITAMIN D) 1000 UNITS tablet Take 2,000 Units by mouth daily.   . feeding supplement (BOOST HIGH PROTEIN) LIQD Take 1 Container by mouth 2 (two) times daily between meals.  . finasteride (PROSCAR) 5 MG tablet Take 5 mg by mouth daily.   . fluocinonide (LIDEX) 0.05 % external solution Apply 1  application topically as needed (for itching).   . gabapentin (NEURONTIN) 100 MG capsule Take 100 mg by mouth daily with supper.   Marland Kitchen guaiFENesin (ROBITUSSIN) 100 MG/5ML liquid Take by mouth every 4 (four) hours as needed for cough or congestion (Give 5 mL).   . hydrocortisone 1 % lotion Apply 1 application topically 2 (two) times daily as needed for itching.  Marland Kitchen ketoconazole (NIZORAL) 2 % shampoo Apply 1 application topically once a week.  . levothyroxine (SYNTHROID, LEVOTHROID) 50 MCG tablet Take 50 mcg by mouth daily before breakfast.   . Lidocaine (ASPERCREME LIDOCAINE) 4 % PTCH Apply 1 patch topically daily. Leave on for 12 hours  . losartan (COZAAR) 25 MG tablet Take 25 mg by mouth daily.   . magic mouthwash w/lidocaine SOLN Take 5-10 mLs by mouth every 4 (four) hours as needed for mouth pain.  . Nasal Dilators (BREATHE RIGHT) STRP Apply 1 strip topically at bedtime.  Marland Kitchen omeprazole (PRILOSEC) 20 MG capsule Take 20 mg by mouth daily.  Marland Kitchen oxybutynin (DITROPAN-XL) 5 MG 24 hr tablet Take 5 mg by mouth daily.   Marland Kitchen oxyCODONE (OXY IR/ROXICODONE) 5 MG immediate release tablet Take one tablet by mouth twice daily as needed for breakthrough pain  . oxyCODONE (OXYCONTIN) 10 mg 12 hr  tablet Take one tablet by mouth every morning for pain. Do not crush or chew.  . polyethylene glycol (MIRALAX / GLYCOLAX) packet Take 17 g by mouth daily.  . polyvinyl alcohol (ARTIFICIAL TEARS) 1.4 % ophthalmic solution Place 1 drop into both eyes as needed.   . sennosides-docusate sodium (SENOKOT-S) 8.6-50 MG tablet Take 1 tablet by mouth every other day.   . sennosides-docusate sodium (SENOKOT-S) 8.6-50 MG tablet Take 1 tablet by mouth daily as needed for constipation.   . sodium chloride (OCEAN) 0.65 % SOLN nasal spray Place 1 spray into both nostrils 3 (three) times daily as needed for congestion.  . triamcinolone (KENALOG) 0.1 % paste Use as directed 1 application in the mouth or throat as directed. 3-6 times daily  . [DISCONTINUED] NON FORMULARY Take 1 capsule by mouth as directed. Beneflex--may self-administer and keep at bedside  . [DISCONTINUED] NON FORMULARY Take 1-1.5 oz by mouth daily as needed (upon requst). Alcoholic Beverage of Choice   No facility-administered encounter medications on file as of 04/13/2017.     Review of Systems  Constitutional: Negative for appetite change, chills and fever.       Energy level coming back  HENT: Positive for hearing loss. Negative for sore throat and trouble swallowing.   Respiratory: Negative for cough and shortness of breath.   Cardiovascular: Negative for chest pain, palpitations and leg swelling.  Gastrointestinal: Positive for constipation. Negative for abdominal pain, diarrhea, nausea and vomiting.  Genitourinary: Negative for dysuria.  Musculoskeletal: Positive for back pain and gait problem.  Skin: Negative for rash.  Neurological: Negative for dizziness and headaches.    Vitals:   04/13/17 1351  BP: (!) 142/68  Pulse: 60  Resp: 16  Temp: 98.2 F (36.8 C)  TempSrc: Oral  SpO2: 97%  Weight: 138 lb 12.8 oz (63 kg)  Height: 5\' 9"  (1.753 m)   Body mass index is 20.5 kg/m.   Wt Readings from Last 3 Encounters:  04/13/17  138 lb 12.8 oz (63 kg)  04/05/17 136 lb 0.4 oz (61.7 kg)  03/23/17 137 lb (62.1 kg)   Physical Exam  Constitutional: He is oriented to person, place, and time. He appears well-developed and well-nourished.  No distress.  HENT:  Head: Normocephalic and atraumatic.  Mouth/Throat: Oropharynx is clear and moist.  Very hard of hearing. Conversation written in paper and pt would read it and answer verbally.  Eyes: Conjunctivae and EOM are normal.  Neck: Neck supple.  Cardiovascular: Normal rate and regular rhythm.  Murmur heard. Pulmonary/Chest: Effort normal and breath sounds normal. He has no wheezes. He has no rales.  Abdominal: Soft. Bowel sounds are normal. There is no tenderness.  Musculoskeletal: He exhibits no edema.  Able to move all 4 extremities, unsteady gait  Lymphadenopathy:    He has no cervical adenopathy.  Neurological: He is alert and oriented to person, place, and time.  Skin: Skin is warm and dry. No rash noted. He is not diaphoretic.  Psychiatric: He has a normal mood and affect.    Labs reviewed: Basic Metabolic Panel: Recent Labs    04/05/17 1846 04/06/17 0821 04/07/17 0854  NA 137 136 134*  K 4.6 4.6 4.5  CL 101 106 104  CO2 24 21* 20*  GLUCOSE 163* 76 104*  BUN 24* 28* 26*  CREATININE 1.23 1.12 1.15  CALCIUM 9.3 8.5* 8.6*   Liver Function Tests: Recent Labs    12/11/16 04/05/17 1846 04/06/17 0821  AST 15  15 29 24   ALT 6*  6* 11* 11*  ALKPHOS 91  91 104 75  BILITOT  --  0.9 0.8  PROT  --  7.1 5.6*  ALBUMIN  --  3.8 3.0*   No results for input(s): LIPASE, AMYLASE in the last 8760 hours. No results for input(s): AMMONIA in the last 8760 hours. CBC: Recent Labs    04/05/17 1846 04/06/17 0821 04/07/17 0854  WBC 7.5 7.3 8.0  NEUTROABS 6.3  --   --   HGB 12.0* 10.3* 10.9*  HCT 36.6* 31.7* 34.1*  MCV 88.6 88.5 89.3  PLT 160 138* 153   Cardiac Enzymes: Recent Labs    04/06/17 0821 04/06/17 1430 04/06/17 2017  TROPONINI 0.14* 0.10*  0.09*   BNP: Invalid input(s): POCBNP Lab Results  Component Value Date   HGBA1C 5.6 04/06/2017   Lab Results  Component Value Date   TSH 0.936 04/06/2017   Lab Results  Component Value Date   VITAMINB12 690 04/21/2010   Lab Results  Component Value Date   FOLATE  04/21/2010    14.6 (NOTE)  Reference Ranges        Deficient:       0.4 - 3.3 ng/mL        Indeterminate:   3.4 - 5.4 ng/mL        Normal:              > 5.4 ng/mL   Lab Results  Component Value Date   IRON 12 (L) 04/21/2010   TIBC 182 (L) 04/21/2010   FERRITIN 153 04/21/2010    Lipid Panel: Recent Labs    04/06/17 0821  CHOL 145  HDL 47  LDLCALC 89  TRIG 47  CHOLHDL 3.1   Lab Results  Component Value Date   HGBA1C 5.6 04/06/2017    Procedures since last visit: Ct Angio Chest/abd/pel For Dissection W And/or Wo Contrast  Result Date: 04/05/2017 CLINICAL DATA:  Right lower quadrant pain and nausea beginning last night. Palpable abdominal mass. Previous abdominal aortic aneurysm repair and diverticulosis. EXAM: CT ANGIOGRAPHY CHEST, ABDOMEN AND PELVIS TECHNIQUE: Multidetector CT imaging through the chest, abdomen and pelvis was performed using the standard protocol during bolus administration of  intravenous contrast. Multiplanar reconstructed images and MIPs were obtained and reviewed to evaluate the vascular anatomy. CONTRAST:  ISOVUE-370 IOPAMIDOL (ISOVUE-370) INJECTION 76% COMPARISON:  AP CTA on 02/07/2014; no prior chest CTA FINDINGS: CTA CHEST FINDINGS Cardiovascular: Moderate cardiomegaly, without pericardial effusion. Aortic and coronary artery atherosclerosis. Aneurysm of the ascending aorta is seen measuring 4.3 cm. No evidence of aortic dissection. Mediastinum/Nodes: No masses or pathologically enlarged lymph nodes identified. Large hiatal hernia. Lungs/Pleura: No pulmonary mass, infiltrate, or effusion. Upper abdomen: No acute findings. Musculoskeletal: No suspicious bone lesions identified.  Review of the MIP images confirms the above findings. CTA ABDOMEN AND PELVIS FINDINGS VASCULAR Aorta: Aorto bi-iliac stent graft remains in place. The native abdominal aortic aneurysm is measures 8.4 cm in maximum diameter compared to 6.7 cm in 2015. No evidence of endoleak. No evidence of retroperitoneal hemorrhage. Celiac: Patent without evidence of aneurysm, dissection, vasculitis or significant stenosis. SMA: Patent without evidence of aneurysm, dissection, vasculitis or significant stenosis. Renals: Both renal arteries are patent without evidence of aneurysm, dissection, vasculitis, fibromuscular dysplasia or significant stenosis. IMA: Patent without evidence of aneurysm, dissection, vasculitis or significant stenosis. Inflow: Patent without evidence of aneurysm, dissection, vasculitis or significant stenosis. Veins: No obvious venous abnormality within the limitations of this arterial phase study. Review of the MIP images confirms the above findings. NON-VASCULAR Hepatobiliary: Suboptimal visualization due to beam hardening artifact from contrast bolus in the heart and aorta, but no definite hepatic abnormality identified. Small calcified gallstone noted, without evidence of cholecystitis. Pancreas:  No mass or inflammatory changes. Spleen: Within normal limits in size and appearance. Adrenals/Urinary Tract: No masses identified. No evidence of hydronephrosis. Stomach/Bowel: No evidence of obstruction, inflammatory process or abnormal fluid collections. Large hiatal hernia. Diffuse gaseous distention of the colon noted, without evidence of obstruction, highly suspicious for colonic ileus. Vascular/Lymphatic: No pathologically enlarged lymph nodes. No abdominal aortic aneurysm. Reproductive:  No mass or other significant abnormality. Other:  None. Musculoskeletal:  No suspicious bone lesions identified. Review of the MIP images confirms the above findings. IMPRESSION: 4.3 cm ascending thoracic aortic aneurysm.  No evidence of aortic dissection. Previous stent graft repair of abdominal aortic aneurysm. Native aneurysm sac now measures 8.4 cm in maximum diameter, compared to 6.7 cm in 2015. No evidence of endoleak or retroperitoneal hemorrhage. Stable large hiatal hernia. Probable colonic ileus. Cholelithiasis.  No radiographic evidence of cholecystitis. Electronically Signed   By: Myles Rosenthal M.D.   On: 04/05/2017 20:00    Assessment/Plan  1. AAA (abdominal aortic aneurysm), not a candidate for repair (HCC) >8 cm AAA, high risk for rupture, not a surgical candidate. Currently symptom free. BP controlled. If declines, goal is for comfort care with palliative care.   2. Goals of care, counseling/discussion Reviewed goals of care with patient, his HCPOA and myself present. Pt has living will and DNR paperwork. Has a MOST form but changes have been desired. Patient does not want any further hospitalization and would like to focus on comfort measures. He would like antibiotic if indicated for defined trial period and iv fluids if indicated for a trial period as well. He does not want feeding tube. Form has been reviewed and signed. Spent time from 2:25 pm- 3:05 pm reviewing goals of care. Patient and family are in same page.   3. Hyponatremia Monitor bmp, encouraged hydration  4. Anemia of chronic disease Check cbc  5. Essential hypertension Stable BP, continue losartan current dosing  6. Chronic kidney disease, stage III (moderate) (HCC) Monitor  renal function, hydration encouraged  7. Chronic constipation Change senokot s to 2 tab qhs from 1 tab qhs prn. Continue daily miralax, hydration encouraged. Goal is to avoid ileus.   Labs/tests ordered:  Cbc with diff, cmp  Next appointment: pt will be seen in his room in ALF  Communication: reviewed care plan with patient and his family.     Oneal GroutMAHIMA Kasmira Cacioppo, MD Internal Medicine [facility] [address] [place], [place] [address] Cell Phone (Monday-Friday 8 am - 5 pm): [phone] On Call: [phone] and follow prompts after 5 pm and on weekends Office Phone: [phone] Office Fax: [phone]

## 2017-04-13 NOTE — Patient Instructions (Signed)
  It was nice seeing you today. I will see you for your routine visit in your room.

## 2017-04-14 ENCOUNTER — Encounter: Payer: Self-pay | Admitting: Internal Medicine

## 2017-04-16 ENCOUNTER — Encounter: Payer: Self-pay | Admitting: *Deleted

## 2017-04-16 DIAGNOSIS — E785 Hyperlipidemia, unspecified: Secondary | ICD-10-CM | POA: Diagnosis not present

## 2017-04-16 DIAGNOSIS — I509 Heart failure, unspecified: Secondary | ICD-10-CM | POA: Diagnosis not present

## 2017-04-16 DIAGNOSIS — I719 Aortic aneurysm of unspecified site, without rupture: Secondary | ICD-10-CM | POA: Diagnosis not present

## 2017-04-16 DIAGNOSIS — E039 Hypothyroidism, unspecified: Secondary | ICD-10-CM | POA: Diagnosis not present

## 2017-04-16 DIAGNOSIS — I10 Essential (primary) hypertension: Secondary | ICD-10-CM | POA: Diagnosis not present

## 2017-04-16 LAB — COMPLETE METABOLIC PANEL WITH GFR
ALK PHOS: 87
ALT: 7
AST: 16
Albumin: 3.6
BILIRUBIN TOTAL: 0.6
BUN: 31 — AB (ref 4–21)
CALCIUM: 8.8
CREATININE: 1.14
Glucose: 81
Potassium: 4.8
Sodium: 138
Total Protein: 6.1 g/dL

## 2017-04-16 LAB — CBC
HCT: 29.8
HEMOGLOBIN: 10.1
PLATELET COUNT: 172
WBC: 6

## 2017-04-23 ENCOUNTER — Inpatient Hospital Stay (HOSPITAL_COMMUNITY)
Admission: EM | Admit: 2017-04-23 | Discharge: 2017-04-25 | DRG: 390 | Disposition: A | Discharge status: Home / Self Care | Payer: Medicare PPO | Attending: Internal Medicine | Admitting: Internal Medicine

## 2017-04-23 ENCOUNTER — Encounter (HOSPITAL_COMMUNITY): Payer: Self-pay | Admitting: Emergency Medicine

## 2017-04-23 ENCOUNTER — Other Ambulatory Visit: Payer: Self-pay

## 2017-04-23 ENCOUNTER — Emergency Department (HOSPITAL_COMMUNITY): Payer: Medicare PPO

## 2017-04-23 DIAGNOSIS — E039 Hypothyroidism, unspecified: Secondary | ICD-10-CM | POA: Diagnosis present

## 2017-04-23 DIAGNOSIS — Z66 Do not resuscitate: Secondary | ICD-10-CM | POA: Diagnosis present

## 2017-04-23 DIAGNOSIS — E559 Vitamin D deficiency, unspecified: Secondary | ICD-10-CM | POA: Diagnosis present

## 2017-04-23 DIAGNOSIS — K2960 Other gastritis without bleeding: Secondary | ICD-10-CM

## 2017-04-23 DIAGNOSIS — N400 Enlarged prostate without lower urinary tract symptoms: Secondary | ICD-10-CM | POA: Diagnosis present

## 2017-04-23 DIAGNOSIS — R100 Acute abdomen: Secondary | ICD-10-CM | POA: Diagnosis not present

## 2017-04-23 DIAGNOSIS — I712 Thoracic aortic aneurysm, without rupture: Secondary | ICD-10-CM | POA: Diagnosis present

## 2017-04-23 DIAGNOSIS — R109 Unspecified abdominal pain: Secondary | ICD-10-CM | POA: Diagnosis not present

## 2017-04-23 DIAGNOSIS — Z79891 Long term (current) use of opiate analgesic: Secondary | ICD-10-CM

## 2017-04-23 DIAGNOSIS — I440 Atrioventricular block, first degree: Secondary | ICD-10-CM | POA: Diagnosis present

## 2017-04-23 DIAGNOSIS — G8929 Other chronic pain: Secondary | ICD-10-CM | POA: Diagnosis present

## 2017-04-23 DIAGNOSIS — K1379 Other lesions of oral mucosa: Secondary | ICD-10-CM | POA: Diagnosis present

## 2017-04-23 DIAGNOSIS — K5649 Other impaction of intestine: Secondary | ICD-10-CM | POA: Diagnosis not present

## 2017-04-23 DIAGNOSIS — R0981 Nasal congestion: Secondary | ICD-10-CM | POA: Diagnosis present

## 2017-04-23 DIAGNOSIS — K56691 Other complete intestinal obstruction: Secondary | ICD-10-CM | POA: Diagnosis not present

## 2017-04-23 DIAGNOSIS — K219 Gastro-esophageal reflux disease without esophagitis: Secondary | ICD-10-CM | POA: Diagnosis present

## 2017-04-23 DIAGNOSIS — K56699 Other intestinal obstruction unspecified as to partial versus complete obstruction: Secondary | ICD-10-CM | POA: Diagnosis not present

## 2017-04-23 DIAGNOSIS — I714 Abdominal aortic aneurysm, without rupture, unspecified: Secondary | ICD-10-CM

## 2017-04-23 DIAGNOSIS — I10 Essential (primary) hypertension: Secondary | ICD-10-CM | POA: Diagnosis present

## 2017-04-23 DIAGNOSIS — H9190 Unspecified hearing loss, unspecified ear: Secondary | ICD-10-CM | POA: Diagnosis present

## 2017-04-23 DIAGNOSIS — Z8249 Family history of ischemic heart disease and other diseases of the circulatory system: Secondary | ICD-10-CM | POA: Diagnosis not present

## 2017-04-23 DIAGNOSIS — K56609 Unspecified intestinal obstruction, unspecified as to partial versus complete obstruction: Secondary | ICD-10-CM | POA: Diagnosis present

## 2017-04-23 DIAGNOSIS — N319 Neuromuscular dysfunction of bladder, unspecified: Secondary | ICD-10-CM | POA: Diagnosis present

## 2017-04-23 DIAGNOSIS — R112 Nausea with vomiting, unspecified: Secondary | ICD-10-CM | POA: Diagnosis not present

## 2017-04-23 DIAGNOSIS — R1031 Right lower quadrant pain: Secondary | ICD-10-CM | POA: Diagnosis not present

## 2017-04-23 DIAGNOSIS — R1110 Vomiting, unspecified: Secondary | ICD-10-CM | POA: Diagnosis not present

## 2017-04-23 LAB — URINALYSIS, ROUTINE W REFLEX MICROSCOPIC
BILIRUBIN URINE: NEGATIVE
Bacteria, UA: NONE SEEN
GLUCOSE, UA: NEGATIVE mg/dL
Ketones, ur: 5 mg/dL — AB
Leukocytes, UA: NEGATIVE
Nitrite: NEGATIVE
PROTEIN: 100 mg/dL — AB
Specific Gravity, Urine: 1.011 (ref 1.005–1.030)
pH: 6 (ref 5.0–8.0)

## 2017-04-23 LAB — CBC WITH DIFFERENTIAL/PLATELET
Basophils Absolute: 0 10*3/uL (ref 0.0–0.1)
Basophils Relative: 0 %
EOS ABS: 0 10*3/uL (ref 0.0–0.7)
Eosinophils Relative: 0 %
HEMATOCRIT: 35.1 % — AB (ref 39.0–52.0)
Hemoglobin: 11.4 g/dL — ABNORMAL LOW (ref 13.0–17.0)
LYMPHS ABS: 0.4 10*3/uL — AB (ref 0.7–4.0)
Lymphocytes Relative: 5 %
MCH: 28.9 pg (ref 26.0–34.0)
MCHC: 32.5 g/dL (ref 30.0–36.0)
MCV: 88.9 fL (ref 78.0–100.0)
MONO ABS: 0.7 10*3/uL (ref 0.1–1.0)
MONOS PCT: 9 %
Neutro Abs: 7 10*3/uL (ref 1.7–7.7)
Neutrophils Relative %: 86 %
Platelets: 147 10*3/uL — ABNORMAL LOW (ref 150–400)
RBC: 3.95 MIL/uL — ABNORMAL LOW (ref 4.22–5.81)
RDW: 13.5 % (ref 11.5–15.5)
WBC: 8.2 10*3/uL (ref 4.0–10.5)

## 2017-04-23 LAB — COMPREHENSIVE METABOLIC PANEL
ALBUMIN: 3.8 g/dL (ref 3.5–5.0)
ALK PHOS: 105 U/L (ref 38–126)
ALT: 11 U/L — AB (ref 17–63)
AST: 23 U/L (ref 15–41)
Anion gap: 11 (ref 5–15)
BUN: 23 mg/dL — ABNORMAL HIGH (ref 6–20)
CALCIUM: 9.1 mg/dL (ref 8.9–10.3)
CHLORIDE: 103 mmol/L (ref 101–111)
CO2: 23 mmol/L (ref 22–32)
CREATININE: 1.15 mg/dL (ref 0.61–1.24)
GFR calc non Af Amer: 50 mL/min — ABNORMAL LOW (ref >60)
GFR, EST AFRICAN AMERICAN: 58 mL/min — AB (ref >60)
GLUCOSE: 135 mg/dL — AB (ref 65–99)
Potassium: 4.3 mmol/L (ref 3.5–5.1)
Sodium: 137 mmol/L (ref 135–145)
Total Bilirubin: 1.1 mg/dL (ref 0.3–1.2)
Total Protein: 7 g/dL (ref 6.5–8.1)

## 2017-04-23 LAB — I-STAT CG4 LACTIC ACID, ED: LACTIC ACID, VENOUS: 0.98 mmol/L (ref 0.5–1.9)

## 2017-04-23 MED ORDER — HEPARIN SODIUM (PORCINE) 5000 UNIT/ML IJ SOLN
5000.0000 [IU] | Freq: Three times a day (TID) | INTRAMUSCULAR | Status: DC
  Administered 2017-04-23 – 2017-04-25 (×5): 5000 [IU] via SUBCUTANEOUS
  Filled 2017-04-23 (×5): qty 1

## 2017-04-23 MED ORDER — FENTANYL CITRATE (PF) 100 MCG/2ML IJ SOLN
75.0000 ug | Freq: Once | INTRAMUSCULAR | Status: AC
  Administered 2017-04-23: 75 ug via INTRAVENOUS
  Filled 2017-04-23: qty 2

## 2017-04-23 MED ORDER — IOPAMIDOL (ISOVUE-300) INJECTION 61%
INTRAVENOUS | Status: AC
  Administered 2017-04-23: 100 mL
  Filled 2017-04-23: qty 100

## 2017-04-23 MED ORDER — PANTOPRAZOLE SODIUM 40 MG IV SOLR
40.0000 mg | Freq: Every day | INTRAVENOUS | Status: DC
  Administered 2017-04-23 – 2017-04-24 (×2): 40 mg via INTRAVENOUS
  Filled 2017-04-23 (×2): qty 40

## 2017-04-23 MED ORDER — HYDRALAZINE HCL 20 MG/ML IJ SOLN: 10.0000 mg | Freq: Three times a day (TID) | INTRAMUSCULAR | Status: DC | PRN

## 2017-04-23 MED ORDER — ACETAMINOPHEN 325 MG PO TABS
650.0000 mg | ORAL_TABLET | Freq: Four times a day (QID) | ORAL | Status: DC | PRN
  Filled 2017-04-23: qty 2

## 2017-04-23 MED ORDER — ONDANSETRON HCL 4 MG PO TABS: 4.0000 mg | ORAL_TABLET | Freq: Four times a day (QID) | ORAL | Status: DC | PRN

## 2017-04-23 MED ORDER — FENTANYL CITRATE (PF) 100 MCG/2ML IJ SOLN
50.0000 ug | INTRAMUSCULAR | Status: DC | PRN
  Administered 2017-04-23: 50 ug via INTRAVENOUS
  Filled 2017-04-23: qty 2

## 2017-04-23 MED ORDER — DEXTROSE-NACL 5-0.45 % IV SOLN
INTRAVENOUS | Status: DC
  Administered 2017-04-23 – 2017-04-25 (×4): via INTRAVENOUS

## 2017-04-23 MED ORDER — BREATHE RIGHT STRP: 1.0000 | ORAL_STRIP | Freq: Every day | Status: DC

## 2017-04-23 MED ORDER — FINASTERIDE 5 MG PO TABS
5.0000 mg | ORAL_TABLET | Freq: Every day | ORAL | Status: DC
Start: 2017-04-24 — End: 2017-04-25
  Administered 2017-04-24 – 2017-04-25 (×2): 5 mg via ORAL
  Filled 2017-04-23 (×2): qty 1

## 2017-04-23 MED ORDER — LIDOCAINE 4 % EX PTCH: 1.0000 | MEDICATED_PATCH | Freq: Every day | CUTANEOUS | Status: DC

## 2017-04-23 MED ORDER — BENZOCAINE 10 % MT GEL
1.0000 | OROMUCOSAL | Status: DC | PRN
Start: 2017-04-23 — End: 2017-04-25

## 2017-04-23 MED ORDER — LIDOCAINE 5 % EX PTCH
1.0000 | MEDICATED_PATCH | CUTANEOUS | Status: DC
  Administered 2017-04-24 – 2017-04-25 (×2): 1 via TRANSDERMAL
  Filled 2017-04-23 (×2): qty 1

## 2017-04-23 MED ORDER — ONDANSETRON HCL 4 MG/2ML IJ SOLN: 4.0000 mg | Freq: Four times a day (QID) | INTRAMUSCULAR | Status: DC | PRN

## 2017-04-23 MED ORDER — LEVOTHYROXINE SODIUM 50 MCG PO TABS
50.0000 ug | ORAL_TABLET | Freq: Every day | ORAL | Status: DC
  Administered 2017-04-24 – 2017-04-25 (×2): 50 ug via ORAL
  Filled 2017-04-23 (×3): qty 1

## 2017-04-23 MED ORDER — SALINE SPRAY 0.65 % NA SOLN: 1.0000 | Freq: Three times a day (TID) | NASAL | Status: DC | PRN

## 2017-04-23 MED ORDER — ACETAMINOPHEN 650 MG RE SUPP: 650.0000 mg | Freq: Four times a day (QID) | RECTAL | Status: DC | PRN

## 2017-04-23 NOTE — ED Notes (Signed)
Patient transported to CT 

## 2017-04-23 NOTE — ED Provider Notes (Signed)
[facility] EMERGENCY DEPARTMENT Provider Note   CSN: [id number] Arrival date & time: 04/23/17  1045     History   Chief Complaint Chief Complaint  Patient presents with  . Abdominal Pain    HPI Ricardo [name] is a 84 y.o. male.  The history is provided by the patient and a relative. No language interpreter was used.  Abdominal Pain      Ricardo [name] is a 84 y.o. male who presents to the Emergency Department complaining of abdominal pain.  History of aortic aneurysm and comes in today with severe abdominal pain since 8 AM.  Pain is in the right lower quadrant radiating to his back.  His worst pain is ever experienced.  He also has associated nausea and vomiting.  He was recently admitted to the hospital for abdominal pain with worsening aortic aneurysm.  He does have a most form in place.  Past Medical History:  Diagnosis Date  . AAA (abdominal aortic aneurysm) (HCC)   . Abnormality of gait 08/01/2010  . Adhesive capsulitis of shoulder 08/01/2010   "Frozen" right shoulder  . Anemia   . Anxiety state, unspecified 08/01/2010  . Arthritis   . Congestive heart failure, unspecified 08/01/2010  . Cough 08/07/2015  . Disorders of bursae and tendons in shoulder region, unspecified 08/01/2010  . Edema 08/01/2010  . Fall   . Fecal impaction (HCC)   . GERD (gastroesophageal reflux disease)   . Hiatal hernia   . Hypercalcemia   . Hyperlipidemia   . Hypertension   . Hypertrophy of prostate without urinary obstruction and other lower urinary tract symptoms (LUTS) 08/01/2010  . Hyponatremia   . Hypothyroidism   . Infectious colitis   . Insomnia, unspecified 08/01/2010  . Joint pain   . Leg pain    with walking  . Lumbago 12/24/2010  . Orthostatic hypotension 11/05/2010  . Other specified cardiac dysrhythmias(427.89) 12/24/2010  . Pain in joint, pelvic region and thigh 08/06/2010  . Peptic ulcer, unspecified site, unspecified as acute or chronic, without  mention of hemorrhage, perforation, or obstruction 08/01/2010  . Renal failure, acute (HCC)   . Swelling, mass, or lump in head and neck 01/03/2011  . Ulcer    Stomach  . Unspecified disorder of kidney and ureter 08/01/2010  . Unspecified hearing loss 08/01/2010  . Unspecified vitamin D deficiency 08/06/2010  . Urinary frequency 09/13/2010  . Vertigo     Patient Active Problem List   Diagnosis Date Noted  . Protein-calorie malnutrition, severe 04/07/2017  . Nausea and vomiting 04/07/2017  . Nausea & vomiting 04/05/2017  . Tachycardia 04/05/2017  . Neuropathic pain 12/08/2016  . Anemia of chronic disease 12/08/2016  . DNR (do not resuscitate)   . Cough 08/07/2015  . Chronic mucus hypersecretion, respiratory 04/10/2015  . Xerostomia 03/27/2015  . Edema 03/27/2015  . Bradycardia by electrocardiogram 09/22/2014  . Spinal stenosis in cervical region 09/06/2013  . Deaf 06/21/2013  . Lumbago 06/21/2013  . Constipation 03/08/2013  . Hypothyroidism 03/08/2013  . BPH (benign prostatic hyperplasia) 03/08/2013  . Depression with anxiety 03/08/2013  . Fall at nursing home 03/08/2013  . AAA (abdominal aortic aneurysm) (HCC) 01/27/2012  . Abnormality of gait 08/01/2010  . Osteoarthritis 05/17/2009  . GERD 03/16/2009  . ULCER-GASTRIC 03/16/2009  . DIVERTICULOSIS-COLON 03/16/2009  . Dyslipidemia 01/30/2009  . Iron deficiency anemia 01/30/2009  . Essential hypertension 01/30/2009  . VERTIGO 01/30/2009  . Chronic kidney disease, stage III (moderate) (HCC) 01/30/2009  Past Surgical History:  Procedure Laterality Date  . ABDOMINAL AORTIC ANEURYSM REPAIR  12-18-10   Stent graft repair of AAA  . APPENDECTOMY    . CATARACT EXTRACTION    . HEMORRHOID SURGERY  2000  . INGUINAL HERNIA REPAIR Left 2005  . TRANSTHORACIC ECHOCARDIOGRAM  07/19/2010    Left ventricle: There is hypokinesis of the inferior wall and  posterior wall. The EF is 35-40%. The cavity size was normal       Home  Medications    Prior to Admission medications   Medication Sig Start Date End Date Taking? Authorizing Provider  alum & mag hydroxide-simeth (MINTOX) 200-200-20 MG/5ML suspension Take 30 mLs by mouth every 4 (four) hours as needed for indigestion or heartburn.    [provider]  benzocaine (ORAJEL) 10 % mucosal gel Use as directed 1 application in the mouth or throat as needed for mouth pain.    [provider]  cholecalciferol (VITAMIN D) 1000 UNITS tablet Take 2,000 Units by mouth daily.     [provider]  feeding supplement (BOOST HIGH PROTEIN) LIQD Take 1 Container by mouth 2 (two) times daily between meals.    [provider]  finasteride (PROSCAR) 5 MG tablet Take 5 mg by mouth daily.  01/25/13   [provider]  fluocinonide (LIDEX) 0.05 % external solution Apply 1 application topically as needed (for itching).     [provider]  gabapentin (NEURONTIN) 100 MG capsule Take 100 mg by mouth daily with supper.     [provider]  guaiFENesin (ROBITUSSIN) 100 MG/5ML liquid Take by mouth every 4 (four) hours as needed for cough or congestion (Give 5 mL).     [provider]  hydrocortisone 1 % lotion Apply 1 application topically 2 (two) times daily as needed for itching.    [provider]  ketoconazole (NIZORAL) 2 % shampoo Apply 1 application topically once a week.    [provider]  levothyroxine (SYNTHROID, LEVOTHROID) 50 MCG tablet Take 50 mcg by mouth daily before breakfast.  01/10/13   [provider]  Lidocaine (ASPERCREME LIDOCAINE) 4 % PTCH Apply 1 patch topically daily. Leave on for 12 hours    [provider]  losartan (COZAAR) 25 MG tablet Take 25 mg by mouth daily.  12/31/12   [provider]  magic mouthwash w/lidocaine SOLN Take 5-10 mLs by mouth every 4 (four) hours as needed for mouth pain.    [provider]  Nasal Dilators (BREATHE RIGHT) STRP  Apply 1 strip topically at bedtime.    [provider]  omeprazole (PRILOSEC) 20 MG capsule Take 20 mg by mouth daily.    [provider]  oxybutynin (DITROPAN-XL) 5 MG 24 hr tablet Take 5 mg by mouth daily.     [provider]  oxyCODONE (OXY IR/ROXICODONE) 5 MG immediate release tablet Take one tablet by mouth twice daily as needed for breakthrough pain 11/03/16   Ngetich, Dinah C, NP  oxyCODONE (OXYCONTIN) 10 mg 12 hr tablet Take one tablet by mouth every morning for pain. Do not crush or chew. 01/23/17   Ngetich, Dinah C, NP  polyethylene glycol (MIRALAX / GLYCOLAX) packet Take 17 g by mouth daily.    [provider]  polyvinyl alcohol (ARTIFICIAL TEARS) 1.4 % ophthalmic solution Place 1 drop into both eyes as needed.     [provider]  sennosides-docusate sodium (SENOKOT-S) 8.6-50 MG tablet Take 1 tablet by mouth every other  day.     [provider]  sennosides-docusate sodium (SENOKOT-S) 8.6-50 MG tablet Take 1 tablet by mouth daily as needed for constipation.     [provider]  sodium chloride (OCEAN) 0.65 % SOLN nasal spray Place 1 spray into both nostrils 3 (three) times daily as needed for congestion.    [provider]  triamcinolone (KENALOG) 0.1 % paste Use as directed 1 application in the mouth or throat as directed. 3-6 times daily    [provider]    Family History Family History  Problem Relation Age of Onset  . Heart failure Brother     Social History Social History   Tobacco Use  . Smoking status: Never Smoker  . Smokeless tobacco: Never Used  Substance Use Topics  . Alcohol use: Yes    Alcohol/week: 0.0 oz    Comment: 1 1/2 oz daily   . Drug use: No     Allergies   Aleve [naproxen] and Indocin [indomethacin]   Review of Systems Review of Systems  Gastrointestinal: Positive for abdominal pain.  All other systems reviewed and are negative.    Physical Exam Updated Vital  Signs BP (!) 164/79   Pulse 76   Resp 15   SpO2 97%   Physical Exam  Constitutional: He is oriented to person, place, and time. He appears well-developed and well-nourished.  HENT:  Head: Normocephalic and atraumatic.  Cardiovascular: Normal rate and regular rhythm.  No murmur heard. Pulmonary/Chest: Effort normal and breath sounds normal. No respiratory distress.  Abdominal: Soft. There is no rebound and no guarding.  Large pulsating abdominal mass in the right lower quadrant that is tender to palpation  Musculoskeletal: He exhibits no edema or tenderness.  Neurological: He is alert and oriented to person, place, and time.  Very hard of hearing  Skin: Skin is warm and dry.  Psychiatric: He has a normal mood and affect. His behavior is normal.  Nursing note and vitals reviewed.    ED Treatments / Results  Labs (all labs ordered are listed, but only abnormal results are displayed) Labs Reviewed  COMPREHENSIVE METABOLIC PANEL - Abnormal; Notable for the following components:      Result Value   Glucose, Bld 135 (*)    BUN 23 (*)    ALT 11 (*)    GFR calc non Af Amer 50 (*)    GFR calc Af Amer 58 (*)    All other components within normal limits  CBC WITH DIFFERENTIAL/PLATELET - Abnormal; Notable for the following components:   RBC 3.95 (*)    Hemoglobin 11.4 (*)    HCT 35.1 (*)    Platelets 147 (*)    Lymphs Abs 0.4 (*)    All other components within normal limits  URINALYSIS, ROUTINE W REFLEX MICROSCOPIC - Abnormal; Notable for the following components:   Hgb urine dipstick SMALL (*)    Ketones, ur 5 (*)    Protein, ur 100 (*)    Squamous Epithelial / LPF 0-5 (*)    All other components within normal limits  I-STAT CG4 LACTIC ACID, ED  I-STAT CG4 LACTIC ACID, ED    EKG  EKG Interpretation  Date/Time:  Thursday April 23 2017 11:05:31 EST Ventricular Rate:  63 PR Interval:    QRS Duration: 145 QT Interval:  448 QTC Calculation: 459 R Axis:   -54 Text  Interpretation:  Sinus rhythm Atrial premature complex Prolonged PR interval Nonspecific IVCD with LAD LVH with secondary repolarization  abnormality Anterior Q waves, possibly due to LVH Confirmed by Tilden Fossa [phone]) on 04/23/2017 12:50:22 PM       Radiology Ct Abdomen Pelvis W Contrast  Result Date: 04/23/2017 CLINICAL DATA:  Abdominal pain and vomiting. EXAM: CT ABDOMEN AND PELVIS WITH CONTRAST TECHNIQUE: Multidetector CT imaging of the abdomen and pelvis was performed using the standard protocol following bolus administration of intravenous contrast. CONTRAST:  ISOVUE-300 IOPAMIDOL (ISOVUE-300) INJECTION 61% COMPARISON:  CT abdomen pelvis dated February 07, 2014. FINDINGS: Lower chest: Borderline aneurysmal ascending thoracic aorta, measuring 4.0 cm. Enlargement of the right main pulmonary artery, measuring up to 3.6 cm. Bibasilar atelectasis. Hepatobiliary: No focal liver abnormality. Cholelithiasis. No gallbladder wall thickening or biliary dilatation. Pancreas: Mild parenchymal atrophy. Prominence of the main pancreatic duct, measuring up to 4 mm proximally. Spleen: Normal in size without focal abnormality. Adrenals/Urinary Tract: Adrenal glands are unremarkable. Kidneys are normal, without renal calculi, focal lesion, or hydronephrosis. Bladder is mildly distended. Stomach/Bowel: Large hiatal hernia. Multiple dilated loops of proximal to mid small bowel, with a transition point seen in the right anterior abdomen (series 3, image 50). Borderline dilated right and transverse colon containing a large amount of stool. The appendix is not visualized. Vascular/Lymphatic: Tortuous, atherosclerotic abdominal aorta, with large infrarenal abdominal aortic aneurysm status post stent graft. The excluded aneurysm sac as enlarged in size, now measuring 8.4 cm, previously 6.8 cm. Contrast within the excluded aneurysmal sac, consistent with type 2 endoleak, unchanged. No lymphadenopathy. Reproductive:  Prostate is unremarkable. Other: No free fluid or pneumoperitoneum. Musculoskeletal: No acute or significant osseous findings. IMPRESSION: 1. Small bowel obstruction with transition point seen in the right anterior abdomen. 2. Patent bifurcated abdominal aortic stent graft with persistent type 2 endoleak and continued enlargement of the infrarenal abdominal aortic aneurysm, now measuring 8.4 cm, previously 6.8 cm. Vascular surgery consultation recommended due to increased risk of rupture for AAA >5.5 cm. This recommendation follows ACR consensus guidelines: White Paper of the ACR Incidental Findings Committee II on Vascular Findings. J Am Coll Radiol 2013; 10:789-794. 3. Borderline aneurysmal ascending thoracic aorta, measuring 4.0 cm. Recommend annual imaging followup by CTA or MRA. This recommendation follows 2010 ACCF/AHA/AATS/ACR/ASA/SCA/SCAI/SIR/STS/SVM Guidelines for the Diagnosis and Management of Patients with Thoracic Aortic Disease. Circulation. 2010; 121: O536-U440 4. Cholelithiasis. Electronically Signed   By: Obie Dredge M.D.   On: 04/23/2017 14:49    Procedures Procedures (including critical care time) EMERGENCY DEPARTMENT ULTRASOUND  Study: Limited Retroperitoneal Ultrasound of the Abdominal Aorta.  INDICATIONS:Pulsatile abdominal mass Multiple views of the abdominal aorta were obtained in real-time from the diaphragmatic hiatus to the aortic bifurcation in transverse planes with a multi-frequency probe.  PERFORMED BY: Myself IMAGES ARCHIVED?: Yes LIMITATIONS:  Bowel gas INTERPRETATION:  Abdominal aortic aneurysm present - diameter dimensions 8.1    Medications Ordered in ED Medications  dextrose 5 %-0.45 % sodium chloride infusion (not administered)  iopamidol (ISOVUE-300) 61 % injection (100 mLs  Contrast Given 04/23/17 1415)  fentaNYL (SUBLIMAZE) injection 75 mcg (75 mcg Intravenous Given 04/23/17 1436)     Initial Impression / Assessment and Plan / ED Course  I have  reviewed the triage vital signs and the nursing notes.  Pertinent labs & imaging results that were available during my care of the patient were reviewed by me and considered in my medical decision making (see chart for details).     Pt with known history of AAA that is inoperable here for evaluation of worsening lower abdominal pain with associated vomiting.  Patient and family states this is the most severe his pain has ever experienced.  CT abdomen does demonstrate persistent aortic aneurysm and also evidence of acute bowel obstruction.  Given patient's symptoms medicine consulted for symptom control and admission.  Patient is not a surgical candidate, he has a DNR form at the bedside.  Final Clinical Impressions(s) / ED Diagnoses   Final diagnoses:  Other complete intestinal obstruction (HCC)  Abdominal aortic aneurysm (AAA) without rupture [facility])    ED Discharge Orders    None       Tilden Fossa, MD 04/23/17 1554

## 2017-04-23 NOTE — ED Triage Notes (Signed)
[facility] c/o medial abd pain, states, "It's the worst pain I've ever had."  EMS reports giving 4mg  Zofran for nausea, reports pt is DNR with MOST at bedside. Pt AOx4 on arrival, very hard of hearing.

## 2017-04-23 NOTE — H&P (Addendum)
[facility] History and Physical  Ricardo [name] ZOX:[id number] DOB: January 27, 1914 DOA: 04/23/2017  Referring physician:  PCP: Oneal Grout, MD   Chief Complaint: "My stomach has been hurting me."  HPI: Ricardo [name] is a 84 y.o. male with past medical history significant for AAA, recent ileus, congestive heart failure, fecal impaction, hypertension, hyponatremia and colitis presents to the emergency room with extreme abdominal pain.  Patient extreme abdominal pain around 8 AM this morning.  Pain radiated to the patient's back.  Patient family concerned the patient was having dissection of his AAA.  Patient was brought to the emergency room for evaluation.  Patient has had no suspicious ingestions or significant medication change recently.  No known sick contacts or GI symptoms.  ED course: EDP ordered imaging of the abdomen and pelvis.  No dissection seen but small bowel obstruction seen.  Patient family opted not to have NG tube placed.  Fluid started.  Hospitalist consulted for admission.   Review of Systems:  As per HPI otherwise 10 point review of systems negative.    Past Medical History:  Diagnosis Date  . AAA (abdominal aortic aneurysm) (HCC)   . Abnormality of gait 08/01/2010  . Adhesive capsulitis of shoulder 08/01/2010   "Frozen" right shoulder  . Anemia   . Anxiety state, unspecified 08/01/2010  . Arthritis   . Congestive heart failure, unspecified 08/01/2010  . Cough 08/07/2015  . Disorders of bursae and tendons in shoulder region, unspecified 08/01/2010  . Edema 08/01/2010  . Fall   . Fecal impaction (HCC)   . GERD (gastroesophageal reflux disease)   . Hiatal hernia   . Hypercalcemia   . Hyperlipidemia   . Hypertension   . Hypertrophy of prostate without urinary obstruction and other lower urinary tract symptoms (LUTS) 08/01/2010  . Hyponatremia   . Hypothyroidism   . Infectious colitis   . Insomnia, unspecified 08/01/2010  . Joint pain   . Leg pain    with walking  . Lumbago 12/24/2010  . Orthostatic hypotension 11/05/2010  . Other specified cardiac dysrhythmias(427.89) 12/24/2010  . Pain in joint, pelvic region and thigh 08/06/2010  . Peptic ulcer, unspecified site, unspecified as acute or chronic, without mention of hemorrhage, perforation, or obstruction 08/01/2010  . Renal failure, acute (HCC)   . Swelling, mass, or lump in head and neck 01/03/2011  . Ulcer    Stomach  . Unspecified disorder of kidney and ureter 08/01/2010  . Unspecified hearing loss 08/01/2010  . Unspecified vitamin D deficiency 08/06/2010  . Urinary frequency 09/13/2010  . Vertigo    Past Surgical History:  Procedure Laterality Date  . ABDOMINAL AORTIC ANEURYSM REPAIR  12-18-10   Stent graft repair of AAA  . APPENDECTOMY    . CATARACT EXTRACTION    . HEMORRHOID SURGERY  2000  . INGUINAL HERNIA REPAIR Left 2005  . TRANSTHORACIC ECHOCARDIOGRAM  07/19/2010    Left ventricle: There is hypokinesis of the inferior wall and  posterior wall. The EF is 35-40%. The cavity size was normal   Social History:  reports that  has never smoked. he has never used smokeless tobacco. He reports that he drinks alcohol. He reports that he does not use drugs.  Allergies  Allergen Reactions  . Aleve [Naproxen] Other (See Comments)    Per MAR  . Indocin [Indomethacin] Other (See Comments)    Per MAR    Family History  Problem Relation Age of Onset  . Heart failure Brother  Prior to Admission medications   Medication Sig Start Date End Date Taking? Authorizing Provider  alum & mag hydroxide-simeth (MINTOX) 200-200-20 MG/5ML suspension Take 30 mLs by mouth every 4 (four) hours as needed for indigestion or heartburn.    [provider]  benzocaine (ORAJEL) 10 % mucosal gel Use as directed 1 application in the mouth or throat as needed for mouth pain.    [provider]  cholecalciferol (VITAMIN D) 1000 UNITS tablet Take 2,000 Units by mouth daily.      [provider]  feeding supplement (BOOST HIGH PROTEIN) LIQD Take 1 Container by mouth 2 (two) times daily between meals.    [provider]  finasteride (PROSCAR) 5 MG tablet Take 5 mg by mouth daily.  01/25/13   [provider]  fluocinonide (LIDEX) 0.05 % external solution Apply 1 application topically as needed (for itching).     [provider]  gabapentin (NEURONTIN) 100 MG capsule Take 100 mg by mouth daily with supper.     [provider]  guaiFENesin (ROBITUSSIN) 100 MG/5ML liquid Take by mouth every 4 (four) hours as needed for cough or congestion (Give 5 mL).     [provider]  hydrocortisone 1 % lotion Apply 1 application topically 2 (two) times daily as needed for itching.    [provider]  ketoconazole (NIZORAL) 2 % shampoo Apply 1 application topically once a week.    [provider]  levothyroxine (SYNTHROID, LEVOTHROID) 50 MCG tablet Take 50 mcg by mouth daily before breakfast.  01/10/13   [provider]  Lidocaine (ASPERCREME LIDOCAINE) 4 % PTCH Apply 1 patch topically daily. Leave on for 12 hours    [provider]  losartan (COZAAR) 25 MG tablet Take 25 mg by mouth daily.  12/31/12   [provider]  magic mouthwash w/lidocaine SOLN Take 5-10 mLs by mouth every 4 (four) hours as needed for mouth pain.    [provider]  Nasal Dilators (BREATHE RIGHT) STRP Apply 1 strip topically at bedtime.    [provider]  omeprazole (PRILOSEC) 20 MG capsule Take 20 mg by mouth daily.    [provider]  oxybutynin (DITROPAN-XL) 5 MG 24 hr tablet Take 5 mg by mouth daily.     [provider]  oxyCODONE (OXY IR/ROXICODONE) 5 MG immediate release tablet Take one tablet by mouth twice daily as needed for breakthrough pain 11/03/16   Ngetich, Dinah C, NP  oxyCODONE (OXYCONTIN) 10 mg 12 hr tablet Take one tablet by mouth every morning for pain. Do not crush or  chew. 01/23/17   Ngetich, Dinah C, NP  polyethylene glycol (MIRALAX / GLYCOLAX) packet Take 17 g by mouth daily.    [provider]  polyvinyl alcohol (ARTIFICIAL TEARS) 1.4 % ophthalmic solution Place 1 drop into both eyes as needed.     [provider]  sennosides-docusate sodium (SENOKOT-S) 8.6-50 MG tablet Take 1 tablet by mouth every other day.     [provider]  sennosides-docusate sodium (SENOKOT-S) 8.6-50 MG tablet Take 1 tablet by mouth daily as needed for constipation.     [provider]  sodium chloride (OCEAN) 0.65 % SOLN nasal spray Place 1 spray into both nostrils 3 (three) times daily as needed for congestion.    [provider]  triamcinolone (KENALOG) 0.1 % paste Use as directed 1 application in the mouth or throat as directed. 3-6 times daily    [provider]  Physical Exam: Vitals:   04/23/17 1445 04/23/17 1500 04/23/17 1530 04/23/17 1600  BP: (!) 181/80 (!) 183/76 (!) 164/79 (!) 166/75  Pulse: 74 81 76 73  Resp: 10 12 15 16   SpO2: 92% 94% 97% (!) 82%    Wt Readings from Last 3 Encounters:  04/13/17 63 kg (138 lb 12.8 oz)  04/05/17 61.7 kg (136 lb 0.4 oz)  03/23/17 62.1 kg (137 lb)    General:  Appears calm and comfortable; hard of hearing Eyes:  PERRL, EOMI, normal lids, iris ENT:  grossly normal hearing, lips & tongue Neck:  no LAD, masses or thyromegaly Cardiovascular:  RRR, no m/r/g. No LE edema.  Respiratory:  CTA bilaterally, no w/r/r. Normal respiratory effort. Abdomen:  soft, mild distension, tinkling BS, diffuse ttp, nrng Skin:  no rash or induration seen on limited exam; except psoriasis Musculoskeletal:  grossly normal tone BUE/BLE Psychiatric:  grossly normal mood and affect, speech fluent and appropriate Neurologic:  CN 2-12 grossly intact, moves all extremities in coordinated fashion.          Labs on Admission:  Basic Metabolic Panel: Recent Labs  Lab 04/23/17 1231  NA 137  K 4.3    CL 103  CO2 23  GLUCOSE 135*  BUN 23*  CREATININE 1.15  CALCIUM 9.1   Liver Function Tests: Recent Labs  Lab 04/23/17 1231  AST 23  ALT 11*  ALKPHOS 105  BILITOT 1.1  PROT 7.0  ALBUMIN 3.8   No results for input(s): LIPASE, AMYLASE in the last 168 hours. No results for input(s): AMMONIA in the last 168 hours. CBC: Recent Labs  Lab 04/23/17 1231  WBC 8.2  NEUTROABS 7.0  HGB 11.4*  HCT 35.1*  MCV 88.9  PLT 147*   Cardiac Enzymes: No results for input(s): CKTOTAL, CKMB, CKMBINDEX, TROPONINI in the last 168 hours.  BNP (last 3 results) No results for input(s): BNP in the last 8760 hours.  ProBNP (last 3 results) No results for input(s): PROBNP in the last 8760 hours.   Serum creatinine: 1.15 mg/dL 91/47/82 9562 Estimated creatinine clearance: 29.7 mL/min  CBG: No results for input(s): GLUCAP in the last 168 hours.  Radiological Exams on Admission: Ct Abdomen Pelvis W Contrast  Result Date: 04/23/2017 CLINICAL DATA:  Abdominal pain and vomiting. EXAM: CT ABDOMEN AND PELVIS WITH CONTRAST TECHNIQUE: Multidetector CT imaging of the abdomen and pelvis was performed using the standard protocol following bolus administration of intravenous contrast. CONTRAST:  ISOVUE-300 IOPAMIDOL (ISOVUE-300) INJECTION 61% COMPARISON:  CT abdomen pelvis dated February 07, 2014. FINDINGS: Lower chest: Borderline aneurysmal ascending thoracic aorta, measuring 4.0 cm. Enlargement of the right main pulmonary artery, measuring up to 3.6 cm. Bibasilar atelectasis. Hepatobiliary: No focal liver abnormality. Cholelithiasis. No gallbladder wall thickening or biliary dilatation. Pancreas: Mild parenchymal atrophy. Prominence of the main pancreatic duct, measuring up to 4 mm proximally. Spleen: Normal in size without focal abnormality. Adrenals/Urinary Tract: Adrenal glands are unremarkable. Kidneys are normal, without renal calculi, focal lesion, or hydronephrosis. Bladder is mildly distended.  Stomach/Bowel: Large hiatal hernia. Multiple dilated loops of proximal to mid small bowel, with a transition point seen in the right anterior abdomen (series 3, image 50). Borderline dilated right and transverse colon containing a large amount of stool. The appendix is not visualized. Vascular/Lymphatic: Tortuous, atherosclerotic abdominal aorta, with large infrarenal abdominal aortic aneurysm status post stent graft. The excluded aneurysm sac as enlarged in size, now measuring 8.4 cm, previously 6.8 cm. Contrast within the excluded aneurysmal  sac, consistent with type 2 endoleak, unchanged. No lymphadenopathy. Reproductive: Prostate is unremarkable. Other: No free fluid or pneumoperitoneum. Musculoskeletal: No acute or significant osseous findings. IMPRESSION: 1. Small bowel obstruction with transition point seen in the right anterior abdomen. 2. Patent bifurcated abdominal aortic stent graft with persistent type 2 endoleak and continued enlargement of the infrarenal abdominal aortic aneurysm, now measuring 8.4 cm, previously 6.8 cm. Vascular surgery consultation recommended due to increased risk of rupture for AAA >5.5 cm. This recommendation follows ACR consensus guidelines: White Paper of the ACR Incidental Findings Committee II on Vascular Findings. J Am Coll Radiol 2013; 10:789-794. 3. Borderline aneurysmal ascending thoracic aorta, measuring 4.0 cm. Recommend annual imaging followup by CTA or MRA. This recommendation follows 2010 ACCF/AHA/AATS/ACR/ASA/SCA/SCAI/SIR/STS/SVM Guidelines for the Diagnosis and Management of Patients with Thoracic Aortic Disease. Circulation. 2010; 121: O962-X528 4. Cholelithiasis. Electronically Signed   By: Obie Dredge M.D.   On: 04/23/2017 14:49    EKG: Independently reviewed. 1st degree AV block, NSR  Assessment/Plan Active Problems:   SBO (small bowel obstruction) (HCC)  Small Bowel Obstruction NPO MIVF Zofran PRN Pt not a surgical candidate at this time 1st  LA wnl, repeat pending  Mouth Pain Benzocaine Gel prn  Hypertension When necessary hydralazine 10 mg IV as needed for severe blood pressure Hold cozaar  AAA BP goal per family is 140/90 B/t annual revisit and now AAA has not grown significantly Old scan report reviewed  Nasal congestion Breath right strip at night  Bladder Dysf Hold ditropan  BPH Hold proscar Prn bladder scan  GERD IV PPI  Chronic pain Hold gabapentin, oxycodone and oxycontin Cont lidocaine patch  Skin conditions Hold lidex and nozoral  Hypothyroidism Cont OP synthroid 50 mcg qd No signs of hyper or hypothyroidism  *pt not hypoxic entry about is error  Code Status: FC  DVT Prophylaxis: heparin Family Communication: son at bedside Disposition Plan: Pending Improvement  Status: obs medsurg  Haydee Salter, MD Family Medicine [facility] [email or web address] Password [email or web address]

## 2017-04-24 ENCOUNTER — Observation Stay (HOSPITAL_COMMUNITY): Payer: Medicare PPO

## 2017-04-24 DIAGNOSIS — I714 Abdominal aortic aneurysm, without rupture: Secondary | ICD-10-CM | POA: Diagnosis present

## 2017-04-24 DIAGNOSIS — K56609 Unspecified intestinal obstruction, unspecified as to partial versus complete obstruction: Secondary | ICD-10-CM | POA: Diagnosis present

## 2017-04-24 DIAGNOSIS — Z8249 Family history of ischemic heart disease and other diseases of the circulatory system: Secondary | ICD-10-CM | POA: Diagnosis not present

## 2017-04-24 DIAGNOSIS — Z79891 Long term (current) use of opiate analgesic: Secondary | ICD-10-CM | POA: Diagnosis not present

## 2017-04-24 DIAGNOSIS — G8929 Other chronic pain: Secondary | ICD-10-CM | POA: Diagnosis present

## 2017-04-24 DIAGNOSIS — R0981 Nasal congestion: Secondary | ICD-10-CM | POA: Diagnosis present

## 2017-04-24 DIAGNOSIS — E039 Hypothyroidism, unspecified: Secondary | ICD-10-CM | POA: Diagnosis present

## 2017-04-24 DIAGNOSIS — K56691 Other complete intestinal obstruction: Secondary | ICD-10-CM

## 2017-04-24 DIAGNOSIS — E559 Vitamin D deficiency, unspecified: Secondary | ICD-10-CM | POA: Diagnosis present

## 2017-04-24 DIAGNOSIS — Z66 Do not resuscitate: Secondary | ICD-10-CM | POA: Diagnosis present

## 2017-04-24 DIAGNOSIS — H9190 Unspecified hearing loss, unspecified ear: Secondary | ICD-10-CM | POA: Diagnosis present

## 2017-04-24 DIAGNOSIS — N319 Neuromuscular dysfunction of bladder, unspecified: Secondary | ICD-10-CM | POA: Diagnosis present

## 2017-04-24 DIAGNOSIS — K1379 Other lesions of oral mucosa: Secondary | ICD-10-CM | POA: Diagnosis present

## 2017-04-24 DIAGNOSIS — I440 Atrioventricular block, first degree: Secondary | ICD-10-CM | POA: Diagnosis present

## 2017-04-24 DIAGNOSIS — I712 Thoracic aortic aneurysm, without rupture: Secondary | ICD-10-CM | POA: Diagnosis present

## 2017-04-24 DIAGNOSIS — R109 Unspecified abdominal pain: Secondary | ICD-10-CM | POA: Diagnosis not present

## 2017-04-24 DIAGNOSIS — N400 Enlarged prostate without lower urinary tract symptoms: Secondary | ICD-10-CM | POA: Diagnosis present

## 2017-04-24 DIAGNOSIS — K219 Gastro-esophageal reflux disease without esophagitis: Secondary | ICD-10-CM | POA: Diagnosis present

## 2017-04-24 DIAGNOSIS — I10 Essential (primary) hypertension: Secondary | ICD-10-CM | POA: Diagnosis present

## 2017-04-24 LAB — MAGNESIUM: Magnesium: 2.2 mg/dL (ref 1.7–2.4)

## 2017-04-24 LAB — CBC
HCT: 34 % — ABNORMAL LOW (ref 39.0–52.0)
Hemoglobin: 11.1 g/dL — ABNORMAL LOW (ref 13.0–17.0)
MCH: 29.3 pg (ref 26.0–34.0)
MCHC: 32.6 g/dL (ref 30.0–36.0)
MCV: 89.7 fL (ref 78.0–100.0)
Platelets: 158 K/uL (ref 150–400)
RBC: 3.79 MIL/uL — ABNORMAL LOW (ref 4.22–5.81)
RDW: 13.9 % (ref 11.5–15.5)
WBC: 6.4 K/uL (ref 4.0–10.5)

## 2017-04-24 LAB — BASIC METABOLIC PANEL WITH GFR
Anion gap: 9 (ref 5–15)
BUN: 23 mg/dL — ABNORMAL HIGH (ref 6–20)
CO2: 24 mmol/L (ref 22–32)
Calcium: 8.7 mg/dL — ABNORMAL LOW (ref 8.9–10.3)
Chloride: 103 mmol/L (ref 101–111)
Creatinine, Ser: 1.13 mg/dL (ref 0.61–1.24)
GFR calc Af Amer: 59 mL/min — ABNORMAL LOW
GFR calc non Af Amer: 51 mL/min — ABNORMAL LOW
Glucose, Bld: 106 mg/dL — ABNORMAL HIGH (ref 65–99)
Potassium: 4.7 mmol/L (ref 3.5–5.1)
Sodium: 136 mmol/L (ref 135–145)

## 2017-04-24 LAB — PHOSPHORUS: Phosphorus: 3.5 mg/dL (ref 2.5–4.6)

## 2017-04-24 MED ORDER — BOOST / RESOURCE BREEZE PO LIQD CUSTOM
1.0000 | Freq: Three times a day (TID) | ORAL | Status: DC
  Administered 2017-04-24 – 2017-04-25 (×2): 1 via ORAL

## 2017-04-24 NOTE — Progress Notes (Signed)
Pt had a watery bowel movement this evening

## 2017-04-24 NOTE — Progress Notes (Addendum)
PROGRESS NOTE  Ricardo [name] ZOX:[id number] DOB: [birth year] DOA: 04/23/2017 PCP: Oneal Grout, MD   LOS: 0 days   Brief Narrative / Interim history: Ricardo [name] is a 84 y.o. male with past medical history significant for AAA, recent ileus, congestive heart failure, fecal impaction, hypertension, hyponatremia and colitis presents to the emergency room with abdominal pain. He was diagnosed with SBO and admitted to the hospital   Assessment & Plan: Active Problems:   SBO (small bowel obstruction) (HCC)   Small Bowel Obstruction -abdominal XR this morning reassuring -no nausea / vomiting, no need for NG tube  -Zofran PRN -Per night RN patient had a small BM, will allow clears today and continue to monitor  Hypertension -When necessary hydralazine 10 mg IV as needed for severe blood pressure Hold cozaar  AAA -BP goal is 140/90 -outpatient follow up   Bladder Dysfunction -Hold ditropan  BPH -Hold proscar  GERD -IV PPI  Chronic pain -Hold gabapentin, oxycodone and oxycontin -Cont lidocaine patch  Hypothyroidism -Continut synthroid 50 mcg qd    DVT prophylaxis: heparin Code Status: DNR Family Communication: no family at bedside Disposition Plan: home when ready   Consultants:   None   Procedures:   None   Antimicrobials:  None    Subjective: -complains of abdominal pain still, poor historian, very HOH  Objective: Vitals:   04/23/17 2045 04/23/17 2112 04/23/17 2200 04/24/17 0432  BP: 121/73 (!) 163/71 140/63 139/61  Pulse:  64 (!) 55 (!) 52  Resp: 15 16  16   Temp:  97.6 F (36.4 C)  (!) 97.4 F (36.3 C)  TempSrc:  Oral  Oral  SpO2:  94%  98%  Weight:  59.3 kg (130 lb 12.8 oz)      Intake/Output Summary (Last 24 hours) at 04/24/2017 1108 Last data filed at 04/24/2017 0900 Gross per 24 hour  Intake 1438.33 ml  Output 700 ml  Net 738.33 ml   Filed Weights   04/23/17 2112  Weight: 59.3 kg (130 lb 12.8 oz)     Examination:  Constitutional: NAD Respiratory: CTA biL Cardiovascular: RRR Abdomen: mild tenderness throughout, no guarding, no rebound. Decreased BS   Data Reviewed: I have independently reviewed following labs and imaging studies   CBC: Recent Labs  Lab 04/23/17 1231 04/24/17 0649  WBC 8.2 6.4  NEUTROABS 7.0  --   HGB 11.4* 11.1*  HCT 35.1* 34.0*  MCV 88.9 89.7  PLT 147* 158   Basic Metabolic Panel: Recent Labs  Lab 04/23/17 1231 04/24/17 0649  NA 137 136  K 4.3 4.7  CL 103 103  CO2 23 24  GLUCOSE 135* 106*  BUN 23* 23*  CREATININE 1.15 1.13  CALCIUM 9.1 8.7*  MG  --  2.2  PHOS  --  3.5   GFR: Estimated Creatinine Clearance: 28.4 mL/min (by C-G formula based on SCr of 1.13 mg/dL). Liver Function Tests: Recent Labs  Lab 04/23/17 1231  AST 23  ALT 11*  ALKPHOS 105  BILITOT 1.1  PROT 7.0  ALBUMIN 3.8   No results for input(s): LIPASE, AMYLASE in the last 168 hours. No results for input(s): AMMONIA in the last 168 hours. Coagulation Profile: No results for input(s): INR, PROTIME in the last 168 hours. Cardiac Enzymes: No results for input(s): CKTOTAL, CKMB, CKMBINDEX, TROPONINI in the last 168 hours. BNP (last 3 results) No results for input(s): PROBNP in the last 8760 hours. HbA1C: No results for input(s): HGBA1C in the last 72 hours. CBG:  No results for input(s): GLUCAP in the last 168 hours. Lipid Profile: No results for input(s): CHOL, HDL, LDLCALC, TRIG, CHOLHDL, LDLDIRECT in the last 72 hours. Thyroid Function Tests: No results for input(s): TSH, T4TOTAL, FREET4, T3FREE, THYROIDAB in the last 72 hours. Anemia Panel: No results for input(s): VITAMINB12, FOLATE, FERRITIN, TIBC, IRON, RETICCTPCT in the last 72 hours. Urine analysis:    Component Value Date/Time   COLORURINE YELLOW 04/23/2017 1250   APPEARANCEUR CLEAR 04/23/2017 1250   LABSPEC 1.011 04/23/2017 1250   PHURINE 6.0 04/23/2017 1250   GLUCOSEU NEGATIVE 04/23/2017 1250    HGBUR SMALL (A) 04/23/2017 1250   BILIRUBINUR NEGATIVE 04/23/2017 1250   KETONESUR 5 (A) 04/23/2017 1250   PROTEINUR 100 (A) 04/23/2017 1250   UROBILINOGEN 1.0 12/13/2010 1129   NITRITE NEGATIVE 04/23/2017 1250   LEUKOCYTESUR NEGATIVE 04/23/2017 1250   Sepsis Labs: Invalid input(s): PROCALCITONIN, LACTICIDVEN  No results found for this or any previous visit (from the past 240 hour(s)).    Radiology Studies: Ct Abdomen Pelvis W Contrast  Result Date: 04/23/2017 CLINICAL DATA:  Abdominal pain and vomiting. EXAM: CT ABDOMEN AND PELVIS WITH CONTRAST TECHNIQUE: Multidetector CT imaging of the abdomen and pelvis was performed using the standard protocol following bolus administration of intravenous contrast. CONTRAST:  100mL ISOVUE-300 IOPAMIDOL (ISOVUE-300) INJECTION 61% COMPARISON:  CT abdomen pelvis dated February 07, 2014. FINDINGS: Lower chest: Borderline aneurysmal ascending thoracic aorta, measuring 4.0 cm. Enlargement of the right main pulmonary artery, measuring up to 3.6 cm. Bibasilar atelectasis. Hepatobiliary: No focal liver abnormality. Cholelithiasis. No gallbladder wall thickening or biliary dilatation. Pancreas: Mild parenchymal atrophy. Prominence of the main pancreatic duct, measuring up to 4 mm proximally. Spleen: Normal in size without focal abnormality. Adrenals/Urinary Tract: Adrenal glands are unremarkable. Kidneys are normal, without renal calculi, focal lesion, or hydronephrosis. Bladder is mildly distended. Stomach/Bowel: Large hiatal hernia. Multiple dilated loops of proximal to mid small bowel, with a transition point seen in the right anterior abdomen (series 3, image 50). Borderline dilated right and transverse colon containing a large amount of stool. The appendix is not visualized. Vascular/Lymphatic: Tortuous, atherosclerotic abdominal aorta, with large infrarenal abdominal aortic aneurysm status post stent graft. The excluded aneurysm sac as enlarged in size, now measuring  8.4 cm, previously 6.8 cm. Contrast within the excluded aneurysmal sac, consistent with type 2 endoleak, unchanged. No lymphadenopathy. Reproductive: Prostate is unremarkable. Other: No free fluid or pneumoperitoneum. Musculoskeletal: No acute or significant osseous findings. IMPRESSION: 1. Small bowel obstruction with transition point seen in the right anterior abdomen. 2. Patent bifurcated abdominal aortic stent graft with persistent type 2 endoleak and continued enlargement of the infrarenal abdominal aortic aneurysm, now measuring 8.4 cm, previously 6.8 cm. Vascular surgery consultation recommended due to increased risk of rupture for AAA >5.5 cm. This recommendation follows ACR consensus guidelines: White Paper of the ACR Incidental Findings Committee II on Vascular Findings. J Am Coll Radiol 2013; 10:789-794. 3. Borderline aneurysmal ascending thoracic aorta, measuring 4.0 cm. Recommend annual imaging followup by CTA or MRA. This recommendation follows 2010 ACCF/AHA/AATS/ACR/ASA/SCA/SCAI/SIR/STS/SVM Guidelines for the Diagnosis and Management of Patients with Thoracic Aortic Disease. Circulation. 2010; 121: [id number] 4. Cholelithiasis. Electronically Signed   By: Obie DredgeWilliam T Derry M.D.   On: 04/23/2017 14:49   Dg Abd Portable 2v  Result Date: 04/24/2017 CLINICAL DATA:  Abdominal pain. EXAM: PORTABLE ABDOMEN - 2 VIEW COMPARISON:  CT 04/23/2017 FINDINGS: Again noted is an abdominal aortic stent graft. Contrast within the urinary bladder from recent CT examination. Nonobstructive bowel  gas pattern. No evidence for free air on the decubitus image. Multiple phleboliths in the pelvis. Again noted is colonic stool in the upper abdomen. Scoliosis and multilevel degenerative disease in the lumbar spine. IMPRESSION: No acute abnormality. Electronically Signed   By: Richarda Overlie M.D.   On: 04/24/2017 08:15     Scheduled Meds: . finasteride  5 mg Oral Daily  . heparin  5,000 Units Subcutaneous Q8H  . levothyroxine   50 mcg Oral QAC breakfast  . lidocaine  1 patch Transdermal Q24H  . pantoprazole (PROTONIX) IV  40 mg Intravenous QHS   Continuous Infusions: . dextrose 5 % and 0.45% NaCl 100 mL/hr at 04/24/17 0138     Pamella Pert, MD, PhD [facility] Pager [phone]  If 7PM-7AM, please contact night-coverage [email or web address] Password [email or web address] 04/24/2017, 11:08 AM

## 2017-04-24 NOTE — Progress Notes (Signed)
Initial Nutrition Assessment  DOCUMENTATION CODES:   Not applicable  INTERVENTION:   -Boost Breeze po TID, each supplement provides 250 kcal and 9 grams of protein -RD will follow for diet advancement  NUTRITION DIAGNOSIS:   Inadequate oral intake related to altered GI function as evidenced by NPO status.  GOAL:   Patient will meet greater than or equal to 90% of their needs  MONITOR:   PO intake, Supplement acceptance, Diet advancement, Labs, Weight trends, Skin, I & O's  REASON FOR ASSESSMENT:   Malnutrition Screening Tool    ASSESSMENT:   Ricardo [name] is a [age] y.o. male with past medical history significant for AAA, recent ileus, congestive heart failure, fecal impaction, hypertension, hyponatremia and colitis presents to the emergency room with extreme abdominal pain.   Pt admitted with SBO.   Pt is very HOH. Spoke with pt son and daughter-in-law at bedside, who are very involved in pt care. [facility] Per family, pt has a fair appetite, typically consuming 50% of meals provided to him (eats a varied diet). They deny that pt is on a modified diet PTA. Pt has no difficulty chewing or swallowing foods. They have recently started the Boost Breeze supplements, as he enjoyed them during his previous hospitalization.   Per pt family, pt UBW was around 165# when he entered ALF around 8 years ago. They have a seen a gradual weight loss over the past several year, however, deny any recent changes per contacts from ALF. Noted pt has experienced a 9.7% wt loss over the past year, which is not significant for time frame.   Per daughter-in-law, pt is still able to ambulate with his walker, however, extent of activity varies on a daily basis related to pt back pain. They have noticed muscle loss over the past several years, which she contributes to decline in mobility and advanced age. This RD also suspects some degree of fat and muscle loss related to  advanced age.   Family is eager for pt to eat. They are amenable to resume Boost Breeze supplements once diet is advanced. Noted pt was advanced to clear liquid diet after RD visit.   Labs reviewed.   NUTRITION - FOCUSED PHYSICAL EXAM:    Most Recent Value  Orbital Region  Moderate depletion  Upper Arm Region  Moderate depletion  Thoracic and Lumbar Region  Moderate depletion  Buccal Region  Mild depletion  Temple Region  Mild depletion  Clavicle Bone Region  Mild depletion  Clavicle and Acromion Bone Region  Mild depletion  Scapular Bone Region  Mild depletion  Dorsal Hand  Moderate depletion  Patellar Region  Moderate depletion  Anterior Thigh Region  Moderate depletion  Posterior Calf Region  Moderate depletion  Edema (RD Assessment)  None  Hair  Reviewed  Eyes  Reviewed  Mouth  Reviewed  Skin  Reviewed  Nails  Reviewed       Diet Order:  Diet clear liquid Room service appropriate? Yes; Fluid consistency: Thin  EDUCATION NEEDS:   Education needs have been addressed  Skin:  Skin Assessment: Reviewed RN Assessment  Last BM:  04/22/17  Height:   Ht Readings from Last 1 Encounters:  04/13/17 5\' 9"  (1.753 m)    Weight:   Wt Readings from Last 1 Encounters:  04/23/17 130 lb 12.8 oz (59.3 kg)    Ideal Body Weight:  72.7 kg  BMI:  Body mass index is 19.32 kg/m.  Estimated Nutritional  Needs:   Kcal:  1500-1700  Protein:  70-85 grams  Fluid:  1.5-1.7 L    Keyetta Hollingworth A. Mayford Knife, RD, LDN, CDE Pager: [phone] After hours Pager: [phone]

## 2017-04-24 NOTE — Progress Notes (Deleted)
Constant c/o of either pain or nausea despite being medicated. Now says she is getting spasms and even stated that the pain patch might be making her worse. Discussed using anti anxiety medication and pt agreeable

## 2017-04-25 DIAGNOSIS — K56609 Unspecified intestinal obstruction, unspecified as to partial versus complete obstruction: Principal | ICD-10-CM

## 2017-04-25 LAB — BASIC METABOLIC PANEL
Anion gap: 8 (ref 5–15)
BUN: 20 mg/dL (ref 6–20)
CHLORIDE: 103 mmol/L (ref 101–111)
CO2: 24 mmol/L (ref 22–32)
Calcium: 8.4 mg/dL — ABNORMAL LOW (ref 8.9–10.3)
Creatinine, Ser: 1.1 mg/dL (ref 0.61–1.24)
GFR calc Af Amer: >60 mL/min (ref >60)
GFR calc non Af Amer: 53 mL/min — ABNORMAL LOW (ref >60)
GLUCOSE: 95 mg/dL (ref 65–99)
POTASSIUM: 4.1 mmol/L (ref 3.5–5.1)
Sodium: 135 mmol/L (ref 135–145)

## 2017-04-25 LAB — CBC
HCT: 32.1 % — ABNORMAL LOW (ref 39.0–52.0)
Hemoglobin: 10.6 g/dL — ABNORMAL LOW (ref 13.0–17.0)
MCH: 29.5 pg (ref 26.0–34.0)
MCHC: 33 g/dL (ref 30.0–36.0)
MCV: 89.4 fL (ref 78.0–100.0)
Platelets: 154 10*3/uL (ref 150–400)
RBC: 3.59 MIL/uL — ABNORMAL LOW (ref 4.22–5.81)
RDW: 13.6 % (ref 11.5–15.5)
WBC: 6.7 10*3/uL (ref 4.0–10.5)

## 2017-04-25 MED ORDER — PANTOPRAZOLE SODIUM 40 MG PO TBEC: 40.0000 mg | DELAYED_RELEASE_TABLET | Freq: Every day | ORAL | Status: DC

## 2017-04-25 NOTE — Discharge Summary (Signed)
Physician Discharge Summary  Ricardo [name] EXB:[id number] DOB: 07/17/1913 DOA: 04/23/2017  PCP: Oneal GroutPandey, Mahima, MD  Admit date: 04/23/2017 Discharge date: 04/25/2017  Admitted From: ALF Disposition:  ALF   Recommendations for Outpatient Follow-up:  1. Follow up with PCP in 1 week  Discharge Condition: Stable CODE STATUS: DNR  Diet recommendation: Heart healthy, soft diet   Brief/Interim Summary: HPI by Dr. Melynda RippleHobbs: Ricardo GanserHerbert W [name] is a [age] y.o. male with past medical history significant for AAA, recent ileus, congestive heart failure, fecal impaction, hypertension, hyponatremia and colitis presents to the emergency room with extreme abdominal pain.  Patient extreme abdominal pain around 8 AM this morning.  Pain radiated to the patient's back.  Patient family concerned the patient was having dissection of his AAA.  Patient was brought to the emergency room for evaluation.  Patient has had no suspicious ingestions or significant medication change recently.  No known sick contacts or GI symptoms.  ED course: EDP ordered imaging of the abdomen and pelvis.  No dissection seen but small bowel obstruction seen.  Patient family opted not to have NG tube placed.  Fluid started.  Hospitalist consulted for admission.  Interim: Patient was managed with conservative management. NGT was not needed. He did not have any further nausea, vomiting. AXR was completed with resolution of SBO. He had watery BM night prior to discharge and tolerated meal prior to discharge to ALF.   Discharge Diagnoses:  Active Problems:   SBO (small bowel obstruction) (HCC)   Small Bowel Obstruction -CT abd/pelvis: Small bowel obstruction with transition point seen in the right anterior abdomen. -AXR 2/15: No acute abnormality  -Had BM, tolerating diet.   Hypertension -Resume cozaar   AAA -CT abd/pelvis: Patent bifurcated abdominal aortic stent graft with persistent type 2 endoleak and continued enlargement of  the infrarenal abdominal aortic aneurysm, now measuring 8.4 cm, previously 6.8 cm. Vascular surgery consultation recommended due to increased risk of rupture for AAA >5.5 cm. Borderline aneurysmal ascending thoracic aorta, measuring 4.0 cm. -BP goal is 140/90 -Has been previously evaluated by Dr. Darrick PennaFields on 04/06/2017. He is not a candidate for repair. No further follow up necessary. If aneurysm ruptures, recommend palliative care for pain control.   Bladder Dysfunction -Resume ditropan  BPH -Resume proscar  GERD -PPI  Chronic pain -Resume gabapentin, oxycodone and oxycontin -Cont lidocaine patch  Hypothyroidism -Resume synthroid   Discharge Instructions  Discharge Instructions    Call MD for:   Complete by:  As directed    Worsening abdominal pain   Call MD for:  difficulty breathing, headache or visual disturbances   Complete by:  As directed    Call MD for:  extreme fatigue   Complete by:  As directed    Call MD for:  hives   Complete by:  As directed    Call MD for:  persistant dizziness or light-headedness   Complete by:  As directed    Call MD for:  persistant nausea and vomiting   Complete by:  As directed    Call MD for:  severe uncontrolled pain   Complete by:  As directed    Call MD for:  temperature >100.4   Complete by:  As directed    DIET SOFT   Complete by:  As directed    Discharge instructions   Complete by:  As directed    You were cared for by a hospitalist during your hospital stay. If you have any questions about your discharge medications  or the care you received while you were in the hospital after you are discharged, you can call the unit and asked to speak with the hospitalist on call if the hospitalist that took care of you is not available. Once you are discharged, your primary care physician will handle any further medical issues. Please note that NO REFILLS for any discharge medications will be authorized once you are discharged, as it  is imperative that you return to your primary care physician (or establish a relationship with a primary care physician if you do not have one) for your aftercare needs so that they can reassess your need for medications and monitor your lab values.   Increase activity slowly   Complete by:  As directed      Allergies as of 04/25/2017      Reactions   Aleve [naproxen] Other (See Comments)   Per MAR   Indocin [indomethacin] Other (See Comments)   Per MAR      Medication List    TAKE these medications   ASPERCREME LIDOCAINE 4 % Ptch Generic drug:  Lidocaine Apply 1 patch topically See admin instructions. 1 patch applied once a day to mid lower back   benzocaine 10 % mucosal gel Commonly known as:  ORAJEL Use as directed 1 application in the mouth or throat as needed for mouth pain.   bisacodyl 10 MG/30ML Enem Commonly known as:  FLEET Place 10 mg rectally once as needed (if hard stool is present).   DEXTRAN 70-HYPROMELLOSE (PF) OP Place 1 drop into both eyes as needed (for dry eyes).   feeding supplement (BOOST BREEZE) Liqd Take 1 Can by mouth 2 (two) times daily between meals.   finasteride 5 MG tablet Commonly known as:  PROSCAR Take 5 mg by mouth daily.   fluocinonide 0.05 % external solution Commonly known as:  LIDEX Apply 1 application topically as needed (for itchy scalp).   gabapentin 100 MG capsule Commonly known as:  NEURONTIN Take 100 mg by mouth daily with supper.   hydrocortisone 1 % lotion Apply 1 application topically 2 (two) times daily as needed (for itching to rash on back).   ketoconazole 2 % shampoo Commonly known as:  NIZORAL Apply 1 application topically every Tuesday. SCALP   levothyroxine 50 MCG tablet Commonly known as:  SYNTHROID, LEVOTHROID Take 50 mcg by mouth daily before breakfast.   losartan 25 MG tablet Commonly known as:  COZAAR Take 25 mg by mouth daily.   magic mouthwash w/lidocaine Soln Take 5-10 mLs by mouth every 4 (four)  hours as needed for mouth pain.   magnesium hydroxide 400 MG/5ML suspension Commonly known as:  MILK OF MAGNESIA Take 30 mLs by mouth once as needed for mild constipation.   MINTOX 200-200-20 MG/5ML suspension Generic drug:  alum & mag hydroxide-simeth Take 30 mLs by mouth every 4 (four) hours as needed for indigestion or heartburn (for 48 hours and call MD if symptoms persist).   omeprazole 20 MG capsule Commonly known as:  PRILOSEC Take 20 mg by mouth daily.   oxybutynin 5 MG 24 hr tablet Commonly known as:  DITROPAN-XL Take 5 mg by mouth daily.   oxyCODONE 5 MG immediate release tablet Commonly known as:  Oxy IR/ROXICODONE Take one tablet by mouth twice daily as needed for breakthrough pain What changed:    how much to take  how to take this  when to take this  reasons to take this  additional instructions   oxyCODONE  10 mg 12 hr tablet Commonly known as:  OXYCONTIN Take one tablet by mouth every morning for pain. Do not crush or chew. What changed:    how much to take  how to take this  when to take this  additional instructions   polyethylene glycol packet Commonly known as:  MIRALAX / GLYCOLAX Take 17 g by mouth daily.   sennosides-docusate sodium 8.6-50 MG tablet Commonly known as:  SENOKOT-S Take 2 tablets by mouth at bedtime.   SLEEPRIGHT BREATHE AID Misc 1 strip at bedtime.   sodium chloride 0.65 % Soln nasal spray Commonly known as:  OCEAN Place 1 spray into both nostrils 3 (three) times daily as needed for congestion.   triamcinolone 0.1 % paste Commonly known as:  KENALOG Apply a thin layer to affected area 3-6 times daily (dental)   TUSSIN DM 10-100 MG/5ML liquid Generic drug:  dextromethorphan-guaiFENesin Take 5 mLs by mouth every 4 (four) hours as needed for cough.   Vitamin D3 2000 units Tabs Take 2,000 Units by mouth daily.      Follow-up Information    Oneal Grout, MD. Schedule an appointment as soon as possible for a  visit in 1 week(s).   Specialty:  Internal Medicine Contact information: [address] [place] [address] [phone]          Allergies  Allergen Reactions  . Aleve [Naproxen] Other (See Comments)    Per MAR  . Indocin [Indomethacin] Other (See Comments)    Per MAR    Consultations:  None   Procedures/Studies: Ct Abdomen Pelvis W Contrast  Result Date: 04/23/2017 CLINICAL DATA:  Abdominal pain and vomiting. EXAM: CT ABDOMEN AND PELVIS WITH CONTRAST TECHNIQUE: Multidetector CT imaging of the abdomen and pelvis was performed using the standard protocol following bolus administration of intravenous contrast. CONTRAST:  ISOVUE-300 IOPAMIDOL (ISOVUE-300) INJECTION 61% COMPARISON:  CT abdomen pelvis dated February 07, 2014. FINDINGS: Lower chest: Borderline aneurysmal ascending thoracic aorta, measuring 4.0 cm. Enlargement of the right main pulmonary artery, measuring up to 3.6 cm. Bibasilar atelectasis. Hepatobiliary: No focal liver abnormality. Cholelithiasis. No gallbladder wall thickening or biliary dilatation. Pancreas: Mild parenchymal atrophy. Prominence of the main pancreatic duct, measuring up to 4 mm proximally. Spleen: Normal in size without focal abnormality. Adrenals/Urinary Tract: Adrenal glands are unremarkable. Kidneys are normal, without renal calculi, focal lesion, or hydronephrosis. Bladder is mildly distended. Stomach/Bowel: Large hiatal hernia. Multiple dilated loops of proximal to mid small bowel, with a transition point seen in the right anterior abdomen (series 3, image 50). Borderline dilated right and transverse colon containing a large amount of stool. The appendix is not visualized. Vascular/Lymphatic: Tortuous, atherosclerotic abdominal aorta, with large infrarenal abdominal aortic aneurysm status post stent graft. The excluded aneurysm sac as enlarged in size, now measuring 8.4 cm, previously 6.8 cm. Contrast within the excluded aneurysmal sac,  consistent with type 2 endoleak, unchanged. No lymphadenopathy. Reproductive: Prostate is unremarkable. Other: No free fluid or pneumoperitoneum. Musculoskeletal: No acute or significant osseous findings. IMPRESSION: 1. Small bowel obstruction with transition point seen in the right anterior abdomen. 2. Patent bifurcated abdominal aortic stent graft with persistent type 2 endoleak and continued enlargement of the infrarenal abdominal aortic aneurysm, now measuring 8.4 cm, previously 6.8 cm. Vascular surgery consultation recommended due to increased risk of rupture for AAA >5.5 cm. This recommendation follows ACR consensus guidelines: White Paper of the ACR Incidental Findings Committee II on Vascular Findings. J Am Coll Radiol 2013; 10:789-794. 3. Borderline aneurysmal  ascending thoracic aorta, measuring 4.0 cm. Recommend annual imaging followup by CTA or MRA. This recommendation follows 2010 ACCF/AHA/AATS/ACR/ASA/SCA/SCAI/SIR/STS/SVM Guidelines for the Diagnosis and Management of Patients with Thoracic Aortic Disease. Circulation. 2010; 121: Z610-R604 4. Cholelithiasis. Electronically Signed   By: Obie Dredge M.D.   On: 04/23/2017 14:49   Dg Abd Portable 2v  Result Date: 04/24/2017 CLINICAL DATA:  Abdominal pain. EXAM: PORTABLE ABDOMEN - 2 VIEW COMPARISON:  CT 04/23/2017 FINDINGS: Again noted is an abdominal aortic stent graft. Contrast within the urinary bladder from recent CT examination. Nonobstructive bowel gas pattern. No evidence for free air on the decubitus image. Multiple phleboliths in the pelvis. Again noted is colonic stool in the upper abdomen. Scoliosis and multilevel degenerative disease in the lumbar spine. IMPRESSION: No acute abnormality. Electronically Signed   By: Richarda Overlie M.D.   On: 04/24/2017 08:15   Ct Angio Chest/abd/pel For Dissection W And/or Wo Contrast  Result Date: 04/05/2017 CLINICAL DATA:  Right lower quadrant pain and nausea beginning last night. Palpable abdominal  mass. Previous abdominal aortic aneurysm repair and diverticulosis. EXAM: CT ANGIOGRAPHY CHEST, ABDOMEN AND PELVIS TECHNIQUE: Multidetector CT imaging through the chest, abdomen and pelvis was performed using the standard protocol during bolus administration of intravenous contrast. Multiplanar reconstructed images and MIPs were obtained and reviewed to evaluate the vascular anatomy. CONTRAST:  ISOVUE-370 IOPAMIDOL (ISOVUE-370) INJECTION 76% COMPARISON:  AP CTA on 02/07/2014; no prior chest CTA FINDINGS: CTA CHEST FINDINGS Cardiovascular: Moderate cardiomegaly, without pericardial effusion. Aortic and coronary artery atherosclerosis. Aneurysm of the ascending aorta is seen measuring 4.3 cm. No evidence of aortic dissection. Mediastinum/Nodes: No masses or pathologically enlarged lymph nodes identified. Large hiatal hernia. Lungs/Pleura: No pulmonary mass, infiltrate, or effusion. Upper abdomen: No acute findings. Musculoskeletal: No suspicious bone lesions identified. Review of the MIP images confirms the above findings. CTA ABDOMEN AND PELVIS FINDINGS VASCULAR Aorta: Aorto bi-iliac stent graft remains in place. The native abdominal aortic aneurysm is measures 8.4 cm in maximum diameter compared to 6.7 cm in 2015. No evidence of endoleak. No evidence of retroperitoneal hemorrhage. Celiac: Patent without evidence of aneurysm, dissection, vasculitis or significant stenosis. SMA: Patent without evidence of aneurysm, dissection, vasculitis or significant stenosis. Renals: Both renal arteries are patent without evidence of aneurysm, dissection, vasculitis, fibromuscular dysplasia or significant stenosis. IMA: Patent without evidence of aneurysm, dissection, vasculitis or significant stenosis. Inflow: Patent without evidence of aneurysm, dissection, vasculitis or significant stenosis. Veins: No obvious venous abnormality within the limitations of this arterial phase study. Review of the MIP images confirms the above  findings. NON-VASCULAR Hepatobiliary: Suboptimal visualization due to beam hardening artifact from contrast bolus in the heart and aorta, but no definite hepatic abnormality identified. Small calcified gallstone noted, without evidence of cholecystitis. Pancreas:  No mass or inflammatory changes. Spleen: Within normal limits in size and appearance. Adrenals/Urinary Tract: No masses identified. No evidence of hydronephrosis. Stomach/Bowel: No evidence of obstruction, inflammatory process or abnormal fluid collections. Large hiatal hernia. Diffuse gaseous distention of the colon noted, without evidence of obstruction, highly suspicious for colonic ileus. Vascular/Lymphatic: No pathologically enlarged lymph nodes. No abdominal aortic aneurysm. Reproductive:  No mass or other significant abnormality. Other:  None. Musculoskeletal:  No suspicious bone lesions identified. Review of the MIP images confirms the above findings. IMPRESSION: 4.3 cm ascending thoracic aortic aneurysm. No evidence of aortic dissection. Previous stent graft repair of abdominal aortic aneurysm. Native aneurysm sac now measures 8.4 cm in maximum diameter, compared to 6.7 cm in 2015. No  evidence of endoleak or retroperitoneal hemorrhage. Stable large hiatal hernia. Probable colonic ileus. Cholelithiasis.  No radiographic evidence of cholecystitis. Electronically Signed   By: Myles Rosenthal M.D.   On: 04/05/2017 20:00       Discharge Exam: Vitals:   04/25/17 0606 04/25/17 0832  BP: (!) 169/63 (!) 165/72  Pulse: (!) 54 (!) 58  Resp: 16 16  Temp: 97.7 F (36.5 C) (!) 97.4 F (36.3 C)  SpO2: 95% 96%   Vitals:   04/24/17 1406 04/24/17 2035 04/25/17 0606 04/25/17 0832  BP: (!) 158/63 (!) 146/66 (!) 169/63 (!) 165/72  Pulse: (!) 56 60 (!) 54 (!) 58  Resp: 16 16 16 16   Temp: 98.1 F (36.7 C) 98.1 F (36.7 C) 97.7 F (36.5 C) (!) 97.4 F (36.3 C)  TempSrc: Oral Oral Oral Oral  SpO2: 98% 94% 95% 96%  Weight:        General: Pt is  alert, awake, not in acute distress, hard of hearing  Cardiovascular: RRR, S1/S2 +, no rubs, no gallops Respiratory: CTA bilaterally, no wheezing, no rhonchi Abdominal: Soft, NT, ND, bowel sounds + Extremities: no edema, no cyanosis    The results of significant diagnostics from this hospitalization (including imaging, microbiology, ancillary and laboratory) are listed below for reference.     Microbiology: No results found for this or any previous visit (from the past 240 hour(s)).   Labs: BNP (last 3 results) No results for input(s): BNP in the last 8760 hours. Basic Metabolic Panel: Recent Labs  Lab 04/23/17 1231 04/24/17 0649 04/25/17 0410  NA 137 136 135  K 4.3 4.7 4.1  CL 103 103 103  CO2 23 24 24   GLUCOSE 135* 106* 95  BUN 23* 23* 20  CREATININE 1.15 1.13 1.10  CALCIUM 9.1 8.7* 8.4*  MG  --  2.2  --   PHOS  --  3.5  --    Liver Function Tests: Recent Labs  Lab 04/23/17 1231  AST 23  ALT 11*  ALKPHOS 105  BILITOT 1.1  PROT 7.0  ALBUMIN 3.8   No results for input(s): LIPASE, AMYLASE in the last 168 hours. No results for input(s): AMMONIA in the last 168 hours. CBC: Recent Labs  Lab 04/23/17 1231 04/24/17 0649 04/25/17 0410  WBC 8.2 6.4 6.7  NEUTROABS 7.0  --   --   HGB 11.4* 11.1* 10.6*  HCT 35.1* 34.0* 32.1*  MCV 88.9 89.7 89.4  PLT 147* 158 154   Cardiac Enzymes: No results for input(s): CKTOTAL, CKMB, CKMBINDEX, TROPONINI in the last 168 hours. BNP: Invalid input(s): POCBNP CBG: No results for input(s): GLUCAP in the last 168 hours. D-Dimer No results for input(s): DDIMER in the last 72 hours. Hgb A1c No results for input(s): HGBA1C in the last 72 hours. Lipid Profile No results for input(s): CHOL, HDL, LDLCALC, TRIG, CHOLHDL, LDLDIRECT in the last 72 hours. Thyroid function studies No results for input(s): TSH, T4TOTAL, T3FREE, THYROIDAB in the last 72 hours.  Invalid input(s): FREET3 Anemia work up No results for input(s):  VITAMINB12, FOLATE, FERRITIN, TIBC, IRON, RETICCTPCT in the last 72 hours. Urinalysis    Component Value Date/Time   COLORURINE YELLOW 04/23/2017 1250   APPEARANCEUR CLEAR 04/23/2017 1250   LABSPEC 1.011 04/23/2017 1250   PHURINE 6.0 04/23/2017 1250   GLUCOSEU NEGATIVE 04/23/2017 1250   HGBUR SMALL (A) 04/23/2017 1250   BILIRUBINUR NEGATIVE 04/23/2017 1250   KETONESUR 5 (A) 04/23/2017 1250   PROTEINUR 100 (A) 04/23/2017 1250  UROBILINOGEN 1.0 12/13/2010 1129   NITRITE NEGATIVE 04/23/2017 1250   LEUKOCYTESUR NEGATIVE 04/23/2017 1250   Sepsis Labs Invalid input(s): PROCALCITONIN,  WBC,  LACTICIDVEN Microbiology No results found for this or any previous visit (from the past 240 hour(s)).   Time coordinating discharge: 40 minutes  SIGNED:  Noralee Stain, DO [facility] Pager [phone]  If 7PM-7AM, please contact night-coverage [email or web address] Password [facility] 04/25/2017, 11:54 AM

## 2017-04-25 NOTE — [place] FL2 (Signed)
[place] MEDICAID FL2 LEVEL OF CARE SCREENING TOOL     IDENTIFICATION  Patient Name: Ricardo [name] Birthdate: [birth year] Sex: male Admission Date (Current Location): 04/23/2017  [facility] and [place] Number:  Producer, television/film/video and Address:  [facility], [address], [place], [place] [address]      Provider Number: [id number]  Attending Physician Name and Address:  Noralee Stain, DO  Relative Name and Phone Number:       Current Level of Care: Hospital Recommended Level of Care: Assisted Living Facility Prior Approval Number:    Date Approved/Denied:   PASRR Number:    Discharge Plan: Other (Comment)(ALF)    Current Diagnoses: Patient Active Problem List   Diagnosis Date Noted  . SBO (small bowel obstruction) (HCC) 04/23/2017  . Protein-calorie malnutrition, severe 04/07/2017  . Nausea and vomiting 04/07/2017  . Nausea & vomiting 04/05/2017  . Tachycardia 04/05/2017  . Neuropathic pain 12/08/2016  . Anemia of chronic disease 12/08/2016  . DNR (do not resuscitate)   . Cough 08/07/2015  . Chronic mucus hypersecretion, respiratory 04/10/2015  . Xerostomia 03/27/2015  . Edema 03/27/2015  . Bradycardia by electrocardiogram 09/22/2014  . Spinal stenosis in cervical region 09/06/2013  . Deaf 06/21/2013  . Lumbago 06/21/2013  . Constipation 03/08/2013  . Hypothyroidism 03/08/2013  . BPH (benign prostatic hyperplasia) 03/08/2013  . Depression with anxiety 03/08/2013  . Fall at nursing home 03/08/2013  . AAA (abdominal aortic aneurysm) (HCC) 01/27/2012  . Abnormality of gait 08/01/2010  . Osteoarthritis 05/17/2009  . GERD 03/16/2009  . ULCER-GASTRIC 03/16/2009  . DIVERTICULOSIS-COLON 03/16/2009  . Dyslipidemia 01/30/2009  . Iron deficiency anemia 01/30/2009  . Essential hypertension 01/30/2009  . VERTIGO 01/30/2009  . Chronic kidney disease, stage III (moderate) (HCC) 01/30/2009    Orientation RESPIRATION BLADDER Height &  Weight     Self, Time, Place, Situation  Normal Incontinent Weight: 130 lb 12.8 oz (59.3 kg) Height:     BEHAVIORAL SYMPTOMS/MOOD NEUROLOGICAL BOWEL NUTRITION STATUS  Other (Comment)(None) (None) Continent Diet (Heart healthy, soft)  AMBULATORY STATUS COMMUNICATION OF NEEDS Skin     Verbally Skin abrasions                       Personal Care Assistance Level of Assistance              Functional Limitations Info  Sight, Hearing, Speech Sight Info: Adequate Hearing Info: Impaired(Previous diagnoses note deafness.) Speech Info: Adequate    SPECIAL CARE FACTORS FREQUENCY                       Contractures Contractures Info: Not present    Additional Factors Info  Code Status, Allergies Code Status Info: DNR Allergies Info: Aleve (Naproxen), Indocin (Indomethacin)           Current Medications (04/25/2017):  This is the current hospital active medication list Current Facility-Administered Medications  Medication Dose Route Frequency Provider Last Rate Last Dose  . acetaminophen (TYLENOL) tablet 650 mg  650 mg Oral Q6H PRN Haydee Salter, MD       Or  . acetaminophen (TYLENOL) suppository 650 mg  650 mg Rectal Q6H PRN Haydee Salter, MD      . benzocaine (ORAJEL) 10 % mucosal gel 1 application  1 application Mouth/Throat PRN Haydee Salter, MD      . feeding supplement (BOOST / RESOURCE BREEZE) liquid 1 Container  1 Container  Oral TID BM Leatha GildingGherghe, Costin M, MD   1 Container at 04/25/17 1114  . finasteride (PROSCAR) tablet 5 mg  5 mg Oral Daily Haydee SalterHobbs, Phillip M, MD   5 mg at 04/25/17 1022  . heparin injection 5,000 Units  5,000 Units Subcutaneous Q8H Haydee SalterHobbs, Phillip M, MD   5,000 Units at 04/25/17 [id number]  . hydrALAZINE (APRESOLINE) injection 10 mg  10 mg Intravenous Q8H PRN Haydee SalterHobbs, Phillip M, MD      . levothyroxine (SYNTHROID, LEVOTHROID) tablet 50 mcg  50 mcg Oral QAC breakfast Haydee SalterHobbs, Phillip M, MD   50 mcg at 04/25/17 0827  . lidocaine (LIDODERM) 5 % 1  patch  1 patch Transdermal Q24H Haydee SalterHobbs, Phillip M, MD   1 patch at 04/25/17 1113  . ondansetron (ZOFRAN) tablet 4 mg  4 mg Oral Q6H PRN Haydee SalterHobbs, Phillip M, MD       Or  . ondansetron [facility](ZOFRAN) injection 4 mg  4 mg Intravenous Q6H PRN Haydee SalterHobbs, Phillip M, MD      . pantoprazole (PROTONIX) injection 40 mg  40 mg Intravenous QHS Haydee SalterHobbs, Phillip M, MD   40 mg at 04/24/17 2049  . sodium chloride (OCEAN) 0.65 % nasal spray 1 spray  1 spray Each Nare TID PRN Haydee SalterHobbs, Phillip M, MD         Discharge Medications: TAKE these medications   ASPERCREME LIDOCAINE 4 % Ptch Generic drug:  Lidocaine Apply 1 patch topically See admin instructions. 1 patch applied once a day to mid lower back   benzocaine 10 % mucosal gel Commonly known as:  ORAJEL Use as directed 1 application in the mouth or throat as needed for mouth pain.   bisacodyl 10 MG/30ML Enem Commonly known as:  FLEET Place 10 mg rectally once as needed (if hard stool is present).   DEXTRAN 70-HYPROMELLOSE (PF) OP Place 1 drop into both eyes as needed (for dry eyes).   feeding supplement (BOOST BREEZE) Liqd Take 1 Can by mouth 2 (two) times daily between meals.   finasteride 5 MG tablet Commonly known as:  PROSCAR Take 5 mg by mouth daily.   fluocinonide 0.05 % external solution Commonly known as:  LIDEX Apply 1 application topically as needed (for itchy scalp).   gabapentin 100 MG capsule Commonly known as:  NEURONTIN Take 100 mg by mouth daily with supper.   hydrocortisone 1 % lotion Apply 1 application topically 2 (two) times daily as needed (for itching to rash on back).   ketoconazole 2 % shampoo Commonly known as:  NIZORAL Apply 1 application topically every Tuesday. SCALP   levothyroxine 50 MCG tablet Commonly known as:  SYNTHROID, LEVOTHROID Take 50 mcg by mouth daily before breakfast.   losartan 25 MG tablet Commonly known as:  COZAAR Take 25 mg by mouth daily.   magic mouthwash w/lidocaine Soln Take 5-10  mLs by mouth every 4 (four) hours as needed for mouth pain.   magnesium hydroxide 400 MG/5ML suspension Commonly known as:  MILK OF MAGNESIA Take 30 mLs by mouth once as needed for mild constipation.   MINTOX 200-200-20 MG/5ML suspension Generic drug:  alum & mag hydroxide-simeth Take 30 mLs by mouth every 4 (four) hours as needed for indigestion or heartburn (for 48 hours and call MD if symptoms persist).   omeprazole 20 MG capsule Commonly known as:  PRILOSEC Take 20 mg by mouth daily.   oxybutynin 5 MG 24 hr tablet Commonly known as:  DITROPAN-XL Take 5 mg by mouth daily.  oxyCODONE 5 MG immediate release tablet Commonly known as:  Oxy IR/ROXICODONE Take one tablet by mouth twice daily as needed for breakthrough pain What changed:    how much to take  how to take this  when to take this  reasons to take this  additional instructions   oxyCODONE 10 mg 12 hr tablet Commonly known as:  OXYCONTIN Take one tablet by mouth every morning for pain. Do not crush or chew. What changed:    how much to take  how to take this  when to take this  additional instructions   polyethylene glycol packet Commonly known as:  MIRALAX / GLYCOLAX Take 17 g by mouth daily.   sennosides-docusate sodium 8.6-50 MG tablet Commonly known as:  SENOKOT-S Take 2 tablets by mouth at bedtime.   SLEEPRIGHT BREATHE AID Misc 1 strip at bedtime.   sodium chloride 0.65 % Soln nasal spray Commonly known as:  OCEAN Place 1 spray into both nostrils 3 (three) times daily as needed for congestion.   triamcinolone 0.1 % paste Commonly known as:  KENALOG Apply a thin layer to affected area 3-6 times daily (dental)   TUSSIN DM 10-100 MG/5ML liquid Generic drug:  dextromethorphan-guaiFENesin Take 5 mLs by mouth every 4 (four) hours as needed for cough.   Vitamin D3 2000 units Tabs Take 2,000 Units by mouth daily.      Relevant Imaging Results:  Relevant Lab  Results:   Additional Information SS#: [id number]  Margarito Liner, LCSW

## 2017-04-25 NOTE — Clinical Social Work Note (Signed)
Clinical Social Work Assessment  Patient Details  Name: Ricardo [name] MRN: [id number] Date of Birth: 01/29/1914  Date of referral:  04/25/17               Reason for consult:  Discharge Planning                Permission sought to share information with:  Facility Sport and exercise psychologist, Family Supports Permission granted to share information::  Yes, Verbal Permission Granted  Name::        Agency::  [facility]  Relationship::  Daughter at bedside  Contact Information:     Housing/Transportation Living arrangements for the past 2 months:  [place] of Information:  Patient, Medical Team, Adult Children Patient Interpreter Needed:  None Criminal Activity/Legal Involvement Pertinent to Current Situation/Hospitalization:  No - Comment as needed Significant Relationships:  Adult Children, Other Family Members Lives with:  Facility Resident Do you feel safe going back to the place where you live?  Yes Need for family participation in patient care:  Yes (Comment)  Care giving concerns:  Patient is a resident at [facility].   Social Worker assessment / plan:  CSW met with patient. Daughter at bedside. CSW introduced role and explained that discharge planning would be discussed. Patient's daughter confirmed patient is from [facility] and plan is to return there today. ALF has confirmed he can return. No further concerns. CSW encouraged patient and his daughter to contact CSW as needed.  Employment status:  Retired Nurse, adult PT Recommendations:  Not assessed at this time Information / Referral to community resources:  Other (Comment Required)(Plan is to return to ALF today.)  Patient/Family's Response to care:  Patient and his daughter confirmed plan for discharge back to ALF today. Patient's family supportive and involved in patient's care. Patient and his daughter appreciated social work  intervention.  Patient/Family's Understanding of and Emotional Response to Diagnosis, Current Treatment, and Prognosis:  Patient and his daughter have a good understanding of the reason for admission and plan for discharge today. Patient and his daughter are eager to discharge back to ALF today.  Emotional Assessment Appearance:  Appears stated age Attitude/Demeanor/Rapport:  Gracious, Engaged Affect (typically observed):  Accepting, Appropriate, Calm, Pleasant Orientation:  Oriented to Self, Oriented to Place, Oriented to  Time, Oriented to Situation Alcohol / Substance use:  Never Used Psych involvement (Current and /or in the community):  No (Comment)  Discharge Needs  Concerns to be addressed:  Care Coordination Readmission within the last 30 days:  Yes Current discharge risk:  None Barriers to Discharge:  No Barriers Identified   Candie Chroman, LCSW 04/25/2017, 12:24 PM

## 2017-04-25 NOTE — Clinical Social Work Note (Signed)
CSW facilitated patient discharge including contacting patient family and facility to confirm patient discharge plans. Clinical information faxed to facility and family agreeable with plan. CS[facility] RN to call report prior to discharge [phone]).  CSW will sign off for now as social work intervention is no longer needed. Please consult us again if new needs arise.  Charlynn CourtSarah Mekisha Bittel, CSW [phone]

## 2017-04-25 NOTE — Progress Notes (Signed)
[facility] via [facility].

## 2017-04-27 ENCOUNTER — Telehealth: Payer: Self-pay

## 2017-04-27 NOTE — Telephone Encounter (Signed)
I have made the 1st attempt to contact the patient or family member in charge, in order to follow up from recently being discharged from the hospital. I left a message on voicemail but I will make another attempt at a different time.  

## 2017-04-27 NOTE — Telephone Encounter (Signed)
I have made the 2nd attempt to contact the patient or family member in charge, in order to follow up from recently being discharged from the hospital. I left a message on voicemail but I will make another attempt at a different time.  

## 2017-05-04 ENCOUNTER — Non-Acute Institutional Stay: Payer: Medicare PPO | Admitting: Family

## 2017-05-04 ENCOUNTER — Encounter: Payer: Self-pay | Admitting: Family

## 2017-05-04 DIAGNOSIS — K5909 Other constipation: Secondary | ICD-10-CM

## 2017-05-04 NOTE — Progress Notes (Signed)
Location:  [facility] Room Number: 32 Place of Service:  ALF [phone] Provider: Dinah Ngetich FNP-C  Oneal GroutPandey, Mahima, MD  Patient Care Team: Oneal GroutPandey, Mahima, MD as PCP - General (Internal Medicine) Ngetich, Donalee Citrininah C, NP as Nurse Practitioner (Family Medicine)  Extended Emergency Contact Information Primary Emergency Contact: [name],Phillip          [employer] Home Phone: [phone] Mobile Phone: [phone] Relation: Son Secondary Emergency Contact: [name],[name]  [place] Mobile Phone: [phone] Relation: Grandson  Code Status:  DNR Goals of care: Advanced Directive information Advanced Directives 05/04/2017  Does Patient Have a Medical Advance Directive? Yes  Type of Estate agentAdvance Directive Healthcare Power of [place];Out of facility DNR (pink MOST or yellow form);Living will  Does patient want to make changes to medical advance directive? -  Copy of Healthcare Power of Attorney in Chart? Yes  Pre-existing out of facility DNR order (yellow form or pink MOST form) Yellow form placed in chart (order not valid for inpatient use);Pink MOST form placed in chart (order not valid for inpatient use)     Chief Complaint  Patient presents with  . Acute Visit    constipation--recent bowel obstruction    HPI:  Pt is a [age] y.o. male [facility] for evaluation of constipation. He is seen in his room today with facility Nurse at bedside.Nurse reports patient had no bowel movement for 3-4 days.Dulcolax suppository was administered with small BM results.On call provider was notified and his miralax 6517 Gm was changed from every other day to daily.He states no bowel movement since previous day. He was recently in the hospital due to constipation.he states has a very good appetite and has been trying to drink more fluid.    Past Medical History:  Diagnosis Date  . AAA (abdominal aortic  aneurysm) (HCC)   . Abnormality of gait 08/01/2010  . Adhesive capsulitis of shoulder 08/01/2010   "Frozen" right shoulder  . Anemia   . Anxiety state, unspecified 08/01/2010  . Arthritis   . Congestive heart failure, unspecified 08/01/2010  . Cough 08/07/2015  . Disorders of bursae and tendons in shoulder region, unspecified 08/01/2010  . Edema 08/01/2010  . Fall   . Fecal impaction (HCC)   . GERD (gastroesophageal reflux disease)   . Hiatal hernia   . Hypercalcemia   . Hyperlipidemia   . Hypertension   . Hypertrophy of prostate without urinary obstruction and other lower urinary tract symptoms (LUTS) 08/01/2010  . Hyponatremia   . Hypothyroidism   . Infectious colitis   . Insomnia, unspecified 08/01/2010  . Joint pain   . Leg pain    with walking  . Lumbago 12/24/2010  . Orthostatic hypotension 11/05/2010  . Other specified cardiac dysrhythmias(427.89) 12/24/2010  . Pain in joint, pelvic region and thigh 08/06/2010  . Peptic ulcer, unspecified site, unspecified as acute or chronic, without mention of hemorrhage, perforation, or obstruction 08/01/2010  . Renal failure, acute (HCC)   . Swelling, mass, or lump in head and neck 01/03/2011  . Ulcer    Stomach  . Unspecified disorder of kidney and ureter 08/01/2010  . Unspecified hearing loss 08/01/2010  . Unspecified vitamin D deficiency 08/06/2010  . Urinary frequency 09/13/2010  . Vertigo    Past Surgical History:  Procedure Laterality Date  . ABDOMINAL AORTIC ANEURYSM REPAIR  12-18-10   Stent graft repair of AAA  . APPENDECTOMY    .  CATARACT EXTRACTION    . HEMORRHOID SURGERY  2000  . INGUINAL HERNIA REPAIR Left 2005  . TRANSTHORACIC ECHOCARDIOGRAM  07/19/2010    Left ventricle: There is hypokinesis of the inferior wall and  posterior wall. The EF is 35-40%. The cavity size was normal    Allergies  Allergen Reactions  . Aleve [Naproxen] Other (See Comments)    Per MAR  . Indocin [Indomethacin] Other (See Comments)    Per [facility]      Outpatient Encounter Medications as of 05/04/2017  Medication Sig  . alum & mag hydroxide-simeth (MINTOX) 200-200-20 MG/5ML suspension Take 30 mLs by mouth every 4 (four) hours as needed for indigestion or heartburn (for 48 hours and call MD if symptoms persist).   . benzocaine (ORAJEL) 10 % mucosal gel Use as directed 1 application in the mouth or throat as needed for mouth pain.  . bisacodyl (FLEET) 10 MG/30ML ENEM Place 10 mg rectally once as needed (if hard stool is present).  . Cholecalciferol (VITAMIN D3) 2000 units TABS Take 2,000 Units by mouth daily.  Marland Kitchen DEXTRAN 70-HYPROMELLOSE, PF, OP Place 1 drop into both eyes as needed (for dry eyes).  Marland Kitchen dextromethorphan-guaiFENesin (TUSSIN DM) 10-100 MG/5ML liquid Take 5 mLs by mouth every 4 (four) hours as needed for cough.  . finasteride (PROSCAR) 5 MG tablet Take 5 mg by mouth daily.   . fluocinonide (LIDEX) 0.05 % external solution Apply 1 application topically as needed (for itchy scalp).   . gabapentin (NEURONTIN) 100 MG capsule Take 100 mg by mouth daily with supper.   . hydrocortisone 1 % lotion Apply 1 application topically 2 (two) times daily as needed (for itching to rash on back).   Marland Kitchen ketoconazole (NIZORAL) 2 % shampoo Apply 1 application topically every Tuesday. SCALP  . levothyroxine (SYNTHROID, LEVOTHROID) 50 MCG tablet Take 50 mcg by mouth daily before breakfast.   . Lidocaine (ASPERCREME LIDOCAINE) 4 % PTCH Apply 1 patch topically See admin instructions. 1 patch applied once a day to mid lower back  . losartan (COZAAR) 25 MG tablet Take 25 mg by mouth daily.   . magic mouthwash w/lidocaine SOLN Take 5-10 mLs by mouth every 4 (four) hours as needed for mouth pain.  . magnesium hydroxide (MILK OF MAGNESIA) 400 MG/5ML suspension Take 30 mLs by mouth once as needed for mild constipation.  . Misc. Devices (SLEEPRIGHT BREATHE AID) MISC 1 strip at bedtime.  . Nutritional Supplements (FEEDING SUPPLEMENT, BOOST BREEZE,) LIQD Take 1 Can by  mouth 2 (two) times daily between meals.  Marland Kitchen omeprazole (PRILOSEC) 20 MG capsule Take 20 mg by mouth daily.  Marland Kitchen oxybutynin (DITROPAN-XL) 5 MG 24 hr tablet Take 5 mg by mouth daily.   Marland Kitchen oxyCODONE (OXY IR/ROXICODONE) 5 MG immediate release tablet Take one tablet by mouth twice daily as needed for breakthrough pain  . oxyCODONE (OXYCONTIN) 10 mg 12 hr tablet Take one tablet by mouth every morning for pain. Do not crush or chew.  . polyethylene glycol (MIRALAX / GLYCOLAX) packet Take 17 g by mouth every other day.   . sodium chloride (OCEAN) 0.65 % SOLN nasal spray Place 1 spray into both nostrils 3 (three) times daily as needed for congestion.  . triamcinolone (KENALOG) 0.1 % paste Apply a thin layer to affected area every 4 hours or 3-6 times daily (dental) as needed  . [DISCONTINUED] sennosides-docusate sodium (SENOKOT-S) 8.6-50 MG tablet Take 2 tablets by mouth at bedtime.    No facility-administered encounter medications on  file as of 05/04/2017.     Review of Systems  Constitutional: Negative for appetite change, chills, fatigue and fever.  Respiratory: Negative for cough, chest tightness, shortness of breath and wheezing.   Cardiovascular: Negative for chest pain, palpitations and leg swelling.  Gastrointestinal: Negative for abdominal pain, blood in stool, constipation, diarrhea, nausea and vomiting.  Musculoskeletal: Positive for arthralgias and gait problem.  Skin: Negative for color change, pallor and rash.  Psychiatric/Behavioral: Negative for agitation, confusion and sleep disturbance. The patient is not nervous/anxious.     Immunization History  Administered Date(s) Administered  . Influenza-Unspecified 12/27/2010, 12/29/2011, 12/27/2012, 12/13/2013, 12/14/2014, 12/20/2015, 12/17/2016  . PPD Test 07/30/2010, 08/14/2010  . Pneumococcal Conjugate-13 11/04/2016  . Pneumococcal Polysaccharide-23 03/10/2005  . Td 03/11/1999  . Tdap 10/31/2016   Pertinent  Health Maintenance Due    Topic Date Due  . INFLUENZA VACCINE  Completed  . PNA vac Low Risk Adult  Completed   Fall Risk  10/29/2016 08/07/2015 08/07/2015 04/10/2015 03/27/2015  Falls in the past year? Yes Yes No No No  Comment - 06/02/15- skin tear right arm - - -  Number falls in past yr: 1 - - - -  Injury with Fall? No - - - -    Vitals:   05/04/17 1059  BP: 112/64  Pulse: (!) 58  Resp: 16  Temp: (!) 97.4 F (36.3 C)  SpO2: 99%  Weight: 131 lb (59.4 kg)  Height: 5\' 9"  (1.753 m)   Body mass index is 19.35 kg/m. Physical Exam  Constitutional: He is oriented to person, place, and time.  Frail elderly in no acute distress   HENT:  Head: Normocephalic.  Mouth/Throat: Oropharynx is clear and moist. No oropharyngeal exudate.  Eyes: Conjunctivae and EOM are normal. Pupils are equal, round, and reactive to light. Right eye exhibits no discharge. Left eye exhibits no discharge. No scleral icterus.  Eye glasses in place   Neck: Normal range of motion. No JVD present. No thyromegaly present.  Cardiovascular: Exam reveals no gallop and no friction rub.  Murmur heard. Pulmonary/Chest: Effort normal and breath sounds normal. No respiratory distress. He has no wheezes. He has no rales.  Abdominal: Soft. Bowel sounds are normal. He exhibits no distension and no mass. There is no tenderness. There is no rebound and no guarding.  Musculoskeletal: He exhibits no edema or tenderness.  Unsteady gait uses FWW for short distances and scooter for long distances.  Lymphadenopathy:    He has no cervical adenopathy.  Neurological: He is oriented to person, place, and time. Coordination normal.  Skin: Skin is warm and dry. No rash noted. No erythema. No pallor.  Psychiatric: He has a normal mood and affect. His behavior is normal.   Labs reviewed: Recent Labs    04/23/17 1231 04/24/17 0649 04/25/17 0410  NA 137 136 135  K 4.3 4.7 4.1  CL 103 103 103  CO2 23 24 24   GLUCOSE 135* 106* 95  BUN 23* 23* 20  CREATININE  1.15 1.13 1.10  CALCIUM 9.1 8.7* 8.4*  MG  --  2.2  --   PHOS  --  3.5  --    Recent Labs    04/06/17 0821 04/16/17 04/23/17 1231  AST 24 16 23   ALT 11* 7 11*  ALKPHOS 75 87 105  BILITOT 0.8 0.6 1.1  PROT 5.6* 6.1 7.0  ALBUMIN 3.0* 3.6 3.8   Recent Labs    04/05/17 1846  04/23/17 1231 04/24/17 0649 04/25/17 0410  WBC 7.5   < > 8.2 6.4 6.7  NEUTROABS 6.3  --  7.0  --   --   HGB 12.0*   < > 11.4* 11.1* 10.6*  HCT 36.6*   < > 35.1* 34.0* 32.1*  MCV 88.6   < > 88.9 89.7 89.4  PLT 160   < > 147* 158 154   < > = values in this interval not displayed.   Lab Results  Component Value Date   TSH 0.936 04/06/2017   Lab Results  Component Value Date   HGBA1C 5.6 04/06/2017   Lab Results  Component Value Date   CHOL 145 04/06/2017   HDL 47 04/06/2017   LDLCALC 89 04/06/2017   TRIG 47 04/06/2017   CHOLHDL 3.1 04/06/2017    Significant Diagnostic Results in last 30 days:  Ct Abdomen Pelvis W Contrast  Result Date: 04/23/2017 CLINICAL DATA:  Abdominal pain and vomiting. EXAM: CT ABDOMEN AND PELVIS WITH CONTRAST TECHNIQUE: Multidetector CT imaging of the abdomen and pelvis was performed using the standard protocol following bolus administration of intravenous contrast. CONTRAST:  ISOVUE-300 IOPAMIDOL (ISOVUE-300) INJECTION 61% COMPARISON:  CT abdomen pelvis dated February 07, 2014. FINDINGS: Lower chest: Borderline aneurysmal ascending thoracic aorta, measuring 4.0 cm. Enlargement of the right main pulmonary artery, measuring up to 3.6 cm. Bibasilar atelectasis. Hepatobiliary: No focal liver abnormality. Cholelithiasis. No gallbladder wall thickening or biliary dilatation. Pancreas: Mild parenchymal atrophy. Prominence of the main pancreatic duct, measuring up to 4 mm proximally. Spleen: Normal in size without focal abnormality. Adrenals/Urinary Tract: Adrenal glands are unremarkable. Kidneys are normal, without renal calculi, focal lesion, or hydronephrosis. Bladder is mildly  distended. Stomach/Bowel: Large hiatal hernia. Multiple dilated loops of proximal to mid small bowel, with a transition point seen in the right anterior abdomen (series 3, image 50). Borderline dilated right and transverse colon containing a large amount of stool. The appendix is not visualized. Vascular/Lymphatic: Tortuous, atherosclerotic abdominal aorta, with large infrarenal abdominal aortic aneurysm status post stent graft. The excluded aneurysm sac as enlarged in size, now measuring 8.4 cm, previously 6.8 cm. Contrast within the excluded aneurysmal sac, consistent with type 2 endoleak, unchanged. No lymphadenopathy. Reproductive: Prostate is unremarkable. Other: No free fluid or pneumoperitoneum. Musculoskeletal: No acute or significant osseous findings. IMPRESSION: 1. Small bowel obstruction with transition point seen in the right anterior abdomen. 2. Patent bifurcated abdominal aortic stent graft with persistent type 2 endoleak and continued enlargement of the infrarenal abdominal aortic aneurysm, now measuring 8.4 cm, previously 6.8 cm. Vascular surgery consultation recommended due to increased risk of rupture for AAA >5.5 cm. This recommendation follows ACR consensus guidelines: White Paper of the ACR Incidental Findings Committee II on Vascular Findings. J Am Coll Radiol 2013; 10:789-794. 3. Borderline aneurysmal ascending thoracic aorta, measuring 4.0 cm. Recommend annual imaging followup by CTA or MRA. This recommendation follows 2010 ACCF/AHA/AATS/ACR/ASA/SCA/SCAI/SIR/STS/SVM Guidelines for the Diagnosis and Management of Patients with Thoracic Aortic Disease. Circulation. 2010; 121: Z610-R604 4. Cholelithiasis. Electronically Signed   By: Obie Dredge M.D.   On: 04/23/2017 14:49   Dg Abd Portable 2v  Result Date: 04/24/2017 CLINICAL DATA:  Abdominal pain. EXAM: PORTABLE ABDOMEN - 2 VIEW COMPARISON:  CT 04/23/2017 FINDINGS: Again noted is an abdominal aortic stent graft. Contrast within the  urinary bladder from recent CT examination. Nonobstructive bowel gas pattern. No evidence for free air on the decubitus image. Multiple phleboliths in the pelvis. Again noted is colonic stool in the upper abdomen. Scoliosis and multilevel degenerative  disease in the lumbar spine. IMPRESSION: No acute abnormality. Electronically Signed   By: Richarda Overlie M.D.   On: 04/24/2017 08:15   Ct Angio Chest/abd/pel For Dissection W And/or Wo Contrast  Result Date: 04/05/2017 CLINICAL DATA:  Right lower quadrant pain and nausea beginning last night. Palpable abdominal mass. Previous abdominal aortic aneurysm repair and diverticulosis. EXAM: CT ANGIOGRAPHY CHEST, ABDOMEN AND PELVIS TECHNIQUE: Multidetector CT imaging through the chest, abdomen and pelvis was performed using the standard protocol during bolus administration of intravenous contrast. Multiplanar reconstructed images and MIPs were obtained and reviewed to evaluate the vascular anatomy. CONTRAST:  ISOVUE-370 IOPAMIDOL (ISOVUE-370) INJECTION 76% COMPARISON:  AP CTA on 02/07/2014; no prior chest CTA FINDINGS: CTA CHEST FINDINGS Cardiovascular: Moderate cardiomegaly, without pericardial effusion. Aortic and coronary artery atherosclerosis. Aneurysm of the ascending aorta is seen measuring 4.3 cm. No evidence of aortic dissection. Mediastinum/Nodes: No masses or pathologically enlarged lymph nodes identified. Large hiatal hernia. Lungs/Pleura: No pulmonary mass, infiltrate, or effusion. Upper abdomen: No acute findings. Musculoskeletal: No suspicious bone lesions identified. Review of the MIP images confirms the above findings. CTA ABDOMEN AND PELVIS FINDINGS VASCULAR Aorta: Aorto bi-iliac stent graft remains in place. The native abdominal aortic aneurysm is measures 8.4 cm in maximum diameter compared to 6.7 cm in 2015. No evidence of endoleak. No evidence of retroperitoneal hemorrhage. Celiac: Patent without evidence of aneurysm, dissection, vasculitis or  significant stenosis. SMA: Patent without evidence of aneurysm, dissection, vasculitis or significant stenosis. Renals: Both renal arteries are patent without evidence of aneurysm, dissection, vasculitis, fibromuscular dysplasia or significant stenosis. IMA: Patent without evidence of aneurysm, dissection, vasculitis or significant stenosis. Inflow: Patent without evidence of aneurysm, dissection, vasculitis or significant stenosis. Veins: No obvious venous abnormality within the limitations of this arterial phase study. Review of the MIP images confirms the above findings. NON-VASCULAR Hepatobiliary: Suboptimal visualization due to beam hardening artifact from contrast bolus in the heart and aorta, but no definite hepatic abnormality identified. Small calcified gallstone noted, without evidence of cholecystitis. Pancreas:  No mass or inflammatory changes. Spleen: Within normal limits in size and appearance. Adrenals/Urinary Tract: No masses identified. No evidence of hydronephrosis. Stomach/Bowel: No evidence of obstruction, inflammatory process or abnormal fluid collections. Large hiatal hernia. Diffuse gaseous distention of the colon noted, without evidence of obstruction, highly suspicious for colonic ileus. Vascular/Lymphatic: No pathologically enlarged lymph nodes. No abdominal aortic aneurysm. Reproductive:  No mass or other significant abnormality. Other:  None. Musculoskeletal:  No suspicious bone lesions identified. Review of the MIP images confirms the above findings. IMPRESSION: 4.3 cm ascending thoracic aortic aneurysm. No evidence of aortic dissection. Previous stent graft repair of abdominal aortic aneurysm. Native aneurysm sac now measures 8.4 cm in maximum diameter, compared to 6.7 cm in 2015. No evidence of endoleak or retroperitoneal hemorrhage. Stable large hiatal hernia. Probable colonic ileus. Cholelithiasis.  No radiographic evidence of cholecystitis. Electronically Signed   By: Myles Rosenthal  M.D.   On: 04/05/2017 20:00   Assessment/Plan  Chronic constipation Had dulcolax supp with small BM  Results.Enema given was effective.On call provider order miralax changed to daily. Has had no BM since over the weekend. Change miralax 17 gm pakt to one by mouth twice daily.facility Nurse to document BM status every shift.encourage oral and fluid intake.   Family/ staff Communication: Reviewed plan of care with patient and facility Nurse.   Labs/tests ordered: None   Dinah C Ngetich, NP

## 2017-05-06 ENCOUNTER — Encounter (HOSPITAL_COMMUNITY): Payer: Self-pay | Admitting: Emergency Medicine

## 2017-05-06 ENCOUNTER — Encounter: Payer: Self-pay | Admitting: Internal Medicine

## 2017-05-06 ENCOUNTER — Other Ambulatory Visit: Payer: Self-pay

## 2017-05-06 ENCOUNTER — Emergency Department (HOSPITAL_COMMUNITY): Payer: Medicare PPO

## 2017-05-06 ENCOUNTER — Emergency Department (HOSPITAL_COMMUNITY)
Admission: EM | Admit: 2017-05-06 | Discharge: 2017-05-07 | Disposition: A | Discharge status: Skilled Nursing Facility | Payer: Medicare PPO | Attending: Emergency Medicine | Admitting: Emergency Medicine

## 2017-05-06 ENCOUNTER — Non-Acute Institutional Stay: Payer: Medicare PPO | Admitting: Internal Medicine

## 2017-05-06 VITALS — BP 120/70 | HR 68 | Temp 97.6°F | Resp 16 | Ht 69.0 in | Wt 137.2 lb

## 2017-05-06 DIAGNOSIS — K449 Diaphragmatic hernia without obstruction or gangrene: Secondary | ICD-10-CM | POA: Diagnosis not present

## 2017-05-06 DIAGNOSIS — I10 Essential (primary) hypertension: Secondary | ICD-10-CM | POA: Insufficient documentation

## 2017-05-06 DIAGNOSIS — K5900 Constipation, unspecified: Secondary | ICD-10-CM | POA: Insufficient documentation

## 2017-05-06 DIAGNOSIS — I714 Abdominal aortic aneurysm, without rupture, unspecified: Secondary | ICD-10-CM

## 2017-05-06 DIAGNOSIS — E039 Hypothyroidism, unspecified: Secondary | ICD-10-CM | POA: Diagnosis not present

## 2017-05-06 DIAGNOSIS — K56609 Unspecified intestinal obstruction, unspecified as to partial versus complete obstruction: Secondary | ICD-10-CM | POA: Diagnosis not present

## 2017-05-06 DIAGNOSIS — Z79899 Other long term (current) drug therapy: Secondary | ICD-10-CM | POA: Insufficient documentation

## 2017-05-06 DIAGNOSIS — Q438 Other specified congenital malformations of intestine: Secondary | ICD-10-CM | POA: Diagnosis not present

## 2017-05-06 DIAGNOSIS — I509 Heart failure, unspecified: Secondary | ICD-10-CM | POA: Insufficient documentation

## 2017-05-06 DIAGNOSIS — Z9189 Other specified personal risk factors, not elsewhere classified: Secondary | ICD-10-CM | POA: Diagnosis not present

## 2017-05-06 DIAGNOSIS — IMO0001 Reserved for inherently not codable concepts without codable children: Secondary | ICD-10-CM

## 2017-05-06 DIAGNOSIS — F1120 Opioid dependence, uncomplicated: Secondary | ICD-10-CM | POA: Diagnosis not present

## 2017-05-06 DIAGNOSIS — Z789 Other specified health status: Secondary | ICD-10-CM

## 2017-05-06 DIAGNOSIS — K5909 Other constipation: Secondary | ICD-10-CM

## 2017-05-06 DIAGNOSIS — R109 Unspecified abdominal pain: Secondary | ICD-10-CM | POA: Diagnosis not present

## 2017-05-06 DIAGNOSIS — K631 Perforation of intestine (nontraumatic): Secondary | ICD-10-CM | POA: Diagnosis not present

## 2017-05-06 LAB — COMPREHENSIVE METABOLIC PANEL
ALBUMIN: 3.4 g/dL — AB (ref 3.5–5.0)
ALT: 13 U/L — AB (ref 17–63)
AST: 19 U/L (ref 15–41)
Alkaline Phosphatase: 96 U/L (ref 38–126)
Anion gap: 9 (ref 5–15)
BUN: 27 mg/dL — AB (ref 6–20)
CHLORIDE: 105 mmol/L (ref 101–111)
CO2: 22 mmol/L (ref 22–32)
CREATININE: 1.08 mg/dL (ref 0.61–1.24)
Calcium: 8.5 mg/dL — ABNORMAL LOW (ref 8.9–10.3)
GFR calc Af Amer: >60 mL/min (ref >60)
GFR calc non Af Amer: 54 mL/min — ABNORMAL LOW (ref >60)
GLUCOSE: 93 mg/dL (ref 65–99)
Potassium: 4.8 mmol/L (ref 3.5–5.1)
SODIUM: 136 mmol/L (ref 135–145)
Total Bilirubin: 0.6 mg/dL (ref 0.3–1.2)
Total Protein: 6.3 g/dL — ABNORMAL LOW (ref 6.5–8.1)

## 2017-05-06 LAB — CBC WITH DIFFERENTIAL/PLATELET
BASOS ABS: 0 10*3/uL (ref 0.0–0.1)
BASOS PCT: 0 %
EOS ABS: 0.3 10*3/uL (ref 0.0–0.7)
EOS PCT: 4 %
HCT: 29.3 % — ABNORMAL LOW (ref 39.0–52.0)
Hemoglobin: 9.6 g/dL — ABNORMAL LOW (ref 13.0–17.0)
LYMPHS PCT: 30 %
Lymphs Abs: 1.8 10*3/uL (ref 0.7–4.0)
MCH: 29.1 pg (ref 26.0–34.0)
MCHC: 32.8 g/dL (ref 30.0–36.0)
MCV: 88.8 fL (ref 78.0–100.0)
Monocytes Absolute: 0.9 10*3/uL (ref 0.1–1.0)
Monocytes Relative: 15 %
Neutro Abs: 3 10*3/uL (ref 1.7–7.7)
Neutrophils Relative %: 51 %
PLATELETS: 208 10*3/uL (ref 150–400)
RBC: 3.3 MIL/uL — AB (ref 4.22–5.81)
RDW: 13.5 % (ref 11.5–15.5)
WBC: 5.8 10*3/uL (ref 4.0–10.5)

## 2017-05-06 LAB — TSH: TSH: 3.131 u[IU]/mL (ref 0.350–4.500)

## 2017-05-06 LAB — LIPASE, BLOOD: Lipase: 24 U/L (ref 11–51)

## 2017-05-06 MED ORDER — SODIUM CHLORIDE 0.9 % IJ SOLN
INTRAMUSCULAR | Status: AC
  Administered 2017-05-07: 01:00:00
  Filled 2017-05-06: qty 50

## 2017-05-06 MED ORDER — IOPAMIDOL (ISOVUE-300) INJECTION 61%
INTRAVENOUS | Status: AC
  Administered 2017-05-06: 100 mL
  Filled 2017-05-06: qty 100

## 2017-05-06 MED ORDER — SODIUM CHLORIDE 0.9 % IV BOLUS (SEPSIS)
1000.0000 mL | Freq: Once | INTRAVENOUS | Status: AC
  Administered 2017-05-06: 1000 mL via INTRAVENOUS

## 2017-05-06 MED ORDER — OXYCODONE HCL ER 10 MG PO T12A: 10.0000 mg | EXTENDED_RELEASE_TABLET | Freq: Every day | ORAL | 0 refills | Status: DC

## 2017-05-06 MED ORDER — ACETAMINOPHEN 500 MG PO TABS: ORAL_TABLET | ORAL | 0 refills | Status: DC

## 2017-05-06 NOTE — Progress Notes (Signed)
[facility]  Provider: Oneal Grout MD   Location:  [facility]   Place of Service:  Clinic (12)  PCP: Oneal Grout, MD Patient Care Team: Oneal Grout, MD as PCP - General (Internal Medicine) Ngetich, Donalee Citrin, NP as Nurse Practitioner (Family Medicine)  Extended Emergency Contact Information Primary Emergency Contact: [name], [place] Home Phone: [phone] Mobile Phone: [phone] Relation: Son Secondary Emergency Contact: [name] Mobile Phone: [phone] Relation: Grandson  Code Status: DNR  Goals of Care: Advanced Directive information Advanced Directives 05/06/2017  Does Patient Have a Medical Advance Directive? -  Type of Estate agent of [place];Living will;Out of facility DNR (pink MOST or yellow form)  Does patient want to make changes to medical advance directive? No - Patient declined  Copy of Healthcare Power of Attorney in Chart? Yes  Pre-existing out of facility DNR order (yellow form or pink MOST form) Yellow form placed in chart (order not valid for inpatient use);Pink MOST form placed in chart (order not valid for inpatient use)      Chief Complaint  Patient presents with  . Acute Visit    Patient stated that he hasn't had a bowel movement in 2 days. Patient stated that he was suppose to get a enema but hasn't received. Patient stated that he doesn't have any abdominal pain but if he sits on the toile to force himself to have a bowel movement that's when he starts to have pain.     HPI: Patient is a 84 y.o. male seen today for transition of care follow up visit. He was in the hospital from 04/23/17-04/25/17 with acute abdominal pain with radiation to his back. He had imaging of his abdomen ruling out AAA dissection but it showed small bowel obstruction. He was given bowel rest and placed on iv fluids. No NG tube was placed  per family request. Abdominal xray confirmed resolution of small bowel obstruction. He had loose stool prior to discharge from hospital. He was sent back to assisted living. He has medical history of AAA, ileus, CHF, HTN among others. He mentions being constipated. On 2/19, his miralax was changed to every other day on 04/28/17. Following this he did not have bowel movement and received dulcolax suppository on 05/02/17 followed by a small bowel movement the same day. No bowel movement since then. This is per charting from nurses notes in [facility]. Patient mentions that he has not moved his bowel for 4 days now. Denies abdominal pain, nausea or vomiting. He has been passing some flatus. He mentions that at baseline, he moves his bowel every 1-2 days. On medication review, he is on oxycodone 10 mg daily and oxyIR 5 mg bid prn. On chart review, he has asked for prn oxyIR 2 times since back from the hospital. He is on levothyroxine for hypothyroidism. He is on senokot s 2 tab qhs, miralax bid and fleet enema as needed.   Past Medical History:  Diagnosis Date  . AAA (abdominal aortic aneurysm) (HCC)   . Abnormality of gait 08/01/2010  . Adhesive capsulitis of shoulder 08/01/2010   "Frozen" right shoulder  . Anemia   . Anxiety state, unspecified 08/01/2010  . Arthritis   . Congestive heart failure, unspecified 08/01/2010  . Cough 08/07/2015  . Disorders of bursae and tendons in shoulder region, unspecified 08/01/2010  . Edema 08/01/2010  . Fall   .  Fecal impaction (HCC)   . GERD (gastroesophageal reflux disease)   . Hiatal hernia   . Hypercalcemia   . Hyperlipidemia   . Hypertension   . Hypertrophy of prostate without urinary obstruction and other lower urinary tract symptoms (LUTS) 08/01/2010  . Hyponatremia   . Hypothyroidism   . Infectious colitis   . Insomnia, unspecified 08/01/2010  . Joint pain   . Leg pain    with walking  . Lumbago 12/24/2010  . Orthostatic hypotension 11/05/2010  . Other  specified cardiac dysrhythmias(427.89) 12/24/2010  . Pain in joint, pelvic region and thigh 08/06/2010  . Peptic ulcer, unspecified site, unspecified as acute or chronic, without mention of hemorrhage, perforation, or obstruction 08/01/2010  . Renal failure, acute (HCC)   . Swelling, mass, or lump in head and neck 01/03/2011  . Ulcer    Stomach  . Unspecified disorder of kidney and ureter 08/01/2010  . Unspecified hearing loss 08/01/2010  . Unspecified vitamin D deficiency 08/06/2010  . Urinary frequency 09/13/2010  . Vertigo    Past Surgical History:  Procedure Laterality Date  . ABDOMINAL AORTIC ANEURYSM REPAIR  12-18-10   Stent graft repair of AAA  . APPENDECTOMY    . CATARACT EXTRACTION    . HEMORRHOID SURGERY  2000  . INGUINAL HERNIA REPAIR Left 2005  . TRANSTHORACIC ECHOCARDIOGRAM  07/19/2010    Left ventricle: There is hypokinesis of the inferior wall and  posterior wall. The EF is 35-40%. The cavity size was normal    reports that  has never smoked. he has never used smokeless tobacco. He reports that he drinks alcohol. He reports that he does not use drugs. Social History   Socioeconomic History  . Marital status: Widowed    Spouse name: Not on file  . Number of children: Not on file  . Years of education: Not on file  . Highest education level: Not on file  Social Needs  . Financial resource strain: Not on file  . Food insecurity - worry: Not on file  . Food insecurity - inability: Not on file  . Transportation needs - medical: Not on file  . Transportation needs - non-medical: Not on file  Occupational History  . Occupation: previous Network engineerowner of [employer]  Tobacco Use  . Smoking status: Never Smoker  . Smokeless tobacco: Never Used  Substance and Sexual Activity  . Alcohol use: Yes    Alcohol/week: 0.0 oz    Comment: 1 1/2 oz daily   . Drug use: No  . Sexual activity: No  Other Topics Concern  . Not on file  Social History Narrative   [facility]   Living Will, MOST, POA   Widowed   Exercises: no   Power wheelchair   Alcohol 1 1/2 oz daily   Never smoked             Functional Status Survey:    Family History  Problem Relation Age of Onset  . Heart failure Brother     Health Maintenance  Topic Date Due  . TETANUS/TDAP  11/01/2026  . INFLUENZA VACCINE  Completed  . PNA vac Low Risk Adult  Completed    Allergies  Allergen Reactions  . Aleve [Naproxen] Other (See Comments)    Per MAR  . Indocin [Indomethacin] Other (See Comments)    [facility]    Outpatient Encounter Medications as of 05/06/2017  Medication Sig  . alum &  mag hydroxide-simeth (MINTOX) 200-200-20 MG/5ML suspension Take 30 mLs by mouth every 4 (four) hours as needed for indigestion or heartburn (for 48 hours and call MD if symptoms persist).   . benzocaine (ORAJEL) 10 % mucosal gel Use as directed 1 application in the mouth or throat as needed for mouth pain.  . bisacodyl (DULCOLAX) 10 MG suppository Place 10 mg rectally as needed for moderate constipation.  . bisacodyl (FLEET) 10 MG/30ML ENEM Place 10 mg rectally once as needed (if hard stool is present).  . Cholecalciferol (VITAMIN D3) 2000 units TABS Take 2,000 Units by mouth daily.  Marland Kitchen DEXTRAN 70-HYPROMELLOSE, PF, OP Place 1 drop into both eyes as needed (for dry eyes).  Marland Kitchen dextromethorphan-guaiFENesin (TUSSIN DM) 10-100 MG/5ML liquid Take 5 mLs by mouth every 4 (four) hours as needed for cough.  . finasteride (PROSCAR) 5 MG tablet Take 5 mg by mouth daily.   . fluocinonide (LIDEX) 0.05 % external solution Apply 1 application topically as needed (for itchy scalp).   . gabapentin (NEURONTIN) 100 MG capsule Take 100 mg by mouth daily with supper.   . hydrocortisone 1 % lotion Apply 1 application topically 2 (two) times daily as needed (for itching to rash on back).   Marland Kitchen ketoconazole (NIZORAL) 2 % shampoo Apply 1 application topically every Tuesday. SCALP  .  levothyroxine (SYNTHROID, LEVOTHROID) 50 MCG tablet Take 50 mcg by mouth daily before breakfast.   . Lidocaine (ASPERCREME LIDOCAINE) 4 % PTCH Apply 1 patch topically See admin instructions. 1 patch applied once a day to mid lower back  . losartan (COZAAR) 25 MG tablet Take 25 mg by mouth daily.   . magic mouthwash w/lidocaine SOLN Take 5-10 mLs by mouth every 4 (four) hours as needed for mouth pain.  . Misc. Devices (SLEEPRIGHT BREATHE AID) MISC 1 strip at bedtime.  . Nutritional Supplements (FEEDING SUPPLEMENT, BOOST BREEZE,) LIQD Take 1 Can by mouth 2 (two) times daily between meals.  Marland Kitchen omeprazole (PRILOSEC) 20 MG capsule Take 20 mg by mouth daily.  Marland Kitchen oxybutynin (DITROPAN-XL) 5 MG 24 hr tablet Take 5 mg by mouth daily.   Marland Kitchen oxyCODONE (OXY IR/ROXICODONE) 5 MG immediate release tablet Take one tablet by mouth twice daily as needed for breakthrough pain  . oxyCODONE (OXYCONTIN) 10 mg 12 hr tablet Take one tablet by mouth every morning for pain. Do not crush or chew.  . polyethylene glycol (MIRALAX / GLYCOLAX) packet Take 17 g by mouth 2 (two) times daily.   . sennosides-docusate sodium (SENOKOT-S) 8.6-50 MG tablet Take 2 tablets by mouth at bedtime.  . sodium chloride (OCEAN) 0.65 % SOLN nasal spray Place 1 spray into both nostrils 3 (three) times daily as needed for congestion.  . triamcinolone (KENALOG) 0.1 % paste Apply a thin layer to affected area every 4 hours or 3-6 times daily (dental) as needed  . [DISCONTINUED] magnesium hydroxide (MILK OF MAGNESIA) 400 MG/5ML suspension Take 30 mLs by mouth once as needed for mild constipation.   No facility-administered encounter medications on file as of 05/06/2017.     Review of Systems  Constitutional: Negative for appetite change, chills and fever.       Tried and keeps himself hydrated  HENT: Negative for congestion.   Respiratory: Negative for shortness of breath.   Cardiovascular: Negative for chest pain.  Gastrointestinal: Positive for  constipation. Negative for abdominal pain, blood in stool, nausea and vomiting.  Genitourinary: Negative for dysuria.  Musculoskeletal: Positive for arthralgias and gait problem. Negative  for back pain.       Denies pain this visit. Unsteady gait  Neurological: Negative for dizziness and headaches.  Psychiatric/Behavioral: Negative for confusion.    Vitals:   05/06/17 0934  BP: 120/70  Pulse: 68  Resp: 16  Temp: 97.6 F (36.4 C)  TempSrc: Oral  SpO2: 97%  Weight: 137 lb 3.2 oz (62.2 kg)  Height: 5\' 9"  (1.753 m)   Body mass index is 20.26 kg/m. Physical Exam  Constitutional: He is oriented to person, place, and time.  Frail elderly male in no acute distress   HENT:  Head: Normocephalic and atraumatic.  Mouth/Throat: Oropharynx is clear and moist.  Eyes: Conjunctivae and EOM are normal. Right eye exhibits no discharge. Left eye exhibits no discharge.  Neck: Neck supple.  Cardiovascular: Normal rate and regular rhythm.  Pulmonary/Chest: Effort normal and breath sounds normal.  Abdominal: Soft. Bowel sounds are normal. He exhibits no distension. There is no tenderness. There is no rebound and no guarding.  Musculoskeletal: He exhibits deformity.  OA to knees, kyphosis and scoliosis, able to move all 4 extremities, unsteady gait, on wheelchair  Lymphadenopathy:    He has no cervical adenopathy.  Neurological: He is alert and oriented to person, place, and time.  Deaf, conversation this visit by writing on a note pad  Skin: Skin is warm and dry.  Psychiatric: He has a normal mood and affect.    Labs reviewed: Basic Metabolic Panel: Recent Labs    04/23/17 1231 04/24/17 0649 04/25/17 0410  NA 137 136 135  K 4.3 4.7 4.1  CL 103 103 103  CO2 23 24 24   GLUCOSE 135* 106* 95  BUN 23* 23* 20  CREATININE 1.15 1.13 1.10  CALCIUM 9.1 8.7* 8.4*  MG  --  2.2  --   PHOS  --  3.5  --    Liver Function Tests: Recent Labs    04/06/17 0821 04/16/17 04/23/17 1231  AST 24 16  23   ALT 11* 7 11*  ALKPHOS 75 87 105  BILITOT 0.8 0.6 1.1  PROT 5.6* 6.1 7.0  ALBUMIN 3.0* 3.6 3.8   No results for input(s): LIPASE, AMYLASE in the last 8760 hours. No results for input(s): AMMONIA in the last 8760 hours. CBC: Recent Labs    04/05/17 1846  04/23/17 1231 04/24/17 0649 04/25/17 0410  WBC 7.5   < > 8.2 6.4 6.7  NEUTROABS 6.3  --  7.0  --   --   HGB 12.0*   < > 11.4* 11.1* 10.6*  HCT 36.6*   < > 35.1* 34.0* 32.1*  MCV 88.6   < > 88.9 89.7 89.4  PLT 160   < > 147* 158 154   < > = values in this interval not displayed.   Cardiac Enzymes: Recent Labs    04/06/17 0821 04/06/17 1430 04/06/17 2017  TROPONINI 0.14* 0.10* 0.09*   BNP: Invalid input(s): POCBNP Lab Results  Component Value Date   HGBA1C 5.6 04/06/2017   Lab Results  Component Value Date   TSH 0.936 04/06/2017   Lab Results  Component Value Date   VITAMINB12 690 04/21/2010   Lab Results  Component Value Date   FOLATE  04/21/2010    14.6 (NOTE)  Reference Ranges        Deficient:       0.4 - 3.3 ng/mL        Indeterminate:   3.4 - 5.4 ng/mL  Normal:              > 5.4 ng/mL   Lab Results  Component Value Date   IRON 12 (L) 04/21/2010   TIBC 182 (L) 04/21/2010   FERRITIN 153 04/21/2010    Lipid Panel: Recent Labs    04/06/17 0821  CHOL 145  HDL 47  LDLCALC 89  TRIG 47  CHOLHDL 3.1   Lab Results  Component Value Date   HGBA1C 5.6 04/06/2017    Procedures since last visit: Ct Abdomen Pelvis W Contrast  Result Date: 04/23/2017 CLINICAL DATA:  Abdominal pain and vomiting. EXAM: CT ABDOMEN AND PELVIS WITH CONTRAST TECHNIQUE: Multidetector CT imaging of the abdomen and pelvis was performed using the standard protocol following bolus administration of intravenous contrast. CONTRAST:  ISOVUE-300 IOPAMIDOL (ISOVUE-300) INJECTION 61% COMPARISON:  CT abdomen pelvis dated February 07, 2014. FINDINGS: Lower chest: Borderline aneurysmal ascending thoracic aorta, measuring 4.0  cm. Enlargement of the right main pulmonary artery, measuring up to 3.6 cm. Bibasilar atelectasis. Hepatobiliary: No focal liver abnormality. Cholelithiasis. No gallbladder wall thickening or biliary dilatation. Pancreas: Mild parenchymal atrophy. Prominence of the main pancreatic duct, measuring up to 4 mm proximally. Spleen: Normal in size without focal abnormality. Adrenals/Urinary Tract: Adrenal glands are unremarkable. Kidneys are normal, without renal calculi, focal lesion, or hydronephrosis. Bladder is mildly distended. Stomach/Bowel: Large hiatal hernia. Multiple dilated loops of proximal to mid small bowel, with a transition point seen in the right anterior abdomen (series 3, image 50). Borderline dilated right and transverse colon containing a large amount of stool. The appendix is not visualized. Vascular/Lymphatic: Tortuous, atherosclerotic abdominal aorta, with large infrarenal abdominal aortic aneurysm status post stent graft. The excluded aneurysm sac as enlarged in size, now measuring 8.4 cm, previously 6.8 cm. Contrast within the excluded aneurysmal sac, consistent with type 2 endoleak, unchanged. No lymphadenopathy. Reproductive: Prostate is unremarkable. Other: No free fluid or pneumoperitoneum. Musculoskeletal: No acute or significant osseous findings. IMPRESSION: 1. Small bowel obstruction with transition point seen in the right anterior abdomen. 2. Patent bifurcated abdominal aortic stent graft with persistent type 2 endoleak and continued enlargement of the infrarenal abdominal aortic aneurysm, now measuring 8.4 cm, previously 6.8 cm. Vascular surgery consultation recommended due to increased risk of rupture for AAA >5.5 cm. This recommendation follows ACR consensus guidelines: White Paper of the ACR Incidental Findings Committee II on Vascular Findings. J Am Coll Radiol 2013; 10:789-794. 3. Borderline aneurysmal ascending thoracic aorta, measuring 4.0 cm. Recommend annual imaging followup by  CTA or MRA. This recommendation follows 2010 ACCF/AHA/AATS/ACR/ASA/SCA/SCAI/SIR/STS/SVM Guidelines for the Diagnosis and Management of Patients with Thoracic Aortic Disease. Circulation. 2010; 121: Z610-R604 4. Cholelithiasis. Electronically Signed   By: Obie Dredge M.D.   On: 04/23/2017 14:49   Dg Abd Portable 2v  Result Date: 04/24/2017 CLINICAL DATA:  Abdominal pain. EXAM: PORTABLE ABDOMEN - 2 VIEW COMPARISON:  CT 04/23/2017 FINDINGS: Again noted is an abdominal aortic stent graft. Contrast within the urinary bladder from recent CT examination. Nonobstructive bowel gas pattern. No evidence for free air on the decubitus image. Multiple phleboliths in the pelvis. Again noted is colonic stool in the upper abdomen. Scoliosis and multilevel degenerative disease in the lumbar spine. IMPRESSION: No acute abnormality. Electronically Signed   By: Richarda Overlie M.D.   On: 04/24/2017 08:15    Assessment/Plan  1. Transition of care performed with sharing of clinical summary Reviewed hospital notes, labs, imaging and medications. See above.   2. Constipation, chronic On chronic  opioid. This with decreased mobility could have worsened his constipation. TSH stable on review. Currently on miralax 17 g bid with senna s,2 tab qhs. Change dulcolax suppository to every other day for now and fleet enema every 3 day for constipation. Monitor bowel movement with his recent hospital admission for SBO.   3. Uncomplicated opioid dependence (HCC) Currently on oxyIR 10 mg daily and 5 mg bid prn. D/c prn dosing and place him on prn tylenol. Also place him on scheduled tyleol 1000 mg bid for pain from OA.  4. Essential hypertension Stable BP. Continue losartan 25 mg daily.   5. Abdominal aortic aneurysm (AAA) without rupture (HCC) Stable, monitor clinically, not a surgical candidate.   6. Hypothyroidism, unspecified type TSH stable, continue levothyroxine and monitor  7. SBO (small bowel obstruction)  (HCC) Resolved at time of d/c from hospital. Currently no signs of bowel obstruction on exam. Supportive care. Will need his bowel regulated to avoid obstruction of bowel. Bowel regimen changes as above.     Labs/tests ordered:  none  Communication: reviewed care plan with patient and charge nurse. Orders provided to charge nurse as pt resides in ALF    [employer], MD Internal Medicine [facility] [address] [place], [place] [address] Cell Phone (Monday-Friday 8 am - 5 pm): [phone] On Call: [phone] and follow prompts after 5 pm and on weekends Office Phone: [phone] Office Fax: [phone]

## 2017-05-06 NOTE — ED Triage Notes (Signed)
Pt BIB EMS from [facility] with complaints of constipation and a bowel obstruction. Patient has not had a bowel movement in 3 days. Patient had an x-ray today which confirmed a bowel obstruction. Patient has been giving himself enemas x3 days with no success.

## 2017-05-07 DIAGNOSIS — R100 Acute abdomen: Secondary | ICD-10-CM | POA: Diagnosis not present

## 2017-05-07 DIAGNOSIS — K5900 Constipation, unspecified: Secondary | ICD-10-CM | POA: Diagnosis not present

## 2017-05-07 LAB — T4, FREE: FREE T4: 1.05 ng/dL (ref 0.61–1.12)

## 2017-05-07 NOTE — ED Notes (Signed)
[facility] at bedside 

## 2017-05-07 NOTE — ED Notes (Signed)
[facility] called for transport. Discharge papers discussed with patient and family.

## 2017-05-07 NOTE — ED Provider Notes (Signed)
[facility]-EMERGENCY DEPT Provider Note   CSN: [id number] Arrival date & time: 05/06/17  2119     History   Chief Complaint Chief Complaint  Patient presents with  . Bowel Obstruction    HPI Ricardo [name] is a 84 y.o. male.  HPI   84 year old male with a history of AAA with known endoleak, not a candidate for further repair per Dr. Darrick Penna 1/28, congestive heart failure, chronic pain on oxycodone, hypothyroidism, htn, recent episodes of ileus, and admission 2 weeks ago for small bowel obstruction, who presents with concern for constipation and x-ray at facility concerning for bowel obstruction.  Patient reports he has not had a bowel movement in approximately 4 days.  They have been trying MiraLAX, stool softeners, and enemas without relief.  Given continued constipation, x-ray was done today which was concerning for possible bowel obstruction he was sent to the emergency department.  Patient denies any abdominal pain.  Denies nausea and vomiting.  Has been eating and drinking normally today.  Denies fevers or urinary symptoms.  Daughter wants to know etiology of patient's new issues with constipation, ileus, and small bowel obstructions and reports he has been on opiates for a long time and had not previously had issues.  Past Medical History:  Diagnosis Date  . AAA (abdominal aortic aneurysm) (HCC)   . Abnormality of gait 08/01/2010  . Adhesive capsulitis of shoulder 08/01/2010   "Frozen" right shoulder  . Anemia   . Anxiety state, unspecified 08/01/2010  . Arthritis   . Congestive heart failure, unspecified 08/01/2010  . Cough 08/07/2015  . Disorders of bursae and tendons in shoulder region, unspecified 08/01/2010  . Edema 08/01/2010  . Fall   . Fecal impaction (HCC)   . GERD (gastroesophageal reflux disease)   . Hiatal hernia   . Hypercalcemia   . Hyperlipidemia   . Hypertension   . Hypertrophy of prostate without urinary obstruction and other lower  urinary tract symptoms (LUTS) 08/01/2010  . Hyponatremia   . Hypothyroidism   . Infectious colitis   . Insomnia, unspecified 08/01/2010  . Joint pain   . Leg pain    with walking  . Lumbago 12/24/2010  . Orthostatic hypotension 11/05/2010  . Other specified cardiac dysrhythmias(427.89) 12/24/2010  . Pain in joint, pelvic region and thigh 08/06/2010  . Peptic ulcer, unspecified site, unspecified as acute or chronic, without mention of hemorrhage, perforation, or obstruction 08/01/2010  . Renal failure, acute (HCC)   . Swelling, mass, or lump in head and neck 01/03/2011  . Ulcer    Stomach  . Unspecified disorder of kidney and ureter 08/01/2010  . Unspecified hearing loss 08/01/2010  . Unspecified vitamin D deficiency 08/06/2010  . Urinary frequency 09/13/2010  . Vertigo     Patient Active Problem List   Diagnosis Date Noted  . SBO (small bowel obstruction) (HCC) 04/23/2017  . Protein-calorie malnutrition, severe 04/07/2017  . Nausea and vomiting 04/07/2017  . Nausea & vomiting 04/05/2017  . Tachycardia 04/05/2017  . Neuropathic pain 12/08/2016  . Anemia of chronic disease 12/08/2016  . DNR (do not resuscitate)   . Cough 08/07/2015  . Chronic mucus hypersecretion, respiratory 04/10/2015  . Xerostomia 03/27/2015  . Edema 03/27/2015  . Bradycardia by electrocardiogram 09/22/2014  . Spinal stenosis in cervical region 09/06/2013  . Deaf 06/21/2013  . Lumbago 06/21/2013  . Constipation 03/08/2013  . Hypothyroidism 03/08/2013  . BPH (benign prostatic hyperplasia) 03/08/2013  . Depression with anxiety 03/08/2013  . Fall  at nursing home 03/08/2013  . AAA (abdominal aortic aneurysm) (HCC) 01/27/2012  . Abnormality of gait 08/01/2010  . Osteoarthritis 05/17/2009  . GERD 03/16/2009  . ULCER-GASTRIC 03/16/2009  . DIVERTICULOSIS-COLON 03/16/2009  . Dyslipidemia 01/30/2009  . Iron deficiency anemia 01/30/2009  . Essential hypertension 01/30/2009  . VERTIGO 01/30/2009  . Chronic kidney  disease, stage III (moderate) (HCC) 01/30/2009    Past Surgical History:  Procedure Laterality Date  . ABDOMINAL AORTIC ANEURYSM REPAIR  12-18-10   Stent graft repair of AAA  . APPENDECTOMY    . CATARACT EXTRACTION    . HEMORRHOID SURGERY  2000  . INGUINAL HERNIA REPAIR Left 2005  . TRANSTHORACIC ECHOCARDIOGRAM  07/19/2010    Left ventricle: There is hypokinesis of the inferior wall and  posterior wall. The EF is 35-40%. The cavity size was normal       Home Medications    Prior to Admission medications   Medication Sig Start Date End Date Taking? Authorizing Provider  acetaminophen (TYLENOL) 500 MG tablet 1000 mg po bid and 500 mg q8h prn for pain 05/06/17  Yes Oneal Grout, MD  alum & mag hydroxide-simeth (MINTOX) 200-200-20 MG/5ML suspension Take 30 mLs by mouth every 4 (four) hours as needed for indigestion or heartburn (for 48 hours and call MD if symptoms persist).    Yes [provider]  bisacodyl (DULCOLAX) 10 MG suppository Place 10 mg rectally every other day. For constipation   Yes [provider]  bisacodyl (FLEET) 10 MG/30ML ENEM Place 10 mg rectally daily as needed (if hard stool is present).    Yes [provider]  Cholecalciferol (VITAMIN D3) 2000 units TABS Take 1,000 Units by mouth daily.    Yes [provider]  DEXTRAN 70-HYPROMELLOSE, PF, OP Place 1 drop into both eyes as needed (for dry eyes).   Yes [provider]  finasteride (PROSCAR) 5 MG tablet Take 5 mg by mouth daily.  01/25/13  Yes [provider]  gabapentin (NEURONTIN) 100 MG capsule Take 100 mg by mouth daily with supper.    Yes [provider]  levothyroxine (SYNTHROID, LEVOTHROID) 50 MCG tablet Take 50 mcg by mouth daily before breakfast.  01/10/13  Yes [provider]  Lidocaine (ASPERCREME LIDOCAINE) 4 % PTCH Apply 1 patch topically See admin instructions. 1 patch applied once a day to mid lower back   Yes [provider]   losartan (COZAAR) 25 MG tablet Take 25 mg by mouth daily.  12/31/12  Yes [provider]  magic mouthwash w/lidocaine SOLN Take 5-10 mLs by mouth every 4 (four) hours as needed for mouth pain.   Yes [provider]  Misc. Devices (SLEEPRIGHT BREATHE AID) MISC 1 strip at bedtime.   Yes [provider]  Nutritional Supplements (FEEDING SUPPLEMENT, BOOST BREEZE,) LIQD Take 1 Can by mouth 2 (two) times daily between meals.   Yes [provider]  omeprazole (PRILOSEC OTC) 20 MG tablet Take 20 mg by mouth daily.   Yes [provider]  oxybutynin (DITROPAN-XL) 5 MG 24 hr tablet Take 5 mg by mouth daily.    Yes [provider]  oxyCODONE (OXYCONTIN) 10 mg 12 hr tablet Take 1 tablet (10 mg total) by mouth daily. 05/06/17  Yes Oneal Grout, MD  polyethylene glycol (MIRALAX / GLYCOLAX) packet Take 17 g by mouth 2 (two) times daily.    Yes [provider]  sennosides-docusate sodium (SENOKOT-S) 8.6-50 MG tablet Take 2 tablets by mouth at bedtime.  Yes [provider]  sodium chloride (OCEAN) 0.65 % SOLN nasal spray Place 1 spray into both nostrils 3 (three) times daily as needed for congestion.   Yes [provider]    Family History Family History  Problem Relation Age of Onset  . Heart failure Brother     Social History Social History   Tobacco Use  . Smoking status: Never Smoker  . Smokeless tobacco: Never Used  Substance Use Topics  . Alcohol use: Yes    Alcohol/week: 0.0 oz    Comment: 1 1/2 oz daily   . Drug use: No     Allergies   Aleve [naproxen] and Indocin [indomethacin]   Review of Systems Review of Systems  Constitutional: Negative for appetite change and fever.  Eyes: Negative for visual disturbance.  Respiratory: Negative for shortness of breath.   Cardiovascular: Negative for chest pain.  Gastrointestinal: Positive for constipation. Negative for abdominal pain, nausea and vomiting.    Genitourinary: Negative for difficulty urinating.  Musculoskeletal: Positive for arthralgias. Negative for neck stiffness.  Skin: Negative for rash.  Neurological: Negative for syncope and headaches.     Physical Exam Updated Vital Signs BP (!) 114/98 (BP Location: Right Arm)   Pulse (!) 52   Temp 98.2 F (36.8 C) (Oral)   Resp 16   SpO2 96%   Physical Exam  Constitutional: He is oriented to person, place, and time. He appears well-developed and well-nourished. No distress.  HENT:  Head: Normocephalic and atraumatic.  Eyes: Conjunctivae and EOM are normal.  Neck: Normal range of motion.  Cardiovascular: Normal rate, regular rhythm, normal heart sounds and intact distal pulses. Exam reveals no gallop and no friction rub.  No murmur heard. Pulmonary/Chest: Effort normal and breath sounds normal. No respiratory distress. He has no wheezes. He has no rales.  Abdominal: Soft. He exhibits no distension. There is no tenderness. There is no guarding.  Musculoskeletal: He exhibits no edema.  Neurological: He is alert and oriented to person, place, and time.  Skin: Skin is warm and dry. He is not diaphoretic.  Nursing note and vitals reviewed.    ED Treatments / Results  Labs (all labs ordered are listed, but only abnormal results are displayed) Labs Reviewed  CBC WITH DIFFERENTIAL/PLATELET - Abnormal; Notable for the following components:      Result Value   RBC 3.30 (*)    Hemoglobin 9.6 (*)    HCT 29.3 (*)    All other components within normal limits  COMPREHENSIVE METABOLIC PANEL - Abnormal; Notable for the following components:   BUN 27 (*)    Calcium 8.5 (*)    Total Protein 6.3 (*)    Albumin 3.4 (*)    ALT 13 (*)    GFR calc non Af Amer 54 (*)    All other components within normal limits  LIPASE, BLOOD  TSH  T4, FREE    EKG  EKG Interpretation None       Radiology Ct Abdomen Pelvis W Contrast  Result Date: 05/07/2017 CLINICAL DATA:  84 year old male  with history of constipation and bowel obstruction. No bowel movements in 3 days. EXAM: CT ABDOMEN AND PELVIS WITH CONTRAST TECHNIQUE: Multidetector CT imaging of the abdomen and pelvis was performed using the standard protocol following bolus administration of intravenous contrast. CONTRAST:  ISOVUE-300 IOPAMIDOL (ISOVUE-300) INJECTION 61% COMPARISON:  Abdominal CT dated 04/23/2017 and 02/07/2014 FINDINGS: Lower chest: There is moderate cardiomegaly. There are bibasilar subsegmental atelectatic changes and possible  mild vascular congestion. Coronary vascular calcifications noted. There is no intra-abdominal free air or free fluid. Hepatobiliary: The liver is unremarkable. The gallbladder is physiologically distended. There is a stone within the gallbladder. No pericholecystic fluid or evidence of acute cholecystitis by CT. Pancreas: Unremarkable. No pancreatic ductal dilatation or surrounding inflammatory changes. Spleen: Normal in size without focal abnormality. Adrenals/Urinary Tract: The adrenal glands are unremarkable. There is no hydronephrosis on either side. The visualized ureters are unremarkable. The urinary bladder is distended and mildly trabeculated possibly related to chronic bladder outlet obstruction. Stomach/Bowel: There is a large hiatal hernia containing the majority of the stomach. No evidence of gastric outlet obstruction. There is no dilatation of small bowel or evidence of obstruction. The sigmoid colon is redundant. The loops of redundant sigmoid colon in the left mid abdomen to the left of the aortic aneurysm are collapsed. There is a large amount of stool throughout the colon proximal to the sigmoid. No evidence of mechanical obstruction or twisting. Appendectomy. Vascular/Lymphatic: There is advanced aortoiliac atherosclerotic disease. Status post previous aorto bi-iliac endovascular stent graft repair of an infrarenal abdominal aortic aneurysm. The aneurysm sac measures  approximately 8.8 cm in greatest axial diameter similar to prior CT of 04/23/2017. similar pattern contrast within the excluded aneurysmal sac as previously described type 2 endoleak appears unchanged. No contrast noted outside of the confines of the sac. No evidence of rupture. The endovascular stent graft appears patent. Reproductive: The prostate and seminal vesicles are grossly unremarkable. Other: Mild diffuse subcutaneous edema. Musculoskeletal: Degenerative changes of the spine. No acute osseous pathology. IMPRESSION: 1. Redundancy of the sigmoid colon with collapseed redundant segment in the left mid abdomen to the left of the aortic aneurysm. Large amount of stool noted throughout the colon proximal to this segment. No mechanical obstruction or twisting. 2. Large hiatal hernia containing the majority of the stomach. No evidence of small-bowel obstruction. 3. Infrarenal abdominal aortic aneurysm status post prior endovascular stent graft repair with evidence of type II endoleak appears similar and stable in size compared to the prior CT of 04/23/2017. No evidence of rupture. The stent graft is widely patent. Electronically Signed   By: Elgie Collard M.D.   On: 05/07/2017 00:16    Procedures Procedures (including critical care time)  Medications Ordered in ED Medications  sodium chloride 0.9 % bolus 1,000 mL (0 mLs Intravenous Stopped 05/07/17 0012)  iopamidol (ISOVUE-300) 61 % injection (100 mLs  Contrast Given 05/06/17 2341)  sodium chloride 0.9 % injection (  Given by Other 05/07/17 0033)     Initial Impression / Assessment and Plan / ED Course  I have reviewed the triage vital signs and the nursing notes.  Pertinent labs & imaging results that were available during my care of the patient were reviewed by me and considered in my medical decision making (see chart for details).    84 year old male with a history of AAA with known endoleak, not a candidate for further repair per Dr.  Darrick Penna 1/28, congestive heart failure, chronic pain on oxycodone, hypothyroidism, htn, recent episodes of ileus, and admission 2 weeks ago for small bowel obstruction, who presents with concern for constipation and x-ray at facility concerning for bowel obstruction.    Labs show no acute findings in the emergency department. TSH WNL. CT abdomen pelvis was done which showed no evidence of obstruction, however does show signs of redundant sigmoid colon.  Discussed this may be contributing to patient's constipation, in addition to large aneurysm, opiate use.  Recommend continued bowel regimen and PCP follow up, trying to minimize opiates. Patient discharged in stable condition with understanding of reasons to return.         Final Clinical Impressions(s) / ED Diagnoses   Final diagnoses:  Redundant colon  Constipation, unspecified constipation type    ED Discharge Orders    None       Alvira MondaySchlossman, Fletcher Rathbun, MD 05/07/17 [phone]

## 2017-05-20 DIAGNOSIS — L821 Other seborrheic keratosis: Secondary | ICD-10-CM | POA: Diagnosis not present

## 2017-05-20 DIAGNOSIS — L814 Other melanin hyperpigmentation: Secondary | ICD-10-CM | POA: Diagnosis not present

## 2017-05-20 DIAGNOSIS — L218 Other seborrheic dermatitis: Secondary | ICD-10-CM | POA: Diagnosis not present

## 2017-05-25 ENCOUNTER — Other Ambulatory Visit: Payer: Self-pay

## 2017-05-25 MED ORDER — OXYCODONE HCL ER 10 MG PO T12A: 10.0000 mg | EXTENDED_RELEASE_TABLET | Freq: Every day | ORAL | 0 refills | Status: DC

## 2017-05-29 ENCOUNTER — Non-Acute Institutional Stay: Payer: Medicare PPO | Admitting: Internal Medicine

## 2017-05-29 ENCOUNTER — Encounter: Payer: Self-pay | Admitting: Internal Medicine

## 2017-05-29 DIAGNOSIS — I714 Abdominal aortic aneurysm, without rupture, unspecified: Secondary | ICD-10-CM

## 2017-05-29 DIAGNOSIS — I10 Essential (primary) hypertension: Secondary | ICD-10-CM

## 2017-05-29 NOTE — Progress Notes (Signed)
Location:  [facility] Room Number: 32 Place of Service:  ALF [phone]) Provider:  Oneal Grout, MD  Oneal Grout, MD  Patient Care Team: Oneal Grout, MD as PCP - General (Internal Medicine) Ngetich, Donalee Citrin, NP as Nurse Practitioner (Family Medicine)  Extended Emergency Contact Information Primary Emergency Contact: [name], [place] Home Phone: [phone] Mobile Phone: [phone] Relation: Son Secondary Emergency Contact: [name] [place] Mobile Phone: [phone] Relation: Grandson  Code Status:  DNR  Goals of care: Advanced Directive information Advanced Directives 05/29/2017  Does Patient Have a Medical Advance Directive? Yes  Type of Estate agent of [place];Living will;Out of facility DNR (pink MOST or yellow form)  Does patient want to make changes to medical advance directive? No - Patient declined  Copy of Healthcare Power of Attorney in Chart? Yes  Would patient like information on creating a medical advance directive? -  Pre-existing out of facility DNR order (yellow form or pink MOST form) Yellow form placed in chart (order not valid for inpatient use);Pink MOST form placed in chart (order not valid for inpatient use)     Chief Complaint  Patient presents with  . Acute Visit    Elevated blood pressure     HPI:  Pt is a 84 y.o. male seen today for an acute visit for elevated BP readings. Per nursing report his BP has been elevated. Reviewed chart. Readings of 161/95, 170/52, 174/88, 166/82, 183/97. Patient seen in his room with his nephew and charge nurse present. He is very hard of hearing. Communication by writing in a notepad.    Past Medical History:  Diagnosis Date  . AAA (abdominal aortic aneurysm) (HCC)   . Abnormality of gait 08/01/2010  . Adhesive capsulitis of shoulder 08/01/2010   "Frozen" right shoulder  . Anemia   . Anxiety  state, unspecified 08/01/2010  . Arthritis   . Congestive heart failure, unspecified 08/01/2010  . Cough 08/07/2015  . Disorders of bursae and tendons in shoulder region, unspecified 08/01/2010  . Edema 08/01/2010  . Fall   . Fecal impaction (HCC)   . GERD (gastroesophageal reflux disease)   . Hiatal hernia   . Hypercalcemia   . Hyperlipidemia   . Hypertension   . Hypertrophy of prostate without urinary obstruction and other lower urinary tract symptoms (LUTS) 08/01/2010  . Hyponatremia   . Hypothyroidism   . Infectious colitis   . Insomnia, unspecified 08/01/2010  . Joint pain   . Leg pain    with walking  . Lumbago 12/24/2010  . Orthostatic hypotension 11/05/2010  . Other specified cardiac dysrhythmias(427.89) 12/24/2010  . Pain in joint, pelvic region and thigh 08/06/2010  . Peptic ulcer, unspecified site, unspecified as acute or chronic, without mention of hemorrhage, perforation, or obstruction 08/01/2010  . Renal failure, acute (HCC)   . Swelling, mass, or lump in head and neck 01/03/2011  . Ulcer    Stomach  . Unspecified disorder of kidney and ureter 08/01/2010  . Unspecified hearing loss 08/01/2010  . Unspecified vitamin D deficiency 08/06/2010  . Urinary frequency 09/13/2010  . Vertigo    Past Surgical History:  Procedure Laterality Date  . ABDOMINAL AORTIC ANEURYSM REPAIR  12-18-10   Stent graft repair of AAA  . APPENDECTOMY    . CATARACT EXTRACTION    . HEMORRHOID SURGERY  2000  . INGUINAL HERNIA REPAIR Left 2005  .  TRANSTHORACIC ECHOCARDIOGRAM  07/19/2010    Left ventricle: There is hypokinesis of the inferior wall and  posterior wall. The EF is 35-40%. The cavity size was normal    Allergies  Allergen Reactions  . Aleve [Naproxen] Other (See Comments)    Per MAR  . Indocin [Indomethacin] Other (See Comments)    Per [facility]    Outpatient Encounter Medications as of 05/29/2017  Medication Sig  . acetaminophen (TYLENOL) 500 MG tablet 1000 mg po bid and 500 mg q8h prn  for pain  . alum & mag hydroxide-simeth (MINTOX) 200-200-20 MG/5ML suspension Take 30 mLs by mouth every 4 (four) hours as needed for indigestion or heartburn (for 48 hours and call MD if symptoms persist).   . benzocaine (ORAJEL) 10 % mucosal gel Use as directed 1 application in the mouth or throat as needed for mouth pain.  . bisacodyl (DULCOLAX) 10 MG suppository Place 10 mg rectally every other day. For constipation  . Cholecalciferol (VITAMIN D3) 2000 units TABS Take 1,000 Units by mouth daily.   Marland Kitchen DEXTRAN 70-HYPROMELLOSE, PF, OP Place 1 drop into both eyes as needed (for dry eyes).  . Dextromethorphan-Guaifenesin (TUSSIN COUGH DM PO) Take 5 mLs by mouth every 4 (four) hours as needed.  . finasteride (PROSCAR) 5 MG tablet Take 5 mg by mouth daily.   . fluocinonide (LIDEX) 0.05 % external solution Apply 1 application topically as needed.  . gabapentin (NEURONTIN) 100 MG capsule Take 100 mg by mouth daily with supper.   . hydrocortisone 1 % lotion Apply 1 application topically as needed for itching.  Marland Kitchen ketoconazole (NIZORAL) 2 % shampoo Apply 1 application topically once a week.  . levothyroxine (SYNTHROID, LEVOTHROID) 50 MCG tablet Take 50 mcg by mouth daily before breakfast.   . Lidocaine (ASPERCREME LIDOCAINE) 4 % PTCH Apply 1 patch topically See admin instructions. 1 patch applied once a day to mid lower back  . losartan (COZAAR) 25 MG tablet Take 25 mg by mouth daily. Hold for BP less than 90/60  . magic mouthwash w/lidocaine SOLN Take 5-10 mLs by mouth every 4 (four) hours as needed for mouth pain.  . magnesium citrate SOLN Take by mouth as needed for severe constipation. Per ED physician resident may take 10 oz by mouth followed by 8 oz of water as needed every 4th day if no bowel movement in 3 days  . Misc. Devices (SLEEPRIGHT BREATHE AID) MISC 1 strip at bedtime.  . Nutritional Supplements (FEEDING SUPPLEMENT, BOOST BREEZE,) LIQD Take 1 Can by mouth 2 (two) times daily between meals.    Marland Kitchen omeprazole (PRILOSEC OTC) 20 MG tablet Take 20 mg by mouth daily.  Marland Kitchen oxybutynin (DITROPAN-XL) 5 MG 24 hr tablet Take 5 mg by mouth daily.   Marland Kitchen oxyCODONE (OXYCONTIN) 10 mg 12 hr tablet Take 1 tablet (10 mg total) by mouth daily.  . polyethylene glycol (MIRALAX / GLYCOLAX) packet Take 17 g by mouth 2 (two) times daily.   . sennosides-docusate sodium (SENOKOT-S) 8.6-50 MG tablet Take 2 tablets by mouth at bedtime.  . sodium chloride (OCEAN) 0.65 % SOLN nasal spray Place 1 spray into both nostrils 3 (three) times daily as needed for congestion.  . triamcinolone (KENALOG) 0.1 % paste Use as directed 1 application in the mouth or throat every 4 (four) hours as needed.  . [DISCONTINUED] bisacodyl (FLEET) 10 MG/30ML ENEM Place 10 mg rectally daily as needed (if hard stool is present).    No facility-administered encounter medications on file  as of 05/29/2017.     Review of Systems  Constitutional: Negative for appetite change, chills and fever.  HENT: Negative for congestion and mouth sores.   Eyes: Negative for pain and discharge.       No new blurred vision  Respiratory: Negative for shortness of breath.   Cardiovascular: Negative for chest pain and palpitations.  Gastrointestinal: Negative for abdominal pain, nausea and vomiting.  Genitourinary: Negative for dysuria.  Musculoskeletal: Positive for back pain.       Has chronic back pain, denies acute worsening  Skin: Negative for rash.  Neurological: Negative for dizziness, numbness and headaches.       Denies new weakness   Psychiatric/Behavioral: Negative for confusion.    Immunization History  Administered Date(s) Administered  . Influenza-Unspecified 12/27/2010, 12/29/2011, 12/27/2012, 12/13/2013, 12/14/2014, 12/20/2015, 12/17/2016  . PPD Test 07/30/2010, 08/14/2010  . Pneumococcal Conjugate-13 11/04/2016  . Pneumococcal Polysaccharide-23 03/10/2005  . Td 03/11/1999  . Tdap 10/31/2016   Pertinent  Health Maintenance Due  Topic  Date Due  . INFLUENZA VACCINE  Completed  . PNA vac Low Risk Adult  Completed   Fall Risk  10/29/2016 08/07/2015 08/07/2015 04/10/2015 03/27/2015  Falls in the past year? Yes Yes No No No  Comment - 06/02/15- skin tear right arm - - -  Number falls in past yr: 1 - - - -  Injury with Fall? No - - - -   Functional Status Survey:    Vitals:   05/29/17 1127  BP: 138/78  Pulse: 67  Resp: 17  Temp: (!) 97.5 F (36.4 C)  TempSrc: Oral  SpO2: 98%  Weight: 134 lb (60.8 kg)  Height: 5\' 9"  (1.753 m)   Body mass index is 19.79 kg/m. Physical Exam  Constitutional: He is oriented to person, place, and time. He appears well-developed and well-nourished. No distress.  HENT:  Head: Normocephalic and atraumatic.  Mouth/Throat: Oropharynx is clear and moist.  Eyes: Pupils are equal, round, and reactive to light. Conjunctivae and EOM are normal. Right eye exhibits no discharge. Left eye exhibits no discharge.  Neck: Normal range of motion. Neck supple.  Cardiovascular: Normal rate and regular rhythm.  Pulmonary/Chest: Effort normal and breath sounds normal.  Abdominal: Soft. Bowel sounds are normal. There is no tenderness.  Musculoskeletal: He exhibits deformity. He exhibits no tenderness.  Able to move all 4 extremities, unsteady gait  Neurological: He is alert and oriented to person, place, and time.  Skin: Skin is dry. He is not diaphoretic.  Psychiatric: He has a normal mood and affect.    Labs reviewed: Recent Labs    04/24/17 0649 04/25/17 0410 05/06/17 2226  NA 136 135 136  K 4.7 4.1 4.8  CL 103 103 105  CO2 24 24 22   GLUCOSE 106* 95 93  BUN 23* 20 27*  CREATININE 1.13 1.10 1.08  CALCIUM 8.7* 8.4* 8.5*  MG 2.2  --   --   PHOS 3.5  --   --    Recent Labs    04/16/17 04/23/17 1231 05/06/17 2226  AST 16 23 19   ALT 7 11* 13*  ALKPHOS 87 105 96  BILITOT 0.6 1.1 0.6  PROT 6.1 7.0 6.3*  ALBUMIN 3.6 3.8 3.4*   Recent Labs    04/05/17 1846  04/23/17 1231 04/24/17 0649  04/25/17 0410 05/06/17 2226  WBC 7.5   < > 8.2 6.4 6.7 5.8  NEUTROABS 6.3  --  7.0  --   --  3.0  HGB 12.0*   < > 11.4* 11.1* 10.6* 9.6*  HCT 36.6*   < > 35.1* 34.0* 32.1* 29.3*  MCV 88.6   < > 88.9 89.7 89.4 88.8  PLT 160   < > 147* 158 154 208   < > = values in this interval not displayed.   Lab Results  Component Value Date   TSH 3.131 05/06/2017   Lab Results  Component Value Date   HGBA1C 5.6 04/06/2017   Lab Results  Component Value Date   CHOL 145 04/06/2017   HDL 47 04/06/2017   LDLCALC 89 04/06/2017   TRIG 47 04/06/2017   CHOLHDL 3.1 04/06/2017    Significant Diagnostic Results in last 30 days:  Ct Abdomen Pelvis W Contrast  Result Date: 05/07/2017 CLINICAL DATA:  84 year old male with history of constipation and bowel obstruction. No bowel movements in 3 days. EXAM: CT ABDOMEN AND PELVIS WITH CONTRAST TECHNIQUE: Multidetector CT imaging of the abdomen and pelvis was performed using the standard protocol following bolus administration of intravenous contrast. CONTRAST:  ISOVUE-300 IOPAMIDOL (ISOVUE-300) INJECTION 61% COMPARISON:  Abdominal CT dated 04/23/2017 and 02/07/2014 FINDINGS: Lower chest: There is moderate cardiomegaly. There are bibasilar subsegmental atelectatic changes and possible mild vascular congestion. Coronary vascular calcifications noted. There is no intra-abdominal free air or free fluid. Hepatobiliary: The liver is unremarkable. The gallbladder is physiologically distended. There is a stone within the gallbladder. No pericholecystic fluid or evidence of acute cholecystitis by CT. Pancreas: Unremarkable. No pancreatic ductal dilatation or surrounding inflammatory changes. Spleen: Normal in size without focal abnormality. Adrenals/Urinary Tract: The adrenal glands are unremarkable. There is no hydronephrosis on either side. The visualized ureters are unremarkable. The urinary bladder is distended and mildly trabeculated possibly related to chronic  bladder outlet obstruction. Stomach/Bowel: There is a large hiatal hernia containing the majority of the stomach. No evidence of gastric outlet obstruction. There is no dilatation of small bowel or evidence of obstruction. The sigmoid colon is redundant. The loops of redundant sigmoid colon in the left mid abdomen to the left of the aortic aneurysm are collapsed. There is a large amount of stool throughout the colon proximal to the sigmoid. No evidence of mechanical obstruction or twisting. Appendectomy. Vascular/Lymphatic: There is advanced aortoiliac atherosclerotic disease. Status post previous aorto bi-iliac endovascular stent graft repair of an infrarenal abdominal aortic aneurysm. The aneurysm sac measures approximately 8.8 cm in greatest axial diameter similar to prior CT of 04/23/2017. similar pattern contrast within the excluded aneurysmal sac as previously described type 2 endoleak appears unchanged. No contrast noted outside of the confines of the sac. No evidence of rupture. The endovascular stent graft appears patent. Reproductive: The prostate and seminal vesicles are grossly unremarkable. Other: Mild diffuse subcutaneous edema. Musculoskeletal: Degenerative changes of the spine. No acute osseous pathology. IMPRESSION: 1. Redundancy of the sigmoid colon with collapseed redundant segment in the left mid abdomen to the left of the aortic aneurysm. Large amount of stool noted throughout the colon proximal to this segment. No mechanical obstruction or twisting. 2. Large hiatal hernia containing the majority of the stomach. No evidence of small-bowel obstruction. 3. Infrarenal abdominal aortic aneurysm status post prior endovascular stent graft repair with evidence of type II endoleak appears similar and stable in size compared to the prior CT of 04/23/2017. No evidence of rupture. The stent graft is widely patent. Electronically Signed   By: Elgie Collard M.D.   On: 05/07/2017 00:16     Assessment/Plan  1. Uncontrolled hypertension  Elevated BP readings, no signs or symptom of hypertensive emergency or urgency. Increase current dosing of losartan from 25 mg daily to 50 mg daily and monitor. Check BMP in 1 week.   2. Abdominal aortic aneurysm (AAA) without rupture (HCC) Denies pain. Monitor clinically. Will need BP to be better controlled. See above.     Family/ staff Communication: reviewed care plan with patient and charge nurse.    Labs/tests ordered:  bmp  Oneal Grout, MD Internal Medicine [facility] [address] [place], [place] [address] Cell Phone (Monday-Friday 8 am - 5 pm): [phone] On Call: [phone] and follow prompts after 5 pm and on weekends Office Phone: [phone] Office Fax: [phone]

## 2017-06-04 ENCOUNTER — Encounter: Payer: Self-pay | Admitting: *Deleted

## 2017-06-04 DIAGNOSIS — I10 Essential (primary) hypertension: Secondary | ICD-10-CM | POA: Diagnosis not present

## 2017-06-04 LAB — BASIC METABOLIC PANEL
BUN: 33 — AB (ref 4–21)
CREATININE: 1.1 (ref 0.6–1.3)
Calcium: 9.3
Creat: 1.11
GLUCOSE: 90
Glucose: 90
POTASSIUM: 4.7
Potassium: 4.7 (ref 3.4–5.3)
Sodium: 140
Sodium: 140 (ref 137–147)

## 2017-06-15 ENCOUNTER — Encounter: Payer: Self-pay | Admitting: Internal Medicine

## 2017-06-15 ENCOUNTER — Non-Acute Institutional Stay: Payer: Medicare PPO | Admitting: Internal Medicine

## 2017-06-15 DIAGNOSIS — D638 Anemia in other chronic diseases classified elsewhere: Secondary | ICD-10-CM | POA: Diagnosis not present

## 2017-06-15 DIAGNOSIS — K219 Gastro-esophageal reflux disease without esophagitis: Secondary | ICD-10-CM

## 2017-06-15 DIAGNOSIS — E039 Hypothyroidism, unspecified: Secondary | ICD-10-CM | POA: Diagnosis not present

## 2017-06-15 DIAGNOSIS — N183 Chronic kidney disease, stage 3 unspecified: Secondary | ICD-10-CM

## 2017-06-15 DIAGNOSIS — G8929 Other chronic pain: Secondary | ICD-10-CM | POA: Diagnosis not present

## 2017-06-15 DIAGNOSIS — J309 Allergic rhinitis, unspecified: Secondary | ICD-10-CM

## 2017-06-15 DIAGNOSIS — I714 Abdominal aortic aneurysm, without rupture, unspecified: Secondary | ICD-10-CM

## 2017-06-15 DIAGNOSIS — I10 Essential (primary) hypertension: Secondary | ICD-10-CM

## 2017-06-15 DIAGNOSIS — K5901 Slow transit constipation: Secondary | ICD-10-CM | POA: Diagnosis not present

## 2017-06-15 LAB — BASIC METABOLIC PANEL
CALCIUM: 9.3
CO2: 24
Chloride: 108
GFR CALC NON AF AMER: 54

## 2017-06-15 NOTE — Progress Notes (Signed)
Location:  [facility] Room Number: 32 Place of Service:  ALF [phone]) Provider:  Oneal Grout MD  Oneal Grout, MD  Patient Care Team: Oneal Grout, MD as PCP - General (Internal Medicine) Ngetich, Donalee Citrin, NP as Nurse Practitioner (Family Medicine)  Extended Emergency Contact Information Primary Emergency Contact: [name], [place] Home Phone: [phone] Mobile Phone: [phone] Relation: Son Secondary Emergency Contact: [name] Mobile Phone: [phone] Relation: Grandson  Code Status:  DNR  Goals of care: Advanced Directive information Advanced Directives 06/15/2017  Does Patient Have a Medical Advance Directive? Yes  Type of Estate agent of [place];Out of facility DNR (pink MOST or yellow form);Living will  Does patient want to make changes to medical advance directive? No - Patient declined  Copy of Healthcare Power of Attorney in Chart? Yes  Would patient like information on creating a medical advance directive? -  Pre-existing out of facility DNR order (yellow form or pink MOST form) Yellow form placed in chart (order not valid for inpatient use);Pink MOST form placed in chart (order not valid for inpatient use)     Chief Complaint  Patient presents with  . Medical Management of Chronic Issues    Routine Visit     HPI:  Pt is a 84 y.o. male seen today for medical management of chronic diseases.    Hypertension- currently on losartan 50 mg daily, dose was increased on 3/22/219. BMP and BP reading reviewed. Several BP readings with elevated SBP.   AAA- denies abdominal pain. Vital signs stable on review. Last imaging 05/06/17 showed AAA 8 cm with evidence of type 2 endoleak.  gerd- on omeprazole 20 mg daily. Denies reflux symptoms  Hypothyroidism- takes levothyroxine 50 mcg daily, TSH reviewed.   Chronic constipation- takes miralax bid  and senokot 2 tablet daily. Last bowel movement was yesterday. At present mentions having bowel movement every other day to daily. Also on suppository qod.   Chronic low back pain- treated with gabapentin daily, lidocaine patch, tylenol 1000 mg bid and 500 mg q8h prn and oxycontin 10 mg daily. Pain well controlled and tolerable this visit.   Hyperlipidemia- lipid panel reviewed, currently diet controlled.   Past Medical History:  Diagnosis Date  . AAA (abdominal aortic aneurysm) (HCC)   . Abnormality of gait 08/01/2010  . Adhesive capsulitis of shoulder 08/01/2010   "Frozen" right shoulder  . Anemia   . Anxiety state, unspecified 08/01/2010  . Arthritis   . Congestive heart failure, unspecified 08/01/2010  . Cough 08/07/2015  . Disorders of bursae and tendons in shoulder region, unspecified 08/01/2010  . Edema 08/01/2010  . Fall   . Fecal impaction (HCC)   . GERD (gastroesophageal reflux disease)   . Hiatal hernia   . Hypercalcemia   . Hyperlipidemia   . Hypertension   . Hypertrophy of prostate without urinary obstruction and other lower urinary tract symptoms (LUTS) 08/01/2010  . Hyponatremia   . Hypothyroidism   . Infectious colitis   . Insomnia, unspecified 08/01/2010  . Joint pain   . Leg pain    with walking  . Lumbago 12/24/2010  . Orthostatic hypotension 11/05/2010  . Other specified cardiac dysrhythmias(427.89) 12/24/2010  . Pain in joint, pelvic region and thigh 08/06/2010  . Peptic ulcer, unspecified site, unspecified as acute or chronic, without mention of hemorrhage, perforation, or obstruction 08/01/2010  . Renal failure, acute (  HCC)   . Swelling, mass, or lump in head and neck 01/03/2011  . Ulcer    Stomach  . Unspecified disorder of kidney and ureter 08/01/2010  . Unspecified hearing loss 08/01/2010  . Unspecified vitamin D deficiency 08/06/2010  . Urinary frequency 09/13/2010  . Vertigo    Past Surgical History:  Procedure Laterality Date  . ABDOMINAL AORTIC  ANEURYSM REPAIR  12-18-10   Stent graft repair of AAA  . APPENDECTOMY    . CATARACT EXTRACTION    . HEMORRHOID SURGERY  2000  . INGUINAL HERNIA REPAIR Left 2005  . TRANSTHORACIC ECHOCARDIOGRAM  07/19/2010    Left ventricle: There is hypokinesis of the inferior wall and  posterior wall. The EF is 35-40%. The cavity size was normal    Allergies  Allergen Reactions  . Aleve [Naproxen] Other (See Comments)    Per MAR  . Indocin [Indomethacin] Other (See Comments)    Per [facility]    Outpatient Encounter Medications as of 06/15/2017  Medication Sig  . acetaminophen (TYLENOL) 500 MG tablet 1000 mg po bid and 500 mg q8h prn for pain  . alum & mag hydroxide-simeth (MINTOX) 200-200-20 MG/5ML suspension Take 30 mLs by mouth every 4 (four) hours as needed for indigestion or heartburn (for 48 hours and call MD if symptoms persist).   . benzocaine (ORAJEL) 10 % mucosal gel Use as directed 1 application in the mouth or throat as needed for mouth pain.  . bisacodyl (DULCOLAX) 10 MG suppository Place 10 mg rectally every other day. For constipation  . Cholecalciferol (VITAMIN D3) 2000 units TABS Take 2,000 Units by mouth daily.   Marland Kitchen DEXTRAN 70-HYPROMELLOSE, PF, OP Place 1 drop into both eyes as needed (for dry eyes).  . Dextromethorphan-Guaifenesin (TUSSIN COUGH DM PO) Take 5 mLs by mouth every 4 (four) hours as needed.  . finasteride (PROSCAR) 5 MG tablet Take 5 mg by mouth daily.   . fluocinonide (LIDEX) 0.05 % external solution Apply 1 application topically as needed.  . gabapentin (NEURONTIN) 100 MG capsule Take 100 mg by mouth daily with supper.   . hydrocortisone 1 % lotion Apply 1 application topically as needed for itching.  Marland Kitchen ketoconazole (NIZORAL) 2 % shampoo Apply 1 application topically once a week.  . levothyroxine (SYNTHROID, LEVOTHROID) 50 MCG tablet Take 50 mcg by mouth daily before breakfast.   . Lidocaine (ASPERCREME LIDOCAINE) 4 % PTCH Apply 1 patch topically See admin instructions. 1 patch  applied once a day to mid lower back  . losartan (COZAAR) 50 MG tablet Take 50 mg by mouth daily. Hold for BP <110  . magic mouthwash w/lidocaine SOLN Take 5-10 mLs by mouth every 4 (four) hours as needed for mouth pain.  . magnesium citrate SOLN Take by mouth as needed for severe constipation. Per ED physician resident may take 10 oz by mouth followed by 8 oz of water as needed every 4th day if no bowel movement in 3 days  . Misc. Devices (SLEEPRIGHT BREATHE AID) MISC 1 strip at bedtime.  . Nutritional Supplements (FEEDING SUPPLEMENT, BOOST BREEZE,) LIQD Take 1 Can by mouth 2 (two) times daily between meals.  Marland Kitchen omeprazole (PRILOSEC OTC) 20 MG tablet Take 20 mg by mouth daily.  Marland Kitchen oxyCODONE (OXYCONTIN) 10 mg 12 hr tablet Take 1 tablet (10 mg total) by mouth daily.  . polyethylene glycol (MIRALAX / GLYCOLAX) packet Take 17 g by mouth 2 (two) times daily.   . sennosides-docusate sodium (SENOKOT-S) 8.6-50 MG tablet Take  2 tablets by mouth at bedtime.  . sodium chloride (OCEAN) 0.65 % SOLN nasal spray Place 1 spray into both nostrils 3 (three) times daily as needed for congestion.  . triamcinolone (KENALOG) 0.1 % paste Use as directed 1 application in the mouth or throat every 4 (four) hours as needed.  . [DISCONTINUED] losartan (COZAAR) 25 MG tablet Take 25 mg by mouth daily. Hold for BP less than 90/60  . [DISCONTINUED] oxybutynin (DITROPAN-XL) 5 MG 24 hr tablet Take 5 mg by mouth daily.    No facility-administered encounter medications on file as of 06/15/2017.     Review of Systems  Constitutional: Negative for chills, fever and unexpected weight change.       No change in energy level  HENT: Positive for hearing loss and rhinorrhea. Negative for congestion, mouth sores, sinus pressure, sore throat and trouble swallowing.        Has dentures  Eyes: Negative for itching.  Respiratory: Positive for cough. Negative for shortness of breath.        Chronic non productive cough  Cardiovascular:  Negative for chest pain and palpitations.  Gastrointestinal: Positive for constipation. Negative for abdominal pain, anal bleeding, diarrhea, nausea and vomiting.  Genitourinary: Positive for frequency. Negative for dysuria.       Wakes up 3-4 times at night to urinate  Musculoskeletal: Positive for back pain and gait problem.       No recent fall reported, has unsteady gait  Skin: Negative for rash.  Neurological: Negative for dizziness, weakness and numbness.  Psychiatric/Behavioral: Negative for behavioral problems and confusion.    Immunization History  Administered Date(s) Administered  . Influenza-Unspecified 12/27/2010, 12/29/2011, 12/27/2012, 12/13/2013, 12/14/2014, 12/20/2015, 12/17/2016  . PPD Test 07/30/2010, 08/14/2010  . Pneumococcal Conjugate-13 11/04/2016  . Pneumococcal Polysaccharide-23 03/10/2005  . Td 03/11/1999  . Tdap 10/31/2016   Pertinent  Health Maintenance Due  Topic Date Due  . INFLUENZA VACCINE  10/08/2017  . PNA vac Low Risk Adult  Completed   Fall Risk  10/29/2016 08/07/2015 08/07/2015 04/10/2015 03/27/2015  Falls in the past year? Yes Yes No No No  Comment - 06/02/15- skin tear right arm - - -  Number falls in past yr: 1 - - - -  Injury with Fall? No - - - -   Functional Status Survey:    Vitals:   06/15/17 1059  BP: 137/61  Pulse: 70  Resp: 20  Temp: 97.8 F (36.6 C)  TempSrc: Oral  SpO2: 96%  Weight: 139 lb (63 kg)  Height: 5\' 9"  (1.753 m)   Body mass index is 20.53 kg/m.   Wt Readings from Last 3 Encounters:  06/15/17 139 lb (63 kg)  05/29/17 134 lb (60.8 kg)  05/06/17 137 lb 3.2 oz (62.2 kg)   Physical Exam  Constitutional: He is oriented to person, place, and time. No distress.  Elderly male  HENT:  Head: Normocephalic and atraumatic.  Right Ear: External ear normal.  Left Ear: External ear normal.  Nose: Nose normal.  Mouth/Throat: Oropharynx is clear and moist.  Has hearing aids and dentures in place  Eyes: Pupils are  equal, round, and reactive to light. Conjunctivae are normal. Right eye exhibits no discharge. Left eye exhibits no discharge.  Neck: Normal range of motion. Neck supple.  Cardiovascular: Intact distal pulses.  Murmur heard. Irregular HR  Pulmonary/Chest: Effort normal. No respiratory distress. He has no wheezes. He has no rales.  Abdominal: Soft. Bowel sounds are normal. He exhibits no  distension. There is no tenderness. There is no rebound and no guarding.  Musculoskeletal: He exhibits edema.  Ambulates short distance with walker, uses motorized wheelchair for long distance. Limited ROM to RUE, strength 4/4 in all extremities, edema to LE right > left  Lymphadenopathy:    He has no cervical adenopathy.  Neurological: He is alert and oriented to person, place, and time.  Skin: Skin is warm and dry. He is not diaphoretic. No pallor.  Psychiatric: He has a normal mood and affect. His behavior is normal.    Labs reviewed: Recent Labs    04/24/17 0649 04/25/17 0410 05/06/17 2226 06/04/17  NA 136 135 136 140  140  K 4.7 4.1 4.8 4.7  4.7  CL 103 103 105 108  CO2 24 24 22 24   GLUCOSE 106* 95 93  --   BUN 23* 20 27* 33*  CREATININE 1.13 1.10 1.08 1.1  1.11  CALCIUM 8.7* 8.4* 8.5* 9.3  9.3  MG 2.2  --   --   --   PHOS 3.5  --   --   --    Recent Labs    04/16/17 04/23/17 1231 05/06/17 2226  AST 16 23 19   ALT 7 11* 13*  ALKPHOS 87 105 96  BILITOT 0.6 1.1 0.6  PROT 6.1 7.0 6.3*  ALBUMIN 3.6 3.8 3.4*   Recent Labs    04/05/17 1846  04/23/17 1231 04/24/17 0649 04/25/17 0410 05/06/17 2226  WBC 7.5   < > 8.2 6.4 6.7 5.8  NEUTROABS 6.3  --  7.0  --   --  3.0  HGB 12.0*   < > 11.4* 11.1* 10.6* 9.6*  HCT 36.6*   < > 35.1* 34.0* 32.1* 29.3*  MCV 88.6   < > 88.9 89.7 89.4 88.8  PLT 160   < > 147* 158 154 208   < > = values in this interval not displayed.   Lab Results  Component Value Date   TSH 3.131 05/06/2017   Lab Results  Component Value Date   HGBA1C 5.6  04/06/2017   Lab Results  Component Value Date   CHOL 145 04/06/2017   HDL 47 04/06/2017   LDLCALC 89 04/06/2017   TRIG 47 04/06/2017   CHOLHDL 3.1 04/06/2017    Significant Diagnostic Results in last 30 days:  No results found.  Assessment/Plan  1. Essential hypertension Elevated SBP on review. Pain controlled this visit. Increase losartan to 50 mg twice daily for now. Check BP/HR bid for now and will have pain level assessed at check of vitals as well to make sure elevated BP is not secondary to pain  2. Abdominal aortic aneurysm (AAA) without rupture (HCC) Symptom free at present. Continue to monitor clinically. High risk for rupture. Not a surgical candidate. Palliative care is on board.   3. Gastroesophageal reflux disease without esophagitis C/w omeprazole.   4. Hypothyroidism, unspecified type C/w levothyroxine, stable TSH  5. Chronic kidney disease, stage III (moderate) (HCC) Reviewed recent bmp, monitor clinically, periodically check bmp with him being on ARB.   6. Slow transit constipation Improved and stable symptom, no changes this visit.   7. Anemia of chronic disease Drop in Hb in recent lab review. Has endoleak of AAA confirmed on imaging, also has CKD 3. Both could be contributing to his anemia. No symptom of active bleed. Repeat cbc  8. Chronic pain Back pain 6/10, tolerable per pt, joint pain controlled, continue current pain regimen as above.  9. Allergic rhinitis Has history of chronic mucus hypersecretion and now has new symptom of clear discharge from nose. Will treat with loratadine 10 mg daily for 2 weeks and reassess if no improvement.    Family/ staff Communication: reviewed care plan with patient and charge nurse.    Labs/tests ordered:  cbc   Oneal GroutMAHIMA Lenetta Piche, MD Internal Medicine [facility] [address] [place], [place] [address] Cell Phone (Monday-Friday 8 am - 5 pm): [phone] On Call:  [phone] and follow prompts after 5 pm and on weekends Office Phone: [phone] Office Fax: [phone]

## 2017-06-18 ENCOUNTER — Encounter: Payer: Self-pay | Admitting: *Deleted

## 2017-06-18 DIAGNOSIS — D649 Anemia, unspecified: Secondary | ICD-10-CM | POA: Diagnosis not present

## 2017-06-18 LAB — CBC
HCT: 29.3
HGB: 9.7
WBC: 4.7
platelet count: 161

## 2017-06-25 DIAGNOSIS — H6123 Impacted cerumen, bilateral: Secondary | ICD-10-CM | POA: Diagnosis not present

## 2017-06-29 ENCOUNTER — Non-Acute Institutional Stay: Payer: Medicare PPO | Admitting: Family

## 2017-06-29 ENCOUNTER — Encounter: Payer: Self-pay | Admitting: Family

## 2017-06-29 DIAGNOSIS — I10 Essential (primary) hypertension: Secondary | ICD-10-CM

## 2017-06-29 DIAGNOSIS — R609 Edema, unspecified: Secondary | ICD-10-CM

## 2017-06-29 NOTE — Progress Notes (Signed)
Location:  [facility] Room Number: 32 Place of Service:  ALF [phone]) Provider: Jru Pense FNP-C  Oneal Grout, MD  Patient Care Team: Oneal Grout, MD as PCP - General (Internal Medicine) Somaly Marteney, Donalee Citrin, NP as Nurse Practitioner (Family Medicine)  Extended Emergency Contact Information Primary Emergency Contact: [name], [place] Home Phone: [phone] Mobile Phone: [phone] Relation: Son Secondary Emergency Contact: [name] [place] Mobile Phone: [phone] Relation: Grandson  Code Status:  DNR Goals of care: Advanced Directive information Advanced Directives 06/29/2017  Does Patient Have a Medical Advance Directive? Yes  Type of Estate agent of [name];Out of facility DNR (pink MOST or yellow form);Living will  Does patient want to make changes to medical advance directive? -  Copy of Healthcare Power of Attorney in Chart? Yes  Would patient like information on creating a medical advance directive? -  Pre-existing out of facility DNR order (yellow form or pink MOST form) Yellow form placed in chart (order not valid for inpatient use);Pink MOST form placed in chart (order not valid for inpatient use)     Chief Complaint  Patient presents with  . Acute Visit    elevated BP    HPI:  Pt is a 84 y.o. male seen today at [name] for an acute visit for evaluation of high blood pressure.He is seen in his room today with facility Nurse supervisor at bedside.He states arthritic pain stable on current pain medication.His blood pressure readings reviewed continues to be elevated systolic ranging in the 140's-170's.he denies any headache,dizziness,nausea,vomiting or blurry vision.No recent fall episode.Of note he has a medical history of AAA last imaging 05/06/2017 showed 8 cm with evidence of type 2 endo leak. He denies any abdominal pain.     Past  Medical History:  Diagnosis Date  . AAA (abdominal aortic aneurysm) (HCC)   . Abnormality of gait 08/01/2010  . Adhesive capsulitis of shoulder 08/01/2010   "Frozen" right shoulder  . Anemia   . Anxiety state, unspecified 08/01/2010  . Arthritis   . Congestive heart failure, unspecified 08/01/2010  . Cough 08/07/2015  . Disorders of bursae and tendons in shoulder region, unspecified 08/01/2010  . Edema 08/01/2010  . Fall   . Fecal impaction (HCC)   . GERD (gastroesophageal reflux disease)   . Hiatal hernia   . Hypercalcemia   . Hyperlipidemia   . Hypertension   . Hypertrophy of prostate without urinary obstruction and other lower urinary tract symptoms (LUTS) 08/01/2010  . Hyponatremia   . Hypothyroidism   . Infectious colitis   . Insomnia, unspecified 08/01/2010  . Joint pain   . Leg pain    with walking  . Lumbago 12/24/2010  . Orthostatic hypotension 11/05/2010  . Other specified cardiac dysrhythmias(427.89) 12/24/2010  . Pain in joint, pelvic region and thigh 08/06/2010  . Peptic ulcer, unspecified site, unspecified as acute or chronic, without mention of hemorrhage, perforation, or obstruction 08/01/2010  . Renal failure, acute (HCC)   . Swelling, mass, or lump in head and neck 01/03/2011  . Ulcer    Stomach  . Unspecified disorder of kidney and ureter 08/01/2010  . Unspecified hearing loss 08/01/2010  . Unspecified vitamin D deficiency 08/06/2010  . Urinary frequency 09/13/2010  . Vertigo    Past Surgical History:  Procedure Laterality Date  . ABDOMINAL AORTIC ANEURYSM REPAIR  12-18-10   Stent graft repair of  AAA  . APPENDECTOMY    . CATARACT EXTRACTION    . HEMORRHOID SURGERY  2000  . INGUINAL HERNIA REPAIR Left 2005  . TRANSTHORACIC ECHOCARDIOGRAM  07/19/2010    Left ventricle: There is hypokinesis of the inferior wall and  posterior wall. The EF is 35-40%. The cavity size was normal    Allergies  Allergen Reactions  . Aleve [Naproxen] Other (See Comments)    Per MAR    . Indocin [Indomethacin] Other (See Comments)    Per [facility]    Outpatient Encounter Medications as of 06/29/2017  Medication Sig  . acetaminophen (TYLENOL) 500 MG tablet 1000 mg po bid and 500 mg q8h prn for pain  . alum & mag hydroxide-simeth (MINTOX) 200-200-20 MG/5ML suspension Take 30 mLs by mouth every 4 (four) hours as needed for indigestion or heartburn (for 48 hours and call MD if symptoms persist).   . benzocaine (ORAJEL) 10 % mucosal gel Use as directed 1 application in the mouth or throat as needed for mouth pain.  . bisacodyl (DULCOLAX) 10 MG suppository Place 10 mg rectally every other day. For constipation  . Cholecalciferol (VITAMIN D3) 2000 units TABS Take 2,000 Units by mouth daily.   Marland Kitchen DEXTRAN 70-HYPROMELLOSE, PF, OP Place 1 drop into both eyes as needed (for dry eyes).  . Dextromethorphan-Guaifenesin (TUSSIN COUGH DM PO) Take 5 mLs by mouth every 4 (four) hours as needed.  . finasteride (PROSCAR) 5 MG tablet Take 5 mg by mouth daily.   . fluocinonide (LIDEX) 0.05 % external solution Apply 1 application topically as needed.  . gabapentin (NEURONTIN) 100 MG capsule Take 100 mg by mouth daily with supper.   . hydrocortisone 1 % lotion Apply 1 application topically as needed for itching.  Marland Kitchen ketoconazole (NIZORAL) 2 % shampoo Apply 1 application topically once a week.  . levothyroxine (SYNTHROID, LEVOTHROID) 50 MCG tablet Take 50 mcg by mouth daily before breakfast.   . Lidocaine (ASPERCREME LIDOCAINE) 4 % PTCH Apply 1 patch topically See admin instructions. 1 patch applied once a day to mid lower back  . losartan (COZAAR) 50 MG tablet Take 50 mg by mouth 2 (two) times daily. Hold for BP <110   . magic mouthwash w/lidocaine SOLN Take 5-10 mLs by mouth every 4 (four) hours as needed for mouth pain.  . magnesium citrate SOLN Take by mouth as needed for severe constipation. Per ED physician resident may take 10 oz by mouth followed by 8 oz of water as needed every 4th day if no bowel  movement in 3 days  . Misc. Devices (SLEEPRIGHT BREATHE AID) MISC 1 strip at bedtime.  Marland Kitchen omeprazole (PRILOSEC OTC) 20 MG tablet Take 20 mg by mouth daily.  Marland Kitchen oxyCODONE (OXYCONTIN) 10 mg 12 hr tablet Take 1 tablet (10 mg total) by mouth daily.  . polyethylene glycol (MIRALAX / GLYCOLAX) packet Take 17 g by mouth 2 (two) times daily.   . sennosides-docusate sodium (SENOKOT-S) 8.6-50 MG tablet Take 2 tablets by mouth at bedtime.  . sodium chloride (OCEAN) 0.65 % SOLN nasal spray Place 1 spray into both nostrils 3 (three) times daily as needed for congestion.  . triamcinolone (KENALOG) 0.1 % paste Use as directed 1 application in the mouth or throat every 4 (four) hours as needed.  . Nutritional Supplements (FEEDING SUPPLEMENT, BOOST BREEZE,) LIQD Take 1 Can by mouth 2 (two) times daily between meals.   No facility-administered encounter medications on file as of 06/29/2017.     Review  of Systems  Constitutional: Negative for appetite change, chills, fatigue and fever.  HENT: Positive for hearing loss. Negative for congestion, sinus pressure, sinus pain, sneezing and sore throat.        Seen prior to visit by ENT for nasal spray ordered.wears dentures   Eyes: Positive for visual disturbance. Negative for discharge, redness and itching.       Wears eye glasses  Respiratory: Negative for chest tightness, shortness of breath and wheezing.        Chronic cough.   Cardiovascular: Positive for leg swelling. Negative for chest pain and palpitations.  Gastrointestinal: Negative for abdominal distention, abdominal pain, constipation, diarrhea, nausea and vomiting.  Musculoskeletal: Positive for arthralgias, back pain and gait problem.  Skin: Negative for color change, pallor, rash and wound.  Neurological: Negative for dizziness, light-headedness and headaches.  Psychiatric/Behavioral: Negative for agitation, confusion and sleep disturbance. The patient is not nervous/anxious.     Immunization History   Administered Date(s) Administered  . Influenza-Unspecified 12/27/2010, 12/29/2011, 12/27/2012, 12/13/2013, 12/14/2014, 12/20/2015, 12/17/2016  . PPD Test 07/30/2010, 08/14/2010  . Pneumococcal Conjugate-13 11/04/2016  . Pneumococcal Polysaccharide-23 03/10/2005  . Td 03/11/1999  . Tdap 10/31/2016   Pertinent  Health Maintenance Due  Topic Date Due  . INFLUENZA VACCINE  10/08/2017  . PNA vac Low Risk Adult  Completed   Fall Risk  10/29/2016 08/07/2015 08/07/2015 04/10/2015 03/27/2015  Falls in the past year? Yes Yes No No No  Comment - 06/02/15- skin tear right arm - - -  Number falls in past yr: 1 - - - -  Injury with Fall? No - - - -    Vitals:   06/29/17 1312  BP: (!) 182/86  Pulse: 68  Resp: 20  Temp: (!) 96.8 F (36 C)  SpO2: 98%  Weight: 136 lb (61.7 kg)  Height: 5\' 9"  (1.753 m)   Body mass index is 20.08 kg/m. Physical Exam  Constitutional: He is oriented to person, place, and time. He appears well-developed.  Elderly in no acute distress   HENT:  Head: Normocephalic.  Right Ear: External ear normal.  Left Ear: External ear normal.  Mouth/Throat: Oropharynx is clear and moist. No oropharyngeal exudate.  Hearing aids and dentures in place   Eyes: Pupils are equal, round, and reactive to light. Conjunctivae and EOM are normal. Right eye exhibits no discharge. Left eye exhibits no discharge. No scleral icterus.  Eye glasses in place   Neck: Normal range of motion. No tracheal deviation present. No thyromegaly present.  Cardiovascular: Intact distal pulses. An irregular rhythm present. Exam reveals no gallop and no friction rub.  Murmur heard. Pulmonary/Chest: Effort normal and breath sounds normal. No stridor. No respiratory distress. He has no wheezes. He has no rales.  Abdominal: Soft. Bowel sounds are normal. He exhibits no distension and no mass. There is no tenderness. There is no rebound and no guarding.  Musculoskeletal:  Moves x 4 extremities.unsteady gait  uses FWW for short distance and power wheelchair for long distance.bilateral lower extremities pitting edema.   Lymphadenopathy:    He has no cervical adenopathy.  Neurological: He is oriented to person, place, and time. Gait abnormal.  Skin: Skin is warm and dry. No rash noted. No erythema. No pallor.  Psychiatric: He has a normal mood and affect. His behavior is normal.   Labs reviewed: Recent Labs    04/24/17 0649 04/25/17 0410 05/06/17 2226 06/04/17  NA 136 135 136 140  140  K 4.7 4.1 4.8  4.7  4.7  CL 103 103 105 108  CO2 24 24 22 24   GLUCOSE 106* 95 93  --   BUN 23* 20 27* 33*  CREATININE 1.13 1.10 1.08 1.1  1.11  CALCIUM 8.7* 8.4* 8.5* 9.3  9.3  MG 2.2  --   --   --   PHOS 3.5  --   --   --    Recent Labs    04/16/17 04/23/17 1231 05/06/17 2226  AST 16 23 19   ALT 7 11* 13*  ALKPHOS 87 105 96  BILITOT 0.6 1.1 0.6  PROT 6.1 7.0 6.3*  ALBUMIN 3.6 3.8 3.4*   Recent Labs    04/05/17 1846  04/23/17 1231 04/24/17 0649 04/25/17 0410 05/06/17 2226 06/18/17  WBC 7.5   < > 8.2 6.4 6.7 5.8 4.7  NEUTROABS 6.3  --  7.0  --   --  3.0  --   HGB 12.0*   < > 11.4* 11.1* 10.6* 9.6* 9.7  HCT 36.6*   < > 35.1* 34.0* 32.1* 29.3* 29.3  MCV 88.6   < > 88.9 89.7 89.4 88.8  --   PLT 160   < > 147* 158 154 208  --    < > = values in this interval not displayed.   Lab Results  Component Value Date   TSH 3.131 05/06/2017   Lab Results  Component Value Date   HGBA1C 5.6 04/06/2017   Lab Results  Component Value Date   CHOL 145 04/06/2017   HDL 47 04/06/2017   LDLCALC 89 04/06/2017   TRIG 47 04/06/2017   CHOLHDL 3.1 04/06/2017    Significant Diagnostic Results in last 30 days:  No results found.  Assessment/Plan  1. Essential hypertension B/p reviewed readings elevated 140's/70's-170's/90's.continue on losartan 50 mg tablet twice daily.start on hydralazine 25 mg tablet one by mouth twice daily.monitor blood pressure readings twice daily.Will increase hydralazine to  50 mg tablet twice daily if B/p still not under control.   2. Edema  Bilateral lower extremities pitting edema.Apply knee high ted hose on in the morning and off at bedtime.  Family/ staff Communication: Reviewed plan of care with Dr. Burnice LoganPandey,patient and facility Nurse supervisor.  Labs/tests ordered: None   Oceanna Arruda C Demontay Grantham, NP

## 2017-09-04 ENCOUNTER — Other Ambulatory Visit: Payer: Self-pay

## 2017-09-04 MED ORDER — OXYCODONE HCL ER 10 MG PO T12A: 10.0000 mg | EXTENDED_RELEASE_TABLET | Freq: Every day | ORAL | 0 refills | Status: DC

## 2017-09-16 ENCOUNTER — Encounter: Payer: Self-pay | Admitting: Family

## 2017-09-16 ENCOUNTER — Non-Acute Institutional Stay: Payer: Medicare PPO | Admitting: Family

## 2017-09-16 DIAGNOSIS — M150 Primary generalized (osteo)arthritis: Secondary | ICD-10-CM | POA: Diagnosis not present

## 2017-09-16 DIAGNOSIS — M159 Polyosteoarthritis, unspecified: Secondary | ICD-10-CM

## 2017-09-16 DIAGNOSIS — N183 Chronic kidney disease, stage 3 unspecified: Secondary | ICD-10-CM

## 2017-09-16 DIAGNOSIS — E039 Hypothyroidism, unspecified: Secondary | ICD-10-CM | POA: Diagnosis not present

## 2017-09-16 DIAGNOSIS — K5909 Other constipation: Secondary | ICD-10-CM

## 2017-09-16 DIAGNOSIS — I10 Essential (primary) hypertension: Secondary | ICD-10-CM

## 2017-09-16 DIAGNOSIS — K219 Gastro-esophageal reflux disease without esophagitis: Secondary | ICD-10-CM

## 2017-09-16 NOTE — Progress Notes (Signed)
Location:  [facility] Room Number: 32 Place of Service:  ALF [phone] Provider: Kagan Mutchler FNP-C   Oneal GroutPandey, Mahima, MD  Patient Care Team: Oneal GroutPandey, Mahima, MD as PCP - General (Internal Medicine) Vashon Arch, Donalee Citrininah C, NP as Nurse Practitioner (Family Medicine)  Extended Emergency Contact Information Primary Emergency Contact: [name],Phillip          [place], [place] Home Phone: [phone] Mobile Phone: [phone] Relation: Son Secondary Emergency Contact: [name],[name]  [place] Mobile Phone: [phone] Relation: Grandson  Code Status: DNR Goals of care: Advanced Directive information Advanced Directives 09/16/2017  Does Patient Have a Medical Advance Directive? Yes  Type of Estate agentAdvance Directive Healthcare Power of [place];Out of facility DNR (pink MOST or yellow form);Living will  Does patient want to make changes to medical advance directive? -  Copy of Healthcare Power of Attorney in Chart? Yes  Would patient like information on creating a medical advance directive? -  Pre-existing out of facility DNR order (yellow form or pink MOST form) Yellow form placed in chart (order not valid for inpatient use);Pink MOST form placed in chart (order not valid for inpatient use)     Chief Complaint  Patient presents with  . Medical Management of Chronic Issues    HPI:  Pt is a [age] y.o. male seen today [facility] for medical management of chronic diseases.He has a medical history of HTN,AAA without rapture,GERD,Hypothyroidism,OA,BPH,CKD stage 3,depression with anxiety among other conditions.He is seen in his room today with facility Nurse present at bedside.He states had left inner thigh pain for the past few days but has applied arthritis medication ( Aspercreme) with much improvement.He has had no recent fall episode.No weight loss.Facility Nurse reports no new concerns.    Past Medical History:  Diagnosis Date  .  AAA (abdominal aortic aneurysm) (HCC)   . Abnormality of gait 08/01/2010  . Adhesive capsulitis of shoulder 08/01/2010   "Frozen" right shoulder  . Anemia   . Anxiety state, unspecified 08/01/2010  . Arthritis   . Congestive heart failure, unspecified 08/01/2010  . Cough 08/07/2015  . Disorders of bursae and tendons in shoulder region, unspecified 08/01/2010  . Edema 08/01/2010  . Fall   . Fecal impaction (HCC)   . GERD (gastroesophageal reflux disease)   . Hiatal hernia   . Hypercalcemia   . Hyperlipidemia   . Hypertension   . Hypertrophy of prostate without urinary obstruction and other lower urinary tract symptoms (LUTS) 08/01/2010  . Hyponatremia   . Hypothyroidism   . Infectious colitis   . Insomnia, unspecified 08/01/2010  . Joint pain   . Leg pain    with walking  . Lumbago 12/24/2010  . Orthostatic hypotension 11/05/2010  . Other specified cardiac dysrhythmias(427.89) 12/24/2010  . Pain in joint, pelvic region and thigh 08/06/2010  . Peptic ulcer, unspecified site, unspecified as acute or chronic, without mention of hemorrhage, perforation, or obstruction 08/01/2010  . Renal failure, acute (HCC)   . Swelling, mass, or lump in head and neck 01/03/2011  . Ulcer    Stomach  . Unspecified disorder of kidney and ureter 08/01/2010  . Unspecified hearing loss 08/01/2010  . Unspecified vitamin D deficiency 08/06/2010  . Urinary frequency 09/13/2010  . Vertigo    Past Surgical History:  Procedure Laterality Date  . ABDOMINAL AORTIC ANEURYSM REPAIR  12-18-10   Stent graft repair of AAA  . APPENDECTOMY    . CATARACT EXTRACTION    . HEMORRHOID  SURGERY  2000  . INGUINAL HERNIA REPAIR Left 2005  . TRANSTHORACIC ECHOCARDIOGRAM  07/19/2010    Left ventricle: There is hypokinesis of the inferior wall and  posterior wall. The EF is 35-40%. The cavity size was normal    Allergies  Allergen Reactions  . Aleve [Naproxen] Other (See Comments)    Per MAR  . Indocin [Indomethacin] Other (See  Comments)    Per MAR    Allergies as of 09/16/2017      Reactions   Aleve [naproxen] Other (See Comments)   Per MAR   Indocin [indomethacin] Other (See Comments)   Per [facility]      Medication List        Accurate as of 09/16/17  2:34 PM. Always use your most recent med list.          acetaminophen 500 MG tablet Commonly known as:  TYLENOL 1000 mg po bid and 500 mg q8h prn for pain   ASPERCREME LIDOCAINE 4 % Ptch Generic drug:  Lidocaine Apply 1 patch topically See admin instructions. 1 patch applied once a day to mid lower back   benzocaine 10 % mucosal gel Commonly known as:  ORAJEL Use as directed 1 application in the mouth or throat as needed for mouth pain.   bisacodyl 10 MG suppository Commonly known as:  DULCOLAX Place 10 mg rectally every other day. For constipation   DEXTRAN 70-HYPROMELLOSE (PF) OP Place 1 drop into both eyes as needed (for dry eyes).   feeding supplement (BOOST BREEZE) Liqd Take 1 Can by mouth 2 (two) times daily between meals.   finasteride 5 MG tablet Commonly known as:  PROSCAR Take 5 mg by mouth daily.   fluocinonide 0.05 % external solution Commonly known as:  LIDEX Apply 1 application topically as needed.   gabapentin 100 MG capsule Commonly known as:  NEURONTIN Take 100 mg by mouth daily with supper.   hydrALAZINE 25 MG tablet Commonly known as:  APRESOLINE Take 25 mg by mouth 2 (two) times daily.   hydrocortisone 1 % lotion Apply 1 application topically as needed for itching.   ketoconazole 2 % shampoo Commonly known as:  NIZORAL Apply 1 application topically once a week.   levothyroxine 50 MCG tablet Commonly known as:  SYNTHROID, LEVOTHROID Take 50 mcg by mouth daily before breakfast.   losartan 50 MG tablet Commonly known as:  COZAAR Take 50 mg by mouth 2 (two) times daily. Hold for BP <110   magic mouthwash w/lidocaine Soln Take 5-10 mLs by mouth every 4 (four) hours as needed for mouth pain.   magnesium  citrate Soln Take by mouth as needed for severe constipation. Per ED physician resident may take 10 oz by mouth followed by 8 oz of water as needed every 4th day if no bowel movement in 3 days   MINTOX 200-200-20 MG/5ML suspension Generic drug:  alum & mag hydroxide-simeth Take 30 mLs by mouth every 4 (four) hours as needed for indigestion or heartburn (for 48 hours and call MD if symptoms persist).   omeprazole 20 MG tablet Commonly known as:  PRILOSEC OTC Take 20 mg by mouth daily.   oxyCODONE 10 mg 12 hr tablet Commonly known as:  OXYCONTIN Take 1 tablet (10 mg total) by mouth daily.   polyethylene glycol packet Commonly known as:  MIRALAX / GLYCOLAX Take 17 g by mouth 2 (two) times daily.   sennosides-docusate sodium 8.6-50 MG tablet Commonly known as:  SENOKOT-S Take 2 tablets  by mouth at bedtime.   SLEEPRIGHT BREATHE AID Misc 1 strip at bedtime.   sodium chloride 0.65 % Soln nasal spray Commonly known as:  OCEAN Place 1 spray into both nostrils 3 (three) times daily as needed for congestion.   triamcinolone 0.1 % paste Commonly known as:  KENALOG Use as directed 1 application in the mouth or throat every 4 (four) hours as needed.   TUSSIN COUGH DM PO Take 5 mLs by mouth every 4 (four) hours as needed.   Vitamin D3 2000 units Tabs Take 2,000 Units by mouth daily.       Review of Systems  Constitutional: Negative for activity change, chills, fatigue and fever.  HENT: Positive for hearing loss. Negative for congestion, rhinorrhea, sinus pressure, sneezing and sore throat.   Eyes: Positive for visual disturbance. Negative for discharge and redness.       Wears eye glasses   Respiratory: Negative for cough, chest tightness, shortness of breath and wheezing.   Cardiovascular: Negative for chest pain, palpitations and leg swelling.  Gastrointestinal: Negative for abdominal distention, abdominal pain, constipation, diarrhea, nausea and vomiting.  Endocrine: Negative  for cold intolerance, heat intolerance, polydipsia, polyphagia and polyuria.  Genitourinary: Negative for dysuria, flank pain, frequency and urgency.  Musculoskeletal: Positive for arthralgias, back pain and gait problem.  Skin: Negative for color change, pallor, rash and wound.  Neurological: Negative for dizziness, light-headedness and headaches.  Hematological: Does not bruise/bleed easily.  Psychiatric/Behavioral: Negative for agitation, confusion and sleep disturbance. The patient is not nervous/anxious.     Immunization History  Administered Date(s) Administered  . Influenza-Unspecified 12/27/2010, 12/29/2011, 12/27/2012, 12/13/2013, 12/14/2014, 12/20/2015, 12/17/2016  . PPD Test 07/30/2010, 08/14/2010  . Pneumococcal Conjugate-13 11/04/2016  . Pneumococcal Polysaccharide-23 03/10/2005  . Td 03/11/1999  . Tdap 10/31/2016   Pertinent  Health Maintenance Due  Topic Date Due  . INFLUENZA VACCINE  10/08/2017  . PNA vac Low Risk Adult  Completed   Fall Risk  10/29/2016 08/07/2015 08/07/2015 04/10/2015 03/27/2015  Falls in the past year? Yes Yes No No No  Comment - 06/02/15- skin tear right arm - - -  Number falls in past yr: 1 - - - -  Injury with Fall? No - - - -   Functional Status Survey:  Vitals:   09/16/17 1408  BP: 140/65  Pulse: 66  Resp: 16  Temp: 98.7 F (37.1 C)  SpO2: 97%  Weight: 143 lb (64.9 kg)  Height: 5\' 9"  (1.753 m)   Body mass index is 21.12 kg/m. Physical Exam  Constitutional:  Frail elderly in no acute distress   HENT:  Head: Normocephalic.  Right Ear: External ear normal.  Left Ear: External ear normal.  Mouth/Throat: Oropharynx is clear and moist. No oropharyngeal exudate.  Eyes: Pupils are equal, round, and reactive to light. Conjunctivae and EOM are normal. Right eye exhibits no discharge. Left eye exhibits no discharge. No scleral icterus.  Neck: Normal range of motion. No JVD present. No thyromegaly present.  Cardiovascular: Normal rate,  regular rhythm, normal heart sounds and intact distal pulses. Exam reveals no gallop and no friction rub.  No murmur heard. Pulmonary/Chest: Effort normal and breath sounds normal. No respiratory distress. He has no wheezes. He has no rales.  Abdominal: Soft. Bowel sounds are normal. He exhibits no distension and no mass. There is no tenderness. There is no rebound and no guarding.  Musculoskeletal: He exhibits no edema or tenderness.  Unsteady gait uses power wheelchair and FWW for short  distance.   Lymphadenopathy:    He has no cervical adenopathy.  Neurological: Gait abnormal.  Very hard of hearing.wears bilateral hearing aids.   Skin: Skin is warm and dry. No rash noted. No erythema. No pallor.  Psychiatric: He has a normal mood and affect. His speech is normal and behavior is normal. Judgment and thought content normal.  Nursing note and vitals reviewed.   Labs reviewed: Recent Labs    04/24/17 0649 04/25/17 0410 05/06/17 2226 06/04/17  NA 136 135 136 140  140  K 4.7 4.1 4.8 4.7  4.7  CL 103 103 105 108  CO2 24 24 22 24   GLUCOSE 106* 95 93  --   BUN 23* 20 27* 33*  CREATININE 1.13 1.10 1.08 1.1  1.11  CALCIUM 8.7* 8.4* 8.5* 9.3  9.3  MG 2.2  --   --   --   PHOS 3.5  --   --   --    Recent Labs    04/16/17 04/23/17 1231 05/06/17 2226  AST 16 23 19   ALT 7 11* 13*  ALKPHOS 87 105 96  BILITOT 0.6 1.1 0.6  PROT 6.1 7.0 6.3*  ALBUMIN 3.6 3.8 3.4*   Recent Labs    04/05/17 1846  04/23/17 1231 04/24/17 0649 04/25/17 0410 05/06/17 2226 06/18/17  WBC 7.5   < > 8.2 6.4 6.7 5.8 4.7  NEUTROABS 6.3  --  7.0  --   --  3.0  --   HGB 12.0*   < > 11.4* 11.1* 10.6* 9.6* 9.7  HCT 36.6*   < > 35.1* 34.0* 32.1* 29.3* 29.3  MCV 88.6   < > 88.9 89.7 89.4 88.8  --   PLT 160   < > 147* 158 154 208  --    < > = values in this interval not displayed.   Lab Results  Component Value Date   TSH 3.131 05/06/2017   Lab Results  Component Value Date   HGBA1C 5.6 04/06/2017    Lab Results  Component Value Date   CHOL 145 04/06/2017   HDL 47 04/06/2017   LDLCALC 89 04/06/2017   TRIG 47 04/06/2017   CHOLHDL 3.1 04/06/2017    Significant Diagnostic Results in last 30 days:  No results found.  Assessment/Plan 1. Essential hypertension B/p reviewed stable.continue on losartan 50 mg tablet twice daily and Hydralazine 25 mg tablet twice daily.continue to monitor.check CBC and CMP 09/21/2017.  2. Hypothyroidism Lab Results  Component Value Date   TSH 3.131 05/06/2017  Continue on levothyroxine 50 mcg tablet daily.check TSH level 09/21/2017.   3. Chronic kidney disease, stage III (moderate)  CR 1.11 ( 05/25/2017).continue to monitor.check CMP as above.  4. Gastroesophageal reflux disease without esophagitis Asymptomatic.continue on omeprazole 20 mg tablet daily.will monitor mg and vit B12 level.   5. Chronic constipation Current regimen effective.continue to encourage oral intake and hydration.   6. Primary osteoarthritis involving multiple joints Current pain regimen effective.continue to monitor.  Family/ staff Communication: Reviewed plan of care with patient and facility Nurse supervisor   Labs/tests ordered: CBC,CMP and TSH level 09/21/2017  Caesar Bookman, NP

## 2017-09-17 DIAGNOSIS — N390 Urinary tract infection, site not specified: Secondary | ICD-10-CM | POA: Diagnosis not present

## 2017-09-21 DIAGNOSIS — E039 Hypothyroidism, unspecified: Secondary | ICD-10-CM | POA: Diagnosis not present

## 2017-09-21 DIAGNOSIS — N183 Chronic kidney disease, stage 3 (moderate): Secondary | ICD-10-CM | POA: Diagnosis not present

## 2017-09-21 DIAGNOSIS — I10 Essential (primary) hypertension: Secondary | ICD-10-CM | POA: Diagnosis not present

## 2017-09-21 LAB — HEPATIC FUNCTION PANEL
ALK PHOS: 110 (ref 25–125)
ALT: 9 — AB (ref 10–40)
AST: 16 (ref 14–40)
BILIRUBIN, TOTAL: 0.6

## 2017-09-21 LAB — BASIC METABOLIC PANEL
BUN: 34 — AB (ref 4–21)
Creatinine: 1.3 (ref 0.6–1.3)
GLUCOSE: 97
POTASSIUM: 5 (ref 3.4–5.3)
SODIUM: 135 — AB (ref 137–147)

## 2017-09-21 LAB — CBC AND DIFFERENTIAL
HEMATOCRIT: 27 — AB (ref 41–53)
HEMOGLOBIN: 9.3 — AB (ref 13.5–17.5)
NEUTROS ABS: 6552
Platelets: 161 (ref 150–399)
WBC: 9.1

## 2017-09-21 LAB — CMP 10231
ALBUMIN: 3.7
Calcium: 8.6
Carbon Dioxide, Total: 20
Chloride: 106
GFR CALC NON AF AMER: 45
Globulin: 2.3
TOTAL PROTEIN: 6

## 2017-09-21 LAB — TSH: TSH: 2.84 (ref 0.41–5.90)

## 2017-09-26 ENCOUNTER — Emergency Department (HOSPITAL_COMMUNITY)
Admission: EM | Admit: 2017-09-26 | Discharge: 2017-09-26 | Disposition: A | Discharge status: Home / Self Care | Payer: Medicare PPO | Attending: Emergency Medicine | Admitting: Emergency Medicine

## 2017-09-26 ENCOUNTER — Encounter (HOSPITAL_COMMUNITY): Payer: Self-pay

## 2017-09-26 ENCOUNTER — Other Ambulatory Visit: Payer: Self-pay

## 2017-09-26 ENCOUNTER — Emergency Department (HOSPITAL_COMMUNITY): Payer: Medicare PPO

## 2017-09-26 DIAGNOSIS — N183 Chronic kidney disease, stage 3 (moderate): Secondary | ICD-10-CM | POA: Diagnosis not present

## 2017-09-26 DIAGNOSIS — E039 Hypothyroidism, unspecified: Secondary | ICD-10-CM | POA: Insufficient documentation

## 2017-09-26 DIAGNOSIS — Z8679 Personal history of other diseases of the circulatory system: Secondary | ICD-10-CM | POA: Diagnosis not present

## 2017-09-26 DIAGNOSIS — R011 Cardiac murmur, unspecified: Secondary | ICD-10-CM | POA: Diagnosis not present

## 2017-09-26 DIAGNOSIS — I509 Heart failure, unspecified: Secondary | ICD-10-CM | POA: Diagnosis not present

## 2017-09-26 DIAGNOSIS — I130 Hypertensive heart and chronic kidney disease with heart failure and stage 1 through stage 4 chronic kidney disease, or unspecified chronic kidney disease: Secondary | ICD-10-CM | POA: Insufficient documentation

## 2017-09-26 DIAGNOSIS — N401 Enlarged prostate with lower urinary tract symptoms: Secondary | ICD-10-CM | POA: Insufficient documentation

## 2017-09-26 DIAGNOSIS — I959 Hypotension, unspecified: Secondary | ICD-10-CM | POA: Diagnosis not present

## 2017-09-26 DIAGNOSIS — R1030 Lower abdominal pain, unspecified: Secondary | ICD-10-CM | POA: Diagnosis not present

## 2017-09-26 DIAGNOSIS — Z79899 Other long term (current) drug therapy: Secondary | ICD-10-CM | POA: Insufficient documentation

## 2017-09-26 DIAGNOSIS — N390 Urinary tract infection, site not specified: Secondary | ICD-10-CM | POA: Diagnosis not present

## 2017-09-26 DIAGNOSIS — R1084 Generalized abdominal pain: Secondary | ICD-10-CM | POA: Diagnosis not present

## 2017-09-26 DIAGNOSIS — R1032 Left lower quadrant pain: Secondary | ICD-10-CM | POA: Diagnosis not present

## 2017-09-26 LAB — CBC WITH DIFFERENTIAL/PLATELET
Basophils Absolute: 0 10*3/uL (ref 0.0–0.1)
Basophils Relative: 0 %
Eosinophils Absolute: 0.1 10*3/uL (ref 0.0–0.7)
Eosinophils Relative: 1 %
HEMATOCRIT: 29.2 % — AB (ref 39.0–52.0)
Hemoglobin: 9.4 g/dL — ABNORMAL LOW (ref 13.0–17.0)
LYMPHS ABS: 1.1 10*3/uL (ref 0.7–4.0)
LYMPHS PCT: 16 %
MCH: 30.2 pg (ref 26.0–34.0)
MCHC: 32.2 g/dL (ref 30.0–36.0)
MCV: 93.9 fL (ref 78.0–100.0)
MONO ABS: 1.3 10*3/uL — AB (ref 0.1–1.0)
MONOS PCT: 18 %
NEUTROS ABS: 4.4 10*3/uL (ref 1.7–7.7)
NEUTROS PCT: 65 %
Platelets: 167 10*3/uL (ref 150–400)
RBC: 3.11 MIL/uL — ABNORMAL LOW (ref 4.22–5.81)
RDW: 13.2 % (ref 11.5–15.5)
WBC: 6.9 10*3/uL (ref 4.0–10.5)

## 2017-09-26 LAB — URINALYSIS, ROUTINE W REFLEX MICROSCOPIC
BILIRUBIN URINE: NEGATIVE
Glucose, UA: NEGATIVE mg/dL
Ketones, ur: NEGATIVE mg/dL
Nitrite: NEGATIVE
PH: 5 (ref 5.0–8.0)
Protein, ur: 100 mg/dL — AB
SPECIFIC GRAVITY, URINE: 1.013 (ref 1.005–1.030)
WBC, UA: >50 WBC/hpf — ABNORMAL HIGH (ref 0–5)

## 2017-09-26 LAB — COMPREHENSIVE METABOLIC PANEL
ALBUMIN: 3.5 g/dL (ref 3.5–5.0)
ALT: 11 U/L (ref 0–44)
ANION GAP: 6 (ref 5–15)
AST: 19 U/L (ref 15–41)
Alkaline Phosphatase: 104 U/L (ref 38–126)
BUN: 54 mg/dL — ABNORMAL HIGH (ref 8–23)
CO2: 22 mmol/L (ref 22–32)
Calcium: 8.5 mg/dL — ABNORMAL LOW (ref 8.9–10.3)
Chloride: 111 mmol/L (ref 98–111)
Creatinine, Ser: 1.47 mg/dL — ABNORMAL HIGH (ref 0.61–1.24)
GFR calc Af Amer: 43 mL/min — ABNORMAL LOW (ref >60)
GFR calc non Af Amer: 37 mL/min — ABNORMAL LOW (ref >60)
GLUCOSE: 114 mg/dL — AB (ref 70–99)
POTASSIUM: 5 mmol/L (ref 3.5–5.1)
Sodium: 139 mmol/L (ref 135–145)
Total Bilirubin: 0.4 mg/dL (ref 0.3–1.2)
Total Protein: 6.3 g/dL — ABNORMAL LOW (ref 6.5–8.1)

## 2017-09-26 LAB — LIPASE, BLOOD: Lipase: 29 U/L (ref 11–51)

## 2017-09-26 LAB — I-STAT CG4 LACTIC ACID, ED: Lactic Acid, Venous: 1.39 mmol/L (ref 0.5–1.9)

## 2017-09-26 MED ORDER — SODIUM CHLORIDE 0.9 % IV BOLUS
500.0000 mL | Freq: Once | INTRAVENOUS | Status: AC
  Administered 2017-09-26: 500 mL via INTRAVENOUS

## 2017-09-26 MED ORDER — FENTANYL CITRATE (PF) 100 MCG/2ML IJ SOLN
50.0000 ug | Freq: Once | INTRAMUSCULAR | Status: AC
  Administered 2017-09-26: 50 ug via INTRAVENOUS
  Filled 2017-09-26: qty 2

## 2017-09-26 MED ORDER — CEFTRIAXONE SODIUM 1 G IJ SOLR
1.0000 g | Freq: Once | INTRAMUSCULAR | Status: AC
  Administered 2017-09-26: 1 g via INTRAVENOUS
  Filled 2017-09-26: qty 10

## 2017-09-26 MED ORDER — CEPHALEXIN 500 MG PO CAPS: 500.0000 mg | ORAL_CAPSULE | Freq: Two times a day (BID) | ORAL | 0 refills | Status: DC

## 2017-09-26 NOTE — Discharge Instructions (Addendum)
RETURN TO ER IF YOU HAVE ANY FEVERS, VOMITING, WORSENING PAIN, OR CONFUSION. SEE PRIMARY CARE PROVIDER NEXT WEEK FOR RECHECK.

## 2017-09-26 NOTE — ED Notes (Signed)
0ml with post void residual

## 2017-09-26 NOTE — ED Triage Notes (Signed)
Patient arrives via EMS with c/o LLQ abdominal pain that radiates into groin x2 days and left leg. Hurts worse when laying down at night. -N/V/D, strong smell of urine. Hx hernia repair surgery. Denies blood in stool or urine.

## 2017-09-26 NOTE — ED Provider Notes (Signed)
[facility]-EMERGENCY DEPT Provider Note   CSN: [id number] Arrival date & time: 09/26/17  0932     History   Chief Complaint Chief Complaint  Patient presents with  . Abdominal Pain    HPI Ricardo [name] is a 84 y.o. male.  84yo M w/ PMH including AAA, CHF, HTN, BPH who p/w abdominal pain.  History severely limited as patient is extremely hard of hearing and all communication had to be written down on paper.  He reports 2 days of intermittent left lower quadrant pain that radiates down into his groin.  The pain is worse at night.  He was not able to sleep last night due to the severity of the pain.  Currently his pain is minimal.  He denies any tenderness or swelling of testicle.  He has h/o inguinal hernia repair on L. He has noticed some dysuria and urgency.  He denies any nausea, vomiting, diarrhea, or fevers.  He has been eating and drinking normally.  LEVEL 5 CAVEAT DUE TO HEARING LOSS  The history is provided by the patient.  Abdominal Pain      Past Medical History:  Diagnosis Date  . AAA (abdominal aortic aneurysm) (HCC)   . Abnormality of gait 08/01/2010  . Adhesive capsulitis of shoulder 08/01/2010   "Frozen" right shoulder  . Anemia   . Anxiety state, unspecified 08/01/2010  . Arthritis   . Congestive heart failure, unspecified 08/01/2010  . Cough 08/07/2015  . Disorders of bursae and tendons in shoulder region, unspecified 08/01/2010  . Edema 08/01/2010  . Fall   . Fecal impaction (HCC)   . GERD (gastroesophageal reflux disease)   . Hiatal hernia   . Hypercalcemia   . Hyperlipidemia   . Hypertension   . Hypertrophy of prostate without urinary obstruction and other lower urinary tract symptoms (LUTS) 08/01/2010  . Hyponatremia   . Hypothyroidism   . Infectious colitis   . Insomnia, unspecified 08/01/2010  . Joint pain   . Leg pain    with walking  . Lumbago 12/24/2010  . Orthostatic hypotension 11/05/2010  . Other specified cardiac  dysrhythmias(427.89) 12/24/2010  . Pain in joint, pelvic region and thigh 08/06/2010  . Peptic ulcer, unspecified site, unspecified as acute or chronic, without mention of hemorrhage, perforation, or obstruction 08/01/2010  . Renal failure, acute (HCC)   . Swelling, mass, or lump in head and neck 01/03/2011  . Ulcer    Stomach  . Unspecified disorder of kidney and ureter 08/01/2010  . Unspecified hearing loss 08/01/2010  . Unspecified vitamin D deficiency 08/06/2010  . Urinary frequency 09/13/2010  . Vertigo     Patient Active Problem List   Diagnosis Date Noted  . SBO (small bowel obstruction) (HCC) 04/23/2017  . Protein-calorie malnutrition, severe 04/07/2017  . Nausea and vomiting 04/07/2017  . Nausea & vomiting 04/05/2017  . Tachycardia 04/05/2017  . Neuropathic pain 12/08/2016  . Anemia of chronic disease 12/08/2016  . DNR (do not resuscitate)   . Cough 08/07/2015  . Chronic mucus hypersecretion, respiratory 04/10/2015  . Xerostomia 03/27/2015  . Edema 03/27/2015  . Bradycardia by electrocardiogram 09/22/2014  . Spinal stenosis in cervical region 09/06/2013  . Deaf 06/21/2013  . Lumbago 06/21/2013  . Constipation 03/08/2013  . Hypothyroidism 03/08/2013  . BPH (benign prostatic hyperplasia) 03/08/2013  . Depression with anxiety 03/08/2013  . Fall at nursing home 03/08/2013  . AAA (abdominal aortic aneurysm) (HCC) 01/27/2012  . Abnormality of gait 08/01/2010  . Osteoarthritis  05/17/2009  . GERD 03/16/2009  . ULCER-GASTRIC 03/16/2009  . DIVERTICULOSIS-COLON 03/16/2009  . Dyslipidemia 01/30/2009  . Iron deficiency anemia 01/30/2009  . Essential hypertension 01/30/2009  . VERTIGO 01/30/2009  . Chronic kidney disease, stage III (moderate) (HCC) 01/30/2009    Past Surgical History:  Procedure Laterality Date  . ABDOMINAL AORTIC ANEURYSM REPAIR  12-18-10   Stent graft repair of AAA  . APPENDECTOMY    . CATARACT EXTRACTION    . HEMORRHOID SURGERY  2000  . INGUINAL HERNIA  REPAIR Left 2005  . TRANSTHORACIC ECHOCARDIOGRAM  07/19/2010    Left ventricle: There is hypokinesis of the inferior wall and  posterior wall. The EF is 35-40%. The cavity size was normal        Home Medications    Prior to Admission medications   Medication Sig Start Date End Date Taking? Authorizing Provider  acetaminophen (TYLENOL) 500 MG tablet 1000 mg po bid and 500 mg q8h prn for pain 05/06/17  Yes Oneal GroutPandey, Mahima, MD  alum & mag hydroxide-simeth (MINTOX) 200-200-20 MG/5ML suspension Take 30 mLs by mouth every 4 (four) hours as needed for indigestion or heartburn (for 48 hours and call MD if symptoms persist).    Yes [provider]  benzocaine (ORAJEL) 10 % mucosal gel Use as directed 1 application in the mouth or throat as needed for mouth pain.   Yes [provider]  bisacodyl (DULCOLAX) 10 MG suppository Place 10 mg rectally every other day. For constipation   Yes [provider]  Cholecalciferol (VITAMIN D3) 2000 units TABS Take 2,000 Units by mouth daily.    Yes [provider]  DEXTRAN 70-HYPROMELLOSE, PF, OP Place 1 drop into both eyes as needed (for dry eyes).   Yes [provider]  Dextromethorphan-Guaifenesin (TUSSIN COUGH DM PO) Take 5 mLs by mouth every 4 (four) hours as needed.   Yes [provider]  finasteride (PROSCAR) 5 MG tablet Take 5 mg by mouth daily.  01/25/13  Yes [provider]  fluocinonide (LIDEX) 0.05 % external solution Apply 1 application topically as needed.   Yes [provider]  gabapentin (NEURONTIN) 100 MG capsule Take 100 mg by mouth daily with supper.    Yes [provider]  hydrALAZINE (APRESOLINE) 25 MG tablet Take 25 mg by mouth 2 (two) times daily.   Yes [provider]  hydrocortisone 1 % lotion Apply 1 application topically as needed for itching.   Yes [provider]  ketoconazole (NIZORAL) 2 % shampoo Apply 1 application topically once a week.    Yes [provider]  levothyroxine (SYNTHROID, LEVOTHROID) 50 MCG tablet Take 50 mcg by mouth daily before breakfast.  01/10/13  Yes [provider]  Lidocaine (ASPERCREME LIDOCAINE) 4 % PTCH Apply 1 patch topically See admin instructions. 1 patch applied once a day to mid lower back   Yes [provider]  losartan (COZAAR) 50 MG tablet Take 50 mg by mouth 2 (two) times daily. Hold for BP <110    Yes [provider]  magic mouthwash w/lidocaine SOLN Take 5-10 mLs by mouth every 4 (four) hours as needed for mouth pain.   Yes [provider]  magnesium citrate SOLN Take by mouth as needed for severe constipation. Per ED physician resident may take 10 oz by mouth followed by 8 oz of water as needed every 4th day if no bowel movement in 3 days   Yes [provider]  Misc. Devices (SLEEPRIGHT [place]  AID) MISC 1 strip at bedtime.   Yes [provider]  Nutritional Supplements (FEEDING SUPPLEMENT, BOOST BREEZE,) LIQD Take 1 Can by mouth 2 (two) times daily between meals.   Yes [provider]  omeprazole (PRILOSEC OTC) 20 MG tablet Take 20 mg by mouth daily.   Yes [provider]  oxyCODONE (OXYCONTIN) 10 mg 12 hr tablet Take 1 tablet (10 mg total) by mouth daily. 09/04/17  Yes Oneal Grout, MD  polyethylene glycol (MIRALAX / GLYCOLAX) packet Take 17 g by mouth 2 (two) times daily.    Yes [provider]  sennosides-docusate sodium (SENOKOT-S) 8.6-50 MG tablet Take 2 tablets by mouth at bedtime.   Yes [provider]  sodium chloride (OCEAN) 0.65 % SOLN nasal spray Place 1 spray into both nostrils 3 (three) times daily as needed for congestion.   Yes [provider]  triamcinolone (KENALOG) 0.1 % paste Use as directed 1 application in the mouth or throat every 4 (four) hours as needed.   Yes [provider]  cephALEXin (KEFLEX) 500 MG capsule Take 1 capsule (500 mg total) by mouth 2 (two)  times daily. 09/26/17   Little, Ambrose Finland, MD    Family History Family History  Problem Relation Age of Onset  . Heart failure Brother     Social History Social History   Tobacco Use  . Smoking status: Never Smoker  . Smokeless tobacco: Never Used  Substance Use Topics  . Alcohol use: Yes    Alcohol/week: 0.0 oz    Comment: 1 1/2 oz daily   . Drug use: No     Allergies   Aleve [naproxen] and Indocin [indomethacin]   Review of Systems Review of Systems  Unable to perform ROS: Other  Gastrointestinal: Positive for abdominal pain.  severe hearing loss   Physical Exam Updated Vital Signs BP (!) 172/77   Pulse 79   Temp 97.7 F (36.5 C) (Oral)   Resp 15   Ht 5\' 9"  (1.753 m)   Wt 64.9 kg (143 lb)   SpO2 93%   BMI 21.12 kg/m   Physical Exam  Constitutional: He is oriented to person, place, and time. He appears well-developed and well-nourished. No distress.  HENT:  Head: Normocephalic and atraumatic.  Moist mucous membranes  Eyes: Pupils are equal, round, and reactive to light. Conjunctivae are normal.  Neck: Neck supple.  Cardiovascular: Normal rate and regular rhythm.  Murmur heard. Pulmonary/Chest: Effort normal and breath sounds normal.  Abdominal: Soft. Bowel sounds are normal. He exhibits no distension. There is tenderness in the left lower quadrant. There is no rebound and no guarding. No hernia.  Genitourinary: Penis normal. Right testis shows no swelling and no tenderness. Left testis shows no swelling and no tenderness.  Musculoskeletal: He exhibits no edema.  Neurological: He is alert and oriented to person, place, and time.  Fluent speech  Skin: Skin is warm and dry.  Psychiatric: He has a normal mood and affect. Judgment normal.  Nursing note and vitals reviewed.    ED Treatments / Results  Labs (all labs ordered are listed, but only abnormal results are displayed) Labs Reviewed  COMPREHENSIVE METABOLIC PANEL - Abnormal; Notable for the  following components:      Result Value   Glucose, Bld 114 (*)    BUN 54 (*)    Creatinine, Ser 1.47 (*)    Calcium 8.5 (*)    Total Protein 6.3 (*)    GFR calc non Af  Amer 37 (*)    GFR calc Af Amer 43 (*)    All other components within normal limits  CBC WITH DIFFERENTIAL/PLATELET - Abnormal; Notable for the following components:   RBC 3.11 (*)    Hemoglobin 9.4 (*)    HCT 29.2 (*)    Monocytes Absolute 1.3 (*)    All other components within normal limits  URINALYSIS, ROUTINE W REFLEX MICROSCOPIC - Abnormal; Notable for the following components:   APPearance CLOUDY (*)    Hgb urine dipstick SMALL (*)    Protein, ur 100 (*)    Leukocytes, UA LARGE (*)    WBC, UA >50 (*)    Bacteria, UA MANY (*)    All other components within normal limits  URINE CULTURE  LIPASE, BLOOD  I-STAT CG4 LACTIC ACID, ED    EKG None  Radiology Ct Abdomen Pelvis Wo Contrast  Result Date: 09/26/2017 CLINICAL DATA:  Left lower quadrant pain radiating to groin 2 days. EXAM: CT ABDOMEN AND PELVIS WITHOUT CONTRAST TECHNIQUE: Multidetector CT imaging of the abdomen and pelvis was performed following the standard protocol without IV contrast. COMPARISON:  05/06/2017 and 04/23/2017 FINDINGS: Lower chest: Mild interstitial changes over the periphery of the lung bases. Small granuloma over the right middle lobe. Large hiatal hernia. Calcified plaque over the coronary arteries and thoracic aorta. Mild cardiomegaly. Hepatobiliary: Cholelithiasis. Mild heterogeneity of the liver without definite focal mass. Biliary tree is within normal. Pancreas: Normal. Spleen: Normal. Adrenals/Urinary Tract: Adrenal glands are normal. Kidneys are within normal in size without hydronephrosis or nephrolithiasis. Ureters and bladder are normal. Stomach/Bowel: Large hiatal hernia. Small bowel is within normal. Previous appendectomy. Mild fecal retention throughout the colon. Minimal fluid over the presacral region in the pelvis.  Vascular/Lymphatic: Moderate calcified plaque of the abdominal aorta. Evidence of patient's known infrarenal aortoiliac bifurcated stent graft which is unchanged. Patient's native aneurysm sac measures 9.3 cm (previously 8.8 cm). Known endoleak not well evaluated on this noncontrast study, although the area of the endoleak measures approximately 6.2 cm (previously approximately 5.1 cm). No adenopathy. Reproductive: Normal. Other: Small umbilical hernia containing only peritoneal fat. Musculoskeletal: Degenerative change of the spine and hips. IMPRESSION: No acute findings to explain patient's left lower quadrant/left groin pain. Evidence of patient's bifurcated aortoiliac stent graft unchanged. Interval enlargement of native aneurysm sac measuring 9.3 cm (previously 8.8 cm). Known endoleak not well evaluated on this noncontrast study but does appear to be larger measuring 6.2 cm (previously 5.1 cm). Large hiatal hernia. Cholelithiasis. Aortic Atherosclerosis (ICD10-I70.0). Atherosclerotic coronary artery disease. Electronically Signed   By: Elberta Fortis M.D.   On: 09/26/2017 14:21    Procedures Procedures (including critical care time)  Medications Ordered in ED Medications  cefTRIAXone (ROCEPHIN) 1 g in sodium chloride 0.9 % 100 mL IVPB (0 g Intravenous Stopped 09/26/17 1202)  sodium chloride 0.9 % bolus 500 mL (0 mLs Intravenous Stopped 09/26/17 1340)  fentaNYL (SUBLIMAZE) injection 50 mcg (50 mcg Intravenous Given 09/26/17 1248)     Initial Impression / Assessment and Plan / ED Course  I have reviewed the triage vital signs and the nursing notes.  Pertinent labs & imaging results that were available during my care of the patient were reviewed by me and considered in my medical decision making (see chart for details).     PT comfortable on exam w/ reassuring VS. Focal tenderness to LLQ, no testicular swelling or pain. UA c/w infection, likely related to chronic urinary retention from BPH. Gave  CTX  and urine culture sent. Obtained CT abd/pelvis to r/o complication from previous hernia repair or infected stone.  Labs show Cr 1.47, previous is 1.3, WBC normal, stable anemia. CT shows no acute findings to explain the patient's symptoms.  He has had interval growth of his known aneurysm.  I discussed this finding with the patient's son and daughter-in-law who are aware of his aneurysm and they have decided to do no interventions for it.  He has been comfortable here, alert, no signs or symptoms of severe infection.  I suspect his pain may be related to UTI   Especially because he describes some groin pain.  Discussed treatment with Keflex and close follow-up with PCP for reassessment.  Extensively reviewed return precautions with family.  They voiced understanding. Final Clinical Impressions(s) / ED Diagnoses   Final diagnoses:  Urinary tract infection without hematuria, site unspecified    ED Discharge Orders        Ordered    cephALEXin (KEFLEX) 500 MG capsule  2 times daily     09/26/17 1511       Little, Ambrose Finland, MD 09/26/17 1528

## 2017-09-26 NOTE — ED Notes (Signed)
Bed: WA08 Expected date: 09/26/17 Expected time: 9:20 AM Means of arrival: Ambulance Comments: 84 year old with possible UTI.

## 2017-09-28 ENCOUNTER — Encounter: Payer: Self-pay | Admitting: Family

## 2017-09-28 ENCOUNTER — Non-Acute Institutional Stay: Payer: Medicare PPO | Admitting: Family

## 2017-09-28 DIAGNOSIS — N3000 Acute cystitis without hematuria: Secondary | ICD-10-CM

## 2017-09-28 LAB — URINE CULTURE: Culture: >=100000 — AB

## 2017-09-28 NOTE — Progress Notes (Signed)
Location:  [facility] Room Number: 32 Place of Service:  ALF [phone]) Provider: Javen Ridings FNP-C  Oneal Grout, MD  Patient Care Team: Oneal Grout, MD as PCP - General (Internal Medicine) Fia Hebert, Donalee Citrin, NP as Nurse Practitioner (Family Medicine)  Extended Emergency Contact Information Primary Emergency Contact: [name], [place] Home Phone: [phone] Mobile Phone: [phone] Relation: Son Secondary Emergency Contact: [name] Mobile Phone: [phone] Relation: Grandson  Code Status:  DNR Goals of care: Advanced Directive information Advanced Directives 09/28/2017  Does Patient Have a Medical Advance Directive? Yes  Type of Estate agent of [place];Out of facility DNR (pink MOST or yellow form);Living will  Does patient want to make changes to medical advance directive? -  Copy of Healthcare Power of Attorney in Chart? Yes  Would patient like information on creating a medical advance directive? -  Pre-existing out of facility DNR order (yellow form or pink MOST form) Yellow form placed in chart (order not valid for inpatient use);Pink MOST form placed in chart (order not valid for inpatient use)     Chief Complaint  Patient presents with  . Acute Visit    ER Follow up    HPI:  Pt is a 84 y.o. male seen today at [facility] for an acute visit for follow up ER visit.He is seen in his room today.He was in the ER 09/26/2017 presented with intermittent lower abdominal pain with radiation to his groin area x 2 days.CT scan done was negative for acute findings.U/A and C/S showed > 100,000 colonies of E.coli.He was treated with cephalexin 500 mg capsule twice daily x 7 days.He states pain his lower abdominal has improved since starting on antibiotics.He denies any fever or chills. Facility charge Nurse reports no new concerns.    Past Medical  History:  Diagnosis Date  . AAA (abdominal aortic aneurysm) (HCC)   . Abnormality of gait 08/01/2010  . Adhesive capsulitis of shoulder 08/01/2010   "Frozen" right shoulder  . Anemia   . Anxiety state, unspecified 08/01/2010  . Arthritis   . Congestive heart failure, unspecified 08/01/2010  . Cough 08/07/2015  . Disorders of bursae and tendons in shoulder region, unspecified 08/01/2010  . Edema 08/01/2010  . Fall   . Fecal impaction (HCC)   . GERD (gastroesophageal reflux disease)   . Hiatal hernia   . Hypercalcemia   . Hyperlipidemia   . Hypertension   . Hypertrophy of prostate without urinary obstruction and other lower urinary tract symptoms (LUTS) 08/01/2010  . Hyponatremia   . Hypothyroidism   . Infectious colitis   . Insomnia, unspecified 08/01/2010  . Joint pain   . Leg pain    with walking  . Lumbago 12/24/2010  . Orthostatic hypotension 11/05/2010  . Other specified cardiac dysrhythmias(427.89) 12/24/2010  . Pain in joint, pelvic region and thigh 08/06/2010  . Peptic ulcer, unspecified site, unspecified as acute or chronic, without mention of hemorrhage, perforation, or obstruction 08/01/2010  . Renal failure, acute (HCC)   . Swelling, mass, or lump in head and neck 01/03/2011  . Ulcer    Stomach  . Unspecified disorder of kidney and ureter 08/01/2010  . Unspecified hearing loss 08/01/2010  . Unspecified vitamin D deficiency 08/06/2010  . Urinary frequency 09/13/2010  . Vertigo    Past Surgical History:  Procedure Laterality Date  . ABDOMINAL AORTIC ANEURYSM REPAIR  12-18-10   Stent graft repair of AAA  . APPENDECTOMY    . CATARACT EXTRACTION    . HEMORRHOID SURGERY  2000  . INGUINAL HERNIA REPAIR Left 2005  . TRANSTHORACIC ECHOCARDIOGRAM  07/19/2010    Left ventricle: There is hypokinesis of the inferior wall and  posterior wall. The EF is 35-40%. The cavity size was normal    Allergies  Allergen Reactions  . Aleve [Naproxen] Other (See Comments)    Per [facility]  .  Indocin [Indomethacin] Other (See Comments)    [facility]    Outpatient Encounter Medications as of 09/28/2017  Medication Sig  . acetaminophen (TYLENOL) 500 MG tablet 1000 mg po bid and 500 mg q8h prn for pain  . alum & mag hydroxide-simeth (MINTOX) 200-200-20 MG/5ML suspension Take 30 mLs by mouth every 4 (four) hours as needed for indigestion or heartburn (for 48 hours and call MD if symptoms persist).   . benzocaine (ORAJEL) 10 % mucosal gel Use as directed 1 application in the mouth or throat as needed for mouth pain.  . bisacodyl (DULCOLAX) 10 MG suppository Place 10 mg rectally every other day. For constipation  . cephALEXin (KEFLEX) 500 MG capsule Take 1 capsule (500 mg total) by mouth 2 (two) times daily.  . Cholecalciferol (VITAMIN D3) 2000 units TABS Take 2,000 Units by mouth daily.   Marland Kitchen. DEXTRAN 70-HYPROMELLOSE, PF, OP Place 1 drop into both eyes as needed (for dry eyes).  . Dextromethorphan-Guaifenesin (TUSSIN COUGH DM PO) Take 5 mLs by mouth every 4 (four) hours as needed.  . finasteride (PROSCAR) 5 MG tablet Take 5 mg by mouth daily.   . fluocinonide (LIDEX) 0.05 % external solution Apply 1 application topically as needed.  . fluticasone (FLONASE) 50 MCG/ACT nasal spray Place 2 sprays into both nostrils daily.  Marland Kitchen. gabapentin (NEURONTIN) 100 MG capsule Take 100 mg by mouth daily with supper.   . hydrALAZINE (APRESOLINE) 25 MG tablet Take 25 mg by mouth 2 (two) times daily.  . hydrocortisone 1 % lotion Apply 1 application topically as needed for itching.  Marland Kitchen. ketoconazole (NIZORAL) 2 % shampoo Apply 1 application topically once a week.  . levothyroxine (SYNTHROID, LEVOTHROID) 50 MCG tablet Take 50 mcg by mouth daily before breakfast.   . Lidocaine (ASPERCREME LIDOCAINE) 4 % PTCH Apply 1 patch topically See admin instructions. 1 patch applied once a day to mid lower back  . losartan (COZAAR) 50 MG tablet Take 50 mg by mouth 2 (two) times daily. Hold for BP <110   . magic mouthwash  w/lidocaine SOLN Take 5-10 mLs by mouth every 4 (four) hours as needed for mouth pain.  . magnesium citrate SOLN Take by mouth as needed for severe constipation. Per ED physician resident may take 10 oz by mouth followed by 8 oz of water as needed every 4th day if no bowel movement in 3 days  . Misc. Devices (SLEEPRIGHT BREATHE AID) MISC 1 strip at bedtime.  . Nutritional Supplements (FEEDING SUPPLEMENT, BOOST BREEZE,) LIQD Take 1 Can by mouth 2 (two) times daily between meals.  Marland Kitchen. omeprazole (PRILOSEC OTC) 20 MG tablet Take 20 mg by mouth daily.  Marland Kitchen. oxyCODONE (OXYCONTIN) 10 mg 12 hr tablet Take 1 tablet (10 mg total) by mouth daily.  . polyethylene glycol (MIRALAX / GLYCOLAX) packet Take 17 g by mouth 2 (two) times daily.   . sennosides-docusate sodium (SENOKOT-S) 8.6-50 MG tablet Take 2 tablets by mouth at bedtime.  . sodium chloride (OCEAN) 0.65 %  SOLN nasal spray Place 1 spray into both nostrils 3 (three) times daily as needed for congestion.  . triamcinolone (KENALOG) 0.1 % paste Use as directed 1 application in the mouth or throat every 4 (four) hours as needed.   No facility-administered encounter medications on file as of 09/28/2017.     Review of Systems  Unable to perform ROS: Other (additional information provided by charge Nurse)  Constitutional: Negative for appetite change, chills, fatigue and fever.  Respiratory: Negative for cough, chest tightness, shortness of breath and wheezing.   Cardiovascular: Negative for chest pain, palpitations and leg swelling.  Gastrointestinal: Negative for abdominal distention, constipation, diarrhea, nausea and vomiting.       Lower abdominal pain has improved  Genitourinary: Negative for dysuria, flank pain and urgency.  Musculoskeletal: Positive for arthralgias, back pain and gait problem.  Skin: Negative for color change, pallor and rash.  Psychiatric/Behavioral: Negative for agitation, confusion and sleep disturbance. The patient is not  nervous/anxious.     Immunization History  Administered Date(s) Administered  . Influenza-Unspecified 12/27/2010, 12/29/2011, 12/27/2012, 12/13/2013, 12/14/2014, 12/20/2015, 12/17/2016  . PPD Test 07/30/2010, 08/14/2010  . Pneumococcal Conjugate-13 11/04/2016  . Pneumococcal Polysaccharide-23 03/10/2005  . Td 03/11/1999  . Tdap 10/31/2016   Pertinent  Health Maintenance Due  Topic Date Due  . INFLUENZA VACCINE  10/08/2017  . PNA vac Low Risk Adult  Completed   Fall Risk  10/29/2016 08/07/2015 08/07/2015 04/10/2015 03/27/2015  Falls in the past year? Yes Yes No No No  Comment - 06/02/15- skin tear right arm - - -  Number falls in past yr: 1 - - - -  Injury with Fall? No - - - -   Functional Status Survey:    Vitals:   09/28/17 1051  BP: 140/63  Pulse: 73  Resp: 20  Temp: 98.2 F (36.8 C)  SpO2: 97%  Weight: 143 lb (64.9 kg)  Height: 5\' 9"  (1.753 m)   Body mass index is 21.12 kg/m. Physical Exam  Constitutional: He is oriented to person, place, and time.  Frail elderly in no acute distress   HENT:  Head: Normocephalic.  Mouth/Throat: Oropharynx is clear and moist. No oropharyngeal exudate.  Eyes: Pupils are equal, round, and reactive to light. EOM are normal. Right eye exhibits no discharge. Left eye exhibits no discharge. No scleral icterus.  Neck: Normal range of motion.  Cardiovascular: Intact distal pulses. Exam reveals no gallop and no friction rub.  Murmur heard. Pulmonary/Chest: Effort normal and breath sounds normal. No stridor. No respiratory distress. He has no wheezes. He has no rales.  Abdominal: Soft. Bowel sounds are normal. He exhibits no distension and no mass. There is no tenderness. There is no rebound and no guarding.  Lymphadenopathy:    He has no cervical adenopathy.  Neurological: He is oriented to person, place, and time. Gait abnormal.  Skin: Skin is warm and dry. No rash noted. No erythema. No pallor.  Psychiatric: He has a normal mood and  affect. His speech is normal and behavior is normal. Judgment and thought content normal.  Nursing note and vitals reviewed.   Labs reviewed: Recent Labs    04/24/17 0649 04/25/17 0410 05/06/17 2226 06/04/17 09/21/17 09/26/17 1018  NA 136 135 136 140  140 135* 139  K 4.7 4.1 4.8 4.7  4.7 5.0 5.0  CL 103 103 105 108 106 111  CO2 24 24 22 24 20 22   GLUCOSE 106* 95 93  --   --  114*  BUN 23* 20 27* 33* 34* 54*  CREATININE 1.13 1.10 1.08 1.1  1.11 1.3 1.47*  CALCIUM 8.7* 8.4* 8.5* 9.3  9.3 8.6 8.5*  MG 2.2  --   --   --   --   --   PHOS 3.5  --   --   --   --   --    Recent Labs    04/23/17 1231 05/06/17 2226 09/21/17 09/26/17 1018  AST 23 19 16 19   ALT 11* 13* 9* 11  ALKPHOS 105 96 110 104  BILITOT 1.1 0.6  --  0.4  PROT 7.0 6.3* 6.0 6.3*  ALBUMIN 3.8 3.4* 3.7 3.5   Recent Labs    04/25/17 0410 05/06/17 2226 06/18/17 09/21/17 09/26/17 1018  WBC 6.7 5.8 4.7 9.1 6.9  NEUTROABS  --  3.0  --  6,552 4.4  HGB 10.6* 9.6* 9.7 9.3* 9.4*  HCT 32.1* 29.3* 29.3 27* 29.2*  MCV 89.4 88.8  --   --  93.9  PLT 154 208  --  161 167   Lab Results  Component Value Date   TSH 2.84 09/21/2017   Lab Results  Component Value Date   HGBA1C 5.6 04/06/2017   Lab Results  Component Value Date   CHOL 145 04/06/2017   HDL 47 04/06/2017   LDLCALC 89 04/06/2017   TRIG 47 04/06/2017   CHOLHDL 3.1 04/06/2017    Significant Diagnostic Results in last 30 days:  Ct Abdomen Pelvis Wo Contrast  Result Date: 09/26/2017 CLINICAL DATA:  Left lower quadrant pain radiating to groin 2 days. EXAM: CT ABDOMEN AND PELVIS WITHOUT CONTRAST TECHNIQUE: Multidetector CT imaging of the abdomen and pelvis was performed following the standard protocol without IV contrast. COMPARISON:  05/06/2017 and 04/23/2017 FINDINGS: Lower chest: Mild interstitial changes over the periphery of the lung bases. Small granuloma over the right middle lobe. Large hiatal hernia. Calcified plaque over the coronary arteries  and thoracic aorta. Mild cardiomegaly. Hepatobiliary: Cholelithiasis. Mild heterogeneity of the liver without definite focal mass. Biliary tree is within normal. Pancreas: Normal. Spleen: Normal. Adrenals/Urinary Tract: Adrenal glands are normal. Kidneys are within normal in size without hydronephrosis or nephrolithiasis. Ureters and bladder are normal. Stomach/Bowel: Large hiatal hernia. Small bowel is within normal. Previous appendectomy. Mild fecal retention throughout the colon. Minimal fluid over the presacral region in the pelvis. Vascular/Lymphatic: Moderate calcified plaque of the abdominal aorta. Evidence of patient's known infrarenal aortoiliac bifurcated stent graft which is unchanged. Patient's native aneurysm sac measures 9.3 cm (previously 8.8 cm). Known endoleak not well evaluated on this noncontrast study, although the area of the endoleak measures approximately 6.2 cm (previously approximately 5.1 cm). No adenopathy. Reproductive: Normal. Other: Small umbilical hernia containing only peritoneal fat. Musculoskeletal: Degenerative change of the spine and hips. IMPRESSION: No acute findings to explain patient's left lower quadrant/left groin pain. Evidence of patient's bifurcated aortoiliac stent graft unchanged. Interval enlargement of native aneurysm sac measuring 9.3 cm (previously 8.8 cm). Known endoleak not well evaluated on this noncontrast study but does appear to be larger measuring 6.2 cm (previously 5.1 cm). Large hiatal hernia. Cholelithiasis. Aortic Atherosclerosis (ICD10-I70.0). Atherosclerotic coronary artery disease. Electronically Signed   By: Elberta Fortis M.D.   On: 09/26/2017 14:21    Assessment/Plan Acute cystitis without hematuria Afebrile.Lower abdominal pain/tenderness has improved with start of cephalexin.continue on Cephalexin 500 mg capsule twice daily.Add Florastor 250 mg capsule one by mouth twice daily x 10 days for antibiotics associated diarrhea prevention.encourage  fluid intake.continue to monitor.   Family/ staff Communication: Reviewed plan of care with patient and facility charge Nurse.  Labs/tests ordered: None   Lucetta Baehr C Nomi Rudnicki, NP

## 2017-09-29 ENCOUNTER — Telehealth: Payer: Self-pay | Admitting: Emergency Medicine

## 2017-09-29 NOTE — Telephone Encounter (Signed)
Post ED Visit - Positive Culture Follow-up  Culture report reviewed by antimicrobial stewardship pharmacist:  []  Enzo BiNathan Batchelder, Pharm.D. []  Celedonio MiyamotoJeremy Frens, Pharm.D., BCPS AQ-ID []  Garvin FilaMike Maccia, Pharm.D., BCPS []  Georgina PillionElizabeth Martin, Pharm.D., BCPS []  KingstreeMinh Pham, [address] Rainbow BoulevardPharm.D., BCPS, AAHIVP []  Estella HuskMichelle Turner, Pharm.D., BCPS, AAHIVP [x]  Lysle Pearlachel Rumbarger, PharmD, BCPS []  Phillips Climeshuy Dang, PharmD, BCPS []  Agapito GamesAlison Masters, PharmD, BCPS []  Verlan FriendsErin Deja, PharmD  Positive urine culture Treated with cephalexin, organism sensitive to the same and no further patient follow-up is required at this time.  Berle MullMiller, Nevelyn Mellott 09/29/2017, 11:36 AM

## 2017-10-12 DIAGNOSIS — Z0189 Encounter for other specified special examinations: Secondary | ICD-10-CM | POA: Diagnosis not present

## 2017-10-21 ENCOUNTER — Encounter: Payer: Self-pay | Admitting: Internal Medicine

## 2017-11-04 ENCOUNTER — Non-Acute Institutional Stay: Payer: Medicare PPO

## 2017-11-04 DIAGNOSIS — Z0000 Encounter for general adult medical examination without abnormal findings: Secondary | ICD-10-CM | POA: Diagnosis not present

## 2017-11-04 NOTE — Progress Notes (Signed)
Subjective:   Ricardo [name] is a 84 y.o. male who presents for Medicare Annual/Subsequent preventive examination at [facility]  Last AWV-10/29/2016    Objective:    Vitals: BP 138/75 (BP Location: Left Arm, Patient Position: Sitting)   Pulse 75   Temp 98.1 F (36.7 C) (Oral)   Ht 5\' 9"  (1.753 m)   Wt 143 lb (64.9 kg)   BMI 21.12 kg/m   Body mass index is 21.12 kg/m.  Advanced Directives 11/04/2017 09/28/2017 09/26/2017 09/16/2017 06/29/2017 06/15/2017 05/29/2017  Does Patient Have a Medical Advance Directive? Yes Yes Yes Yes Yes Yes Yes  Type of Estate agent of [place];Out of facility DNR (pink MOST or yellow form);Living will Healthcare Power of [place];Out of facility DNR (pink MOST or yellow form);Living will Out of facility DNR (pink MOST or yellow form) Healthcare Power of [place];Out of facility DNR (pink MOST or yellow form);Living will Healthcare Power of [place];Out of facility DNR (pink MOST or yellow form);Living will Healthcare Power of [place];Out of facility DNR (pink MOST or yellow form);Living will Healthcare Power of [place];Living will;Out of facility DNR (pink MOST or yellow form)  Does patient want to make changes to medical advance directive? No - Patient declined - No - Patient declined - - No - Patient declined No - Patient declined  Copy of Healthcare Power of Attorney in Chart? Yes Yes Yes Yes Yes Yes Yes  Would patient like information on creating a medical advance directive? - - - - - - -  Pre-existing out of facility DNR order (yellow form or pink MOST form) Yellow form placed in chart (order not valid for inpatient use);Pink MOST form placed in chart (order not valid for inpatient use) Yellow form placed in chart (order not valid for inpatient use);Pink MOST form placed in chart (order not valid for inpatient use) - Yellow form placed in chart (order not valid for inpatient use);Pink MOST form placed in chart  (order not valid for inpatient use) Yellow form placed in chart (order not valid for inpatient use);Pink MOST form placed in chart (order not valid for inpatient use) Yellow form placed in chart (order not valid for inpatient use);Pink MOST form placed in chart (order not valid for inpatient use) Yellow form placed in chart (order not valid for inpatient use);Pink MOST form placed in chart (order not valid for inpatient use)    Tobacco Social History   Tobacco Use  Smoking Status Never Smoker  Smokeless Tobacco Never Used     Counseling given: Not Answered   Clinical Intake:  Pre-visit preparation completed: No  Pain : 0-10 Pain Score: 6  Pain Type: Chronic pain Pain Location: Back Pain Orientation: Lower Pain Descriptors / Indicators: Aching Pain Onset: More than a month ago Pain Frequency: Intermittent     Nutritional Risks: None Diabetes: No  How often do you need to have someone help you when you read instructions, pamphlets, or other written materials from your doctor or pharmacy?: 3 - Sometimes  Interpreter Needed?: No  Information entered by :: Tyron Russell, RN  Past Medical History:  Diagnosis Date  . AAA (abdominal aortic aneurysm) (HCC)   . Abnormality of gait 08/01/2010  . Adhesive capsulitis of shoulder 08/01/2010   "Frozen" right shoulder  . Anemia   . Anxiety state, unspecified 08/01/2010  . Arthritis   . Congestive heart failure, unspecified 08/01/2010  . Cough 08/07/2015  . Disorders of bursae and tendons in shoulder region, unspecified  08/01/2010  . Edema 08/01/2010  . Fall   . Fecal impaction (HCC)   . GERD (gastroesophageal reflux disease)   . Hiatal hernia   . Hypercalcemia   . Hyperlipidemia   . Hypertension   . Hypertrophy of prostate without urinary obstruction and other lower urinary tract symptoms (LUTS) 08/01/2010  . Hyponatremia   . Hypothyroidism   . Infectious colitis   . Insomnia, unspecified 08/01/2010  . Joint pain   . Leg pain      with walking  . Lumbago 12/24/2010  . Orthostatic hypotension 11/05/2010  . Other specified cardiac dysrhythmias(427.89) 12/24/2010  . Pain in joint, pelvic region and thigh 08/06/2010  . Peptic ulcer, unspecified site, unspecified as acute or chronic, without mention of hemorrhage, perforation, or obstruction 08/01/2010  . Renal failure, acute (HCC)   . Swelling, mass, or lump in head and neck 01/03/2011  . Ulcer    Stomach  . Unspecified disorder of kidney and ureter 08/01/2010  . Unspecified hearing loss 08/01/2010  . Unspecified vitamin D deficiency 08/06/2010  . Urinary frequency 09/13/2010  . Vertigo    Past Surgical History:  Procedure Laterality Date  . ABDOMINAL AORTIC ANEURYSM REPAIR  12-18-10   Stent graft repair of AAA  . APPENDECTOMY    . CATARACT EXTRACTION    . HEMORRHOID SURGERY  2000  . INGUINAL HERNIA REPAIR Left 2005  . TRANSTHORACIC ECHOCARDIOGRAM  07/19/2010    Left ventricle: There is hypokinesis of the inferior wall and  posterior wall. The EF is 35-40%. The cavity size was normal   Family History  Problem Relation Age of Onset  . Heart failure Brother    Social History   Socioeconomic History  . Marital status: Widowed    Spouse name: Not on file  . Number of children: Not on file  . Years of education: Not on file  . Highest education level: Not on file  Occupational History  . 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 Financial resource strain: Not hard at all  . Food insecurity:    Worry: Never true    Inability: Never true  . Transportation needs:    Medical: No    Non-medical: No  Tobacco Use  . Smoking status: Never Smoker  . Smokeless tobacco: Never Used  Substance and Sexual Activity  . Alcohol use: Yes    Alcohol/week: 0.0 standard drinks    Comment: 1 1/2 oz daily   . Drug use: No  . Sexual activity: Never  Lifestyle  . Physical activity:    Days per week: 0 days    Minutes per session: 0 min  . Stress: Not at  all  Relationships  . Social connections:    Talks on phone: More than three times a week    Gets together: Twice a week    Attends religious service: Never    Active member of club or organization: No    Attends meetings of clubs or organizations: Never    Relationship status: Widowed  Other Topics Concern  . Not on file  Social History Narrative   [facility] 2009 admitted to [place] 09/06/10   Living Will, MOST, POA   Widowed   Exercises: no   Power wheelchair   Alcohol 1 1/2 oz daily   Never smoked             Outpatient Encounter Medications as of 11/04/2017  Medication Sig  .  acetaminophen (TYLENOL) 500 MG tablet 1000 mg po bid and 500 mg q8h prn for pain  . alum & mag hydroxide-simeth (MINTOX) 200-200-20 MG/5ML suspension Take 30 mLs by mouth every 4 (four) hours as needed for indigestion or heartburn (for 48 hours and call MD if symptoms persist).   . benzocaine (ORAJEL) 10 % mucosal gel Use as directed 1 application in the mouth or throat as needed for mouth pain.  . bisacodyl (DULCOLAX) 10 MG suppository Place 10 mg rectally every other day. For constipation  . cephALEXin (KEFLEX) 500 MG capsule Take 1 capsule (500 mg total) by mouth 2 (two) times daily.  . Cholecalciferol (VITAMIN D3) 2000 units TABS Take 2,000 Units by mouth daily.   Marland Kitchen. DEXTRAN 70-HYPROMELLOSE, PF, OP Place 1 drop into both eyes as needed (for dry eyes).  . Dextromethorphan-Guaifenesin (TUSSIN COUGH DM PO) Take 5 mLs by mouth every 4 (four) hours as needed.  . finasteride (PROSCAR) 5 MG tablet Take 5 mg by mouth daily.   . fluocinonide (LIDEX) 0.05 % external solution Apply 1 application topically as needed.  . fluticasone (FLONASE) 50 MCG/ACT nasal spray Place 2 sprays into both nostrils daily.  Marland Kitchen. gabapentin (NEURONTIN) 100 MG capsule Take 100 mg by mouth daily with supper.   . hydrALAZINE (APRESOLINE) 25 MG tablet Take 25 mg by mouth 2 (two) times daily.  . hydrocortisone 1 %  lotion Apply 1 application topically as needed for itching.  Marland Kitchen. ketoconazole (NIZORAL) 2 % shampoo Apply 1 application topically once a week.  . levothyroxine (SYNTHROID, LEVOTHROID) 50 MCG tablet Take 50 mcg by mouth daily before breakfast.   . Lidocaine (ASPERCREME LIDOCAINE) 4 % PTCH Apply 1 patch topically See admin instructions. 1 patch applied once a day to mid lower back  . losartan (COZAAR) 50 MG tablet Take 50 mg by mouth 2 (two) times daily. Hold for BP <110   . magic mouthwash w/lidocaine SOLN Take 5-10 mLs by mouth every 4 (four) hours as needed for mouth pain.  . magnesium citrate SOLN Take by mouth as needed for severe constipation. Per ED physician resident may take 10 oz by mouth followed by 8 oz of water as needed every 4th day if no bowel movement in 3 days  . Misc. Devices (SLEEPRIGHT BREATHE AID) MISC 1 strip at bedtime.  . Nutritional Supplements (FEEDING SUPPLEMENT, BOOST BREEZE,) LIQD Take 1 Can by mouth 2 (two) times daily between meals.  Marland Kitchen. omeprazole (PRILOSEC OTC) 20 MG tablet Take 20 mg by mouth daily.  Marland Kitchen. oxyCODONE (OXYCONTIN) 10 mg 12 hr tablet Take 1 tablet (10 mg total) by mouth daily.  . polyethylene glycol (MIRALAX / GLYCOLAX) packet Take 17 g by mouth 2 (two) times daily.   . sennosides-docusate sodium (SENOKOT-S) 8.6-50 MG tablet Take 2 tablets by mouth at bedtime.  . sodium chloride (OCEAN) 0.65 % SOLN nasal spray Place 1 spray into both nostrils 3 (three) times daily as needed for congestion.  . triamcinolone (KENALOG) 0.1 % paste Use as directed 1 application in the mouth or throat every 4 (four) hours as needed.   No facility-administered encounter medications on file as of 11/04/2017.     Activities of Daily Living In your present state of health, do you have any difficulty performing the following activities: 11/04/2017 04/06/2017  Hearing? Malvin JohnsY Y  Vision? N N  Difficulty concentrating or making decisions? N N  Walking or climbing stairs? Y Y  Comment -  "can't walk distances; doesn't do stairs"  Dressing or bathing? Y Y  Doing errands, shopping? Malvin Johns  Preparing Food and eating ? Y -  Using the Toilet? Y -  In the past six months, have you accidently leaked urine? Y -  Do you have problems with loss of bowel control? N -  Managing your Medications? Y -  Managing your Finances? Y -  Housekeeping or managing your Housekeeping? Y -  Some recent data might be hidden    Patient Care Team: Oneal Grout, MD as PCP - General (Internal Medicine) Ngetich, Donalee Citrin, NP as Nurse Practitioner (Family Medicine)   Assessment:   This is a routine wellness examination for Ricardo [name].  Exercise Activities and Dietary recommendations Current Exercise Habits: The patient does not participate in regular exercise at present, Exercise limited by: orthopedic condition(s)  Goals   None     Fall Risk Fall Risk  11/04/2017 10/29/2016 08/07/2015 08/07/2015 04/10/2015  Falls in the past year? No Yes Yes No No  Comment - - 06/02/15- skin tear right arm - -  Number falls in past yr: - 1 - - -  Injury with Fall? - No - - -   Is the patient's home free of loose throw rugs in walkways, pet beds, electrical cords, etc?   yes      Grab bars in the bathroom? yes      Handrails on the stairs?   yes      Adequate lighting?   yes  Depression Screen PHQ 2/9 Scores 11/04/2017 10/29/2016  PHQ - 2 Score 0 0    Cognitive Function MMSE - Mini Mental State Exam 11/04/2017 10/29/2016  Not completed: Unable to complete Unable to complete        Immunization History  Administered Date(s) Administered  . Influenza-Unspecified 12/27/2010, 12/29/2011, 12/27/2012, 12/13/2013, 12/14/2014, 12/20/2015, 12/17/2016  . PPD Test 07/30/2010, 08/14/2010  . Pneumococcal Conjugate-13 11/04/2016  . Pneumococcal Polysaccharide-23 03/10/2005  . Td 03/11/1999  . Tdap 10/31/2016    Qualifies for Shingles Vaccine? Not in past records  Screening Tests Health Maintenance  Topic Date Due   . INFLUENZA VACCINE  10/08/2017  . TETANUS/TDAP  11/01/2026  . PNA vac Low Risk Adult  Completed   Cancer Screenings: Lung: Low Dose CT Chest recommended if Age 45-80 years, 30 pack-year currently smoking OR have quit w/in 15years. Patient does not qualify. Colorectal: up to date  Additional Screenings:  Hepatitis C Screening:declined  Flu vaccine due: will get at [facility]    Plan:    I have personally reviewed and addressed the Medicare Annual Wellness questionnaire and have noted the following in the patient's chart:  A. Medical and social history B. Use of alcohol, tobacco or illicit drugs  C. Current medications and supplements D. Functional ability and status E.  Nutritional status F.  Physical activity G. Advance directives H. List of other physicians I.  Hospitalizations, surgeries, and ER visits in previous 12 months J.  Vitals K. Screenings to include hearing, vision, cognitive, depression L. Referrals and appointments - none  In addition, I have reviewed and discussed with patient certain preventive protocols, quality metrics, and best practice recommendations. A written personalized care plan for preventive services as well as general preventive health recommendations were provided to patient.  See attached scanned questionnaire for additional information.   Signed,   Tyron Russell, RN Nurse Health Advisor  Patient Concerns: none

## 2017-11-04 NOTE — Patient Instructions (Signed)
[name] , Thank you for taking time to come for your Medicare Wellness Visit. I appreciate your ongoing commitment to your health goals. Please review the following plan we discussed and let me know if I can assist you in the future.   Screening recommendations/referrals: Colonoscopy excluded, over age 82 Recommended yearly ophthalmology/optometry visit for glaucoma screening and checkup Recommended yearly dental visit for hygiene and checkup  Vaccinations: [facility] Pneumococcal vaccine up to date, completed Tdap vaccine up to date, due 11/01/2026 Shingles vaccine not in past records    Advanced directives: in chart  Conditions/risks identified: none  Next appointment: Dr. Glade LloydPandey makes rounds  Preventive Care 65 Years and Older, Male Preventive care refers to lifestyle choices and visits with your health care provider that can promote health and wellness. What does preventive care include?  A yearly physical exam. This is also called an annual well check.  Dental exams once or twice a year.  Routine eye exams. Ask your health care provider how often you should have your eyes checked.  Personal lifestyle choices, including:  Daily care of your teeth and gums.  Regular physical activity.  Eating a healthy diet.  Avoiding tobacco and drug use.  Limiting alcohol use.  Practicing safe sex.  Taking low doses of aspirin every day.  Taking vitamin and mineral supplements as recommended by your health care provider. What happens during an annual well check? The services and screenings done by your health care provider during your annual well check will depend on your age, overall health, lifestyle risk factors, and family history of disease. Counseling  Your health care provider may ask you questions about your:  Alcohol use.  Tobacco use.  Drug use.  Emotional well-being.  Home and relationship well-being.  Sexual  activity.  Eating habits.  History of falls.  Memory and ability to understand (cognition).  Work and work Astronome[employer]vironment. Screening  You may have the following tests or measurements:  Height, weight, and BMI.  Blood pressure.  Lipid and cholesterol levels. These may be checked every 5 years, or more frequently if you are over 82 years old.  Skin check.  Lung cancer screening. You may have this screening every year starting at age 82 if you have a 30-pack-year history of smoking and currently smoke or have quit within the past 15 years.  Fecal occult blood test (FOBT) of the stool. You may have this test every year starting at age 850.  Flexible sigmoidoscopy or colonoscopy. You may have a sigmoidoscopy every 5 years or a colonoscopy every 10 years starting at age 82.  Prostate cancer screening. Recommendations will vary depending on your family history and other risks.  Hepatitis C blood test.  Hepatitis B blood test.  Sexually transmitted disease (STD) testing.  Diabetes screening. This is done by checking your blood sugar (glucose) after you have not eaten for a while (fasting). You may have this done every 1-3 years.  Abdominal aortic aneurysm (AAA) screening. You may need this if you are a current or former smoker.  Osteoporosis. You may be screened starting at age 82 if you are at high risk. Talk with your health care provider about your test results, treatment options, and if necessary, the need for more tests. Vaccines  Your health care provider may recommend certain vaccines, such as:  Influenza vaccine. This is recommended every year.  Tetanus, diphtheria, and acellular pertussis (Tdap, Td) vaccine. You may need a Td booster  every 10 years.  Zoster vaccine. You may need this after age 22.  Pneumococcal 13-valent conjugate (PCV13) vaccine. One dose is recommended after age 14.  Pneumococcal polysaccharide (PPSV23) vaccine. One dose is recommended after age  82. Talk to your health care provider about which screenings and vaccines you need and how often you need them. This information is not intended to replace advice given to you by your health care provider. Make sure you discuss any questions you have with your health care provider. Document Released: 03/23/2015 Document Revised: 11/14/2015 Document Reviewed: 12/26/2014 Elsevier Interactive Patient Education  2017 [place] Prevention in the Home Falls can cause injuries. They can happen to people of all ages. There are many things you can do to make your home safe and to help prevent falls. What can I do on the outside of my home?  Regularly fix the edges of walkways and driveways and fix any cracks.  Remove anything that might make you trip as you walk through a door, such as a raised step or threshold.  Trim any bushes or trees on the path to your home.  Use bright outdoor lighting.  Clear any walking paths of anything that might make someone trip, such as rocks or tools.  Regularly check to see if handrails are loose or broken. Make sure that both sides of any steps have handrails.  Any raised decks and porches should have guardrails on the edges.  Have any leaves, snow, or ice cleared regularly.  Use sand or salt on walking paths during winter.  Clean up any spills in your garage right away. This includes oil or grease spills. What can I do in the bathroom?  Use night lights.  Install grab bars by the toilet and in the tub and shower. Do not use towel bars as grab bars.  Use non-skid mats or decals in the tub or shower.  If you need to sit down in the shower, use a plastic, non-slip stool.  Keep the floor dry. Clean up any water that spills on the floor as soon as it happens.  Remove soap buildup in the tub or shower regularly.  Attach bath mats securely with double-sided non-slip rug tape.  Do not have throw rugs and other things on the floor that can make  you trip. What can I do in the bedroom?  Use night lights.  Make sure that you have a light by your bed that is easy to reach.  Do not use any sheets or blankets that are too big for your bed. They should not hang down onto the floor.  Have a firm chair that has side arms. You can use this for support while you get dressed.  Do not have throw rugs and other things on the floor that can make you trip. What can I do in the kitchen?  Clean up any spills right away.  Avoid walking on wet floors.  Keep items that you use a lot in easy-to-reach places.  If you need to reach something above you, use a strong step stool that has a grab bar.  Keep electrical cords out of the way.  Do not use floor polish or wax that makes floors slippery. If you must use wax, use non-skid floor wax.  Do not have throw rugs and other things on the floor that can make you trip. What can I do with my stairs?  Do not leave any items on the stairs.  Make  sure that there are handrails on both sides of the stairs and use them. Fix handrails that are broken or loose. Make sure that handrails are as long as the stairways.  Check any carpeting to make sure that it is firmly attached to the stairs. Fix any carpet that is loose or worn.  Avoid having throw rugs at the top or bottom of the stairs. If you do have throw rugs, attach them to the floor with carpet tape.  Make sure that you have a light switch at the top of the stairs and the bottom of the stairs. If you do not have them, ask someone to add them for you. What else can I do to help prevent falls?  Wear shoes that:  Do not have high heels.  Have rubber bottoms.  Are comfortable and fit you well.  Are closed at the toe. Do not wear sandals.  If you use a stepladder:  Make sure that it is fully opened. Do not climb a closed stepladder.  Make sure that both sides of the stepladder are locked into place.  Ask someone to hold it for you, if  possible.  Clearly mark and make sure that you can see:  Any grab bars or handrails.  First and last steps.  Where the edge of each step is.  Use tools that help you move around (mobility aids) if they are needed. These include:  Canes.  Walkers.  Scooters.  Crutches.  Turn on the lights when you go into a dark area. Replace any light bulbs as soon as they burn out.  Set up your furniture so you have a clear path. Avoid moving your furniture around.  If any of your floors are uneven, fix them.  If there are any pets around you, be aware of where they are.  Review your medicines with your doctor. Some medicines can make you feel dizzy. This can increase your chance of falling. Ask your doctor what other things that you can do to help prevent falls. This information is not intended to replace advice given to you by your health care provider. Make sure you discuss any questions you have with your health care provider. Document Released: 12/21/2008 Document Revised: 08/02/2015 Document Reviewed: 03/31/2014 Elsevier Interactive Patient Education  2017 [employer].

## 2017-11-05 ENCOUNTER — Other Ambulatory Visit: Payer: Self-pay

## 2017-11-05 MED ORDER — OXYCODONE HCL ER 10 MG PO T12A: 10.0000 mg | EXTENDED_RELEASE_TABLET | Freq: Every day | ORAL | 0 refills | Status: DC

## 2017-12-30 ENCOUNTER — Non-Acute Institutional Stay: Payer: Medicare PPO | Admitting: Family Medicine

## 2017-12-30 ENCOUNTER — Encounter: Payer: Self-pay | Admitting: Family Medicine

## 2017-12-30 DIAGNOSIS — I10 Essential (primary) hypertension: Secondary | ICD-10-CM | POA: Diagnosis not present

## 2017-12-30 DIAGNOSIS — I714 Abdominal aortic aneurysm, without rupture, unspecified: Secondary | ICD-10-CM

## 2017-12-30 DIAGNOSIS — H9193 Unspecified hearing loss, bilateral: Secondary | ICD-10-CM | POA: Diagnosis not present

## 2017-12-30 DIAGNOSIS — N183 Chronic kidney disease, stage 3 unspecified: Secondary | ICD-10-CM

## 2017-12-30 DIAGNOSIS — R269 Unspecified abnormalities of gait and mobility: Secondary | ICD-10-CM

## 2017-12-30 NOTE — Progress Notes (Signed)
Provider:  Jacalyn Lefevre, MD Location:  [facility] Room Number: 32A Place of Service:  ALF (13)  PCP: Ngetich, Donalee Citrin, NP Patient Care Team: Ngetich, Donalee Citrin, NP as PCP - General (Family Medicine)  Extended Emergency Contact Information Primary Emergency Contact: [name], [place] Home Phone: [phone] Mobile Phone: [phone] Relation: Son Secondary Emergency Contact: [name] Mobile Phone: [phone] Relation: Grandson  Code Status:  Goals of Care: Advanced Directive information Advanced Directives 11/04/2017  Does Patient Have a Medical Advance Directive? Yes  Type of Estate agent of [place];Out of facility DNR (pink MOST or yellow form);Living will  Does patient want to make changes to medical advance directive? No - Patient declined  Copy of Healthcare Power of Attorney in Chart? Yes  Would patient like information on creating a medical advance directive? -  Pre-existing out of facility DNR order (yellow form or pink MOST form) Yellow form placed in chart (order not valid for inpatient use);Pink MOST form placed in chart (order not valid for inpatient use)      Chief Complaint  Patient presents with  . Medical Management of Chronic Issues    Routine Visit    HPI: Patient is a 84 y.o. male seen today for routine management of chronic problems including hypertension abdominal aortic aneurysm, benign prostatic hypertrophy, chronic kidney disease.  Patient is extremely hard of hearing if not deaf and asked me to write down questions.  Denies any symptoms or problems skin about this from his list he tells me that recent evaluation of aneurysm showed no change p significant difference in weight from last visit appetite remains good.  Er nursing notes, blood pressures have been well controlled. .  Past Medical History:  Diagnosis Date  . AAA  (abdominal aortic aneurysm) (HCC)   . Abnormality of gait 08/01/2010  . Adhesive capsulitis of shoulder 08/01/2010   "Frozen" right shoulder  . Anemia   . Anxiety state, unspecified 08/01/2010  . Arthritis   . Congestive heart failure, unspecified 08/01/2010  . Cough 08/07/2015  . Disorders of bursae and tendons in shoulder region, unspecified 08/01/2010  . Edema 08/01/2010  . Fall   . Fecal impaction (HCC)   . GERD (gastroesophageal reflux disease)   . Hiatal hernia   . Hypercalcemia   . Hyperlipidemia   . Hypertension   . Hypertrophy of prostate without urinary obstruction and other lower urinary tract symptoms (LUTS) 08/01/2010  . Hyponatremia   . Hypothyroidism   . Infectious colitis   . Insomnia, unspecified 08/01/2010  . Joint pain   . Leg pain    with walking  . Lumbago 12/24/2010  . Orthostatic hypotension 11/05/2010  . Other specified cardiac dysrhythmias(427.89) 12/24/2010  . Pain in joint, pelvic region and thigh 08/06/2010  . Peptic ulcer, unspecified site, unspecified as acute or chronic, without mention of hemorrhage, perforation, or obstruction 08/01/2010  . Renal failure, acute (HCC)   . Swelling, mass, or lump in head and neck 01/03/2011  . Ulcer    Stomach  . Unspecified disorder of kidney and ureter 08/01/2010  . Unspecified hearing loss 08/01/2010  . Unspecified vitamin D deficiency 08/06/2010  . Urinary frequency 09/13/2010  . Vertigo    Past Surgical History:  Procedure Laterality Date  . ABDOMINAL AORTIC ANEURYSM REPAIR  12-18-10   Stent graft repair of AAA  . APPENDECTOMY    .  CATARACT EXTRACTION    . HEMORRHOID SURGERY  2000  . INGUINAL HERNIA REPAIR Left 2005  . TRANSTHORACIC ECHOCARDIOGRAM  07/19/2010    Left ventricle: There is hypokinesis of the inferior wall and  posterior wall. The EF is 35-40%. The cavity size was normal    reports that he has never smoked. He has never used smokeless tobacco. He reports that he drinks alcohol. He reports that he does  not use drugs. Social History   Socioeconomic History  . Marital status: Widowed    Spouse name: Not on file  . Number of children: Not on file  . Years of education: Not on file  . Highest education level: Not on file  Occupational History  . 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 Financial resource strain: Not hard at all  . Food insecurity:    Worry: Never true    Inability: Never true  . Transportation needs:    Medical: No    Non-medical: No  Tobacco Use  . Smoking status: Never Smoker  . Smokeless tobacco: Never Used  Substance and Sexual Activity  . Alcohol use: Yes    Alcohol/week: 0.0 standard drinks    Comment: 1 1/2 oz daily   . Drug use: No  . Sexual activity: Never  Lifestyle  . Physical activity:    Days per week: 0 days    Minutes per session: 0 min  . Stress: Not at all  Relationships  . Social connections:    Talks on phone: More than three times a week    Gets together: Twice a week    Attends religious service: Never    Active member of club or organization: No    Attends meetings of clubs or organizations: Never    Relationship status: Widowed  . Intimate partner violence:    Fear of current or ex partner: No    Emotionally abused: No    Physically abused: No    Forced sexual activity: No  Other Topics Concern  . Not on file  Social History Narrative   [facility] since 2009 admitted to [facility] 09/06/10   Living Will, MOST, POA   Widowed   Exercises: no   Power wheelchair   Alcohol 1 1/2 oz daily   Never smoked             Functional Status Survey:    Family History  Problem Relation Age of Onset  . Heart failure Brother     Health Maintenance  Topic Date Due  . TETANUS/TDAP  11/01/2026  . INFLUENZA VACCINE  Completed  . PNA vac Low Risk Adult  Completed    Allergies  Allergen Reactions  . Aleve [Naproxen] Other (See Comments)    Per MAR  . Indocin [Indomethacin] Other (See Comments)     Per [facility]    Outpatient Encounter Medications as of 12/30/2017  Medication Sig  . benzocaine (ORAJEL) 10 % mucosal gel Use as directed 1 application in the mouth or throat as needed for mouth pain.  Marland Kitchen DEXTRAN 70-HYPROMELLOSE, PF, OP Place 1 drop into both eyes as needed (for dry eyes).  . fluocinonide (LIDEX) 0.05 % external solution Apply 1 application topically as needed. apply to scalp for itching  . fluticasone (FLONASE) 50 MCG/ACT nasal spray Place 2 sprays into both nostrils daily.  Marland Kitchen gabapentin (NEURONTIN) 100 MG capsule Take 100 mg by mouth daily with supper.   . hydrALAZINE (APRESOLINE)  25 MG tablet Take 25 mg by mouth 2 (two) times daily.  . hydrocortisone 1 % lotion Apply 1 application topically as needed for itching.  Marland Kitchen ketoconazole (NIZORAL) 2 % shampoo Apply 1 application topically once a week.  . levothyroxine (SYNTHROID, LEVOTHROID) 50 MCG tablet Take 50 mcg by mouth daily before breakfast.   . Lidocaine (ASPERCREME LIDOCAINE) 4 % PTCH Apply 1 patch topically See admin instructions. 1 patch applied once a day to mid lower back  . losartan (COZAAR) 50 MG tablet Take 50 mg by mouth 2 (two) times daily. Hold for BP <110   . magic mouthwash w/lidocaine SOLN Take 5-10 mLs by mouth every 4 (four) hours as needed for mouth pain.  . magnesium citrate SOLN Take by mouth as needed for severe constipation. Per ED physician resident may take 10 oz by mouth followed by 8 oz of water as needed every 4th day if no bowel movement in 3 days  . Misc. Devices (SLEEPRIGHT BREATHE AID) MISC 1 strip at bedtime.  Marland Kitchen acetaminophen (TYLENOL) 500 MG tablet 1000 mg po bid and 500 mg q8h prn for pain  . alum & mag hydroxide-simeth (MINTOX) 200-200-20 MG/5ML suspension Take 30 mLs by mouth every 4 (four) hours as needed for indigestion or heartburn (for 48 hours and call MD if symptoms persist).   . bisacodyl (DULCOLAX) 10 MG suppository Place 10 mg rectally every other day. For constipation  . cephALEXin  (KEFLEX) 500 MG capsule Take 1 capsule (500 mg total) by mouth 2 (two) times daily.  . Cholecalciferol (VITAMIN D3) 2000 units TABS Take 2,000 Units by mouth daily.   Marland Kitchen Dextromethorphan-Guaifenesin (TUSSIN COUGH DM PO) Take 5 mLs by mouth every 4 (four) hours as needed.  . finasteride (PROSCAR) 5 MG tablet Take 5 mg by mouth daily.   . Nutritional Supplements (FEEDING SUPPLEMENT, BOOST BREEZE,) LIQD Take 1 Can by mouth 2 (two) times daily between meals.  Marland Kitchen omeprazole (PRILOSEC OTC) 20 MG tablet Take 20 mg by mouth daily.  Marland Kitchen oxyCODONE (OXYCONTIN) 10 mg 12 hr tablet Take 1 tablet (10 mg total) by mouth daily.  . polyethylene glycol (MIRALAX / GLYCOLAX) packet Take 17 g by mouth 2 (two) times daily.   . sennosides-docusate sodium (SENOKOT-S) 8.6-50 MG tablet Take 2 tablets by mouth at bedtime.  . sodium chloride (OCEAN) 0.65 % SOLN nasal spray Place 1 spray into both nostrils 3 (three) times daily as needed for congestion.  . triamcinolone (KENALOG) 0.1 % paste Use as directed 1 application in the mouth or throat every 4 (four) hours as needed.   No facility-administered encounter medications on file as of 12/30/2017.     Review of Systems  Constitutional: Negative.   HENT: Positive for hearing loss.   Respiratory: Negative.   Cardiovascular: Negative.   Gastrointestinal: Negative.   Endocrine: Negative.   Genitourinary: Negative.   Musculoskeletal: Positive for gait problem.  Skin: Negative.   Hematological: Negative.   Psychiatric/Behavioral: Negative.     Vitals:   12/30/17 0953  BP: (!) 122/54  Pulse: 68  Resp: 18  Temp: (!) 97.3 F (36.3 C)  TempSrc: Oral  SpO2: 96%  Weight: 145 lb (65.8 kg)  Height: 5\' 9"  (1.753 m)   Body mass index is 21.41 kg/m. Physical Exam  Constitutional: He is oriented to person, place, and time. He appears well-developed and well-nourished.  HENT:  Head: Normocephalic.  Mouth/Throat: Oropharynx is clear and moist.  Eyes: Pupils are equal,  round, and reactive  to light. EOM are normal.  Cardiovascular: Normal rate, regular rhythm and normal heart sounds.  Pulmonary/Chest: Effort normal and breath sounds normal.  Musculoskeletal: Normal range of motion.  Neurological: He is alert and oriented to person, place, and time.  We discussed several pictures that adorn his walls.  There is an autograft picture of several baseball stores from [phone].  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  Nursing note and vitals reviewed.   Labs reviewed: Basic Metabolic Panel: Recent Labs    04/24/17 0649 04/25/17 0410 05/06/17 2226 06/04/17 09/21/17 09/26/17 1018  NA 136 135 136 140  140 135* 139  K 4.7 4.1 4.8 4.7  4.7 5.0 5.0  CL 103 103 105 108 106 111  CO2 24 24 22 24 20 22   GLUCOSE 106* 95 93  --   --  114*  BUN 23* 20 27* 33* 34* 54*  CREATININE 1.13 1.10 1.08 1.1  1.11 1.3 1.47*  CALCIUM 8.7* 8.4* 8.5* 9.3  9.3 8.6 8.5*  MG 2.2  --   --   --   --   --   PHOS 3.5  --   --   --   --   --    Liver Function Tests: Recent Labs    04/23/17 1231 05/06/17 2226 09/21/17 09/26/17 1018  AST 23 19 16 19   ALT 11* 13* 9* 11  ALKPHOS 105 96 110 104  BILITOT 1.1 0.6  --  0.4  PROT 7.0 6.3* 6.0 6.3*  ALBUMIN 3.8 3.4* 3.7 3.5   Recent Labs    05/06/17 2226 09/26/17 1018  LIPASE 24 29   No results for input(s): AMMONIA in the last 8760 hours. CBC: Recent Labs    04/25/17 0410 05/06/17 2226 06/18/17 09/21/17 09/26/17 1018  WBC 6.7 5.8 4.7 9.1 6.9  NEUTROABS  --  3.0  --  6,552 4.4  HGB 10.6* 9.6* 9.7 9.3* 9.4*  HCT 32.1* 29.3* 29.3 27* 29.2*  MCV 89.4 88.8  --   --  93.9  PLT 154 208  --  161 167   Cardiac Enzymes: Recent Labs    04/06/17 0821 04/06/17 1430 04/06/17 2017  TROPONINI 0.14* 0.10* 0.09*   BNP: Invalid input(s): POCBNP Lab Results  Component Value Date   HGBA1C 5.6 04/06/2017   Lab Results  Component Value Date   TSH 2.84 09/21/2017    Lab Results  Component Value Date   VITAMINB12 690 04/21/2010   Lab Results  Component Value Date   FOLATE  04/21/2010    14.6 (NOTE)  Reference Ranges        Deficient:       0.4 - 3.3 ng/mL        Indeterminate:   3.4 - 5.4 ng/mL        Normal:              > 5.4 ng/mL   Lab Results  Component Value Date   IRON 12 (L) 04/21/2010   TIBC 182 (L) 04/21/2010   FERRITIN 153 04/21/2010    Imaging and Procedures obtained prior to SNF admission: Ct Abdomen Pelvis Wo Contrast  Result Date: 09/26/2017 CLINICAL DATA:  Left lower quadrant pain radiating to groin 2 days. EXAM: CT ABDOMEN AND PELVIS WITHOUT CONTRAST TECHNIQUE: Multidetector CT imaging of the abdomen and pelvis was performed following the standard protocol without IV contrast. COMPARISON:  05/06/2017 and 04/23/2017 FINDINGS: Lower chest: Mild interstitial  changes over the periphery of the lung bases. Small granuloma over the right middle lobe. Large hiatal hernia. Calcified plaque over the coronary arteries and thoracic aorta. Mild cardiomegaly. Hepatobiliary: Cholelithiasis. Mild heterogeneity of the liver without definite focal mass. Biliary tree is within normal. Pancreas: Normal. Spleen: Normal. Adrenals/Urinary Tract: Adrenal glands are normal. Kidneys are within normal in size without hydronephrosis or nephrolithiasis. Ureters and bladder are normal. Stomach/Bowel: Large hiatal hernia. Small bowel is within normal. Previous appendectomy. Mild fecal retention throughout the colon. Minimal fluid over the presacral region in the pelvis. Vascular/Lymphatic: Moderate calcified plaque of the abdominal aorta. Evidence of patient's known infrarenal aortoiliac bifurcated stent graft which is unchanged. Patient's native aneurysm sac measures 9.3 cm (previously 8.8 cm). Known endoleak not well evaluated on this noncontrast study, although the area of the endoleak measures approximately 6.2 cm (previously approximately 5.1 cm). No adenopathy.  Reproductive: Normal. Other: Small umbilical hernia containing only peritoneal fat. Musculoskeletal: Degenerative change of the spine and hips. IMPRESSION: No acute findings to explain patient's left lower quadrant/left groin pain. Evidence of patient's bifurcated aortoiliac stent graft unchanged. Interval enlargement of native aneurysm sac measuring 9.3 cm (previously 8.8 cm). Known endoleak not well evaluated on this noncontrast study but does appear to be larger measuring 6.2 cm (previously 5.1 cm). Large hiatal hernia. Cholelithiasis. Aortic Atherosclerosis (ICD10-I70.0). Atherosclerotic coronary artery disease. Electronically Signed   By: Elberta Fortis M.D.   On: 09/26/2017 14:21    Assessment/Plan 1. Essential hypertension Blood pressures controlled 7 of losartan and hydralazine   2. Abdominal aortic aneurysm (AAA) without rupture (HCC) Last ultrasound was 3 months ago and showed no  3. Bilateral deafness As noted bladder wright down questions for patient  4. Chronic kidney disease, stage III (moderate) (HCC) Is from July show some decline in renal function encourage hydration  5. Abnormality of gait Patient has had history of falls but none recently.  Uses power wheelchair for ambulation   Family/ staff Communication:   Labs/tests ordered:  Bertram Millard. Hyacinth Meeker, MD [facility] [address] [place], [place] [address] Office [phone]

## 2018-01-27 ENCOUNTER — Encounter: Payer: Self-pay | Admitting: Family

## 2018-01-27 ENCOUNTER — Non-Acute Institutional Stay: Payer: Medicare PPO | Admitting: Family

## 2018-01-27 DIAGNOSIS — S2020XA Contusion of thorax, unspecified, initial encounter: Secondary | ICD-10-CM

## 2018-01-27 DIAGNOSIS — W19XXXA Unspecified fall, initial encounter: Secondary | ICD-10-CM | POA: Diagnosis not present

## 2018-01-27 DIAGNOSIS — S20412A Abrasion of left back wall of thorax, initial encounter: Secondary | ICD-10-CM

## 2018-01-27 DIAGNOSIS — S41112A Laceration without foreign body of left upper arm, initial encounter: Secondary | ICD-10-CM | POA: Diagnosis not present

## 2018-01-27 DIAGNOSIS — R2681 Unsteadiness on feet: Secondary | ICD-10-CM | POA: Diagnosis not present

## 2018-01-27 DIAGNOSIS — Y92129 Unspecified place in nursing home as the place of occurrence of the external cause: Secondary | ICD-10-CM

## 2018-01-27 NOTE — Progress Notes (Signed)
Location:  [facility] Room Number: 32A Place of Service:  ALF [phone]) Provider: Dinah Ngetich FNP-C  Ngetich, Donalee Citrin, NP  Patient Care Team: Ngetich, Donalee Citrin, NP as PCP - General (Family Medicine)  Extended Emergency Contact Information Primary Emergency Contact: [name],  [place] Home Phone: [phone] Mobile Phone: [phone] Relation: Son Secondary Emergency Contact: [name] [place] Mobile Phone: [phone] Relation: Grandson  Code Status:  DNR Goals of care: Advanced Directive information Advanced Directives 01/27/2018  Does Patient Have a Medical Advance Directive? Yes  Type of Advance Directive Out of facility DNR (pink MOST or yellow form);Healthcare Power of [place];Living will  Does patient want to make changes to medical advance directive? No - Patient declined  Copy of Healthcare Power of Attorney in Chart? Yes - validated most recent copy scanned in chart (See row information)  Would patient like information on creating a medical advance directive? -  Pre-existing out of facility DNR order (yellow form or pink MOST form) Yellow form placed in chart (order not valid for inpatient use);Pink MOST form placed in chart (order not valid for inpatient use)     Chief Complaint  Patient presents with  . Acute Visit    Fall    HPI:  Pt is a 84 y.o. male seen today at [facility] for an acute visit for evaluation of fall.He is seen in his room today with facility Nurse present at bedside.Nurse reported via SBAR received today of patient's fall on 01/22/2018 at 4 Am that patient called out from the bathroom upon arrival patient was noted by staff lying on the floor.He sustained skin tear to left arm and abrasion to upper back.He ws assisted up to his wheelchair.He denies hitting head.He complains of left arm soreness and chronic back pain.he requests Nurse to give him pain  medication during visit.He continues to transfer from the bed to power wheelchair and ambulates short distance with his front wheel walker.Nurse states he walks in his room at times holding onto bed /furniture without a walker.He denies any fever,chills or cough.He jokes about his advance age and falls.   Past Medical History:  Diagnosis Date  . AAA (abdominal aortic aneurysm) (HCC)   . Abnormality of gait 08/01/2010  . Adhesive capsulitis of shoulder 08/01/2010   "Frozen" right shoulder  . Anemia   . Anxiety state, unspecified 08/01/2010  . Arthritis   . Congestive heart failure, unspecified 08/01/2010  . Cough 08/07/2015  . Disorders of bursae and tendons in shoulder region, unspecified 08/01/2010  . Edema 08/01/2010  . Fall   . Fecal impaction (HCC)   . GERD (gastroesophageal reflux disease)   . Hiatal hernia   . Hypercalcemia   . Hyperlipidemia   . Hypertension   . Hypertrophy of prostate without urinary obstruction and other lower urinary tract symptoms (LUTS) 08/01/2010  . Hyponatremia   . Hypothyroidism   . Infectious colitis   . Insomnia, unspecified 08/01/2010  . Joint pain   . Leg pain    with walking  . Lumbago 12/24/2010  . Orthostatic hypotension 11/05/2010  . Other specified cardiac dysrhythmias(427.89) 12/24/2010  . Pain in joint, pelvic region and thigh 08/06/2010  . Peptic ulcer, unspecified site, unspecified as acute or chronic, without mention of hemorrhage, perforation, or obstruction 08/01/2010  . Renal failure, acute (HCC)   . Swelling, mass, or lump in head and neck 01/03/2011  .  Ulcer    Stomach  . Unspecified disorder of kidney and ureter 08/01/2010  . Unspecified hearing loss 08/01/2010  . Unspecified vitamin D deficiency 08/06/2010  . Urinary frequency 09/13/2010  . Vertigo    Past Surgical History:  Procedure Laterality Date  . ABDOMINAL AORTIC ANEURYSM REPAIR  12-18-10   Stent graft repair of AAA  . APPENDECTOMY    . CATARACT EXTRACTION    . HEMORRHOID  SURGERY  2000  . INGUINAL HERNIA REPAIR Left 2005  . TRANSTHORACIC ECHOCARDIOGRAM  07/19/2010    Left ventricle: There is hypokinesis of the inferior wall and  posterior wall. The EF is 35-40%. The cavity size was normal    Allergies  Allergen Reactions  . Aleve [Naproxen] Other (See Comments)    Per MAR  . Indocin [Indomethacin] Other (See Comments)    Per [facility]    Outpatient Encounter Medications as of 01/27/2018  Medication Sig  . acetaminophen (TYLENOL) 500 MG tablet Take 500 mg by mouth 2 (two) times daily.  Marland Kitchen acetaminophen (TYLENOL) 500 MG tablet Take 500 mg by mouth every 8 (eight) hours as needed.  Marland Kitchen alum & mag hydroxide-simeth (MINTOX) 200-200-20 MG/5ML suspension Take 30 mLs by mouth every 4 (four) hours as needed for indigestion or heartburn (for 48 hours and call MD if symptoms persist).   . benzocaine (ORAJEL) 10 % mucosal gel Use as directed 1 application in the mouth or throat as needed for mouth pain.  . bisacodyl (DULCOLAX) 10 MG suppository Place 10 mg rectally every other day. For constipation Hold for loose stools  . Cholecalciferol 25 MCG (1000 UT) tablet Take 1,000 Units by mouth daily.  Marland Kitchen DEXTRAN 70-HYPROMELLOSE, PF, OP Place 1 drop into both eyes as needed (for dry eyes). wait 3-5 minutes between eye drops  . Dextromethorphan-Guaifenesin (TUSSIN COUGH DM PO) Take 5 mLs by mouth every 4 (four) hours as needed.  . finasteride (PROSCAR) 5 MG tablet Take 5 mg by mouth daily.   . fluocinonide (LIDEX) 0.05 % external solution Apply 1 application topically as needed. apply to scalp for itching  . fluticasone (FLONASE) 50 MCG/ACT nasal spray Place 2 sprays into both nostrils daily.  Marland Kitchen gabapentin (NEURONTIN) 100 MG capsule Take 100 mg by mouth daily with supper.   . hydrALAZINE (APRESOLINE) 25 MG tablet Take 25 mg by mouth 2 (two) times daily.  . hydrocortisone 1 % lotion Apply 1 application topically as needed for itching.  Marland Kitchen ketoconazole (NIZORAL) 2 % shampoo Apply 1  application topically once a week.  . levothyroxine (SYNTHROID, LEVOTHROID) 50 MCG tablet Take 50 mcg by mouth daily before breakfast.   . Lidocaine (ASPERCREME LIDOCAINE) 4 % PTCH Apply 1 patch topically See admin instructions. 1 patch applied once a day to mid lower back  . losartan (COZAAR) 50 MG tablet Take 50 mg by mouth 2 (two) times daily. Hold if Systolic B/P <110 and check Blood Pressure and Heart Rate before giving Losartan; Also assess and document pain level at time of blood pressure check  . magic mouthwash w/lidocaine SOLN Take 5-10 mLs by mouth every 4 (four) hours as needed for mouth pain.  . magnesium citrate SOLN Take by mouth as needed for severe constipation. Per ED physician resident may take 10 oz by mouth followed by 8 oz of water as needed every 4th day if no bowel movement in 3 days  . Misc. Devices (SLEEPRIGHT BREATHE AID) MISC 1 strip at bedtime.  . Nutritional Supplements (FEEDING SUPPLEMENT, BOOST  BREEZE,) LIQD Take 1 Can by mouth 2 (two) times daily between meals.  Marland Kitchen. omeprazole (PRILOSEC OTC) 20 MG tablet Take 20 mg by mouth daily.  Marland Kitchen. oxyCODONE (OXYCONTIN) 10 mg 12 hr tablet Take 1 tablet (10 mg total) by mouth daily.  . polyethylene glycol (MIRALAX / GLYCOLAX) packet Take 17 g by mouth 2 (two) times daily.   . sennosides-docusate sodium (SENOKOT-S) 8.6-50 MG tablet Take 2 tablets by mouth at bedtime.  . sodium chloride (OCEAN) 0.65 % SOLN nasal spray Place 1 spray into both nostrils 3 (three) times daily as needed for congestion.  . triamcinolone (KENALOG) 0.1 % paste Use as directed 1 application in the mouth or throat every 4 (four) hours as needed.   No facility-administered encounter medications on file as of 01/27/2018.     Review of Systems  Constitutional: Negative for appetite change, chills, fatigue and fever.  HENT: Positive for hearing loss. Negative for congestion, rhinorrhea, sinus pressure, sinus pain, sneezing and sore throat.   Eyes: Positive for  visual disturbance. Negative for discharge, redness and itching.  Respiratory: Negative for cough, chest tightness, shortness of breath and wheezing.   Cardiovascular: Negative for chest pain, palpitations and leg swelling.  Gastrointestinal: Negative for abdominal distention, abdominal pain, constipation, diarrhea, nausea and vomiting.  Genitourinary: Negative for dysuria, flank pain, frequency and urgency.  Musculoskeletal: Positive for arthralgias, back pain and gait problem.  Skin: Negative for color change, pallor and rash.       Left arm skin tear,abrasion to upper back   Neurological: Negative for dizziness, light-headedness and headaches.  Psychiatric/Behavioral: Negative for agitation, confusion and sleep disturbance. The patient is not nervous/anxious.     Immunization History  Administered Date(s) Administered  . Influenza-Unspecified 12/27/2010, 12/29/2011, 12/27/2012, 12/13/2013, 12/14/2014, 12/20/2015, 12/17/2016, 12/14/2017  . PPD Test 07/30/2010, 08/14/2010  . Pneumococcal Conjugate-13 11/04/2016  . Pneumococcal Polysaccharide-23 03/10/2005  . Td 03/11/1999  . Tdap 10/31/2016   Pertinent  Health Maintenance Due  Topic Date Due  . INFLUENZA VACCINE  Completed  . PNA vac Low Risk Adult  Completed   Fall Risk  11/04/2017 10/29/2016 08/07/2015 08/07/2015 04/10/2015  Falls in the past year? No Yes Yes No No  Comment - - 06/02/15- skin tear right arm - -  Number falls in past yr: - 1 - - -  Injury with Fall? - No - - -    Vitals:   01/27/18 1320  BP: 114/86  Pulse: 86  Resp: (!) 22  Temp: (!) 96.3 F (35.7 C)  TempSrc: Oral  SpO2: 96%  Weight: 144 lb 12.8 oz (65.7 kg)  Height: 5\' 9"  (1.753 m)   Body mass index is 21.38 kg/m. Physical Exam  Constitutional: He is oriented to person, place, and time.  Frail elderly in no acute distress   HENT:  Head: Normocephalic.  Mouth/Throat: Oropharynx is clear and moist. No oropharyngeal exudate.  Hearing aids in place     Eyes: Pupils are equal, round, and reactive to light. Conjunctivae and EOM are normal. Right eye exhibits no discharge. Left eye exhibits no discharge. No scleral icterus.  Corrective lens in place   Cardiovascular: Intact distal pulses. Exam reveals no friction rub.  Murmur heard. Pulmonary/Chest: Effort normal and breath sounds normal. No respiratory distress. He has no wheezes. He has no rales.  Abdominal: Soft. Bowel sounds are normal. He exhibits no distension and no mass. There is no tenderness. There is no rebound and no guarding.  Musculoskeletal: He exhibits no  edema or tenderness.  Moves x 4 extremities.unsteady gait uses power wheelchair and FWW for short distance.   Neurological: He is oriented to person, place, and time. Gait abnormal.  Skin: Skin is warm and dry. Rash noted. No erythema. No pallor.  1. Left arm skin tear with steri-strips in place dry,clean and intact surrounding skin tissues without signs of infections.   2. Abrasion x 2 to left upper back red without any signs of infections.  3. Right back purple bruise none tender to touch.   Psychiatric: He has a normal mood and affect. His speech is normal and behavior is normal.  Nursing note and vitals reviewed.   Labs reviewed: Recent Labs    04/24/17 0649 04/25/17 0410 05/06/17 2226 06/04/17 09/21/17 09/26/17 1018  NA 136 135 136 140  140 135* 139  K 4.7 4.1 4.8 4.7  4.7 5.0 5.0  CL 103 103 105 108 106 111  CO2 24 24 22 24 20 22   GLUCOSE 106* 95 93  --   --  114*  BUN 23* 20 27* 33* 34* 54*  CREATININE 1.13 1.10 1.08 1.1  1.11 1.3 1.47*  CALCIUM 8.7* 8.4* 8.5* 9.3  9.3 8.6 8.5*  MG 2.2  --   --   --   --   --   PHOS 3.5  --   --   --   --   --    Recent Labs    04/23/17 1231 05/06/17 2226 09/21/17 09/26/17 1018  AST 23 19 16 19   ALT 11* 13* 9* 11  ALKPHOS 105 96 110 104  BILITOT 1.1 0.6  --  0.4  PROT 7.0 6.3* 6.0 6.3*  ALBUMIN 3.8 3.4* 3.7 3.5   Recent Labs    04/25/17 0410  05/06/17 2226 06/18/17 09/21/17 09/26/17 1018  WBC 6.7 5.8 4.7 9.1 6.9  NEUTROABS  --  3.0  --  6,552 4.4  HGB 10.6* 9.6* 9.7 9.3* 9.4*  HCT 32.1* 29.3* 29.3 27* 29.2*  MCV 89.4 88.8  --   --  93.9  PLT 154 208  --  161 167   Lab Results  Component Value Date   TSH 2.84 09/21/2017   Lab Results  Component Value Date   HGBA1C 5.6 04/06/2017   Lab Results  Component Value Date   CHOL 145 04/06/2017   HDL 47 04/06/2017   LDLCALC 89 04/06/2017   TRIG 47 04/06/2017   CHOLHDL 3.1 04/06/2017    Significant Diagnostic Results in last 30 days:  No results found.  Assessment/Plan 1. Unsteady gait He remains high risk for falls.Physical therapy order in place for gait stability,exercise and muscle strengthening.check CBC/diff and BMP 01/28/2018  to rule out infectious and metabolic etiologies.   2. Fall at nursing home, initial encounter Observed by staff lying on the bathroom floor  01/22/2018.Denies hitting head.skin tears and abrasion sustained. B/p reviewed stable. Continue with Physical therapy as above.Fall and safety precautions.   3. Skin tear of left elbow without complication, initial encounter Edges well approximated steri-strips in place no signs or symptoms on infections noted.continue to monitor.   4. Abrasion of left side of back, initial encounter No signs of infections.continue to monitor.   5. Bruise, trunk, initial encounter Purple bruise noted tender to palpation.continue to monitor for now.   Family/ staff Communication: Reviewed plan of care with patient and facility Nurse.   Labs/tests ordered: CBC/diff and BMP 01/28/2018   Caesar Bookman, NP

## 2018-01-28 DIAGNOSIS — I10 Essential (primary) hypertension: Secondary | ICD-10-CM | POA: Diagnosis not present

## 2018-01-28 DIAGNOSIS — Z9181 History of falling: Secondary | ICD-10-CM | POA: Diagnosis not present

## 2018-01-28 LAB — BASIC METABOLIC PANEL
BUN: 36 — AB (ref 4–21)
Creatinine: 1.3 (ref 0.6–1.3)
GLUCOSE: 95
Potassium: 5.5 — AB (ref 3.4–5.3)
Sodium: 139 (ref 137–147)

## 2018-01-28 LAB — CBC AND DIFFERENTIAL
HEMATOCRIT: 30 — AB (ref 41–53)
Hemoglobin: 9.9 — AB (ref 13.5–17.5)
PLATELETS: 191 (ref 150–399)
WBC: 6.4

## 2018-01-31 ENCOUNTER — Encounter: Payer: Self-pay | Admitting: Family

## 2018-01-31 DIAGNOSIS — N289 Disorder of kidney and ureter, unspecified: Secondary | ICD-10-CM | POA: Diagnosis not present

## 2018-01-31 DIAGNOSIS — Z862 Personal history of diseases of the blood and blood-forming organs and certain disorders involving the immune mechanism: Secondary | ICD-10-CM | POA: Diagnosis not present

## 2018-01-31 DIAGNOSIS — R05 Cough: Secondary | ICD-10-CM | POA: Diagnosis not present

## 2018-01-31 DIAGNOSIS — E785 Hyperlipidemia, unspecified: Secondary | ICD-10-CM | POA: Diagnosis not present

## 2018-01-31 DIAGNOSIS — I10 Essential (primary) hypertension: Secondary | ICD-10-CM | POA: Diagnosis not present

## 2018-01-31 DIAGNOSIS — I519 Heart disease, unspecified: Secondary | ICD-10-CM | POA: Diagnosis not present

## 2018-01-31 DIAGNOSIS — E559 Vitamin D deficiency, unspecified: Secondary | ICD-10-CM | POA: Diagnosis not present

## 2018-01-31 LAB — BASIC METABOLIC PANEL
BUN: 38 — AB (ref 4–21)
CREATININE: 1.3 (ref 0.6–1.3)
GLUCOSE: 89
POTASSIUM: 5.4 — AB (ref 3.4–5.3)
Sodium: 135 — AB (ref 137–147)

## 2018-01-31 LAB — CBC AND DIFFERENTIAL
HCT: 30 — AB (ref 41–53)
HEMOGLOBIN: 9.8 — AB (ref 13.5–17.5)
Platelets: 198 (ref 150–399)
WBC: 7.1

## 2018-02-01 DIAGNOSIS — N390 Urinary tract infection, site not specified: Secondary | ICD-10-CM | POA: Diagnosis not present

## 2018-02-02 DIAGNOSIS — E875 Hyperkalemia: Secondary | ICD-10-CM | POA: Diagnosis not present

## 2018-02-02 LAB — BASIC METABOLIC PANEL
BUN: 34 — AB (ref 4–21)
CREATININE: 1 (ref 0.6–1.3)
Glucose: 77
POTASSIUM: 5 (ref 3.4–5.3)
Sodium: 138 (ref 137–147)

## 2018-02-09 DIAGNOSIS — N390 Urinary tract infection, site not specified: Secondary | ICD-10-CM | POA: Diagnosis not present

## 2018-02-10 ENCOUNTER — Encounter: Payer: Self-pay | Admitting: Family

## 2018-02-10 ENCOUNTER — Non-Acute Institutional Stay: Payer: Medicare PPO | Admitting: Family

## 2018-02-10 DIAGNOSIS — I10 Essential (primary) hypertension: Secondary | ICD-10-CM | POA: Diagnosis not present

## 2018-02-10 NOTE — Progress Notes (Signed)
Location:  [facility] Room Number: 32 Place of Service:  [phone]) Provider: Fidelia Cathers FNP-C  Fotios Amos, Donalee Citrin, NP  Patient Care Team: Stephanye Finnicum, Donalee Citrin, NP as PCP - General (Family Medicine)  Extended Emergency Contact Information Primary Emergency Contact: [name], [place] Home Phone: [phone] Mobile Phone: [phone] Relation: Son Secondary Emergency Contact: [name] Mobile Phone: [phone] Relation: Grandson  Code Status:  DNR Goals of care: Advanced Directive information Advanced Directives 02/10/2018  Does Patient Have a Medical Advance Directive? Yes  Type of Estate agent of [place];Out of facility DNR (pink MOST or yellow form);Living will  Does patient want to make changes to medical advance directive? No - Patient declined  Copy of Healthcare Power of Attorney in Chart? Yes - validated most recent copy scanned in chart (See row information)  Would patient like information on creating a medical advance directive? -  Pre-existing out of facility DNR order (yellow form or pink MOST form) Yellow form placed in chart (order not valid for inpatient use);Pink MOST form placed in chart (order not valid for inpatient use)     Chief Complaint  Patient presents with  . Acute Visit    High blood pressure     HPI:  Pt is a 84 y.o. male seen today at [facility] for an acute visit for evaluation of high blood pressure.He is seen in his room today.He was able to verbalize and hear provider well.Physical Therapy provided patient with a pockettalker.therapy has recommended Patient's POA to obtain devices to assist with patient's hearing.He denies any acute issues during visit.His blood pressure log reviewed b/p readings within target limits in the morning but afternoon blood pressures readings systolic pressure > 160's.he denies any  dizziness,headache,shortness of breath,nausea or vomiting.  Facility Nurse reports patient's daughter in law requested patient to have a CT scan to evaluate cognition states family noted increased confusion.Currently awaiting urine culture results.MMSE done patient scored 21/30 with limitation due to hard of hearing and poor vision.  Past Medical History:  Diagnosis Date  . AAA (abdominal aortic aneurysm) (HCC)   . Abnormality of gait 08/01/2010  . Adhesive capsulitis of shoulder 08/01/2010   "Frozen" right shoulder  . Anemia   . Anxiety state, unspecified 08/01/2010  . Arthritis   . Congestive heart failure, unspecified 08/01/2010  . Cough 08/07/2015  . Disorders of bursae and tendons in shoulder region, unspecified 08/01/2010  . Edema 08/01/2010  . Fall   . Fecal impaction (HCC)   . GERD (gastroesophageal reflux disease)   . Hiatal hernia   . Hypercalcemia   . Hyperlipidemia   . Hypertension   . Hypertrophy of prostate without urinary obstruction and other lower urinary tract symptoms (LUTS) 08/01/2010  . Hyponatremia   . Hypothyroidism   . Infectious colitis   . Insomnia, unspecified 08/01/2010  . Joint pain   . Leg pain    with walking  . Lumbago 12/24/2010  . Orthostatic hypotension 11/05/2010  . Other specified cardiac dysrhythmias(427.89) 12/24/2010  . Pain in joint, pelvic region and thigh 08/06/2010  . Peptic ulcer, unspecified site, unspecified as acute or chronic, without mention of hemorrhage, perforation, or obstruction 08/01/2010  . Renal failure, acute (HCC)   . Swelling, mass, or lump in head and neck 01/03/2011  . Ulcer    Stomach  . Unspecified disorder of kidney and ureter 08/01/2010  .  Unspecified hearing loss 08/01/2010  . Unspecified vitamin D deficiency 08/06/2010  . Urinary frequency 09/13/2010  . Vertigo    Past Surgical History:  Procedure Laterality Date  . ABDOMINAL AORTIC ANEURYSM REPAIR  12-18-10   Stent graft repair of AAA  . APPENDECTOMY    . CATARACT  EXTRACTION    . HEMORRHOID SURGERY  2000  . INGUINAL HERNIA REPAIR Left 2005  . TRANSTHORACIC ECHOCARDIOGRAM  07/19/2010    Left ventricle: There is hypokinesis of the inferior wall and  posterior wall. The EF is 35-40%. The cavity size was normal    Allergies  Allergen Reactions  . Aleve [Naproxen] Other (See Comments)    Per MAR  . Indocin [Indomethacin] Other (See Comments)    Per [facility]    Outpatient Encounter Medications as of 02/10/2018  Medication Sig  . acetaminophen (TYLENOL) 500 MG tablet Take 500 mg by mouth 2 (two) times daily.  Marland Kitchen acetaminophen (TYLENOL) 500 MG tablet Take 500 mg by mouth every 8 (eight) hours as needed.  Marland Kitchen alum & mag hydroxide-simeth (MINTOX) 200-200-20 MG/5ML suspension Take 30 mLs by mouth every 4 (four) hours as needed for indigestion or heartburn (for 48 hours and call MD if symptoms persist).   . benzocaine (ORAJEL) 10 % mucosal gel Use as directed 1 application in the mouth or throat as needed for mouth pain.  . bisacodyl (DULCOLAX) 10 MG suppository Place 10 mg rectally every other day. For constipation Hold for loose stools  . Cholecalciferol 25 MCG (1000 UT) tablet Take 1,000 Units by mouth daily.  Marland Kitchen DEXTRAN 70-HYPROMELLOSE, PF, OP Place 1 drop into both eyes as needed (for dry eyes). wait 3-5 minutes between eye drops  . Dextromethorphan-Guaifenesin (TUSSIN COUGH DM PO) Take 5 mLs by mouth every 4 (four) hours as needed.  . finasteride (PROSCAR) 5 MG tablet Take 5 mg by mouth daily.   . fluocinonide (LIDEX) 0.05 % external solution Apply 1 application topically as needed. apply to scalp for itching  . fluticasone (FLONASE) 50 MCG/ACT nasal spray Place 2 sprays into both nostrils daily.  Marland Kitchen gabapentin (NEURONTIN) 100 MG capsule Take 100 mg by mouth daily with supper.   . hydrALAZINE (APRESOLINE) 25 MG tablet Take 25 mg by mouth 2 (two) times daily.  . hydrocortisone 1 % lotion Apply 1 application topically as needed for itching.  Marland Kitchen ketoconazole  (NIZORAL) 2 % shampoo Apply 1 application topically once a week.  . levothyroxine (SYNTHROID, LEVOTHROID) 50 MCG tablet Take 50 mcg by mouth daily before breakfast.   . Lidocaine (ASPERCREME LIDOCAINE) 4 % PTCH Apply 1 patch topically See admin instructions. 1 patch applied once a day to mid lower back  . losartan (COZAAR) 50 MG tablet Take 50 mg by mouth 2 (two) times daily. Hold if Systolic B/P <110 and check Blood Pressure and Heart Rate before giving Losartan; Also assess and document pain level at time of blood pressure check  . magic mouthwash w/lidocaine SOLN Take 5-10 mLs by mouth every 4 (four) hours as needed for mouth pain.  . magnesium citrate SOLN Take by mouth as needed for severe constipation. Per ED physician resident may take 10 oz by mouth followed by 8 oz of water as needed every 4th day if no bowel movement in 3 days  . Misc. Devices (SLEEPRIGHT BREATHE AID) MISC 1 strip at bedtime.  . Nutritional Supplements (FEEDING SUPPLEMENT, BOOST BREEZE,) LIQD Take 1 Can by mouth 2 (two) times daily between meals.  Marland Kitchen omeprazole (  PRILOSEC OTC) 20 MG tablet Take 20 mg by mouth daily.  Marland Kitchen oxyCODONE (OXYCONTIN) 10 mg 12 hr tablet Take 1 tablet (10 mg total) by mouth daily.  . polyethylene glycol (MIRALAX / GLYCOLAX) packet Take 17 g by mouth 2 (two) times daily.   . sennosides-docusate sodium (SENOKOT-S) 8.6-50 MG tablet Take 2 tablets by mouth at bedtime.  . sodium chloride (OCEAN) 0.65 % SOLN nasal spray Place 1 spray into both nostrils 3 (three) times daily as needed for congestion.  . triamcinolone (KENALOG) 0.1 % paste Use as directed 1 application in the mouth or throat every 4 (four) hours as needed.   No facility-administered encounter medications on file as of 02/10/2018.     Review of Systems  Constitutional: Negative for appetite change, chills, fatigue, fever and unexpected weight change.  HENT: Positive for hearing loss. Negative for congestion, rhinorrhea, sinus pressure, sinus  pain, sneezing and sore throat.   Eyes: Positive for visual disturbance. Negative for discharge, redness and itching.       Wears eye glasses   Respiratory: Negative for cough, chest tightness, shortness of breath and wheezing.   Cardiovascular: Negative for chest pain, palpitations and leg swelling.  Gastrointestinal: Negative for abdominal distention, abdominal pain, constipation, diarrhea, nausea and vomiting.  Genitourinary: Negative for dysuria, flank pain and urgency.       Awaiting recent urine culture results.   Musculoskeletal: Positive for arthralgias, back pain and gait problem.  Skin: Negative for color change, pallor, rash and wound.  Neurological: Negative for dizziness, light-headedness and headaches.  Psychiatric/Behavioral: Negative for agitation, confusion and sleep disturbance. The patient is not nervous/anxious.     Immunization History  Administered Date(s) Administered  . Influenza-Unspecified 12/27/2010, 12/29/2011, 12/27/2012, 12/13/2013, 12/14/2014, 12/20/2015, 12/17/2016, 12/14/2017  . PPD Test 07/30/2010, 08/14/2010  . Pneumococcal Conjugate-13 11/04/2016  . Pneumococcal Polysaccharide-23 03/10/2005  . Td 03/11/1999  . Tdap 10/31/2016   Pertinent  Health Maintenance Due  Topic Date Due  . INFLUENZA VACCINE  Completed  . PNA vac Low Risk Adult  Completed   Fall Risk  11/04/2017 10/29/2016 08/07/2015 08/07/2015 04/10/2015  Falls in the past year? No Yes Yes No No  Comment - - 06/02/15- skin tear right arm - -  Number falls in past yr: - 1 - - -  Injury with Fall? - No - - -   Functional Status Survey:    Vitals:   02/10/18 1158  BP: (!) 173/89  Pulse: 70  Resp: 20  Temp: (!) 97.5 F (36.4 C)  SpO2: 96%  Weight: 144 lb 9.6 oz (65.6 kg)  Height: 5\' 9"  (1.753 m)   Body mass index is 21.35 kg/m. Physical Exam  Constitutional: He is oriented to person, place, and time. He appears well-developed.  Elderly in no acute distress   HENT:  Head:  Normocephalic.  Right Ear: External ear normal.  Left Ear: External ear normal.  Mouth/Throat: Oropharynx is clear and moist. No oropharyngeal exudate.  Left hearing aid   Eyes: Pupils are equal, round, and reactive to light. Conjunctivae and EOM are normal. Right eye exhibits no discharge. Left eye exhibits no discharge. No scleral icterus.  Corrective lens in place   Neck: Normal range of motion. No JVD present. No thyromegaly present.  Cardiovascular: Intact distal pulses. Exam reveals no gallop and no friction rub.  Murmur heard. Pulmonary/Chest: Effort normal and breath sounds normal. No respiratory distress. He has no wheezes. He has no rales.  Abdominal: Soft. Bowel sounds are  normal. He exhibits no distension and no mass. There is no tenderness. There is no rebound and no guarding.  Musculoskeletal: He exhibits no edema or tenderness.  Unsteady gait uses scooter and FWW for short distances.   Lymphadenopathy:    He has no cervical adenopathy.  Neurological: He is oriented to person, place, and time. Gait abnormal.  Skin: Skin is warm and dry. No rash noted. No erythema. No pallor.  Psychiatric: He has a normal mood and affect. His speech is normal and behavior is normal. Judgment and thought content normal.  Scored 21/30 on MMSE 02/08/2018  Nursing note and vitals reviewed.   Labs reviewed: Recent Labs    04/24/17 0649 04/25/17 0410 05/06/17 2226 06/04/17 09/21/17 09/26/17 1018 01/28/18 01/31/18 02/02/18  NA 136 135 136 140  140 135* 139 139 135* 138  K 4.7 4.1 4.8 4.7  4.7 5.0 5.0 5.5* 5.4* 5.0  CL 103 103 105 108 106 111  --   --   --   CO2 24 24 22 24 20 22   --   --   --   GLUCOSE 106* 95 93  --   --  114*  --   --   --   BUN 23* 20 27* 33* 34* 54* 36* 38* 34*  CREATININE 1.13 1.10 1.08 1.1  1.11 1.3 1.47* 1.3 1.3 1.0  CALCIUM 8.7* 8.4* 8.5* 9.3  9.3 8.6 8.5*  --   --   --   MG 2.2  --   --   --   --   --   --   --   --   PHOS 3.5  --   --   --   --   --   --   --    --    Recent Labs    04/23/17 1231 05/06/17 2226 09/21/17 09/26/17 1018  AST 23 19 16 19   ALT 11* 13* 9* 11  ALKPHOS 105 96 110 104  BILITOT 1.1 0.6  --  0.4  PROT 7.0 6.3* 6.0 6.3*  ALBUMIN 3.8 3.4* 3.7 3.5   Recent Labs    04/25/17 0410 05/06/17 2226  09/21/17 09/26/17 1018 01/28/18 01/31/18  WBC 6.7 5.8   < > 9.1 6.9 6.4 7.1  NEUTROABS  --  3.0  --  6,552 4.4  --   --   HGB 10.6* 9.6*   < > 9.3* 9.4* 9.9* 9.8*  HCT 32.1* 29.3*   < > 27* 29.2* 30* 30*  MCV 89.4 88.8  --   --  93.9  --   --   PLT 154 208  --  161 167 191 198   < > = values in this interval not displayed.   Lab Results  Component Value Date   TSH 2.84 09/21/2017   Lab Results  Component Value Date   HGBA1C 5.6 04/06/2017   Lab Results  Component Value Date   CHOL 145 04/06/2017   HDL 47 04/06/2017   LDLCALC 89 04/06/2017   TRIG 47 04/06/2017   CHOLHDL 3.1 04/06/2017    Significant Diagnostic Results in last 30 days:  No results found.  Assessment/Plan   Essential hypertension SBP > 160 in the afternoon but within normal limits in the morning.continue on Losartan 50 mg tablet twice daily and Hydralazine 25 mg tablet twice daily.Add clonidine 0.1 mg tablet twice daily as needed for SBP> 160   Family/ staff Communication: Reviewed plan of care with patient and facility  Nurse.   Rockne Coons ordered: None   Caesar Bookman, NP

## 2018-03-11 DIAGNOSIS — I10 Essential (primary) hypertension: Secondary | ICD-10-CM | POA: Diagnosis not present

## 2018-03-11 LAB — BASIC METABOLIC PANEL
BUN: 44 — AB (ref 4–21)
Creatinine: 1.2 (ref 0.6–1.3)
Glucose: 84
Potassium: 5.5 — AB (ref 3.4–5.3)
Sodium: 135 — AB (ref 137–147)

## 2018-03-15 DIAGNOSIS — I10 Essential (primary) hypertension: Secondary | ICD-10-CM | POA: Diagnosis not present

## 2018-03-30 ENCOUNTER — Non-Acute Institutional Stay: Payer: Medicare PPO | Admitting: Nurse Practitioner

## 2018-03-30 ENCOUNTER — Encounter: Payer: Self-pay | Admitting: Nurse Practitioner

## 2018-03-30 DIAGNOSIS — N401 Enlarged prostate with lower urinary tract symptoms: Secondary | ICD-10-CM | POA: Diagnosis not present

## 2018-03-30 DIAGNOSIS — I10 Essential (primary) hypertension: Secondary | ICD-10-CM

## 2018-03-30 DIAGNOSIS — N183 Chronic kidney disease, stage 3 unspecified: Secondary | ICD-10-CM

## 2018-03-30 DIAGNOSIS — M792 Neuralgia and neuritis, unspecified: Secondary | ICD-10-CM | POA: Diagnosis not present

## 2018-03-30 DIAGNOSIS — K5901 Slow transit constipation: Secondary | ICD-10-CM

## 2018-03-30 DIAGNOSIS — K219 Gastro-esophageal reflux disease without esophagitis: Secondary | ICD-10-CM

## 2018-03-30 DIAGNOSIS — D509 Iron deficiency anemia, unspecified: Secondary | ICD-10-CM | POA: Diagnosis not present

## 2018-03-30 DIAGNOSIS — E039 Hypothyroidism, unspecified: Secondary | ICD-10-CM

## 2018-03-30 DIAGNOSIS — R3911 Hesitancy of micturition: Secondary | ICD-10-CM

## 2018-03-30 NOTE — Assessment & Plan Note (Signed)
HTN, elevated BP, continue Losartan 50mg  bid, Hydralazine 25mg  bid, Clonidine 0.1mg  bid prn. He denied headache, change of vision, dizziness, chest pain/pressure, SOB, or palpitation.

## 2018-03-30 NOTE — Assessment & Plan Note (Signed)
BHP no urinary retention, continue Finasteride 5mg  qd.

## 2018-03-30 NOTE — Assessment & Plan Note (Signed)
GERD stable, continue Omeprazole 20mg qd.  

## 2018-03-30 NOTE — Assessment & Plan Note (Signed)
Peripheral neuropathy, pain is managed, continue Tylenol 1000mg  bid, Oxycodone 10mg  qd Gabapentin 100mg  qd. Marland Kitchen

## 2018-03-30 NOTE — Assessment & Plan Note (Signed)
Stable, last Hgb 9.8 01/31/18.

## 2018-03-30 NOTE — Assessment & Plan Note (Signed)
Hypothyroidism, continue  Levothyroxine qd, last TSH 2.84 09/21/17

## 2018-03-30 NOTE — Assessment & Plan Note (Signed)
No constipation, continue  Dulcolax 10mg  suppository qod, MiraLax bid, Senokot S II qd.

## 2018-03-30 NOTE — Assessment & Plan Note (Signed)
Stable, last Na 138, K 5.0, Bun 34, creat 1.0

## 2018-03-30 NOTE — Progress Notes (Signed)
Location:  [facility] Room Number: 32 Place of Service:  ALF [phone]) Provider:  Chipper Oman  NP  [name], MD  Patient Care Team: [name], MD as PCP - General (Internal Medicine) Yussef Jorge X, NP as Nurse Practitioner (Internal Medicine)  Extended Emergency Contact Information Primary Emergency Contact: [name], [place] Home Phone: [phone] Mobile Phone: [phone] Relation: Son Secondary Emergency Contact: [name] [place] Mobile Phone: [phone] Relation: Grandson  Code Status:  DNR Goals of care: Advanced Directive information Advanced Directives 03/30/2018  Does Patient Have a Medical Advance Directive? Yes  Type of Estate agent of [place];Living will;Out of facility DNR (pink MOST or yellow form)  Does patient want to make changes to medical advance directive? No - Patient declined  Copy of Healthcare Power of Attorney in Chart? Yes - validated most recent copy scanned in chart (See row information)  Would patient like information on creating a medical advance directive? -  Pre-existing out of facility DNR order (yellow form or pink MOST form) Yellow form placed in chart (order not valid for inpatient use);Pink MOST form placed in chart (order not valid for inpatient use)     Chief Complaint  Patient presents with  . Medical Management of Chronic Issues    HPI:  Pt is a 85 y.o. male seen today for medical management of chronic diseases.    The patient has history of HTN, elevated BP while on Losartan 50mg  bid, Hydralazine 25mg  bid, Clonidine 0.1mg  bid prn. He denied headache, change of vision, dizziness, chest pain/pressure, SOB, or palpitation. Peripheral neuropathy, pain is managed with Tylenol 1000mg  bid, Oxycodone 10mg  qd Gabapentin 100mg  qd. . No constipation while on Dulcolax 10mg  suppository qod, MiraLax bid, Senokot S II qd. BHP  no urinary retention, on Finasteride 5mg  qd. Hypothyroidism, on Levothyroxine qd, last TSH 2.84 09/21/17. GERD stable on Omeprazole 20mg  qd.   Past Medical History:  Diagnosis Date  . AAA (abdominal aortic aneurysm) (HCC)   . Abnormality of gait 08/01/2010  . Adhesive capsulitis of shoulder 08/01/2010   "Frozen" right shoulder  . Anemia   . Anxiety state, unspecified 08/01/2010  . Arthritis   . Congestive heart failure, unspecified 08/01/2010  . Cough 08/07/2015  . Disorders of bursae and tendons in shoulder region, unspecified 08/01/2010  . Edema 08/01/2010  . Fall   . Fecal impaction (HCC)   . GERD (gastroesophageal reflux disease)   . Hiatal hernia   . Hypercalcemia   . Hyperlipidemia   . Hypertension   . Hypertrophy of prostate without urinary obstruction and other lower urinary tract symptoms (LUTS) 08/01/2010  . Hyponatremia   . Hypothyroidism   . Infectious colitis   . Insomnia, unspecified 08/01/2010  . Joint pain   . Leg pain    with walking  . Lumbago 12/24/2010  . Orthostatic hypotension 11/05/2010  . Other specified cardiac dysrhythmias(427.89) 12/24/2010  . Pain in joint, pelvic region and thigh 08/06/2010  . Peptic ulcer, unspecified site, unspecified as acute or chronic, without mention of hemorrhage, perforation, or obstruction 08/01/2010  . Renal failure, acute (HCC)   . Swelling, mass, or lump in head and neck 01/03/2011  . Ulcer    Stomach  . Unspecified disorder of kidney and ureter 08/01/2010  . Unspecified hearing loss 08/01/2010  . Unspecified vitamin D deficiency 08/06/2010  . Urinary frequency 09/13/2010  .  Vertigo    Past Surgical History:  Procedure Laterality Date  . ABDOMINAL AORTIC ANEURYSM REPAIR  12-18-10   Stent graft repair of AAA  . APPENDECTOMY    . CATARACT EXTRACTION    . HEMORRHOID SURGERY  2000  . INGUINAL HERNIA REPAIR Left 2005  . TRANSTHORACIC ECHOCARDIOGRAM  07/19/2010    Left ventricle: There is hypokinesis of the inferior wall and   posterior wall. The EF is 35-40%. The cavity size was normal    Allergies  Allergen Reactions  . Aleve [Naproxen] Other (See Comments)    Per MAR  . Indocin [Indomethacin] Other (See Comments)    Per [facility]    Outpatient Encounter Medications as of 03/30/2018  Medication Sig  . acetaminophen (TYLENOL) 500 MG tablet Take 1,000 mg by mouth 2 (two) times daily.   Marland Kitchen acetaminophen (TYLENOL) 500 MG tablet Take 500 mg by mouth every 8 (eight) hours as needed.  Marland Kitchen alum & mag hydroxide-simeth (MINTOX) 200-200-20 MG/5ML suspension Take 30 mLs by mouth every 4 (four) hours as needed for indigestion or heartburn (for 48 hours and call MD if symptoms persist).   . benzocaine (ORAJEL) 10 % mucosal gel Use as directed 1 application in the mouth or throat as needed for mouth pain.  . bisacodyl (DULCOLAX) 10 MG suppository Place 10 mg rectally every other day. For constipation Hold for loose stools  . Cholecalciferol 25 MCG (1000 UT) tablet Take 1,000 Units by mouth daily.  . cloNIDine (CATAPRES) 0.1 MG tablet Take 0.1 mg by mouth 2 (two) times daily as needed.  Marland Kitchen DEXTRAN 70-HYPROMELLOSE, PF, OP Place 1 drop into both eyes as needed (for dry eyes). wait 3-5 minutes between eye drops  . Dextromethorphan-Guaifenesin (TUSSIN COUGH DM PO) Take 5 mLs by mouth every 4 (four) hours as needed.  . finasteride (PROSCAR) 5 MG tablet Take 5 mg by mouth daily.   . fluocinonide (LIDEX) 0.05 % external solution Apply 1 application topically as needed. apply to scalp for itching  . fluticasone (FLONASE) 50 MCG/ACT nasal spray Place 2 sprays into both nostrils daily.  Marland Kitchen gabapentin (NEURONTIN) 100 MG capsule Take 100 mg by mouth daily with supper.   . hydrALAZINE (APRESOLINE) 25 MG tablet Take 25 mg by mouth 2 (two) times daily.  . hydrocortisone 1 % lotion Apply 1 application topically as needed for itching.  Marland Kitchen ketoconazole (NIZORAL) 2 % shampoo Apply 1 application topically once a week. On Tues.  Marland Kitchen levothyroxine (SYNTHROID,  LEVOTHROID) 50 MCG tablet Take 50 mcg by mouth daily before breakfast.   . Lidocaine (ASPERCREME LIDOCAINE) 4 % PTCH Apply 1 patch topically See admin instructions. 1 patch applied once a day to mid lower back  . losartan (COZAAR) 50 MG tablet Take 50 mg by mouth 2 (two) times daily. Hold if Systolic B/P <110 and check Blood Pressure and Heart Rate before giving Losartan; Also assess and document pain level at time of blood pressure check  . magic mouthwash w/lidocaine SOLN Take 5-10 mLs by mouth every 4 (four) hours as needed for mouth pain.  . magnesium citrate SOLN Take by mouth as needed for severe constipation. Per ED physician resident may take 10 oz by mouth followed by 8 oz of water as needed every 4th day if no bowel movement in 3 days  . Misc. Devices (SLEEPRIGHT BREATHE AID) MISC 1 strip at bedtime.  . Nutritional Supplements (FEEDING SUPPLEMENT, BOOST BREEZE,) LIQD Take 1 Can by mouth 2 (two) times daily between meals.  Marland Kitchen  omeprazole (PRILOSEC OTC) 20 MG tablet Take 20 mg by mouth daily.  Marland Kitchen oxyCODONE (OXYCONTIN) 10 mg 12 hr tablet Take 1 tablet (10 mg total) by mouth daily.  . polyethylene glycol (MIRALAX / GLYCOLAX) packet Take 17 g by mouth 2 (two) times daily.   . sennosides-docusate sodium (SENOKOT-S) 8.6-50 MG tablet Take 2 tablets by mouth at bedtime.  . sodium chloride (OCEAN) 0.65 % SOLN nasal spray Place 1 spray into both nostrils 3 (three) times daily as needed for congestion.  . triamcinolone (KENALOG) 0.1 % paste Use as directed 1 application in the mouth or throat every 4 (four) hours as needed.   No facility-administered encounter medications on file as of 03/30/2018.     Review of Systems  Constitutional: Negative for activity change, appetite change, chills, diaphoresis, fatigue, fever and unexpected weight change.  HENT: Positive for hearing loss. Negative for congestion, sinus pressure, sinus pain and voice change.   Eyes: Positive for visual disturbance.    Respiratory: Negative for cough, shortness of breath and wheezing.   Cardiovascular: Negative for chest pain, palpitations and leg swelling.  Gastrointestinal: Negative for abdominal distention, abdominal pain, constipation, diarrhea, nausea and vomiting.  Genitourinary: Negative for difficulty urinating, dysuria and urgency.  Musculoskeletal: Positive for arthralgias and gait problem.  Skin: Negative for color change and pallor.  Neurological: Negative for dizziness, speech difficulty, weakness and headaches.       Memory lapses.   Psychiatric/Behavioral: Negative for agitation, behavioral problems, hallucinations and sleep disturbance. The patient is not nervous/anxious.     Immunization History  Administered Date(s) Administered  . Influenza-Unspecified 12/27/2010, 12/29/2011, 12/27/2012, 12/13/2013, 12/14/2014, 12/20/2015, 12/17/2016, 12/14/2017  . PPD Test 07/30/2010, 08/14/2010  . Pneumococcal Conjugate-13 11/04/2016  . Pneumococcal Polysaccharide-23 03/10/2005  . Td 03/11/1999  . Tdap 10/31/2016   Pertinent  Health Maintenance Due  Topic Date Due  . INFLUENZA VACCINE  Completed  . PNA vac Low Risk Adult  Completed   Fall Risk  11/04/2017 10/29/2016 08/07/2015 08/07/2015 04/10/2015  Falls in the past year? No Yes Yes No No  Comment - - 06/02/15- skin tear right arm - -  Number falls in past yr: - 1 - - -  Injury with Fall? - No - - -   Functional Status Survey:    Vitals:   03/30/18 1246  BP: (!) 160/95  Pulse: 61  Resp: 18  Temp: 97.6 F (36.4 C)  SpO2: 98%  Weight: 142 lb (64.4 kg)  Height: 5\' 9"  (1.753 m)   Body mass index is 20.97 kg/m. Physical Exam Constitutional:      General: He is not in acute distress.    Appearance: He is normal weight. He is not ill-appearing, toxic-appearing or diaphoretic.  HENT:     Head: Normocephalic and atraumatic.     Nose: Nose normal.     Mouth/Throat:     Mouth: Mucous membranes are moist.     Pharynx: No oropharyngeal  exudate or posterior oropharyngeal erythema.  Eyes:     Extraocular Movements: Extraocular movements intact.     Pupils: Pupils are equal, round, and reactive to light.  Neck:     Musculoskeletal: Normal range of motion and neck supple.  Cardiovascular:     Rate and Rhythm: Normal rate and regular rhythm.     Heart sounds: Murmur present.  Pulmonary:     Effort: Pulmonary effort is normal.     Breath sounds: Rales present. No wheezing.     Comments: Posterior bibasilar  rales.  Abdominal:     General: There is no distension.     Palpations: Abdomen is soft.     Tenderness: There is no abdominal tenderness. There is no guarding or rebound.  Musculoskeletal:     Right lower leg: Edema present.     Left lower leg: Edema present.     Comments: Self transfer. Ambulates with walker. W/c to go further. Trace edema BLE  Skin:    General: Skin is warm and dry.     Coloration: Skin is not pale.     Findings: No erythema or rash.  Neurological:     General: No focal deficit present.     Mental Status: He is alert. Mental status is at baseline.     Cranial Nerves: No cranial nerve deficit.     Motor: No weakness.     Coordination: Coordination normal.     Gait: Gait abnormal.     Comments: Oriented to person and place.   Psychiatric:        Mood and Affect: Mood normal.        Behavior: Behavior normal.        Thought Content: Thought content normal.     Labs reviewed: Recent Labs    04/24/17 0649 04/25/17 0410 05/06/17 2226 06/04/17 09/21/17 09/26/17 1018 01/28/18 01/31/18 02/02/18  NA 136 135 136 140  140 135* 139 139 135* 138  K 4.7 4.1 4.8 4.7  4.7 5.0 5.0 5.5* 5.4* 5.0  CL 103 103 105 108 106 111  --   --   --   CO2 24 24 22 24 20 22   --   --   --   GLUCOSE 106* 95 93  --   --  114*  --   --   --   BUN 23* 20 27* 33* 34* 54* 36* 38* 34*  CREATININE 1.13 1.10 1.08 1.1  1.11 1.3 1.47* 1.3 1.3 1.0  CALCIUM 8.7* 8.4* 8.5* 9.3  9.3 8.6 8.5*  --   --   --   MG 2.2  --    --   --   --   --   --   --   --   PHOS 3.5  --   --   --   --   --   --   --   --    Recent Labs    04/23/17 1231 05/06/17 2226 09/21/17 09/26/17 1018  AST 23 19 16 19   ALT 11* 13* 9* 11  ALKPHOS 105 96 110 104  BILITOT 1.1 0.6  --  0.4  PROT 7.0 6.3* 6.0 6.3*  ALBUMIN 3.8 3.4* 3.7 3.5   Recent Labs    04/25/17 0410 05/06/17 2226  09/21/17 09/26/17 1018 01/28/18 01/31/18  WBC 6.7 5.8   < > 9.1 6.9 6.4 7.1  NEUTROABS  --  3.0  --  6,552 4.4  --   --   HGB 10.6* 9.6*   < > 9.3* 9.4* 9.9* 9.8*  HCT 32.1* 29.3*   < > 27* 29.2* 30* 30*  MCV 89.4 88.8  --   --  93.9  --   --   PLT 154 208  --  161 167 191 198   < > = values in this interval not displayed.   Lab Results  Component Value Date   TSH 2.84 09/21/2017   Lab Results  Component Value Date   HGBA1C 5.6 04/06/2017   Lab Results  Component Value Date   CHOL 145 04/06/2017   HDL 47 04/06/2017   LDLCALC 89 04/06/2017   TRIG 47 04/06/2017   CHOLHDL 3.1 04/06/2017    Significant Diagnostic Results in last 30 days:  No results found.  Assessment/Plan Essential hypertension HTN, elevated BP, continue Losartan 50mg  bid, Hydralazine 25mg  bid, Clonidine 0.1mg  bid prn. He denied headache, change of vision, dizziness, chest pain/pressure, SOB, or palpitation.   GERD GERD stable, continue Omeprazole 20mg  qd  Hypothyroidism Hypothyroidism, continue  Levothyroxine 50mcg qd, last TSH 2.84 09/21/17  BPH (benign prostatic hyperplasia) BHP no urinary retention, continue Finasteride 5mg  qd.   Constipation No constipation, continue  Dulcolax 10mg  suppository qod, MiraLax bid, Senokot S II qd.   Neuropathic pain Peripheral neuropathy, pain is managed, continue Tylenol 1000mg  bid, Oxycodone 10mg  qd Gabapentin 100mg  qd. .   Iron deficiency anemia Stable, last Hgb 9.8 01/31/18.   Chronic kidney disease, stage III (moderate) Stable, last Na 138, K 5.0, Bun 34, creat 1.0     Family/ staff Communication: plan of care  reviewed with the patient and charge nurse.   Labs/tests ordered:  none  Time spend 25 minutes.

## 2018-04-06 ENCOUNTER — Other Ambulatory Visit: Payer: Self-pay | Admitting: *Deleted

## 2018-04-06 DIAGNOSIS — M792 Neuralgia and neuritis, unspecified: Secondary | ICD-10-CM

## 2018-04-06 MED ORDER — OXYCODONE HCL ER 10 MG PO T12A: 10.0000 mg | EXTENDED_RELEASE_TABLET | Freq: Every day | ORAL | 0 refills | Status: DC

## 2018-05-06 ENCOUNTER — Other Ambulatory Visit: Payer: Self-pay

## 2018-05-06 DIAGNOSIS — M792 Neuralgia and neuritis, unspecified: Secondary | ICD-10-CM

## 2018-05-06 MED ORDER — OXYCODONE HCL ER 10 MG PO T12A: 10.0000 mg | EXTENDED_RELEASE_TABLET | Freq: Every day | ORAL | 0 refills | Status: DC

## 2018-06-04 ENCOUNTER — Other Ambulatory Visit: Payer: Self-pay | Admitting: *Deleted

## 2018-06-04 DIAGNOSIS — M792 Neuralgia and neuritis, unspecified: Secondary | ICD-10-CM

## 2018-06-04 MED ORDER — OXYCODONE HCL ER 10 MG PO T12A: 10.0000 mg | EXTENDED_RELEASE_TABLET | Freq: Every day | ORAL | 0 refills | Status: DC

## 2018-06-29 ENCOUNTER — Encounter: Payer: Self-pay | Admitting: Internal Medicine

## 2018-06-29 NOTE — Progress Notes (Signed)
Location:  [facility] Room Number: 32 Place of Service:  ALF [phone]) Provider:  Einar CrowGupta, Anjali  MD  Mahlon GammonGupta, Anjali L, MD  Patient Care Team: Mahlon GammonGupta, Anjali L, MD as PCP - General (Internal Medicine) Mast, Man X, NP as Nurse Practitioner (Internal Medicine)  Extended Emergency Contact Information Primary Emergency Contact: [name],Phillip          [place], [place] Home Phone: [phone] Mobile Phone: [phone] Relation: Son Secondary Emergency Contact: [name],[name]  [place] Mobile Phone: [phone] Relation: Grandson  Code Status:  DNR Goals of care: Advanced Directive information Advanced Directives 06/29/2018  Does Patient Have a Medical Advance Directive? Yes  Type of Estate agentAdvance Directive Healthcare Power of [place];Out of facility DNR (pink MOST or yellow form)  Does patient want to make changes to medical advance directive? No - Patient declined  Copy of Healthcare Power of Attorney in Chart? Yes - validated most recent copy scanned in chart (See row information)  Would patient like information on creating a medical advance directive? -  Pre-existing out of facility DNR order (yellow form or pink MOST form) Yellow form placed in chart (order not valid for inpatient use);Pink MOST form placed in chart (order not valid for inpatient use)     Chief Complaint  Patient presents with  . Medical Management of Chronic Issues    HPI:  Pt is a [age] y.o. male seen today for medical management of chronic diseases.     Past Medical History:  Diagnosis Date  . AAA (abdominal aortic aneurysm) (HCC)   . Abnormality of gait 08/01/2010  . Adhesive capsulitis of shoulder 08/01/2010   "Frozen" right shoulder  . Anemia   . Anxiety state, unspecified 08/01/2010  . Arthritis   . Congestive heart failure, unspecified 08/01/2010  . Cough 08/07/2015  . Disorders of bursae and tendons in shoulder region, unspecified 08/01/2010  .  Edema 08/01/2010  . Fall   . Fecal impaction (HCC)   . GERD (gastroesophageal reflux disease)   . Hiatal hernia   . Hypercalcemia   . Hyperlipidemia   . Hypertension   . Hypertrophy of prostate without urinary obstruction and other lower urinary tract symptoms (LUTS) 08/01/2010  . Hyponatremia   . Hypothyroidism   . Infectious colitis   . Insomnia, unspecified 08/01/2010  . Joint pain   . Leg pain    with walking  . Lumbago 12/24/2010  . Orthostatic hypotension 11/05/2010  . Other specified cardiac dysrhythmias(427.89) 12/24/2010  . Pain in joint, pelvic region and thigh 08/06/2010  . Peptic ulcer, unspecified site, unspecified as acute or chronic, without mention of hemorrhage, perforation, or obstruction 08/01/2010  . Renal failure, acute (HCC)   . Swelling, mass, or lump in head and neck 01/03/2011  . Ulcer    Stomach  . Unspecified disorder of kidney and ureter 08/01/2010  . Unspecified hearing loss 08/01/2010  . Unspecified vitamin D deficiency 08/06/2010  . Urinary frequency 09/13/2010  . Vertigo    Past Surgical History:  Procedure Laterality Date  . ABDOMINAL AORTIC ANEURYSM REPAIR  12-18-10   Stent graft repair of AAA  . APPENDECTOMY    . CATARACT EXTRACTION    . HEMORRHOID SURGERY  2000  . INGUINAL HERNIA REPAIR Left 2005  . TRANSTHORACIC ECHOCARDIOGRAM  07/19/2010    Left ventricle: There is hypokinesis of the inferior wall and  posterior wall. The EF is 35-40%. The cavity size was normal    Allergies  Allergen Reactions  . Aleve [Naproxen] Other (See Comments)    Per MAR  . Indocin [Indomethacin] Other (See Comments)    [facility]    Outpatient Encounter Medications as of 06/29/2018  Medication Sig  . acetaminophen (TYLENOL) 500 MG tablet Take 1,000 mg by mouth 2 (two) times daily.   Marland Kitchen acetaminophen (TYLENOL) 500 MG tablet Take 500 mg by mouth every 8 (eight) hours as needed.  Marland Kitchen alum & mag hydroxide-simeth (MINTOX) 200-200-20 MG/5ML suspension Take 30 mLs by mouth  every 4 (four) hours as needed for indigestion or heartburn (for 48 hours and call MD if symptoms persist).   . bisacodyl (DULCOLAX) 10 MG suppository Place 10 mg rectally every other day. For constipation Hold for loose stools  . Cholecalciferol 25 MCG (1000 UT) tablet Take 2,000 Units by mouth daily.   . cloNIDine (CATAPRES) 0.1 MG tablet Take 0.1 mg by mouth 2 (two) times daily as needed.  Marland Kitchen DEXTRAN 70-HYPROMELLOSE, PF, OP Place 1 drop into both eyes as needed (for dry eyes). wait 3-5 minutes between eye drops  . Dextromethorphan-Guaifenesin (TUSSIN COUGH DM PO) Take 5 mLs by mouth every 4 (four) hours as needed.  . finasteride (PROSCAR) 5 MG tablet Take 5 mg by mouth daily.   . fluocinonide (LIDEX) 0.05 % external solution Apply 1 application topically as needed. apply to scalp for itching  . fluticasone (FLONASE) 50 MCG/ACT nasal spray Place 2 sprays into both nostrils daily.  Marland Kitchen gabapentin (NEURONTIN) 100 MG capsule Take 100 mg by mouth daily with supper.   . hydrALAZINE (APRESOLINE) 25 MG tablet Take 25 mg by mouth 2 (two) times daily.  . hydrocortisone 1 % lotion Apply 1 application topically 2 (two) times daily as needed for itching.   Marland Kitchen ketoconazole (NIZORAL) 2 % shampoo Apply 1 application topically once a week. On Tues.  Marland Kitchen levothyroxine (SYNTHROID, LEVOTHROID) 50 MCG tablet Take 50 mcg by mouth daily before breakfast.   . Lidocaine (ASPERCREME LIDOCAINE) 4 % PTCH Apply 1 patch topically See admin instructions. 1 patch applied once a day to mid lower back  . losartan (COZAAR) 50 MG tablet Take 50 mg by mouth 2 (two) times daily. Hold if Systolic B/P <110 and check Blood Pressure and Heart Rate before giving Losartan; Also assess and document pain level at time of blood pressure check  . magic mouthwash w/lidocaine SOLN Take 5-10 mLs by mouth every 4 (four) hours as needed for mouth pain.  . magnesium citrate SOLN Take by mouth as needed for severe constipation. Per ED physician resident  may take 10 oz by mouth followed by 8 oz of water as needed every 4th day if no bowel movement in 3 days  . Misc. Devices (SLEEPRIGHT BREATHE AID) MISC 1 strip at bedtime.  Marland Kitchen omeprazole (PRILOSEC OTC) 20 MG tablet Take 20 mg by mouth daily.  Marland Kitchen oxyCODONE (OXYCONTIN) 10 mg 12 hr tablet Take 1 tablet (10 mg total) by mouth daily.  . polyethylene glycol (MIRALAX / GLYCOLAX) packet Take 17 g by mouth 2 (two) times daily.   . sennosides-docusate sodium (SENOKOT-S) 8.6-50 MG tablet Take 2 tablets by mouth at bedtime.  . sodium chloride (OCEAN) 0.65 % SOLN nasal spray Place 1 spray into both nostrils 3 (three) times daily as needed for congestion.  . triamcinolone (KENALOG) 0.1 % paste Use as directed 1 application in the mouth or throat every 4 (four) hours as needed.  . [DISCONTINUED] benzocaine (ORAJEL) 10 % mucosal gel Use as  directed 1 application in the mouth or throat as needed for mouth pain.  . [DISCONTINUED] Nutritional Supplements (FEEDING SUPPLEMENT, BOOST BREEZE,) LIQD Take 1 Can by mouth 2 (two) times daily between meals.   No facility-administered encounter medications on file as of 06/29/2018.     Review of Systems  Immunization History  Administered Date(s) Administered  . Influenza-Unspecified 12/27/2010, 12/29/2011, 12/27/2012, 12/13/2013, 12/14/2014, 12/20/2015, 12/17/2016, 12/14/2017  . PPD Test 07/30/2010, 08/14/2010  . Pneumococcal Conjugate-13 11/04/2016  . Pneumococcal Polysaccharide-23 03/10/2005  . Td 03/11/1999  . Tdap 10/31/2016   Pertinent  Health Maintenance Due  Topic Date Due  . INFLUENZA VACCINE  10/09/2018  . PNA vac Low Risk Adult  Completed   Fall Risk  11/04/2017 10/29/2016 08/07/2015 08/07/2015 04/10/2015  Falls in the past year? No Yes Yes No No  Comment - - 06/02/15- skin tear right arm - -  Number falls in past yr: - 1 - - -  Injury with Fall? - No - - -   Functional Status Survey:    Vitals:   06/29/18 0930  BP: 136/81  Pulse: 72  Resp: 20   Temp: (!) 96.8 F (36 C)  SpO2: 96%  Weight: 134 lb 12.8 oz (61.1 kg)  Height:  (1.753 m)   Body mass index is 19.91 kg/m. Physical Exam  Labs reviewed: Recent Labs    09/21/17 09/26/17 1018 01/28/18 01/31/18 02/02/18  NA 135* 139 139 135* 138  K 5.0 5.0 5.5* 5.4* 5.0  CL 106 111  --   --   --   CO2 20 22  --   --   --   GLUCOSE  --  114*  --   --   --   BUN 34* 54* 36* 38* 34*  CREATININE 1.3 1.47* 1.3 1.3 1.0  CALCIUM 8.6 8.5*  --   --   --    Recent Labs    09/21/17 09/26/17 1018  AST 16 19  ALT 9* 11  ALKPHOS 110 104  BILITOT  --  0.4  PROT 6.0 6.3*  ALBUMIN 3.7 3.5   Recent Labs    09/21/17 09/26/17 1018 01/28/18 01/31/18  WBC 9.1 6.9 6.4 7.1  NEUTROABS 6,552 4.4  --   --   HGB 9.3* 9.4* 9.9* 9.8*  HCT 27* 29.2* 30* 30*  MCV  --  93.9  --   --   PLT 161 167 191 198   Lab Results  Component Value Date   TSH 2.84 09/21/2017   Lab Results  Component Value Date   HGBA1C 5.6 04/06/2017   Lab Results  Component Value Date   CHOL 145 04/06/2017   HDL 47 04/06/2017   LDLCALC 89 04/06/2017   TRIG 47 04/06/2017   CHOLHDL 3.1 04/06/2017    Significant Diagnostic Results in last 30 days:  No results found.  Assessment/Plan There are no diagnoses linked to this encounter.   Family/ staff Communication:   Labs/tests ordered:     This encounter was created in error - please disregard.

## 2018-07-05 ENCOUNTER — Other Ambulatory Visit: Payer: Self-pay | Admitting: *Deleted

## 2018-07-05 DIAGNOSIS — M792 Neuralgia and neuritis, unspecified: Secondary | ICD-10-CM

## 2018-07-05 MED ORDER — OXYCODONE HCL ER 10 MG PO T12A: 10.0000 mg | EXTENDED_RELEASE_TABLET | Freq: Every day | ORAL | 0 refills | Status: DC

## 2018-07-07 ENCOUNTER — Non-Acute Institutional Stay (SKILLED_NURSING_FACILITY): Payer: Medicare PPO | Admitting: Internal Medicine

## 2018-07-07 ENCOUNTER — Encounter: Payer: Self-pay | Admitting: Internal Medicine

## 2018-07-07 DIAGNOSIS — N183 Chronic kidney disease, stage 3 unspecified: Secondary | ICD-10-CM

## 2018-07-07 DIAGNOSIS — R3911 Hesitancy of micturition: Secondary | ICD-10-CM

## 2018-07-07 DIAGNOSIS — N401 Enlarged prostate with lower urinary tract symptoms: Secondary | ICD-10-CM

## 2018-07-07 DIAGNOSIS — I714 Abdominal aortic aneurysm, without rupture, unspecified: Secondary | ICD-10-CM

## 2018-07-07 DIAGNOSIS — E039 Hypothyroidism, unspecified: Secondary | ICD-10-CM

## 2018-07-07 DIAGNOSIS — I10 Essential (primary) hypertension: Secondary | ICD-10-CM

## 2018-07-07 DIAGNOSIS — R001 Bradycardia, unspecified: Secondary | ICD-10-CM

## 2018-07-07 DIAGNOSIS — K219 Gastro-esophageal reflux disease without esophagitis: Secondary | ICD-10-CM

## 2018-07-07 DIAGNOSIS — M792 Neuralgia and neuritis, unspecified: Secondary | ICD-10-CM

## 2018-07-07 NOTE — Progress Notes (Signed)
Location:  [facility] Room Number: 32 Place of Service:  ALF (13) Provider:Xenia Nile L.MD   [name], MD  Patient Care Team: [name], MD as PCP - General (Internal Medicine) Mast, Man X, NP as Nurse Practitioner (Internal Medicine)  Extended Emergency Contact Information Primary Emergency Contact: [name], [place] Home Phone: [phone] Mobile Phone: [phone] Relation: Son Secondary Emergency Contact: [name] [place] Mobile Phone: [phone] Relation: Grandson  Code Status:DNR  Goals of care: Advanced Directive information Advanced Directives 07/07/2018  Does Patient Have a Medical Advance Directive? Yes  Type of Advance Directive Living will;Healthcare Power of [place];Out of facility DNR (pink MOST or yellow form)  Does patient want to make changes to medical advance directive? No - Patient declined  Copy of Healthcare Power of Attorney in Chart? Yes - validated most recent copy scanned in chart (See row information)  Would patient like information on creating a medical advance directive? -  Pre-existing out of facility DNR order (yellow form or pink MOST form) Yellow form placed in chart (order not valid for inpatient use);Pink MOST form placed in chart (order not valid for inpatient use)     Chief Complaint  Patient presents with  . Medical Management of Chronic Issues    routine visit     HPI:  Pt is a 85 y.o. male seen today for medical management of chronic diseases.   Patient is a resident in [facility] He has a history of AAA with measurement of 9.3 cm in CT scan in 07/ 19, hypertension, peripheral neuropathy, BPH, hypothyroid, GERD Patient is very hard of hearing. He is still independent in his ADLs.  He is able to do his transfers to his power wheelchair.  Did not have any acute complaints today. No complaint of chest pain ,cough or  fever  Past Medical History:  Diagnosis Date  . AAA (abdominal aortic aneurysm) (HCC)   . Abnormality of gait 08/01/2010  . Adhesive capsulitis of shoulder 08/01/2010   "Frozen" right shoulder  . Anemia   . Anxiety state, unspecified 08/01/2010  . Arthritis   . Congestive heart failure, unspecified 08/01/2010  . Cough 08/07/2015  . Disorders of bursae and tendons in shoulder region, unspecified 08/01/2010  . Edema 08/01/2010  . Fall   . Fecal impaction (HCC)   . GERD (gastroesophageal reflux disease)   . Hiatal hernia   . Hypercalcemia   . Hyperlipidemia   . Hypertension   . Hypertrophy of prostate without urinary obstruction and other lower urinary tract symptoms (LUTS) 08/01/2010  . Hyponatremia   . Hypothyroidism   . Infectious colitis   . Insomnia, unspecified 08/01/2010  . Joint pain   . Leg pain    with walking  . Lumbago 12/24/2010  . Orthostatic hypotension 11/05/2010  . Other specified cardiac dysrhythmias(427.89) 12/24/2010  . Pain in joint, pelvic region and thigh 08/06/2010  . Peptic ulcer, unspecified site, unspecified as acute or chronic, without mention of hemorrhage, perforation, or obstruction 08/01/2010  . Renal failure, acute (HCC)   . Swelling, mass, or lump in head and neck 01/03/2011  . Ulcer    Stomach  . Unspecified disorder of kidney and ureter 08/01/2010  . Unspecified hearing loss 08/01/2010  . Unspecified vitamin D deficiency 08/06/2010  . Urinary frequency 09/13/2010  . Vertigo    Past Surgical History:  Procedure Laterality Date  .  ABDOMINAL AORTIC ANEURYSM REPAIR  12-18-10   Stent graft repair of AAA  . APPENDECTOMY    . CATARACT EXTRACTION    . HEMORRHOID SURGERY  2000  . INGUINAL HERNIA REPAIR Left 2005  . TRANSTHORACIC ECHOCARDIOGRAM  07/19/2010    Left ventricle: There is hypokinesis of the inferior wall and  posterior wall. The EF is 35-40%. The cavity size was normal    Allergies  Allergen Reactions  . Aleve [Naproxen] Other (See Comments)     Per MAR  . Indocin [Indomethacin] Other (See Comments)    Per [facility]    Outpatient Encounter Medications as of 07/07/2018  Medication Sig  . acetaminophen (TYLENOL) 500 MG tablet Take 1,000 mg by mouth 2 (two) times daily.   Marland Kitchen acetaminophen (TYLENOL) 500 MG tablet Take 500 mg by mouth every 8 (eight) hours as needed.  . bisacodyl (DULCOLAX) 10 MG suppository Place 10 mg rectally every other day. For constipation Hold for loose stools  . Cholecalciferol 25 MCG (1000 UT) tablet Take 2,000 Units by mouth daily.   . cloNIDine (CATAPRES) 0.1 MG tablet Take 0.1 mg by mouth 2 (two) times daily as needed.  Marland Kitchen DEXTRAN 70-HYPROMELLOSE, PF, OP Place 1 drop into both eyes as needed (for dry eyes). wait 3-5 minutes between eye drops  . Dextromethorphan-Guaifenesin (TUSSIN COUGH DM PO) Take 5 mLs by mouth every 4 (four) hours as needed.  . finasteride (PROSCAR) 5 MG tablet Take 5 mg by mouth daily.   . fluocinonide (LIDEX) 0.05 % external solution Apply 1 application topically as needed. apply to scalp for itching  . fluticasone (FLONASE) 50 MCG/ACT nasal spray Place 2 sprays into both nostrils daily.  Marland Kitchen gabapentin (NEURONTIN) 100 MG capsule Take 100 mg by mouth daily with supper.   . hydrALAZINE (APRESOLINE) 25 MG tablet Take 25 mg by mouth 2 (two) times daily.  . hydrocortisone 1 % lotion Apply 1 application topically 2 (two) times daily as needed for itching.   Marland Kitchen ketoconazole (NIZORAL) 2 % shampoo Apply 1 application topically once a week. On Tues.  Marland Kitchen levothyroxine (SYNTHROID, LEVOTHROID) 50 MCG tablet Take 50 mcg by mouth daily before breakfast.   . Lidocaine (ASPERCREME LIDOCAINE) 4 % PTCH Apply 1 patch topically See admin instructions. 1 patch applied once a day to mid lower back  . losartan (COZAAR) 50 MG tablet Take 50 mg by mouth 2 (two) times daily. Hold if Systolic B/P <110 and check Blood Pressure and Heart Rate before giving Losartan; Also assess and document pain level at time of blood pressure  check  . magic mouthwash w/lidocaine SOLN Take 5-10 mLs by mouth every 4 (four) hours as needed for mouth pain.  . magnesium citrate SOLN Take by mouth as needed for severe constipation. Per ED physician resident may take 10 oz by mouth followed by 8 oz of water as needed every 4th day if no bowel movement in 3 days  . Misc. Devices (SLEEPRIGHT BREATHE AID) MISC 1 strip at bedtime.  Marland Kitchen omeprazole (PRILOSEC OTC) 20 MG tablet Take 20 mg by mouth daily.  Marland Kitchen oxyCODONE (OXYCONTIN) 10 mg 12 hr tablet Take 1 tablet (10 mg total) by mouth daily.  . polyethylene glycol (MIRALAX / GLYCOLAX) packet Take 17 g by mouth 2 (two) times daily.   . sennosides-docusate sodium (SENOKOT-S) 8.6-50 MG tablet Take 2 tablets by mouth at bedtime.  . sodium chloride (OCEAN) 0.65 % SOLN nasal spray Place 1 spray into both nostrils 3 (three) times daily  as needed for congestion.  . triamcinolone (KENALOG) 0.1 % paste Use as directed 1 application in the mouth or throat every 4 (four) hours as needed.  . [DISCONTINUED] alum & mag hydroxide-simeth (MINTOX) 200-200-20 MG/5ML suspension Take 30 mLs by mouth every 4 (four) hours as needed for indigestion or heartburn (for 48 hours and call MD if symptoms persist).    No facility-administered encounter medications on file as of 07/07/2018.     Review of Systems  Review of Systems  Constitutional: Negative for activity change, appetite change, chills, diaphoresis, fatigue and fever.  HENT: Negative for mouth sores, postnasal drip, rhinorrhea, sinus pain and sore throat.  Very Hard of Hearing Respiratory: Negative for apnea, cough, chest tightness, shortness of breath and wheezing.   Cardiovascular: Negative for chest pain, palpitations and leg swelling.  Gastrointestinal: Negative for abdominal distention, abdominal pain, constipation, diarrhea, nausea and vomiting.  Genitourinary: Negative for dysuria and frequency.  Musculoskeletal: Negative for arthralgias, joint swelling and  myalgias.  Skin: Negative for rash.  Neurological: Negative for dizziness, syncope, weakness, light-headedness and numbness.  Psychiatric/Behavioral: Negative for behavioral problems, confusion and sleep disturbance.     Immunization History  Administered Date(s) Administered  . Influenza-Unspecified 12/27/2010, 12/29/2011, 12/27/2012, 12/13/2013, 12/14/2014, 12/20/2015, 12/17/2016, 12/14/2017  . PPD Test 07/30/2010, 08/14/2010  . Pneumococcal Conjugate-13 11/04/2016  . Pneumococcal Polysaccharide-23 03/10/2005  . Td 03/11/1999  . Tdap 10/31/2016   Pertinent  Health Maintenance Due  Topic Date Due  . INFLUENZA VACCINE  10/09/2018  . PNA vac Low Risk Adult  Completed   Fall Risk  11/04/2017 10/29/2016 08/07/2015 08/07/2015 04/10/2015  Falls in the past year? No Yes Yes No No  Comment - - 06/02/15- skin tear right arm - -  Number falls in past yr: - 1 - - -  Injury with Fall? - No - - -   Functional Status Survey:    Vitals:   07/07/18 0848  BP: (!) 124/57  Pulse: (!) 47  Resp: 20  Temp: (!) 97.4 F (36.3 C)  SpO2: 95%  Weight: 134 lb 12.8 oz (61.1 kg)  Height: 5\' 9"  (1.753 m)   Body mass index is 19.91 kg/m. Physical Exam Vitals signs reviewed.  Constitutional:      Appearance: Normal appearance.  HENT:     Head: Normocephalic.     Nose: Nose normal.     Mouth/Throat:     Mouth: Mucous membranes are moist.     Pharynx: Oropharynx is clear.  Eyes:     Pupils: Pupils are equal, round, and reactive to light.  Neck:     Musculoskeletal: Neck supple.  Cardiovascular:     Rate and Rhythm: Normal rate and regular rhythm.     Pulses: Normal pulses.     Heart sounds: Normal heart sounds.  Pulmonary:     Effort: Pulmonary effort is normal.     Breath sounds: Normal breath sounds.  Abdominal:     General: Abdomen is flat. Bowel sounds are normal.     Palpations: Abdomen is soft.  Musculoskeletal:     Comments: Mild swelling Bilateral  Skin:    General: Skin is warm  and dry.  Neurological:     General: No focal deficit present.     Mental Status: He is alert and oriented to person, place, and time.  Psychiatric:        Mood and Affect: Mood normal.        Thought Content: Thought content normal.  Judgment: Judgment normal.     Labs reviewed: Recent Labs    09/21/17 09/26/17 1018 01/28/18 01/31/18 02/02/18  NA 135* 139 139 135* 138  K 5.0 5.0 5.5* 5.4* 5.0  CL 106 111  --   --   --   CO2 20 22  --   --   --   GLUCOSE  --  114*  --   --   --   BUN 34* 54* 36* 38* 34*  CREATININE 1.3 1.47* 1.3 1.3 1.0  CALCIUM 8.6 8.5*  --   --   --    Recent Labs    09/21/17 09/26/17 1018  AST 16 19  ALT 9* 11  ALKPHOS 110 104  BILITOT  --  0.4  PROT 6.0 6.3*  ALBUMIN 3.7 3.5   Recent Labs    09/21/17 09/26/17 1018 01/28/18 01/31/18  WBC 9.1 6.9 6.4 7.1  NEUTROABS 6,552 4.4  --   --   HGB 9.3* 9.4* 9.9* 9.8*  HCT 27* 29.2* 30* 30*  MCV  --  93.9  --   --   PLT 161 167 191 198   Lab Results  Component Value Date   TSH 2.84 09/21/2017   Lab Results  Component Value Date   HGBA1C 5.6 04/06/2017   Lab Results  Component Value Date   CHOL 145 04/06/2017   HDL 47 04/06/2017   LDLCALC 89 04/06/2017   TRIG 47 04/06/2017   CHOLHDL 3.1 04/06/2017    Significant Diagnostic Results in last 30 days:  No results found.  Assessment/Plan Abdominal aortic aneurysm (AAA) without rupture (HCC) Last Measurment was 9.2 cm Not surgical candidate No Issues right now Essential hypertension Since patient has hyperkalemia He needs to be taken off Angiotensin 2 Will decrase the dose to 25 mg Repeat BMP Will monitor BP closely SBP needs to be less then 140  Hypothyroidism  Repeat TSH  Chronic kidney disease, stage III (moderate) with Hyperkalemia Will repeat Bmp and take him slowly off ARB Benign prostatic hyperplasia with urinary hesitancy Stable on Proscar Neuropathic pain Stable on Neurontin  Bradycardia by electrocardiogram  Will get EKG to rule out AV blck Patient not candidate for any intervention He is asympatomatic  GERD On Prilosec     Family/ staff Communication:   Labs/tests ordered:  BMP,and TSH Total time spent in this patient care encounter was  25_  minutes; greater than 50% of the visit spent counseling patient and staff, reviewing records , Labs and coordinating care for problems addressed at this encounter.

## 2018-07-08 LAB — BASIC METABOLIC PANEL
BUN: 52 — AB (ref 4–21)
Creatinine: 1.3 (ref 0.6–1.3)
Glucose: 79
Potassium: 5.6 — AB (ref 3.4–5.3)
Sodium: 135 — AB (ref 137–147)

## 2018-07-08 LAB — CBC AND DIFFERENTIAL
HCT: 90 — AB (ref 41–53)
Hemoglobin: 30.1 — AB (ref 13.5–17.5)
Platelets: 185 (ref 150–399)
WBC: 6.5

## 2018-07-22 LAB — BASIC METABOLIC PANEL
BUN: 50 — AB (ref 4–21)
Creatinine: 1.3 (ref 0.6–1.3)
Glucose: 91
Potassium: 5 (ref 3.4–5.3)
Sodium: 135 — AB (ref 137–147)

## 2018-08-05 ENCOUNTER — Other Ambulatory Visit: Payer: Self-pay

## 2018-08-05 DIAGNOSIS — M792 Neuralgia and neuritis, unspecified: Secondary | ICD-10-CM

## 2018-08-05 MED ORDER — OXYCODONE HCL ER 10 MG PO T12A: 10.0000 mg | EXTENDED_RELEASE_TABLET | Freq: Every day | ORAL | 0 refills | Status: DC

## 2018-08-05 NOTE — Telephone Encounter (Signed)
Patient is located in assisted living at [facility] and requesting refill for controlled medication. Medication has been routed to provider for approval.

## 2018-08-19 LAB — BASIC METABOLIC PANEL
BUN: 54 — AB (ref 4–21)
Creatinine: 1.3 (ref 0.6–1.3)
Glucose: 90
Potassium: 5 (ref 3.4–5.3)
Sodium: 136 — AB (ref 137–147)

## 2018-09-03 ENCOUNTER — Other Ambulatory Visit: Payer: Self-pay | Admitting: *Deleted

## 2018-09-03 DIAGNOSIS — M792 Neuralgia and neuritis, unspecified: Secondary | ICD-10-CM

## 2018-09-03 MED ORDER — OXYCODONE HCL ER 10 MG PO T12A: 10.0000 mg | EXTENDED_RELEASE_TABLET | Freq: Every day | ORAL | 0 refills | Status: DC

## 2018-09-03 NOTE — Telephone Encounter (Signed)
[facility] Requested Pended Rx and sent to Dr. Lyndel Safe for approval.

## 2018-09-06 LAB — NOVEL CORONAVIRUS, NAA: SARS-CoV-2, NAA: NOT DETECTED

## 2018-09-30 LAB — BASIC METABOLIC PANEL
BUN: 50 — AB (ref 4–21)
Creatinine: 1.1 (ref 0.6–1.3)
Glucose: 79
Potassium: 5.3 (ref 3.4–5.3)
Sodium: 138 (ref 137–147)

## 2018-09-30 LAB — TSH: TSH: 2.19 (ref 0.41–5.90)

## 2018-10-01 ENCOUNTER — Encounter: Payer: Self-pay | Admitting: Family

## 2018-10-01 ENCOUNTER — Non-Acute Institutional Stay: Payer: Medicare PPO | Admitting: Family

## 2018-10-01 DIAGNOSIS — I10 Essential (primary) hypertension: Secondary | ICD-10-CM | POA: Diagnosis not present

## 2018-10-01 DIAGNOSIS — E039 Hypothyroidism, unspecified: Secondary | ICD-10-CM | POA: Diagnosis not present

## 2018-10-01 DIAGNOSIS — R3911 Hesitancy of micturition: Secondary | ICD-10-CM

## 2018-10-01 DIAGNOSIS — K5901 Slow transit constipation: Secondary | ICD-10-CM

## 2018-10-01 DIAGNOSIS — N401 Enlarged prostate with lower urinary tract symptoms: Secondary | ICD-10-CM

## 2018-10-01 DIAGNOSIS — H6123 Impacted cerumen, bilateral: Secondary | ICD-10-CM

## 2018-10-01 NOTE — Progress Notes (Signed)
Location:  [facility] Room Number: 32 Place of Service:  ALF [phone]) Provider: Ceil Roderick FNP-C   [name], MD  Patient Care Team: [name], MD as PCP - General (Internal Medicine) Mast, Man X, NP as Nurse Practitioner (Internal Medicine)  Extended Emergency Contact Information Primary Emergency Contact: [name], [place] Home Phone: [phone] Mobile Phone: [phone] Relation: Son Secondary Emergency Contact: [name] Mobile Phone: [phone] Relation: Grandson  Code Status: DNR  Goals of care: Advanced Directive information Advanced Directives 10/01/2018  Does Patient Have a Medical Advance Directive? Yes  Type of Estate agent of [place];Living will;Out of facility DNR (pink MOST or yellow form)  Does patient want to make changes to medical advance directive? No - Patient declined  Copy of Healthcare Power of Attorney in Chart? Yes - validated most recent copy scanned in chart (See row information)  Would patient like information on creating a medical advance directive? -  Pre-existing out of facility DNR order (yellow form or pink MOST form) Yellow form placed in chart (order not valid for inpatient use);Pink MOST form placed in chart (order not valid for inpatient use)     Chief Complaint  Patient presents with  . Medical Management of Chronic Issues    Routine Visit     HPI:  Pt is a 85 y.o. male seen today [facility] for medical management of chronic diseases.He is seen in his room today with Nurse supervisor present at bedside.He denies any acute issues.He has had worsening hearing unable to locate his hearing aid during visit.Nurse brought hearing aid from medication cart but patient declined states not his.Hearing aid usually kept on cart since he run over his hearing aid accidentally with his power wheelchair.He has  had no recent fall episodes or weight changes.His blood pressure log reviewed readings ranging in the 90's/50's-140's/80's.He states his appetite is good.communicates well when questions are written in large print has difficulties with his vision too.      Past Medical History:  Diagnosis Date  . AAA (abdominal aortic aneurysm) (HCC)   . Abnormality of gait 08/01/2010  . Adhesive capsulitis of shoulder 08/01/2010   "Frozen" right shoulder  . Anemia   . Anxiety state, unspecified 08/01/2010  . Arthritis   . Congestive heart failure, unspecified 08/01/2010  . Cough 08/07/2015  . Disorders of bursae and tendons in shoulder region, unspecified 08/01/2010  . Edema 08/01/2010  . Fall   . Fecal impaction (HCC)   . GERD (gastroesophageal reflux disease)   . Hiatal hernia   . Hypercalcemia   . Hyperlipidemia   . Hypertension   . Hypertrophy of prostate without urinary obstruction and other lower urinary tract symptoms (LUTS) 08/01/2010  . Hyponatremia   . Hypothyroidism   . Infectious colitis   . Insomnia, unspecified 08/01/2010  . Joint pain   . Leg pain    with walking  . Lumbago 12/24/2010  . Orthostatic hypotension 11/05/2010  . Other specified cardiac dysrhythmias(427.89) 12/24/2010  . Pain in joint, pelvic region and thigh 08/06/2010  . Peptic ulcer, unspecified site, unspecified as acute or chronic, without mention of hemorrhage, perforation, or obstruction 08/01/2010  . Renal failure, acute (HCC)   . Swelling, mass, or lump in head and neck 01/03/2011  . Ulcer    Stomach  . Unspecified disorder of kidney and ureter 08/01/2010  .  Unspecified hearing loss 08/01/2010  . Unspecified vitamin D deficiency 08/06/2010  . Urinary frequency 09/13/2010  . Vertigo    Past Surgical History:  Procedure Laterality Date  . ABDOMINAL AORTIC ANEURYSM REPAIR  12-18-10   Stent graft repair of AAA  . APPENDECTOMY    . CATARACT EXTRACTION    . HEMORRHOID SURGERY  2000  . INGUINAL HERNIA REPAIR Left 2005   . TRANSTHORACIC ECHOCARDIOGRAM  07/19/2010    Left ventricle: There is hypokinesis of the inferior wall and  posterior wall. The EF is 35-40%. The cavity size was normal    Allergies  Allergen Reactions  . Aleve [Naproxen] Other (See Comments)    Per [facility]  . Indocin [Indomethacin] Other (See Comments)    Per [facility]    Allergies as of 10/01/2018      Reactions   Aleve [naproxen] Other (See Comments)   Per [facility]   Indocin [indomethacin] Other (See Comments)   [facility]      Medication List       Accurate as of October 01, 2018  5:04 PM. If you have any questions, ask your nurse or doctor.        acetaminophen 500 MG tablet Commonly known as: TYLENOL Take 1,000 mg by mouth 2 (two) times daily.   acetaminophen 500 MG tablet Commonly known as: TYLENOL Take 500 mg by mouth every 8 (eight) hours as needed.   Aspercreme Lidocaine 4 % Ptch Generic drug: Lidocaine Apply 1 patch topically See admin instructions. 1 patch applied once a day to mid lower back   benzocaine 10 % mucosal gel Commonly known as: ORAJEL Use as directed 1 application in the mouth or throat as needed for mouth pain. May keep at bedside and self-administer. Apply with cotton applicator to mouth and/or throat for mouth pain as needed.   bisacodyl 10 MG suppository Commonly known as: DULCOLAX Place 10 mg rectally every other day. For constipation Hold for loose stools   Cholecalciferol 25 MCG (1000 UT) tablet Take 2,000 Units by mouth daily.   cloNIDine 0.1 MG tablet Commonly known as: CATAPRES Take 0.1 mg by mouth 2 (two) times daily as needed.   DEXTRAN 70-HYPROMELLOSE (PF) OP Place 1 drop into both eyes as needed (for dry eyes). wait 3-5 minutes between eye drops   finasteride 5 MG tablet Commonly known as: PROSCAR Take 5 mg by mouth daily. Women who are pregnant or may become pregnant should use precautions when handling this medication.   fluocinonide 0.05 % external solution Commonly known as: LIDEX  Apply 1 application topically as needed. apply to scalp for itching   fluticasone 50 MCG/ACT nasal spray Commonly known as: FLONASE Place 2 sprays into both nostrils daily.   gabapentin 100 MG capsule Commonly known as: NEURONTIN Take 100 mg by mouth daily with supper.   hydrALAZINE 25 MG tablet Commonly known as: APRESOLINE Take 25 mg by mouth 2 (two) times daily.   hydrocortisone 1 % lotion Apply 1 application topically 2 (two) times daily as needed for itching.   ketoconazole 2 % shampoo Commonly known as: NIZORAL Apply 1 application topically once a week. On Tues.   levothyroxine 50 MCG tablet Commonly known as: SYNTHROID Take 50 mcg by mouth daily before breakfast.   losartan 25 MG tablet Commonly known as: COZAAR Take 25 mg by mouth daily. notify MD if SBP greater than 140 What changed: Another medication with the same name was removed. Continue taking this medication, and follow the  directions you see here. Changed by: Caesar Bookmaninah C Khayree Delellis, NP   magic mouthwash w/lidocaine Soln Take 5-10 mLs by mouth every 4 (four) hours as needed for mouth pain.   magnesium citrate Soln Take by mouth as needed for severe constipation. Per ED physician resident may take 10 oz by mouth followed by 8 oz of water as needed every 4th day if no bowel movement in 3 days   Mintox 200-200-20 MG/5ML suspension Generic drug: alum & mag hydroxide-simeth Take 30 mLs by mouth every 4 (four) hours as needed for indigestion or heartburn. PRN indigestion/stomach upset x 48 hours. Notify MD if symptoms continue. DO NOT USE FOR CHRONIC KIDNEY DISEASE STAGE FOUR OR HEMODIALYSIS PATIENTS.   omeprazole 20 MG tablet Commonly known as: PRILOSEC OTC Take 20 mg by mouth daily.   oxyCODONE 10 mg 12 hr tablet Commonly known as: OxyCONTIN Take 1 tablet (10 mg total) by mouth daily. Do Not Crush   polyethylene glycol 17 g packet Commonly known as: MIRALAX / GLYCOLAX Take 17 g by mouth 2 (two) times daily.  Mix in 4-8 ounces of fluids for constipation,Hold for loose stools   sennosides-docusate sodium 8.6-50 MG tablet Commonly known as: SENOKOT-S Take 2 tablets by mouth at bedtime.   SleepRight Breathe Aid Misc 1 strip at bedtime.   sodium chloride 0.65 % Soln nasal spray Commonly known as: OCEAN Place 1 spray into both nostrils 3 (three) times daily as needed for congestion.   triamcinolone 0.1 % paste Commonly known as: KENALOG Use as directed 1 application in the mouth or throat every 4 (four) hours as needed.   TUSSIN COUGH DM PO Take 5 mLs by mouth every 4 (four) hours as needed.       Review of Systems  Constitutional: Negative for appetite change, chills, fatigue, fever and unexpected weight change.  HENT: Positive for hearing loss. Negative for congestion, postnasal drip, rhinorrhea, sinus pressure, sinus pain, sneezing and sore throat.        Has upper and lower dentures   Eyes: Positive for visual disturbance. Negative for discharge, redness and itching.       Difficulty reading with eye glasses on.   Respiratory: Negative for cough, chest tightness, shortness of breath and wheezing.   Cardiovascular: Positive for leg swelling. Negative for chest pain and palpitations.  Gastrointestinal: Negative for abdominal distention, abdominal pain, constipation, diarrhea, nausea and vomiting.  Endocrine: Negative for cold intolerance, heat intolerance, polydipsia, polyphagia and polyuria.  Genitourinary: Negative for difficulty urinating, dysuria and flank pain.  Musculoskeletal: Positive for arthralgias and gait problem.  Skin: Negative for color change, pallor, rash and wound.  Neurological: Negative for dizziness, light-headedness, numbness and headaches.  Hematological: Does not bruise/bleed easily.  Psychiatric/Behavioral: Negative for agitation, behavioral problems and sleep disturbance. The patient is not nervous/anxious.     Immunization History  Administered Date(s)  Administered  . Influenza-Unspecified 12/27/2010, 12/29/2011, 12/27/2012, 12/13/2013, 12/14/2014, 12/20/2015, 12/17/2016, 12/14/2017  . PPD Test 07/30/2010, 08/14/2010  . Pneumococcal Conjugate-13 11/04/2016  . Pneumococcal Polysaccharide-23 03/10/2005  . Td 03/11/1999  . Tdap 10/31/2016   Pertinent  Health Maintenance Due  Topic Date Due  . INFLUENZA VACCINE  10/09/2018  . PNA vac Low Risk Adult  Completed   Fall Risk  11/04/2017 10/29/2016 08/07/2015 08/07/2015 04/10/2015  Falls in the past year? No Yes Yes No No  Comment - - 06/02/15- skin tear right arm - -  Number falls in past yr: - 1 - - -  Injury with  Fall? - No - - -   Functional Status Survey:    Vitals:   10/01/18 0955  BP: (!) 100/55  Pulse: (!) 52  Resp: 20  Temp: 98.2 F (36.8 C)  TempSrc: Oral  SpO2: 95%  Weight: 135 lb 6.4 oz (61.4 kg)  Height: 5\' 9"  (1.753 m)   Body mass index is 20 kg/m. Physical Exam Vitals signs and nursing note reviewed.  Constitutional:      General: He is not in acute distress.    Appearance: He is normal weight. He is not ill-appearing.  HENT:     Head: Normocephalic.     Comments: Very HOH     Right Ear: There is impacted cerumen.     Left Ear: There is impacted cerumen.     Nose: Nose normal. No congestion or rhinorrhea.     Mouth/Throat:     Mouth: Mucous membranes are moist.     Pharynx: Oropharynx is clear. No oropharyngeal exudate or posterior oropharyngeal erythema.  Eyes:     General: No scleral icterus.       Right eye: No discharge.        Left eye: No discharge.     Conjunctiva/sclera: Conjunctivae normal.     Pupils: Pupils are equal, round, and reactive to light.     Comments: Has corrective lens.uses magnifying glass for reading  Neck:     Musculoskeletal: Normal range of motion. No neck rigidity or muscular tenderness.  Cardiovascular:     Rate and Rhythm: Normal rate and regular rhythm.     Pulses: Normal pulses.     Heart sounds: Murmur present. No  friction rub. No gallop.   Pulmonary:     Effort: Pulmonary effort is normal. No respiratory distress.     Breath sounds: Normal breath sounds. No wheezing, rhonchi or rales.  Chest:     Chest wall: No tenderness.  Abdominal:     General: Abdomen is flat. Bowel sounds are normal. There is no distension.     Palpations: Abdomen is soft. There is no mass.     Tenderness: There is no abdominal tenderness. There is no right CVA tenderness, left CVA tenderness, guarding or rebound.  Musculoskeletal:     Comments: Moves x 4 extremities.unteady gait transfers self from bed to power wheelchair. Bilateral lower extremities trace edema.   Lymphadenopathy:     Cervical: No cervical adenopathy.  Skin:    General: Skin is warm and dry.     Coloration: Skin is not pale.     Findings: No bruising, erythema or rash.  Neurological:     Mental Status: He is alert. Mental status is at baseline.     Cranial Nerves: No cranial nerve deficit.     Sensory: No sensory deficit.     Motor: No weakness.     Coordination: Coordination normal.     Gait: Gait abnormal.  Psychiatric:        Mood and Affect: Mood normal.        Behavior: Behavior normal.        Thought Content: Thought content normal.        Judgment: Judgment normal.    Labs reviewed: Recent Labs    07/08/18 07/22/18 08/19/18  NA 135* 135* 136*  K 5.6* 5.0 5.0  BUN 52* 50* 54*  CREATININE 1.3 1.3 1.3    Recent Labs    01/28/18 01/31/18 07/08/18  WBC 6.4 7.1 6.5  HGB 9.9* 9.8* 30.1*  HCT 30* 30* 90*  PLT 191 198 185   Lab Results  Component Value Date   TSH 2.84 09/21/2017   Lab Results  Component Value Date   HGBA1C 5.6 04/06/2017   Lab Results  Component Value Date   CHOL 145 04/06/2017   HDL 47 04/06/2017   LDLCALC 89 04/06/2017   TRIG 47 04/06/2017   CHOLHDL 3.1 04/06/2017    Significant Diagnostic Results in last 30 days:  No results found.  Assessment/Plan 1. Essential hypertension B/p reviewed  stable.continue on Hydralazine 25 mg tablet twice daily ,losartan 25 mg tablet daily and clonidine 0.1 mg tablet twice daily as needed.    2. Hypothyroidism, unspecified type Lab Results  Component Value Date   TSH 2.84 09/21/2017  Continue on levothyroxine 50 mcg tablet daily.TSH level 10/04/2018   3. Benign prostatic hyperplasia with urinary hesitancy No urine retention reported.Continue finasteride 5 mg tablet daily.  4. Slow transit constipation Current regimen effective.continue to encourage hydration.   5. Bilateral impacted cerumen Debrox 6.5% otic solution instil 5 drops into both ears twice daily x 4 days then facility Nurse to lavage with warm water.   Family/ staff Communication: Reviewed plan of care with patient and facility Nurse supervisor   Labs/tests ordered: TSH level 10/04/2018   Sandrea Hughs, NP

## 2018-10-04 ENCOUNTER — Other Ambulatory Visit: Payer: Self-pay | Admitting: *Deleted

## 2018-10-04 DIAGNOSIS — M792 Neuralgia and neuritis, unspecified: Secondary | ICD-10-CM

## 2018-10-04 MED ORDER — OXYCODONE HCL ER 10 MG PO T12A: 10.0000 mg | EXTENDED_RELEASE_TABLET | Freq: Every day | ORAL | 0 refills | Status: DC

## 2018-10-04 NOTE — Telephone Encounter (Signed)
Received fax order from [facility] and sent to [employer] for approval.  

## 2018-10-21 DIAGNOSIS — H6123 Impacted cerumen, bilateral: Secondary | ICD-10-CM | POA: Diagnosis not present

## 2018-10-21 DIAGNOSIS — H60333 Swimmer's ear, bilateral: Secondary | ICD-10-CM | POA: Diagnosis not present

## 2018-11-02 ENCOUNTER — Non-Acute Institutional Stay: Payer: Medicare PPO | Admitting: Nurse Practitioner

## 2018-11-02 ENCOUNTER — Encounter: Payer: Self-pay | Admitting: Nurse Practitioner

## 2018-11-02 ENCOUNTER — Other Ambulatory Visit: Payer: Self-pay | Admitting: *Deleted

## 2018-11-02 DIAGNOSIS — M792 Neuralgia and neuritis, unspecified: Secondary | ICD-10-CM

## 2018-11-02 DIAGNOSIS — Z0000 Encounter for general adult medical examination without abnormal findings: Secondary | ICD-10-CM | POA: Diagnosis not present

## 2018-11-02 MED ORDER — OXYCODONE HCL ER 10 MG PO T12A: 10.0000 mg | EXTENDED_RELEASE_TABLET | Freq: Every day | ORAL | 0 refills | Status: DC

## 2018-11-02 NOTE — Telephone Encounter (Signed)
Received fax refill request from [facility] and sent to Dr. Gupta for approval.  

## 2018-11-02 NOTE — Telephone Encounter (Signed)
[facility] Refill Request Pended and sent to Dr. Lyndel Safe for approval.

## 2018-11-02 NOTE — Progress Notes (Signed)
Subjective:   Ricardo [name] is a 85 y.o. male who presents for Medicare Annual/Subsequent preventive examination at [facility]     Objective:    Vitals: BP (!) 145/67    Pulse 87    Temp 97.7 F (36.5 C)    Resp 20    Ht 5\' 9"  (1.753 m)    Wt 135 lb (61.2 kg)    SpO2 96%    BMI 19.94 kg/m   Body mass index is 19.94 kg/m.  Advanced Directives 10/01/2018 07/07/2018 06/29/2018 03/30/2018 02/10/2018 01/27/2018 11/04/2017  Does Patient Have a Medical Advance Directive? Yes Yes Yes Yes Yes Yes Yes  Type of Estate agent of [place];Living will;Out of facility DNR (pink MOST or yellow form) Living will;Healthcare Power of [place];Out of facility DNR (pink MOST or yellow form) Healthcare Power of [place];Out of facility DNR (pink MOST or yellow form) Healthcare Power of [place];Living will;Out of facility DNR (pink MOST or yellow form) Healthcare Power of [place];Out of facility DNR (pink MOST or yellow form);Living will Out of facility DNR (pink MOST or yellow form);Healthcare Power of [place];Living will Healthcare Power of [place];Out of facility DNR (pink MOST or yellow form);Living will  Does patient want to make changes to medical advance directive? No - Patient declined No - Patient declined No - Patient declined No - Patient declined No - Patient declined No - Patient declined No - Patient declined  Copy of Healthcare Power of Attorney in Chart? Yes - validated most recent copy scanned in chart (See row information) Yes - validated most recent copy scanned in chart (See row information) Yes - validated most recent copy scanned in chart (See row information) Yes - validated most recent copy scanned in chart (See row information) Yes - validated most recent copy scanned in chart (See row information) Yes - validated most recent copy scanned in chart (See row information) Yes  Would patient like information on creating a medical advance directive? - -  - - - - -  Pre-existing out of facility DNR order (yellow form or pink MOST form) Yellow form placed in chart (order not valid for inpatient use);Pink MOST form placed in chart (order not valid for inpatient use) Yellow form placed in chart (order not valid for inpatient use);Pink MOST form placed in chart (order not valid for inpatient use) Yellow form placed in chart (order not valid for inpatient use);Pink MOST form placed in chart (order not valid for inpatient use) Yellow form placed in chart (order not valid for inpatient use);Pink MOST form placed in chart (order not valid for inpatient use) Yellow form placed in chart (order not valid for inpatient use);Pink MOST form placed in chart (order not valid for inpatient use) Yellow form placed in chart (order not valid for inpatient use);Pink MOST form placed in chart (order not valid for inpatient use) Yellow form placed in chart (order not valid for inpatient use);Pink MOST form placed in chart (order not valid for inpatient use)    Tobacco Social History   Tobacco Use  Smoking Status Never Smoker  Smokeless Tobacco Never Used     Counseling given: Not Answered   Clinical Intake:  Pre-visit preparation completed: Yes  Pain : (S) 0-10(aches all over) Pain Score: (aches all over) Pain Descriptors / Indicators: Aching Pain Onset: (chronic) Pain Relieving Factors: Oxycodone, Tylenol, Lidocain Effect of Pain on Daily Activities: effective  Pain Relieving Factors: Oxycodone, Tylenol, Lidocain  Nutritional Status: BMI of 19-24  Normal Diabetes: No  How often do you need to have someone help you when you read instructions, pamphlets, or other written materials from your doctor or pharmacy?: 3 - Sometimes  Interpreter Needed?: No  Information entered by :: Juletta Berhe Bretta Bang Np  Past Medical History:  Diagnosis Date   AAA (abdominal aortic aneurysm) ([place])    Abnormality of gait 08/01/2010   Adhesive capsulitis of shoulder 08/01/2010    "Frozen" right shoulder   Anemia    Anxiety state, unspecified 08/01/2010   Arthritis    Congestive heart failure, unspecified 08/01/2010   Cough 08/07/2015   Disorders of bursae and tendons in shoulder region, unspecified 08/01/2010   Edema 08/01/2010   Fall    Fecal impaction ([place])    GERD (gastroesophageal reflux disease)    Hiatal hernia    Hypercalcemia    Hyperlipidemia    Hypertension    Hypertrophy of prostate without urinary obstruction and other lower urinary tract symptoms (LUTS) 08/01/2010   Hyponatremia    Hypothyroidism    Infectious colitis    Insomnia, unspecified 08/01/2010   Joint pain    Leg pain    with walking   Lumbago 12/24/2010   Orthostatic hypotension 11/05/2010   Other specified cardiac dysrhythmias(427.89) 12/24/2010   Pain in joint, pelvic region and thigh 08/06/2010   Peptic ulcer, unspecified site, unspecified as acute or chronic, without mention of hemorrhage, perforation, or obstruction 08/01/2010   Renal failure, acute (HCC)    Swelling, mass, or lump in head and neck 01/03/2011   Ulcer    Stomach   Unspecified disorder of kidney and ureter 08/01/2010   Unspecified hearing loss 08/01/2010   Unspecified vitamin D deficiency 08/06/2010   Urinary frequency 09/13/2010   Vertigo    Past Surgical History:  Procedure Laterality Date   ABDOMINAL AORTIC ANEURYSM REPAIR  12-18-10   Stent graft repair of AAA   APPENDECTOMY     CATARACT EXTRACTION     HEMORRHOID SURGERY  2000   INGUINAL HERNIA REPAIR Left 2005   TRANSTHORACIC ECHOCARDIOGRAM  07/19/2010    Left ventricle: There is hypokinesis of the inferior wall and  posterior wall. The EF is 35-40%. The cavity size was normal   Family History  Problem Relation Age of Onset   Heart failure Brother    Social History   Socioeconomic History   Marital status: Widowed    Spouse name: Not on file   Number of children: Not on file   Years of education: Not on file     Highest education level: Not on file  Occupational History   Occupation: previous owner of [employer] [place]  Social Needs   Financial resource strain: Not hard at all   Food insecurity    Worry: Never true    Inability: Never true   Transportation needs    Medical: No    Non-medical: No  Tobacco Use   Smoking status: Never Smoker   Smokeless tobacco: Never Used  Substance and Sexual Activity   Alcohol use: Yes    Alcohol/week: 0.0 standard drinks    Comment: 1 1/2 oz daily    Drug use: No   Sexual activity: Never  Lifestyle   Physical activity    Days per week: 0 days    Minutes per session: 0 min   Stress: Not at all  Relationships   Social connections    Talks on phone: More than three times a week    Gets together: Twice  a week    Attends religious service: Never    Active member of club or organization: No    Attends meetings of clubs or organizations: Never    Relationship status: Widowed  Other Topics Concern   Not on file  Social History Narrative   [facility] to [facility] 09/06/10   Living Will, MOST, POA   Widowed   Exercises: no   Power wheelchair   Alcohol 1 1/2 oz daily   Never smoked             Outpatient Encounter Medications as of 11/02/2018  Medication Sig   acetaminophen (TYLENOL) 500 MG tablet Take 1,000 mg by mouth 2 (two) times daily.    acetaminophen (TYLENOL) 500 MG tablet Take 500 mg by mouth every 8 (eight) hours as needed.    alum & mag hydroxide-simeth (MINTOX) 200-200-20 MG/5ML suspension Take 30 mLs by mouth every 4 (four) hours as needed for indigestion or heartburn. PRN indigestion/stomach upset x 48 hours. Notify MD if symptoms continue. DO NOT USE FOR CHRONIC KIDNEY DISEASE STAGE FOUR OR HEMODIALYSIS PATIENTS.   bisacodyl (DULCOLAX) 10 MG suppository Place 10 mg rectally every other day. For constipation Hold for loose stools   Cholecalciferol 25 MCG (1000 UT) tablet Take 2,000 Units  by mouth daily.    cloNIDine (CATAPRES) 0.1 MG tablet Take 0.1 mg by mouth 2 (two) times daily as needed.   DEXTRAN 70-HYPROMELLOSE, PF, OP Place 1 drop into both eyes as needed (for dry eyes). wait 3-5 minutes between eye drops   Dextromethorphan-Guaifenesin (TUSSIN COUGH DM PO) Take 5 mLs by mouth every 4 (four) hours as needed.   finasteride (PROSCAR) 5 MG tablet Take 5 mg by mouth daily. Women who are pregnant or may become pregnant should use precautions when handling this medication.   fluocinonide (LIDEX) 0.05 % external solution Apply 1 application topically as needed. apply to scalp for itching   fluticasone (FLONASE) 50 MCG/ACT nasal spray Place 2 sprays into both nostrils daily.   gabapentin (NEURONTIN) 100 MG capsule Take 100 mg by mouth daily.   hydrALAZINE (APRESOLINE) 25 MG tablet Take 25 mg by mouth 2 (two) times daily.   hydrocortisone 1 % lotion Apply 1 application topically 2 (two) times daily as needed for itching.    ketoconazole (NIZORAL) 2 % shampoo Apply 1 application topically once a week. On Tues.   levothyroxine (SYNTHROID, LEVOTHROID) 50 MCG tablet Take 50 mcg by mouth daily before breakfast.    Lidocaine (ASPERCREME LIDOCAINE) 4 % PTCH Apply 1 patch topically See admin instructions. 1 patch applied once a day to mid lower back   losartan (COZAAR) 25 MG tablet Take 25 mg by mouth daily. notify MD if SBP greater than 140   magic mouthwash w/lidocaine SOLN Take 5-10 mLs by mouth every 4 (four) hours as needed for mouth pain.   magnesium citrate SOLN Take by mouth as needed for severe constipation. Per ED physician resident may take 10 oz by mouth followed by 8 oz of water as needed every 4th day if no bowel movement in 3 days   Misc. Devices (SLEEPRIGHT BREATHE AID) MISC 1 strip at bedtime.   omeprazole (PRILOSEC OTC) 20 MG tablet Take 20 mg by mouth daily.   polyethylene glycol (MIRALAX / GLYCOLAX) packet Take 17 g by mouth 2 (two) times daily. Mix in  4-8 ounces of fluids for constipation,Hold for loose stools   polyvinyl alcohol (LIQUIFILM TEARS)  1.4 % ophthalmic solution Place 1 drop into both eyes 2 (two) times daily.   sennosides-docusate sodium (SENOKOT-S) 8.6-50 MG tablet Take 2 tablets by mouth at bedtime.   sodium chloride (OCEAN) 0.65 % SOLN nasal spray Place 1 spray into both nostrils 3 (three) times daily as needed for congestion.   triamcinolone (KENALOG) 0.1 % paste Use as directed 1 application in the mouth or throat every 4 (four) hours as needed.   [DISCONTINUED] oxyCODONE (OXYCONTIN) 10 mg 12 hr tablet Take 1 tablet (10 mg total) by mouth daily. At 6am. Do Not Crush   [DISCONTINUED] Amino Acids-Protein Hydrolys (FEEDING SUPPLEMENT, PRO-STAT SUGAR FREE 64,) LIQD Take 30 mLs by mouth 2 (two) times daily.   [DISCONTINUED] aspirin EC 81 MG tablet Take 81 mg by mouth daily.   [DISCONTINUED] benzocaine (ORAJEL) 10 % mucosal gel Use as directed 1 application in the mouth or throat as needed for mouth pain. May keep at bedside and self-administer. Apply with cotton applicator to mouth and/or throat for mouth pain as needed.   [DISCONTINUED] gabapentin (NEURONTIN) 100 MG capsule Take 100 mg by mouth daily with supper.    [DISCONTINUED] gabapentin (NEURONTIN) 300 MG capsule Take 300 mg by mouth 3 (three) times daily.   [DISCONTINUED] insulin aspart (NOVOLOG) 100 UNIT/ML injection Inject 14 Units into the skin 3 (three) times daily before meals.   [DISCONTINUED] insulin detemir (LEVEMIR) 100 UNIT/ML injection Inject 20 Units into the skin at bedtime.   [DISCONTINUED] Nutritional Supplements (BOOST GLUCOSE CONTROL) LIQD Take 237 mLs by mouth 2 (two) times daily between meals.   No facility-administered encounter medications on file as of 11/02/2018.     Activities of Daily Living In your present state of health, do you have any difficulty performing the following activities: 11/02/2018 11/04/2017  Hearing? Malvin Johns  Vision? Y N    Difficulty concentrating or making decisions? Y N  Walking or climbing stairs? Y Y  Dressing or bathing? Y Y  Doing errands, shopping? Malvin Johns  Preparing Food and eating ? Y Y  Using the Toilet? N Y  In the past six months, have you accidently leaked urine? Y Y  Do you have problems with loss of bowel control? Y N  Managing your Medications? Y Y  Managing your Finances? Malvin Johns  Housekeeping or managing your Housekeeping? Malvin Johns  Some recent data might be hidden    Patient Care Team: [name], MD as PCP - General (Internal Medicine) Afomia Blackley X, NP as Nurse Practitioner (Internal Medicine)   Assessment:   This is a routine wellness examination for Melven.  Exercise Activities and Dietary recommendations Current Exercise Habits: Home exercise routine, Type of exercise: stretching, Time (Minutes): 10, Frequency (Times/Week): 3, Weekly Exercise (Minutes/Week): 30, Intensity: Mild, Exercise limited by: neurologic condition(s);orthopedic condition(s);cardiac condition(s)  Goals     DIET - INCREASE WATER INTAKE     Increase physical activity       Fall Risk Fall Risk  11/04/2017 10/29/2016 08/07/2015 08/07/2015 04/10/2015  Falls in the past year? No Yes Yes No No  Comment - - 06/02/15- skin tear right arm - -  Number falls in past yr: - 1 - - -  Injury with Fall? - No - - -   Is the patient's home free of loose throw rugs in walkways, pet beds, electrical cords, etc?  No      Grab bars in the bathroom? Yes      Handrails on the stairs?  Yes      Adequate lighting?   Yes  Timed Get Up and Go Performed: unable to perform. Self transfer from and to motorized scooter.   Depression Screen PHQ 2/9 Scores 11/04/2017 10/29/2016  PHQ - 2 Score 0 0    Cognitive Function MMSE - Mini Mental State Exam 11/02/2018 11/04/2017 10/29/2016  Not completed: - Unable to complete Unable to complete  Orientation to time 2 - -  Orientation to Place 5 - -  Registration 3 - -  Attention/ Calculation 0 -  -  Recall 3 - -  Language- name 2 objects 2 - -  Language- repeat 1 - -  Language- follow 3 step command 3 - -  Language- read & follow direction 1 - -  Write a sentence 0 - -  Copy design 1 - -  Total score 21 - -        Immunization History  Administered Date(s) Administered   Influenza-Unspecified 12/27/2010, 12/29/2011, 12/27/2012, 12/13/2013, 12/14/2014, 12/20/2015, 12/17/2016, 12/14/2017   PPD Test 07/30/2010, 08/14/2010   Pneumococcal Conjugate-13 11/04/2016   Pneumococcal Polysaccharide-23 03/10/2005   Td 03/11/1999   Tdap 10/31/2016    Qualifies for Shingles Vaccine? yes  Screening Tests Health Maintenance  Topic Date Due   INFLUENZA VACCINE  10/09/2018   TETANUS/TDAP  11/01/2026   PNA vac Low Risk Adult  Completed   Cancer Screenings: Lung: Low Dose CT Chest recommended if Age 35-80 years, 30 pack-year currently smoking OR have quit w/in 15years. Patient Not  qualify. Colorectal: Not qualify  Additional Screenings: no Hepatitis C Screening:      Plan:   Shrigrix prescription provided.   I have personally reviewed and noted the following in the patients chart:    Medical and social history  Use of alcohol, tobacco or illicit drugs   Current medications and supplements  Functional ability and status  Nutritional status  Physical activity  Advanced directives  List of other physicians  Hospitalizations, surgeries, and ER visits in previous 12 months  Vitals  Screenings to include cognitive, depression, and falls  Referrals and appointments  In addition, I have reviewed and discussed with patient certain preventive protocols, quality metrics, and best practice recommendations. A written personalized care plan for preventive services as well as general preventive health recommendations were provided to patient.     Keaira Whitehurst X Jiraiya Mcewan, NP  11/02/2018

## 2018-11-03 ENCOUNTER — Encounter: Payer: Self-pay | Admitting: Internal Medicine

## 2018-11-03 ENCOUNTER — Non-Acute Institutional Stay: Payer: Medicare PPO | Admitting: Internal Medicine

## 2018-11-03 DIAGNOSIS — N183 Chronic kidney disease, stage 3 unspecified: Secondary | ICD-10-CM

## 2018-11-03 DIAGNOSIS — R2681 Unsteadiness on feet: Secondary | ICD-10-CM

## 2018-11-03 DIAGNOSIS — E559 Vitamin D deficiency, unspecified: Secondary | ICD-10-CM | POA: Diagnosis not present

## 2018-11-03 DIAGNOSIS — I959 Hypotension, unspecified: Secondary | ICD-10-CM | POA: Diagnosis not present

## 2018-11-03 DIAGNOSIS — Z79899 Other long term (current) drug therapy: Secondary | ICD-10-CM | POA: Diagnosis not present

## 2018-11-03 DIAGNOSIS — E039 Hypothyroidism, unspecified: Secondary | ICD-10-CM | POA: Diagnosis not present

## 2018-11-03 DIAGNOSIS — I10 Essential (primary) hypertension: Secondary | ICD-10-CM | POA: Diagnosis not present

## 2018-11-03 DIAGNOSIS — R531 Weakness: Secondary | ICD-10-CM

## 2018-11-03 LAB — CBC AND DIFFERENTIAL
HCT: 30 — AB (ref 41–53)
Hemoglobin: 9.6 — AB (ref 13.5–17.5)
Neutrophils Absolute: 4550
Platelets: 203 (ref 150–399)
WBC: 7.2

## 2018-11-03 LAB — BASIC METABOLIC PANEL
BUN: 46 — AB (ref 4–21)
Creatinine: 1.2 (ref 0.6–1.3)
Glucose: 102
Potassium: 5 (ref 3.4–5.3)
Sodium: 135 — AB (ref 137–147)

## 2018-11-03 LAB — HEPATIC FUNCTION PANEL
ALT: 6 — AB (ref 10–40)
AST: 14 (ref 14–40)
Alkaline Phosphatase: 132 — AB (ref 25–125)
Bilirubin, Total: 0.4

## 2018-11-03 NOTE — Progress Notes (Signed)
Location:  [facility] Room Number: 32 Place of Service:  ALF [phone]) Provider:  Einar Crow  MD  [name], MD  Patient Care Team: [name], MD as PCP - General (Internal Medicine) Mast, Man X, NP as Nurse Practitioner (Internal Medicine)  Extended Emergency Contact Information Primary Emergency Contact: [name], [place] Home Phone: [phone] Mobile Phone: [phone] Relation: Son Secondary Emergency Contact: [name] [place] Mobile Phone: [phone] Relation: Grandson  Code Status:  DNR Goals of care: Advanced Directive information Advanced Directives 11/03/2018  Does Patient Have a Medical Advance Directive? Yes  Type of Estate agent of [place];Living will;Out of facility DNR (pink MOST or yellow form)  Does patient want to make changes to medical advance directive? No - Patient declined  Copy of Healthcare Power of Attorney in Chart? Yes - validated most recent copy scanned in chart (See row information)  Would patient like information on creating a medical advance directive? -  Pre-existing out of facility DNR order (yellow form or pink MOST form) Yellow form placed in chart (order not valid for inpatient use)     Chief Complaint  Patient presents with  . Acute Visit    HPI:  Pt is a 85 y.o. male seen today for an acute visit for Weakness and Hypotension  Patient is a resident in [facility] He has a history of AAA with measurement of 9.3 cm in CT scan in 07/ 19, hypertension, peripheral neuropathy, BPH, hypothyroid, GERD For past few days Nurses have noticed that patient has been lying in the bed most of the time. C/o Feeling weak and Tired. And today his BP was low.SBP 80-90 He is very hard of hearing so we have to write on the board to talk to him But he denied any SOB or chest pain or Dizziness. No Cough or fever or Abdominal  Pain He said he is tired and trying to do his best. Has lost 5 lbs in Past few months  Past Medical History:  Diagnosis Date  . AAA (abdominal aortic aneurysm) (HCC)   . Abnormality of gait 08/01/2010  . Adhesive capsulitis of shoulder 08/01/2010   "Frozen" right shoulder  . Anemia   . Anxiety state, unspecified 08/01/2010  . Arthritis   . Congestive heart failure, unspecified 08/01/2010  . Cough 08/07/2015  . Disorders of bursae and tendons in shoulder region, unspecified 08/01/2010  . Edema 08/01/2010  . Fall   . Fecal impaction (HCC)   . GERD (gastroesophageal reflux disease)   . Hiatal hernia   . Hypercalcemia   . Hyperlipidemia   . Hypertension   . Hypertrophy of prostate without urinary obstruction and other lower urinary tract symptoms (LUTS) 08/01/2010  . Hyponatremia   . Hypothyroidism   . Infectious colitis   . Insomnia, unspecified 08/01/2010  . Joint pain   . Leg pain    with walking  . Lumbago 12/24/2010  . Orthostatic hypotension 11/05/2010  . Other specified cardiac dysrhythmias(427.89) 12/24/2010  . Pain in joint, pelvic region and thigh 08/06/2010  . Peptic ulcer, unspecified site, unspecified as acute or chronic, without mention of hemorrhage, perforation, or obstruction 08/01/2010  . Renal failure, acute (HCC)   . Swelling, mass, or lump in head and neck 01/03/2011  . Ulcer    Stomach  . Unspecified disorder of kidney and ureter 08/01/2010  . Unspecified  hearing loss 08/01/2010  . Unspecified vitamin D deficiency 08/06/2010  . Urinary frequency 09/13/2010  . Vertigo    Past Surgical History:  Procedure Laterality Date  . ABDOMINAL AORTIC ANEURYSM REPAIR  12-18-10   Stent graft repair of AAA  . APPENDECTOMY    . CATARACT EXTRACTION    . HEMORRHOID SURGERY  2000  . INGUINAL HERNIA REPAIR Left 2005  . TRANSTHORACIC ECHOCARDIOGRAM  07/19/2010    Left ventricle: There is hypokinesis of the inferior wall and  posterior wall. The EF is 35-40%. The cavity size was  normal    Allergies  Allergen Reactions  . Aleve [Naproxen] Other (See Comments)    Per MAR  . Indocin [Indomethacin] Other (See Comments)    Per [facility]    Outpatient Encounter Medications as of 11/03/2018  Medication Sig  . acetaminophen (TYLENOL) 500 MG tablet Take 1,000 mg by mouth 2 (two) times daily.   Marland Kitchen acetaminophen (TYLENOL) 500 MG tablet Take 500 mg by mouth every 8 (eight) hours as needed.   Marland Kitchen alum & mag hydroxide-simeth (MINTOX) 950-932-67 MG/5ML suspension Take 30 mLs by mouth every 4 (four) hours as needed for indigestion or heartburn. PRN indigestion/stomach upset x 48 hours. Notify MD if symptoms continue. DO NOT USE FOR CHRONIC KIDNEY DISEASE STAGE FOUR OR HEMODIALYSIS PATIENTS.  . bisacodyl (DULCOLAX) 10 MG suppository Place 10 mg rectally every other day. For constipation Hold for loose stools  . Cholecalciferol 25 MCG (1000 UT) tablet Take 2,000 Units by mouth daily.   . cloNIDine (CATAPRES) 0.1 MG tablet Take 0.1 mg by mouth 2 (two) times daily as needed.  Marland Kitchen DEXTRAN 70-HYPROMELLOSE, PF, OP Place 1 drop into both eyes as needed (for dry eyes). wait 3-5 minutes between eye drops  . Dextromethorphan-Guaifenesin (TUSSIN COUGH DM PO) Take 5 mLs by mouth every 4 (four) hours as needed.  . finasteride (PROSCAR) 5 MG tablet Take 5 mg by mouth daily. Women who are pregnant or may become pregnant should use precautions when handling this medication.  . fluocinonide (LIDEX) 0.05 % external solution Apply 1 application topically as needed. apply to scalp for itching  . fluticasone (FLONASE) 50 MCG/ACT nasal spray Place 2 sprays into both nostrils daily.  Marland Kitchen gabapentin (NEURONTIN) 100 MG capsule Take 100 mg by mouth daily.  . hydrALAZINE (APRESOLINE) 25 MG tablet Take 25 mg by mouth 2 (two) times daily.  . hydrocortisone 1 % lotion Apply 1 application topically 2 (two) times daily as needed for itching.   Marland Kitchen ketoconazole (NIZORAL) 2 % shampoo Apply 1 application topically once a week. On  Tues.  Marland Kitchen levothyroxine (SYNTHROID, LEVOTHROID) 50 MCG tablet Take 50 mcg by mouth daily before breakfast.   . Lidocaine (ASPERCREME LIDOCAINE) 4 % PTCH Apply 1 patch topically See admin instructions. 1 patch applied once a day to mid lower back  . losartan (COZAAR) 25 MG tablet Take 25 mg by mouth daily. notify MD if SBP greater than 140  . magic mouthwash w/lidocaine SOLN Take 5-10 mLs by mouth every 4 (four) hours as needed for mouth pain.  . magnesium citrate SOLN Take by mouth as needed for severe constipation. Per ED physician resident may take 10 oz by mouth followed by 8 oz of water as needed every 4th day if no bowel movement in 3 days  . Misc. Devices (SLEEPRIGHT BREATHE AID) MISC 1 strip at bedtime.  Marland Kitchen omeprazole (PRILOSEC OTC) 20 MG tablet Take 20 mg by mouth daily.  Marland Kitchen oxyCODONE (OXYCONTIN)  10 mg 12 hr tablet Take 1 tablet (10 mg total) by mouth daily. At 6am. Do Not Crush  . polyethylene glycol (MIRALAX / GLYCOLAX) packet Take 17 g by mouth 2 (two) times daily. Mix in 4-8 ounces of fluids for constipation,Hold for loose stools  . polyvinyl alcohol (LIQUIFILM TEARS) 1.4 % ophthalmic solution Place 1 drop into both eyes 2 (two) times daily.  . sennosides-docusate sodium (SENOKOT-S) 8.6-50 MG tablet Take 2 tablets by mouth at bedtime.  . sodium chloride (OCEAN) 0.65 % SOLN nasal spray Place 1 spray into both nostrils 3 (three) times daily as needed for congestion.  . triamcinolone (KENALOG) 0.1 % paste Use as directed 1 application in the mouth or throat every 4 (four) hours as needed.   No facility-administered encounter medications on file as of 11/03/2018.     Review of Systems  Constitutional: Positive for activity change and appetite change.  HENT: Negative.   Respiratory: Negative.   Cardiovascular: Negative.   Gastrointestinal: Negative.   Genitourinary: Negative.   Musculoskeletal: Negative.   Skin: Negative.   Neurological: Positive for weakness.  Psychiatric/Behavioral:  Negative.   All other systems reviewed and are negative.   Immunization History  Administered Date(s) Administered  . Influenza-Unspecified 12/27/2010, 12/29/2011, 12/27/2012, 12/13/2013, 12/14/2014, 12/20/2015, 12/17/2016, 12/14/2017  . PPD Test 07/30/2010, 08/14/2010  . Pneumococcal Conjugate-13 11/04/2016  . Pneumococcal Polysaccharide-23 03/10/2005  . Td 03/11/1999  . Tdap 10/31/2016   Pertinent  Health Maintenance Due  Topic Date Due  . INFLUENZA VACCINE  10/09/2018  . PNA vac Low Risk Adult  Completed   Fall Risk  11/04/2017 10/29/2016 08/07/2015 08/07/2015 04/10/2015  Falls in the past year? No Yes Yes No No  Comment - - 06/02/15- skin tear right arm - -  Number falls in past yr: - 1 - - -  Injury with Fall? - No - - -   Functional Status Survey:    Vitals:   11/03/18 1019  BP: (!) 82/59  Pulse: 72  Resp: 20  Temp: 97.6 F (36.4 C)  SpO2: 100%  Weight: 132 lb 6.4 oz (60.1 kg)  Height: 5\' 9"  (1.753 m)   Body mass index is 19.55 kg/m. Physical Exam Vitals signs reviewed.  HENT:     Head: Normocephalic.     Nose: Nose normal.     Mouth/Throat:     Mouth: Mucous membranes are moist.     Pharynx: Oropharynx is clear.  Eyes:     Pupils: Pupils are equal, round, and reactive to light.  Neck:     Musculoskeletal: Neck supple.  Cardiovascular:     Rate and Rhythm: Normal rate and regular rhythm.     Pulses: Normal pulses.  Pulmonary:     Effort: Pulmonary effort is normal. No respiratory distress.     Breath sounds: Normal breath sounds. No wheezing or rales.  Abdominal:     General: Abdomen is flat. Bowel sounds are normal. There is no distension.     Palpations: Abdomen is soft.     Tenderness: There is no abdominal tenderness.  Musculoskeletal:        General: No swelling.  Skin:    General: Skin is warm and dry.  Neurological:     General: No focal deficit present.     Mental Status: He is alert and oriented to person, place, and time.  Psychiatric:         Mood and Affect: Mood normal.        Thought  Content: Thought content normal.        Judgment: Judgment normal.     Labs reviewed: Recent Labs    07/08/18 07/22/18 08/19/18  NA 135* 135* 136*  K 5.6* 5.0 5.0  BUN 52* 50* 54*  CREATININE 1.3 1.3 1.3   No results for input(s): AST, ALT, ALKPHOS, BILITOT, PROT, ALBUMIN in the last 8760 hours. Recent Labs    01/28/18 01/31/18 07/08/18  WBC 6.4 7.1 6.5  HGB 9.9* 9.8* 30.1*  HCT 30* 30* 90*  PLT 191 198 185   Lab Results  Component Value Date   TSH 2.84 09/21/2017   Lab Results  Component Value Date   HGBA1C 5.6 04/06/2017   Lab Results  Component Value Date   CHOL 145 04/06/2017   HDL 47 04/06/2017   LDLCALC 89 04/06/2017   TRIG 47 04/06/2017   CHOLHDL 3.1 04/06/2017    Significant Diagnostic Results in last 30 days:  No results found.  Assessment/Plan Hypotension with Weakness Will check Stat CBC,CMP and UA Discontinue Hydralazine and Cozaar for now Discontinue THerapy  Chronic kidney disease, stage III (moderate) (HCC) Repeat BMP  Other issues Abdominal aortic aneurysm (AAA) without rupture (HCC) Last Measurment was 9.2 cm Not surgical candidate Benign prostatic hyperplasia with urinary hesitancy Stable on Proscar Neuropathic pain Stable on Neurontin  Addendum His Labs all came back BUN/Creat 46/1.28White Count is normal Hgb is 9.6 UA is pending I D/w the Nurse in charge that this can be his General Decline due to age  No therapy right now as patient says he is not interested Possible Higher level of care .   Family/ staff Communication:   Labs/tests ordered:

## 2018-11-04 DIAGNOSIS — Z0189 Encounter for other specified special examinations: Secondary | ICD-10-CM | POA: Diagnosis not present

## 2018-11-08 ENCOUNTER — Non-Acute Institutional Stay: Payer: Medicare PPO | Admitting: Internal Medicine

## 2018-11-08 ENCOUNTER — Encounter: Payer: Self-pay | Admitting: Internal Medicine

## 2018-11-08 DIAGNOSIS — N183 Chronic kidney disease, stage 3 unspecified: Secondary | ICD-10-CM

## 2018-11-08 DIAGNOSIS — E039 Hypothyroidism, unspecified: Secondary | ICD-10-CM | POA: Diagnosis not present

## 2018-11-08 DIAGNOSIS — I10 Essential (primary) hypertension: Secondary | ICD-10-CM

## 2018-11-08 NOTE — Progress Notes (Signed)
Location: [facility] Room Number: 32 Place of Service:  ALF (13)  Provider:   Code Status:  Goals of Care:  Advanced Directives 11/03/2018  Does Patient Have a Medical Advance Directive? Yes  Type of Estate agent of [place];Living will;Out of facility DNR (pink MOST or yellow form)  Does patient want to make changes to medical advance directive? No - Patient declined  Copy of Healthcare Power of Attorney in Chart? Yes - validated most recent copy scanned in chart (See row information)  Would patient like information on creating a medical advance directive? -  Pre-existing out of facility DNR order (yellow form or pink MOST form) Yellow form placed in chart (order not valid for inpatient use)     Chief Complaint  Patient presents with  . Acute Visit    C/o- Fluctuating BP     HPI: Patient is a 85 y.o. male seen today for an acute visit for Mild Hypertension  Patient is a resident in [facility] He has a history of AAA with measurement of 9.3 cm in CT scanin07/19,hypertension, peripheral neuropathy, BPH, hypothyroid, GERD Patient was seen last week as nurses had noticed patient was feeling weak and tired and his systolic blood pressure was 80-90. I had discontinued his Cozaar and hydralazine. His repeat blood test and a urine was normal But since yesterday his systolic blood pressure is now up to 130-140. Patient is very hard of hearing. He was working with therapy today.  Denied any shortness of breath chest pain or dizziness   Past Medical History:  Diagnosis Date  . AAA (abdominal aortic aneurysm) (HCC)   . Abnormality of gait 08/01/2010  . Adhesive capsulitis of shoulder 08/01/2010   "Frozen" right shoulder  . Anemia   . Anxiety state, unspecified 08/01/2010  . Arthritis   . Congestive heart failure, unspecified 08/01/2010  . Cough 08/07/2015  . Disorders of bursae and tendons in shoulder region, unspecified 08/01/2010   . Edema 08/01/2010  . Fall   . Fecal impaction (HCC)   . GERD (gastroesophageal reflux disease)   . Hiatal hernia   . Hypercalcemia   . Hyperlipidemia   . Hypertension   . Hypertrophy of prostate without urinary obstruction and other lower urinary tract symptoms (LUTS) 08/01/2010  . Hyponatremia   . Hypothyroidism   . Infectious colitis   . Insomnia, unspecified 08/01/2010  . Joint pain   . Leg pain    with walking  . Lumbago 12/24/2010  . Orthostatic hypotension 11/05/2010  . Other specified cardiac dysrhythmias(427.89) 12/24/2010  . Pain in joint, pelvic region and thigh 08/06/2010  . Peptic ulcer, unspecified site, unspecified as acute or chronic, without mention of hemorrhage, perforation, or obstruction 08/01/2010  . Renal failure, acute (HCC)   . Swelling, mass, or lump in head and neck 01/03/2011  . Ulcer    Stomach  . Unspecified disorder of kidney and ureter 08/01/2010  . Unspecified hearing loss 08/01/2010  . Unspecified vitamin D deficiency 08/06/2010  . Urinary frequency 09/13/2010  . Vertigo     Past Surgical History:  Procedure Laterality Date  . ABDOMINAL AORTIC ANEURYSM REPAIR  12-18-10   Stent graft repair of AAA  . APPENDECTOMY    . CATARACT EXTRACTION    . HEMORRHOID SURGERY  2000  . INGUINAL HERNIA REPAIR Left 2005  . TRANSTHORACIC ECHOCARDIOGRAM  07/19/2010    Left ventricle: There is hypokinesis of the inferior wall and  posterior wall. The  EF is 35-40%. The cavity size was normal    Allergies  Allergen Reactions  . Aleve [Naproxen] Other (See Comments)    Per MAR  . Indocin [Indomethacin] Other (See Comments)    Per [facility]    Outpatient Encounter Medications as of 11/08/2018  Medication Sig  . acetaminophen (TYLENOL) 500 MG tablet Take 1,000 mg by mouth 2 (two) times daily.   Marland Kitchen acetaminophen (TYLENOL) 500 MG tablet Take 500 mg by mouth every 8 (eight) hours as needed.   Marland Kitchen alum & mag hydroxide-simeth (MINTOX) 161-096-04 MG/5ML suspension Take 30 mLs by  mouth every 4 (four) hours as needed for indigestion or heartburn. PRN indigestion/stomach upset x 48 hours. Notify MD if symptoms continue. DO NOT USE FOR CHRONIC KIDNEY DISEASE STAGE FOUR OR HEMODIALYSIS PATIENTS.  . bisacodyl (DULCOLAX) 10 MG suppository Place 10 mg rectally every other day. For constipation Hold for loose stools  . Cholecalciferol 25 MCG (1000 UT) tablet Take 2,000 Units by mouth daily.   . cloNIDine (CATAPRES) 0.1 MG tablet Take 0.1 mg by mouth 2 (two) times daily as needed.  Marland Kitchen Dextromethorphan-Guaifenesin (TUSSIN COUGH DM PO) Take 5 mLs by mouth every 4 (four) hours as needed.  . finasteride (PROSCAR) 5 MG tablet Take 5 mg by mouth daily. Women who are pregnant or may become pregnant should use precautions when handling this medication.  . fluocinonide (LIDEX) 0.05 % external solution Apply 1 application topically as needed. apply to scalp for itching  . fluticasone (FLONASE) 50 MCG/ACT nasal spray Place 2 sprays into both nostrils daily.  Marland Kitchen gabapentin (NEURONTIN) 100 MG capsule Take 100 mg by mouth daily.  . hydrALAZINE (APRESOLINE) 10 MG tablet Take 10 mg by mouth 2 (two) times daily.  . hydrocortisone 1 % lotion Apply 1 application topically 2 (two) times daily as needed for itching.   Marland Kitchen ketoconazole (NIZORAL) 2 % shampoo Apply 1 application topically once a week. On Tues.  Marland Kitchen levothyroxine (SYNTHROID, LEVOTHROID) 50 MCG tablet Take 50 mcg by mouth daily before breakfast.   . Lidocaine (ASPERCREME LIDOCAINE) 4 % PTCH Apply 1 patch topically See admin instructions. 1 patch applied once a day to mid lower back  . magic mouthwash w/lidocaine SOLN Take 5-10 mLs by mouth every 4 (four) hours as needed for mouth pain.  . magnesium citrate SOLN Take by mouth as needed for severe constipation. Per ED physician resident may take 10 oz by mouth followed by 8 oz of water as needed every 4th day if no bowel movement in 3 days  . Misc. Devices (SLEEPRIGHT BREATHE AID) MISC 1 strip at  bedtime.  Marland Kitchen omeprazole (PRILOSEC OTC) 20 MG tablet Take 20 mg by mouth daily.  Marland Kitchen oxyCODONE (OXYCONTIN) 10 mg 12 hr tablet Take 1 tablet (10 mg total) by mouth daily. At 6am. Do Not Crush  . polyethylene glycol (MIRALAX / GLYCOLAX) packet Take 17 g by mouth 2 (two) times daily. Mix in 4-8 ounces of fluids for constipation,Hold for loose stools  . polyvinyl alcohol (LIQUIFILM TEARS) 1.4 % ophthalmic solution Place 1 drop into both eyes 2 (two) times daily.  . sennosides-docusate sodium (SENOKOT-S) 8.6-50 MG tablet Take 2 tablets by mouth at bedtime.  . sodium chloride (OCEAN) 0.65 % SOLN nasal spray Place 1 spray into both nostrils 3 (three) times daily as needed for congestion.  . triamcinolone (KENALOG) 0.1 % paste Use as directed 1 application in the mouth or throat every 4 (four) hours as needed.  Marland Kitchen DEXTRAN 70-HYPROMELLOSE,  PF, OP Place 1 drop into both eyes as needed (for dry eyes). wait 3-5 minutes between eye drops  . [DISCONTINUED] hydrALAZINE (APRESOLINE) 25 MG tablet Take 25 mg by mouth 2 (two) times daily.  . [DISCONTINUED] losartan (COZAAR) 25 MG tablet Take 25 mg by mouth daily. notify MD if SBP greater than 140   No facility-administered encounter medications on file as of 11/08/2018.     Review of Systems:  Review of Systems  Constitutional: Positive for activity change.  HENT: Negative.   Respiratory: Negative.   Cardiovascular: Negative.   Gastrointestinal: Negative.   Genitourinary: Negative.   Musculoskeletal: Negative.   Neurological: Positive for weakness.  Psychiatric/Behavioral: Negative.     Health Maintenance  Topic Date Due  . INFLUENZA VACCINE  10/09/2018  . TETANUS/TDAP  11/01/2026  . PNA vac Low Risk Adult  Completed    Physical Exam: Vitals:   11/08/18 1740  BP: 140/60  Pulse: 78  Resp: 20  Temp: 97.9 F (36.6 C)  SpO2: 97%  Weight: 132 lb 6.4 oz (60.1 kg)  Height: 5\' 9"  (1.753 m)   Body mass index is 19.55 kg/m. Physical Exam Vitals signs  reviewed.  HENT:     Head: Normocephalic.     Nose: Nose normal.     Mouth/Throat:     Mouth: Mucous membranes are moist.     Pharynx: Oropharynx is clear.  Eyes:     Pupils: Pupils are equal, round, and reactive to light.  Cardiovascular:     Rate and Rhythm: Normal rate and regular rhythm.     Pulses: Normal pulses.     Heart sounds: Normal heart sounds.  Pulmonary:     Effort: Pulmonary effort is normal.     Breath sounds: Normal breath sounds.  Abdominal:     General: Abdomen is flat. Bowel sounds are normal.     Palpations: Abdomen is soft.  Musculoskeletal:        General: No swelling.  Skin:    General: Skin is warm and dry.  Neurological:     General: No focal deficit present.     Mental Status: He is alert.     Labs reviewed: Basic Metabolic Panel: Recent Labs    07/08/18 07/22/18 08/19/18  NA 135* 135* 136*  K 5.6* 5.0 5.0  BUN 52* 50* 54*  CREATININE 1.3 1.3 1.3   Liver Function Tests: No results for input(s): AST, ALT, ALKPHOS, BILITOT, PROT, ALBUMIN in the last 8760 hours. No results for input(s): LIPASE, AMYLASE in the last 8760 hours. No results for input(s): AMMONIA in the last 8760 hours. CBC: Recent Labs    01/28/18 01/31/18 07/08/18  WBC 6.4 7.1 6.5  HGB 9.9* 9.8* 30.1*  HCT 30* 30* 90*  PLT 191 198 185   Lipid Panel: No results for input(s): CHOL, HDL, LDLCALC, TRIG, CHOLHDL, LDLDIRECT in the last 8760 hours. Lab Results  Component Value Date   HGBA1C 5.6 04/06/2017    Procedures since last visit: No results found.  Assessment/Plan Fluctuating Hypertension Blood pressure systolic today was more than 130 With his history of aortic aneurysm will restart his hydralazine at a low dose of 10 mg twice daily Continue to monitor his blood pressure closely.  Chronic kidney disease, stage III (moderate) (HCC) Repeat BMP showed BUN/Creat was 46/1.28 with Hgb of 9.6  Other issues Abdominal aortic aneurysm (AAA) without rupture (HCC) Last  Measurment was 9.2 cm Not surgical candidate Benign prostatic hyperplasia with urinary hesitancy Stable on  Proscar Neuropathic pain Stable on Neurontin    Labs/tests ordered:  * No order type specified * Next appt:  Visit date not found Total time spent in this patient care encounter was  _25  minutes; greater than 50% of the visit spent counseling patient and staff, reviewing records , Labs and coordinating care for problems addressed at this encounter.

## 2018-11-12 DIAGNOSIS — M79672 Pain in left foot: Secondary | ICD-10-CM | POA: Diagnosis not present

## 2018-11-12 DIAGNOSIS — B351 Tinea unguium: Secondary | ICD-10-CM | POA: Diagnosis not present

## 2018-11-12 DIAGNOSIS — M2041 Other hammer toe(s) (acquired), right foot: Secondary | ICD-10-CM | POA: Diagnosis not present

## 2018-11-12 DIAGNOSIS — M79671 Pain in right foot: Secondary | ICD-10-CM | POA: Diagnosis not present

## 2018-11-12 DIAGNOSIS — M2042 Other hammer toe(s) (acquired), left foot: Secondary | ICD-10-CM | POA: Diagnosis not present

## 2018-11-30 ENCOUNTER — Other Ambulatory Visit: Payer: Self-pay | Admitting: *Deleted

## 2018-11-30 DIAGNOSIS — M792 Neuralgia and neuritis, unspecified: Secondary | ICD-10-CM

## 2018-11-30 MED ORDER — OXYCODONE HCL ER 10 MG PO T12A: 10.0000 mg | EXTENDED_RELEASE_TABLET | Freq: Every day | ORAL | 0 refills | Status: DC

## 2018-11-30 NOTE — Telephone Encounter (Signed)
Refill Request from [facility] Pended Rx and sent to Dr. Gupta for approval.  

## 2018-12-27 ENCOUNTER — Non-Acute Institutional Stay: Payer: Medicare PPO | Admitting: Internal Medicine

## 2018-12-27 ENCOUNTER — Encounter: Payer: Self-pay | Admitting: Internal Medicine

## 2018-12-27 DIAGNOSIS — R001 Bradycardia, unspecified: Secondary | ICD-10-CM | POA: Diagnosis not present

## 2018-12-27 DIAGNOSIS — E039 Hypothyroidism, unspecified: Secondary | ICD-10-CM | POA: Diagnosis not present

## 2018-12-27 DIAGNOSIS — I714 Abdominal aortic aneurysm, without rupture, unspecified: Secondary | ICD-10-CM

## 2018-12-27 DIAGNOSIS — I10 Essential (primary) hypertension: Secondary | ICD-10-CM | POA: Diagnosis not present

## 2018-12-27 LAB — CHLORIDE
Albumin: 3.7
Calcium: 8.5
Calcium: 8.6
Carbon Dioxide, Total: 18
Carbon Dioxide, Total: 21
Chloride: 107
Chloride: 109
Globulin: 2.5
Total Protein: 6.2 — AB (ref 6.4–8.2)

## 2018-12-27 NOTE — Progress Notes (Signed)
Location:    [facility]ursing Home Room Number: 32 Place of Service:  ALF [phone]) Provider:  [name], MD  [name], MD  Patient Care Team: [name], MD as PCP - General (Internal Medicine) Mast, Man X, NP as Nurse Practitioner (Internal Medicine)  Extended Emergency Contact Information Primary Emergency Contact: [name], [place] Home Phone: [phone] Mobile Phone: [phone] Relation: Son Secondary Emergency Contact: [name] [place] Mobile Phone: [phone] Relation: Grandson  Code Status:  DNR Goals of care: Advanced Directive information Advanced Directives 12/27/2018  Does Patient Have a Medical Advance Directive? Yes  Type of Advance Directive Out of facility DNR (pink MOST or yellow form);Living will;Healthcare Power of Attorney  Does patient want to make changes to medical advance directive? No - Patient declined  Copy of Healthcare Power of Attorney in Chart? Yes - validated most recent copy scanned in chart (See row information)  Would patient like information on creating a medical advance directive? -  Pre-existing out of facility DNR order (yellow form or pink MOST form) Yellow form placed in chart (order not valid for inpatient use)     Chief Complaint  Patient presents with  . Medical Management of Chronic Issues    HPI:  Pt is a 85 y.o. male seen today for medical management of chronic diseases.   Patient is a resident in [facility] He has a history of AAA with measurement of 9.3 cm in CT scan in 07/ 19, hypertension, peripheral neuropathy, BPH, hypothyroid, GERD  Patient is very hard of hearing.  Still independent in his ADLs.  Able to do his transfers from his power chair..  Did not have any acute complaints.  He says he sometimes feels down because now he is also having problems with his vision Weight is stable no new nursing issues  Past Medical History:   Diagnosis Date  . AAA (abdominal aortic aneurysm) (HCC)   . Abnormality of gait 08/01/2010  . Adhesive capsulitis of shoulder 08/01/2010   "Frozen" right shoulder  . Anemia   . Anxiety state, unspecified 08/01/2010  . Arthritis   . Congestive heart failure, unspecified 08/01/2010  . Cough 08/07/2015  . Disorders of bursae and tendons in shoulder region, unspecified 08/01/2010  . Edema 08/01/2010  . Fall   . Fecal impaction (HCC)   . GERD (gastroesophageal reflux disease)   . Hiatal hernia   . Hypercalcemia   . Hyperlipidemia   . Hypertension   . Hypertrophy of prostate without urinary obstruction and other lower urinary tract symptoms (LUTS) 08/01/2010  . Hyponatremia   . Hypothyroidism   . Infectious colitis   . Insomnia, unspecified 08/01/2010  . Joint pain   . Leg pain    with walking  . Lumbago 12/24/2010  . Orthostatic hypotension 11/05/2010  . Other specified cardiac dysrhythmias(427.89) 12/24/2010  . Pain in joint, pelvic region and thigh 08/06/2010  . Peptic ulcer, unspecified site, unspecified as acute or chronic, without mention of hemorrhage, perforation, or obstruction 08/01/2010  . Renal failure, acute (HCC)   . Swelling, mass, or lump in head and neck 01/03/2011  . Ulcer    Stomach  . Unspecified disorder of kidney and ureter 08/01/2010  . Unspecified hearing loss 08/01/2010  . Unspecified vitamin D deficiency 08/06/2010  . Urinary frequency 09/13/2010  . Vertigo    Past Surgical History:  Procedure Laterality Date  .  ABDOMINAL AORTIC ANEURYSM REPAIR  12-18-10   Stent graft repair of AAA  . APPENDECTOMY    . CATARACT EXTRACTION    . HEMORRHOID SURGERY  2000  . INGUINAL HERNIA REPAIR Left 2005  . TRANSTHORACIC ECHOCARDIOGRAM  07/19/2010    Left ventricle: There is hypokinesis of the inferior wall and  posterior wall. The EF is 35-40%. The cavity size was normal    Allergies  Allergen Reactions  . Aleve [Naproxen] Other (See Comments)    Per MAR  . Indocin  [Indomethacin] Other (See Comments)    Per MAR    Allergies as of 12/27/2018      Reactions   Aleve [naproxen] Other (See Comments)   Per MAR   Indocin [indomethacin] Other (See Comments)   Per [facility]      Medication List       Accurate as of December 27, 2018  2:27 PM. If you have any questions, ask your nurse or doctor.        STOP taking these medications   cloNIDine 0.1 MG tablet Commonly known as: CATAPRES Stopped by: Virgie Dad, MD   polyvinyl alcohol 1.4 % ophthalmic solution Commonly known as: LIQUIFILM TEARS Stopped by: Virgie Dad, MD     TAKE these medications   acetaminophen 500 MG tablet Commonly known as: TYLENOL Take 1,000 mg by mouth 2 (two) times daily.   acetaminophen 500 MG tablet Commonly known as: TYLENOL Take 500 mg by mouth every 8 (eight) hours as needed.   Aspercreme Lidocaine 4 % Ptch Generic drug: Lidocaine Apply 1 patch topically See admin instructions. 1 patch applied once a day to mid lower back   benzocaine 10 % mucosal gel Commonly known as: ORAJEL 1 application. May keep at bedside and self-administer. Apply with cotton applicator to mouth and/or throat for mouth pain as needed.   bisacodyl 10 MG suppository Commonly known as: DULCOLAX Place 10 mg rectally every other day. For constipation Hold for loose stools   Cholecalciferol 25 MCG (1000 UT) tablet Take 2,000 Units by mouth daily.   DEXTRAN 70-HYPROMELLOSE (PF) OP Place 1 drop into both eyes as needed (for dry eyes). wait 3-5 minutes between eye drops   finasteride 5 MG tablet Commonly known as: PROSCAR Take 5 mg by mouth daily. Women who are pregnant or may become pregnant should use precautions when handling this medication.   fluocinonide 0.05 % external solution Commonly known as: LIDEX Apply 1 application topically as needed. apply to scalp for itching   fluticasone 50 MCG/ACT nasal spray Commonly known as: FLONASE Place 2 sprays into both nostrils daily.    gabapentin 100 MG capsule Commonly known as: NEURONTIN Take 100 mg by mouth daily.   hydrALAZINE 10 MG tablet Commonly known as: APRESOLINE Take 10 mg by mouth 2 (two) times daily.   hydrocortisone 1 % lotion Apply 1 application topically 2 (two) times daily as needed for itching.   ketoconazole 2 % shampoo Commonly known as: NIZORAL Apply 1 application topically once a week. On Tues.   levothyroxine 50 MCG tablet Commonly known as: SYNTHROID Take 50 mcg by mouth daily before breakfast. What changed: Another medication with the same name was removed. Continue taking this medication, and follow the directions you see here. Changed by: Virgie Dad, MD   magic mouthwash w/lidocaine Soln Take 5-10 mLs by mouth every 4 (four) hours as needed for mouth pain.   magnesium citrate Soln Take by mouth as needed for severe  constipation. Per ED physician resident may take 10 oz by mouth followed by 8 oz of water as needed every 4th day if no bowel movement in 3 days   Mintox 200-200-20 MG/5ML suspension Generic drug: alum & mag hydroxide-simeth Take 30 mLs by mouth every 4 (four) hours as needed for indigestion or heartburn. PRN indigestion/stomach upset x 48 hours. Notify MD if symptoms continue. DO NOT USE FOR CHRONIC KIDNEY DISEASE STAGE FOUR OR HEMODIALYSIS PATIENTS.   OcuSoft Eyelid Cleansing Pads Apply topically. 1 pad to each eye once a morning   omeprazole 20 MG tablet Commonly known as: PRILOSEC OTC Take 20 mg by mouth daily.   oxyCODONE 10 mg 12 hr tablet Commonly known as: OxyCONTIN Take 1 tablet (10 mg total) by mouth daily. At 6am. Do Not Crush   polyethylene glycol 17 g packet Commonly known as: MIRALAX / GLYCOLAX Take 17 g by mouth 2 (two) times daily. Mix in 4-8 ounces of fluids for constipation,Hold for loose stools   sennosides-docusate sodium 8.6-50 MG tablet Commonly known as: SENOKOT-S Take 2 tablets by mouth at bedtime.   SleepRight Breathe Aid Misc 1  strip at bedtime.   sodium chloride 0.65 % Soln nasal spray Commonly known as: OCEAN Place 1 spray into both nostrils 3 (three) times daily as needed for congestion.   triamcinolone 0.1 % paste Commonly known as: KENALOG Use as directed 1 application in the mouth or throat every 4 (four) hours as needed.   TUSSIN COUGH DM PO Take 5 mLs by mouth every 4 (four) hours as needed.       Review of Systems  Review of Systems  Constitutional: Negative for activity change, appetite change, chills, diaphoresis, fatigue and fever.  HENT: Negative for mouth sores, postnasal drip, rhinorrhea, sinus pain and sore throat.   Respiratory: Negative for apnea, cough, chest tightness, shortness of breath and wheezing.   Cardiovascular: Negative for chest pain, palpitations and leg swelling.  Gastrointestinal: Negative for abdominal distention, abdominal pain, constipation, diarrhea, nausea and vomiting.  Genitourinary: Negative for dysuria and frequency.  Musculoskeletal: Negative for arthralgias, joint swelling and myalgias.  Skin: Negative for rash.  Neurological: Negative for dizziness, syncope, weakness, light-headedness and numbness.  Psychiatric/Behavioral: Negative for behavioral problems, confusion and sleep disturbance.     Immunization History  Administered Date(s) Administered  . Influenza-Unspecified 12/27/2010, 12/29/2011, 12/27/2012, 12/13/2013, 12/14/2014, 12/20/2015, 12/17/2016, 12/14/2017  . PPD Test 07/30/2010, 08/14/2010  . Pneumococcal Conjugate-13 11/04/2016  . Pneumococcal Polysaccharide-23 03/10/2005  . Td 03/11/1999  . Tdap 10/31/2016   Pertinent  Health Maintenance Due  Topic Date Due  . INFLUENZA VACCINE  10/09/2018  . PNA vac Low Risk Adult  Completed   Fall Risk  11/04/2017 10/29/2016 08/07/2015 08/07/2015 04/10/2015  Falls in the past year? No Yes Yes No No  Comment - - 06/02/15- skin tear right arm - -  Number falls in past yr: - 1 - - -  Injury with Fall? - No -  - -   Functional Status Survey:    Vitals:   12/27/18 1414  BP: 115/64  Pulse: (!) 50  Resp: 18  Temp: (!) 97.3 F (36.3 C)  SpO2: 95%  Weight: 135 lb 3.2 oz (61.3 kg)  Height: 5\' 9"  (1.753 m)   Body mass index is 19.97 kg/m. Physical Exam  Constitutional:  Well-developed and well-nourished.  HENT:  Head: Normocephalic.  Mouth/Throat: Oropharynx is clear and moist.  Eyes: Pupils are equal, round, and reactive to light.  Neck:  Neck supple.  Cardiovascular: Normal rate and normal heart sounds.  No murmur heard. Pulmonary/Chest: Effort normal and breath sounds normal. No respiratory distress. No wheezes. She has no rales.  Abdominal: Soft. Bowel sounds are normal. No distension. There is no tenderness. There is no rebound.  Musculoskeletal: Mild edema Bilateral Lymphadenopathy: none Neurological: Alert No Focal Deficits Skin: Skin is warm and dry.  Psychiatric: Normal mood and affect. Behavior is normal. Thought content normal.    Labs reviewed: Recent Labs    08/19/18 09/30/18 11/03/18  NA 136* 138 135*  K 5.0 5.3 5.0  CL  --  109 107  CO2  --  18 21  BUN 54* 50* 46*  CREATININE 1.3 1.1 1.2  CALCIUM  --  8.6 8.5   Recent Labs    11/03/18  AST 14  ALT 6*  ALKPHOS 132*  PROT 6.2*  ALBUMIN 3.7   Recent Labs    01/31/18 07/08/18 11/03/18  WBC 7.1 6.5 7.2  NEUTROABS  --   --  4,550  HGB 9.8* 30.1* 9.6*  HCT 30* 90* 30*  PLT 198 185 203   Lab Results  Component Value Date   TSH 2.19 09/30/2018   Lab Results  Component Value Date   HGBA1C 5.6 04/06/2017   Lab Results  Component Value Date   CHOL 145 04/06/2017   HDL 47 04/06/2017   LDLCALC 89 04/06/2017   TRIG 47 04/06/2017   CHOLHDL 3.1 04/06/2017    Significant Diagnostic Results in last 30 days:  No results found.  Assessment/Plan Abdominal aortic aneurysm (AAA) without rupture (HCC) Last Measurment was 9.2 cm Nonsurgical candidate Having no issues Essential hypertension Since patient  has hyperkalemia was taken off Angiotensin On Low dose of Hydralazine and Stable  Hypothyroidism  Repeat TSH was normal in 7/20  Chronic kidney disease, stage III (moderate) with Hyperkalemia BUN and Creat has been Stable Benign prostatic hyperplasia with urinary hesitancy Stable on Proscar Neuropathic pain Stable on Neurontin  Bradycardia by electrocardiogram EKG showed Sinus Bradycardia with AV block 1 GERD On Prilosec  Anemia Hgb Low but stable   Family/ staff Communication:   Labs/tests ordered:    Total time spent in this patient care encounter was  _25  minutes; greater than 50% of the visit spent counseling patient and staff, reviewing records , Labs and coordinating care for problems addressed at this encounter.

## 2018-12-31 ENCOUNTER — Other Ambulatory Visit: Payer: Self-pay | Admitting: *Deleted

## 2018-12-31 DIAGNOSIS — M792 Neuralgia and neuritis, unspecified: Secondary | ICD-10-CM

## 2018-12-31 NOTE — Telephone Encounter (Signed)
[facility] Faxed Refill Request Pended Rx and sent to Dr. Lyndel Safe for approval.

## 2019-01-01 MED ORDER — OXYCODONE HCL ER 10 MG PO T12A: 10.0000 mg | EXTENDED_RELEASE_TABLET | Freq: Every day | ORAL | 0 refills | Status: DC

## 2019-01-18 ENCOUNTER — Encounter: Payer: Self-pay | Admitting: Nurse Practitioner

## 2019-01-18 ENCOUNTER — Non-Acute Institutional Stay: Payer: Medicare PPO | Admitting: Nurse Practitioner

## 2019-01-18 DIAGNOSIS — I10 Essential (primary) hypertension: Secondary | ICD-10-CM | POA: Diagnosis not present

## 2019-01-18 DIAGNOSIS — N1831 Chronic kidney disease, stage 3a: Secondary | ICD-10-CM

## 2019-01-18 DIAGNOSIS — D638 Anemia in other chronic diseases classified elsewhere: Secondary | ICD-10-CM | POA: Diagnosis not present

## 2019-01-18 NOTE — Assessment & Plan Note (Addendum)
Low blood pressure measurements, will dc Hydralazine, monitor Bp bid x 1 wk, then 2x/wk. Re-eval if Bp>140/90

## 2019-01-18 NOTE — Progress Notes (Signed)
Location:   [facility] Room Number: 71 Place of Service:  [facility] (13) Provider:  Glenwood Revoir NP  Virgie Dad, MD  Patient Care Team: Virgie Dad, MD as PCP - General (Internal Medicine) Joretta Eads X, NP as Nurse Practitioner (Internal Medicine)  Extended Emergency Contact Information Primary Emergency Contact: Annia Belt, [place] Phone: [phone] Mobile Phone: [phone] Relation: Son Secondary Emergency Contact: Larence Penning [place] Mobile Phone: [phone] Relation: Grandson  Code Status:  DNR Goals of care: Advanced Directive information Advanced Directives 12/27/2018  Does Patient Have a Medical Advance Directive? Yes  Type of Advance Directive Out of facility DNR (pink MOST or yellow form);Living will;Healthcare Power of Attorney  Does patient want to make changes to medical advance directive? No - Patient declined  Copy of [place] in Chart? Yes - validated most recent copy scanned in chart (See row information)  Would patient like information on creating a medical advance directive? -  Pre-existing out of facility DNR order (yellow form or pink MOST form) Yellow form placed in chart (order not valid for inpatient use)     Chief Complaint  Patient presents with  . Acute Visit    Blood Pressure    HPI:  Pt is a 85 y.o. male seen today for an acute visit for low blood pressure measurements, taking Hydralazine 10mg  bid. The patient stated he experienced dizziness when blood pressure is low, but denied change of vision, chest pain/pressure, or palpitation. CKD, baseline creat 1.1-1.3, anemia, Hgb 9s.    Past Medical History:  Diagnosis Date  . AAA (abdominal aortic aneurysm) ([place])   . Abnormality of gait 08/01/2010  . Adhesive capsulitis of shoulder 08/01/2010   "Frozen" right shoulder  . Anemia   . Anxiety state, unspecified 08/01/2010  . Arthritis   .  Congestive heart failure, unspecified 08/01/2010  . Cough 08/07/2015  . Disorders of bursae and tendons in shoulder region, unspecified 08/01/2010  . Edema 08/01/2010  . Fall   . Fecal impaction ([place])   . GERD (gastroesophageal reflux disease)   . Hiatal hernia   . Hypercalcemia   . Hyperlipidemia   . Hypertension   . Hypertrophy of prostate without urinary obstruction and other lower urinary tract symptoms (LUTS) 08/01/2010  . Hyponatremia   . Hypothyroidism   . Infectious colitis   . Insomnia, unspecified 08/01/2010  . Joint pain   . Leg pain    with walking  . Lumbago 12/24/2010  . Orthostatic hypotension 11/05/2010  . Other specified cardiac dysrhythmias(427.89) 12/24/2010  . Pain in joint, pelvic region and thigh 08/06/2010  . Peptic ulcer, unspecified site, unspecified as acute or chronic, without mention of hemorrhage, perforation, or obstruction 08/01/2010  . Renal failure, acute ([place])   . Swelling, mass, or lump in head and neck 01/03/2011  . Ulcer    Stomach  . Unspecified disorder of kidney and ureter 08/01/2010  . Unspecified hearing loss 08/01/2010  . Unspecified vitamin D deficiency 08/06/2010  . Urinary frequency 09/13/2010  . Vertigo    Past Surgical History:  Procedure Laterality Date  . ABDOMINAL AORTIC ANEURYSM REPAIR  12-18-10   Stent graft repair of AAA  . APPENDECTOMY    . CATARACT EXTRACTION    . HEMORRHOID SURGERY  2000  . INGUINAL HERNIA REPAIR Left 2005  . TRANSTHORACIC ECHOCARDIOGRAM  07/19/2010    Left ventricle: There  is hypokinesis of the inferior wall and  posterior wall. The EF is 35-40%. The cavity size was normal    Allergies  Allergen Reactions  . Aleve [Naproxen] Other (See Comments)    Per [facility]  . Indocin [Indomethacin] Other (See Comments)    Per [facility]    Allergies as of 01/18/2019      Reactions   Aleve [naproxen] Other (See Comments)   Per [facility]   Indocin [indomethacin] Other (See Comments)   Per [facility]      Medication List        Accurate as of January 18, 2019  1:42 PM. If you have any questions, ask your nurse or doctor.        acetaminophen 500 MG tablet Commonly known as: TYLENOL Take 1,000 mg by mouth 2 (two) times daily.   acetaminophen 500 MG tablet Commonly known as: TYLENOL Take 500 mg by mouth every 8 (eight) hours as needed.   Aspercreme Lidocaine 4 % Ptch Generic drug: Lidocaine Apply 1 patch topically See admin instructions. 1 patch applied once a day to mid lower back   benzocaine 10 % mucosal gel Commonly known as: ORAJEL 1 application. May keep at bedside and self-administer. Apply with cotton applicator to mouth and/or throat for mouth pain as needed.   bisacodyl 10 MG suppository Commonly known as: DULCOLAX Place 10 mg rectally every other day. For constipation Hold for loose stools   Cholecalciferol 25 MCG (1000 UT) tablet Take 2,000 Units by mouth daily.   DEXTRAN 70-HYPROMELLOSE (PF) OP Place 1 drop into both eyes as needed (for dry eyes). wait 3-5 minutes between eye drops   finasteride 5 MG tablet Commonly known as: PROSCAR Take 5 mg by mouth daily. Women who are pregnant or may become pregnant should use precautions when handling this medication.   fluocinonide 0.05 % external solution Commonly known as: LIDEX Apply 1 application topically as needed. apply to scalp for itching   fluticasone 50 MCG/ACT nasal spray Commonly known as: FLONASE Place 2 sprays into both nostrils daily.   gabapentin 100 MG capsule Commonly known as: NEURONTIN Take 100 mg by mouth daily.   hydrALAZINE 10 MG tablet Commonly known as: APRESOLINE Take 10 mg by mouth 2 (two) times daily.   hydrocortisone 1 % lotion Apply 1 application topically 2 (two) times daily as needed for itching.   ketoconazole 2 % shampoo Commonly known as: NIZORAL Apply 1 application topically once a week. On Tues.   levothyroxine 50 MCG tablet Commonly known as: SYNTHROID Take 50 mcg by mouth daily before  breakfast.   magic mouthwash w/lidocaine Soln Take 5-10 mLs by mouth every 4 (four) hours as needed for mouth pain.   magnesium citrate Soln Take by mouth as needed for severe constipation. Per ED physician resident may take 10 oz by mouth followed by 8 oz of water as needed every 4th day if no bowel movement in 3 days   Mintox 200-200-20 MG/5ML suspension Generic drug: alum & mag hydroxide-simeth Take 30 mLs by mouth every 4 (four) hours as needed for indigestion or heartburn. PRN indigestion/stomach upset x 48 hours. Notify MD if symptoms continue. DO NOT USE FOR CHRONIC KIDNEY DISEASE STAGE FOUR OR HEMODIALYSIS PATIENTS.   OcuSoft Eyelid Cleansing Pads Apply topically. 1 pad to each eye once a morning   omeprazole 20 MG tablet Commonly known as: PRILOSEC OTC Take 20 mg by mouth daily.   oxyCODONE 10 mg 12 hr tablet Commonly known as: OxyCONTIN  Take 1 tablet (10 mg total) by mouth daily. At 6am. Do Not Crush   polyethylene glycol 17 g packet Commonly known as: MIRALAX / GLYCOLAX Take 17 g by mouth 2 (two) times daily. Mix in 4-8 ounces of fluids for constipation,Hold for loose stools   sennosides-docusate sodium 8.6-50 MG tablet Commonly known as: SENOKOT-S Take 2 tablets by mouth at bedtime.   Shingrix injection Generic drug: Zoster Vaccine Adjuvanted Inject 0.5 mLs into the muscle once. Start taking on: May 06, 2019   SleepRight Breathe Aid Misc 1 strip at bedtime.   sodium chloride 0.65 % Soln nasal spray Commonly known as: OCEAN Place 1 spray into both nostrils 3 (three) times daily as needed for congestion.   triamcinolone 0.1 % paste Commonly known as: KENALOG Use as directed 1 application in the mouth or throat every 4 (four) hours as needed.   TUSSIN COUGH DM PO Take 5 mLs by mouth every 4 (four) hours as needed.       Review of Systems  Constitutional: Negative for activity change, appetite change, chills, diaphoresis, fatigue, fever and  unexpected weight change.  HENT: Positive for congestion and hearing loss. Negative for voice change.   Respiratory: Negative for cough, shortness of breath and wheezing.   Cardiovascular: Negative for chest pain and leg swelling.  Gastrointestinal: Negative for abdominal distention, abdominal pain, constipation, diarrhea, nausea and vomiting.  Genitourinary: Positive for urgency. Negative for difficulty urinating and dysuria.  Musculoskeletal: Positive for arthralgias and gait problem.  Skin: Negative for color change and pallor.  Neurological: Positive for dizziness. Negative for tremors, syncope, facial asymmetry, speech difficulty, weakness and headaches.  Psychiatric/Behavioral: Negative for agitation, behavioral problems, hallucinations and sleep disturbance. The patient is not nervous/anxious.     Immunization History  Administered Date(s) Administered  . Influenza-Unspecified 12/27/2010, 12/29/2011, 12/27/2012, 12/13/2013, 12/14/2014, 12/20/2015, 12/17/2016, 12/14/2017  . PPD Test 07/30/2010, 08/14/2010  . Pneumococcal Conjugate-13 11/04/2016  . Pneumococcal Polysaccharide-23 03/10/2005  . Td 03/11/1999  . Tdap 10/31/2016   Pertinent  Health Maintenance Due  Topic Date Due  . INFLUENZA VACCINE  Completed  . PNA vac Low Risk Adult  Completed   Fall Risk  11/04/2017 10/29/2016 08/07/2015 08/07/2015 04/10/2015  Falls in the past year? No Yes Yes No No  Comment - - 06/02/15- skin tear right arm - -  Number falls in past yr: - 1 - - -  Injury with Fall? - No - - -   Functional Status Survey:    Vitals:   01/18/19 1049  BP: (!) 80/58  Pulse: 68  Resp: 18  Temp: (!) 97 F (36.1 C)  SpO2: 90%  Weight: 133 lb (60.3 kg)  Height: 5\' 9"  (1.753 m)   Body mass index is 19.64 kg/m. Physical Exam Vitals signs and nursing note reviewed.  Constitutional:      General: He is not in acute distress.    Appearance: Normal appearance. He is not ill-appearing, toxic-appearing or  diaphoretic.  HENT:     Head: Normocephalic and atraumatic.     Nose: Nose normal.     Mouth/Throat:     Mouth: Mucous membranes are moist.  Eyes:     Extraocular Movements: Extraocular movements intact.     Conjunctiva/sclera: Conjunctivae normal.     Pupils: Pupils are equal, round, and reactive to light.  Neck:     Musculoskeletal: Normal range of motion and neck supple.  Cardiovascular:     Rate and Rhythm: Normal rate and regular rhythm.  Heart sounds: Murmur present.  Pulmonary:     Breath sounds: Rales present.     Comments: Posterior R basilar rales.  Abdominal:     General: There is no distension.     Palpations: Abdomen is soft.     Tenderness: There is no abdominal tenderness. There is no right CVA tenderness, left CVA tenderness, guarding or rebound.  Musculoskeletal:     Right lower leg: No edema.     Left lower leg: No edema.  Skin:    General: Skin is dry.  Neurological:     General: No focal deficit present.     Mental Status: He is alert and oriented to person, place, and time. Mental status is at baseline.     Cranial Nerves: No cranial nerve deficit.     Motor: No weakness.     Coordination: Coordination normal.     Gait: Gait abnormal.  Psychiatric:        Mood and Affect: Mood normal.        Behavior: Behavior normal.        Thought Content: Thought content normal.        Judgment: Judgment normal.     Labs reviewed: Recent Labs    08/19/18 09/30/18 11/03/18  NA 136* 138 135*  K 5.0 5.3 5.0  CL  --  109 107  CO2  --  18 21  BUN 54* 50* 46*  CREATININE 1.3 1.1 1.2  CALCIUM  --  8.6 8.5   Recent Labs    11/03/18  AST 14  ALT 6*  ALKPHOS 132*  PROT 6.2*  ALBUMIN 3.7   Recent Labs    01/31/18 07/08/18 11/03/18  WBC 7.1 6.5 7.2  NEUTROABS  --   --  4,550  HGB 9.8* 30.1* 9.6*  HCT 30* 90* 30*  PLT 198 185 203   Lab Results  Component Value Date   TSH 2.19 09/30/2018   Lab Results  Component Value Date   HGBA1C 5.6 04/06/2017    Lab Results  Component Value Date   CHOL 145 04/06/2017   HDL 47 04/06/2017   LDLCALC 89 04/06/2017   TRIG 47 04/06/2017   CHOLHDL 3.1 04/06/2017    Significant Diagnostic Results in last 30 days:  No results found.  Assessment/Plan Essential hypertension Low blood pressure measurements, will dc Hydralazine, monitor Bp bid x 1 wk, then 2x/wk. Re-eval if Bp>140/90  Anemia of chronic disease Baseline Hgb 9s, no active bleed.   Chronic kidney disease, stage III (moderate) (HCC) Stable, baseline creat 1.1-1.3.      Family/ staff Communication: plan of care reviewed with the patient and charge nurse.   Labs/tests ordered:  None  Time spend 40 minutes.

## 2019-01-18 NOTE — Assessment & Plan Note (Signed)
Baseline Hgb 9s, no active bleed.

## 2019-01-18 NOTE — Assessment & Plan Note (Signed)
Stable, baseline creat 1.1-1.3.

## 2019-01-24 ENCOUNTER — Other Ambulatory Visit: Payer: Self-pay | Admitting: *Deleted

## 2019-01-24 DIAGNOSIS — M792 Neuralgia and neuritis, unspecified: Secondary | ICD-10-CM

## 2019-01-24 MED ORDER — OXYCODONE HCL ER 10 MG PO T12A: 10.0000 mg | EXTENDED_RELEASE_TABLET | Freq: Every day | ORAL | 0 refills | Status: DC

## 2019-01-24 NOTE — Telephone Encounter (Signed)
Received fax refill Request from [facility] °Pended Rx and sent to Dr. Gupta for approval.  °

## 2019-02-22 DIAGNOSIS — Z20828 Contact with and (suspected) exposure to other viral communicable diseases: Secondary | ICD-10-CM | POA: Diagnosis not present

## 2019-02-25 DIAGNOSIS — B351 Tinea unguium: Secondary | ICD-10-CM | POA: Diagnosis not present

## 2019-02-25 DIAGNOSIS — M79672 Pain in left foot: Secondary | ICD-10-CM | POA: Diagnosis not present

## 2019-02-25 DIAGNOSIS — M79671 Pain in right foot: Secondary | ICD-10-CM | POA: Diagnosis not present

## 2019-02-28 DIAGNOSIS — Z20828 Contact with and (suspected) exposure to other viral communicable diseases: Secondary | ICD-10-CM | POA: Diagnosis not present

## 2019-03-02 ENCOUNTER — Other Ambulatory Visit: Payer: Self-pay | Admitting: *Deleted

## 2019-03-02 DIAGNOSIS — M792 Neuralgia and neuritis, unspecified: Secondary | ICD-10-CM

## 2019-03-02 MED ORDER — OXYCODONE HCL ER 10 MG PO T12A: 10.0000 mg | EXTENDED_RELEASE_TABLET | Freq: Every day | ORAL | 0 refills | Status: DC

## 2019-03-02 NOTE — Telephone Encounter (Signed)
Received fax refill from [facility] and sent to Dr. Lyndel Safe for approval.

## 2019-03-08 DIAGNOSIS — Z20828 Contact with and (suspected) exposure to other viral communicable diseases: Secondary | ICD-10-CM | POA: Diagnosis not present

## 2019-03-10 ENCOUNTER — Non-Acute Institutional Stay: Payer: Medicare PPO | Admitting: Nurse Practitioner

## 2019-03-10 ENCOUNTER — Encounter: Payer: Self-pay | Admitting: Nurse Practitioner

## 2019-03-10 DIAGNOSIS — Y92129 Unspecified place in nursing home as the place of occurrence of the external cause: Secondary | ICD-10-CM

## 2019-03-10 DIAGNOSIS — G311 Senile degeneration of brain, not elsewhere classified: Secondary | ICD-10-CM | POA: Diagnosis not present

## 2019-03-10 DIAGNOSIS — R413 Other amnesia: Secondary | ICD-10-CM | POA: Diagnosis not present

## 2019-03-10 DIAGNOSIS — M150 Primary generalized (osteo)arthritis: Secondary | ICD-10-CM

## 2019-03-10 DIAGNOSIS — R269 Unspecified abnormalities of gait and mobility: Secondary | ICD-10-CM

## 2019-03-10 DIAGNOSIS — M792 Neuralgia and neuritis, unspecified: Secondary | ICD-10-CM | POA: Diagnosis not present

## 2019-03-10 DIAGNOSIS — W19XXXA Unspecified fall, initial encounter: Secondary | ICD-10-CM

## 2019-03-10 DIAGNOSIS — I10 Essential (primary) hypertension: Secondary | ICD-10-CM

## 2019-03-10 DIAGNOSIS — M8949 Other hypertrophic osteoarthropathy, multiple sites: Secondary | ICD-10-CM | POA: Diagnosis not present

## 2019-03-10 DIAGNOSIS — M159 Polyosteoarthritis, unspecified: Secondary | ICD-10-CM

## 2019-03-10 NOTE — Progress Notes (Signed)
Location:    [facility] Room Number: 32 Place of Service:  ALF (13) Provider:  Dajour Pierpoint NP  [name], MD  Patient Care Team: [name], MD as PCP - General (Internal Medicine) [name], NP as Nurse Practitioner (Internal Medicine)  Extended Emergency Contact Information Primary Emergency Contact: Ricardo [name], [place] Home Phone: [phone] Mobile Phone: [phone] Relation: Son Secondary Emergency Contact: Ricardo [name] Mobile Phone: [phone] Relation: Grandson  Code Status:  DNR Goals of care: Advanced Directive information Advanced Directives 12/27/2018  Does Patient Have a Medical Advance Directive? Yes  Type of Advance Directive Out of facility DNR (pink MOST or yellow form);Living will;Healthcare Power of Attorney  Does patient want to make changes to medical advance directive? No - Patient declined  Copy of Healthcare Power of Attorney in Chart? Yes - validated most recent copy scanned in chart (See row information)  Would patient like information on creating a medical advance directive? -  Pre-existing out of facility DNR order (yellow form or pink MOST form) Yellow form placed in chart (order not valid for inpatient use)     Chief Complaint  Patient presents with  . Acute Visit    Fall    HPI:  Pt is a 85 y.o. male seen today for an acute visit for fell 03/05/19, no apparent injury. The patient has no recollection of the event. The patient resides in [facility] for safety, care assistance, w/c for mobility. Hx of HTN, blood pressure is controlled, off meds. Peripheral neuropathy/generalized osteoarthritic pain, stable, on Gabapentin  qd, Oxycodone  bid, Tylenol  bid.    Past Medical History:  Diagnosis Date  . AAA (abdominal aortic aneurysm) (HCC)   . Abnormality of gait 08/01/2010  . Adhesive capsulitis of shoulder 08/01/2010   "Frozen" right shoulder  . Anemia   . Anxiety state, unspecified 08/01/2010  . Arthritis   . Congestive heart failure, unspecified 08/01/2010  . Cough 08/07/2015  . Disorders of bursae and tendons in shoulder region, unspecified 08/01/2010  . Edema 08/01/2010  . Fall   . Fecal impaction (HCC)   . GERD (gastroesophageal reflux disease)   . Hiatal hernia   . Hypercalcemia   . Hyperlipidemia   . Hypertension   . Hypertrophy of prostate without urinary obstruction and other lower urinary tract symptoms (LUTS) 08/01/2010  . Hyponatremia   . Hypothyroidism   . Infectious colitis   . Insomnia, unspecified 08/01/2010  . Joint pain   . Leg pain    with walking  . Lumbago 12/24/2010  . Orthostatic hypotension 11/05/2010  . Other specified cardiac dysrhythmias(427.89) 12/24/2010  . Pain in joint, pelvic region and thigh 08/06/2010  . Peptic ulcer, unspecified site, unspecified as acute or chronic, without mention of hemorrhage, perforation, or obstruction 08/01/2010  . Renal failure, acute (HCC)   . Swelling, mass, or lump in head and neck 01/03/2011  . Ulcer    Stomach  . Unspecified disorder of kidney and ureter 08/01/2010  . Unspecified hearing loss 08/01/2010  . Unspecified vitamin D deficiency 08/06/2010  . Urinary frequency 09/13/2010  . Vertigo    Past Surgical History:  Procedure Laterality Date  . ABDOMINAL AORTIC ANEURYSM REPAIR  12-18-10   Stent graft repair of AAA  . APPENDECTOMY    . CATARACT EXTRACTION    . HEMORRHOID SURGERY  2000  . INGUINAL  HERNIA REPAIR Left 2005  . TRANSTHORACIC ECHOCARDIOGRAM  07/19/2010    Left ventricle: There is hypokinesis of the inferior wall and  posterior wall. The EF is 35-40%. The cavity size was normal    Allergies  Allergen Reactions  . Aleve [Naproxen] Other (See Comments)    Per MAR  . Indocin [Indomethacin] Other (See Comments)    Per MAR    Allergies as of 03/10/2019      Reactions   Aleve [naproxen] Other (See Comments)   Per MAR     Indocin [indomethacin] Other (See Comments)   Per [facility]      Medication List       Accurate as of March 10, 2019 12:46 PM. If you have any questions, ask your nurse or doctor.        STOP taking these medications   hydrALAZINE 10 MG tablet Commonly known as: APRESOLINE Stopped by: Taytum Wheller X Alize Borrayo, NP     TAKE these medications   acetaminophen 500 MG tablet Commonly known as: TYLENOL Take 1,000 mg by mouth 2 (two) times daily.   acetaminophen 500 MG tablet Commonly known as: TYLENOL Take 500 mg by mouth every 8 (eight) hours as needed.   Aspercreme Lidocaine 4 % Ptch Generic drug: Lidocaine Apply 1 patch topically See admin instructions. 1 patch applied once a day to mid lower back   benzocaine 10 % mucosal gel Commonly known as: ORAJEL 1 application. May keep at bedside and self-administer. Apply with cotton applicator to mouth and/or throat for mouth pain as needed.   bisacodyl 10 MG suppository Commonly known as: DULCOLAX Place 10 mg rectally every other day. For constipation Hold for loose stools   Cholecalciferol 25 MCG (1000 UT) tablet Take 2,000 Units by mouth daily.   DEXTRAN 70-HYPROMELLOSE (PF) OP Place 1 drop into both eyes as needed (for dry eyes). wait 3-5 minutes between eye drops   finasteride 5 MG tablet Commonly known as: PROSCAR Take 5 mg by mouth daily. Women who are pregnant or may become pregnant should use precautions when handling this medication.   fluocinonide 0.05 % external solution Commonly known as: LIDEX Apply 1 application topically as needed. apply to scalp for itching   fluticasone 50 MCG/ACT nasal spray Commonly known as: FLONASE Place 2 sprays into both nostrils daily.   gabapentin 100 MG capsule Commonly known as: NEURONTIN Take 100 mg by mouth daily.   hydrocortisone 1 % lotion Apply 1 application topically 2 (two) times daily as needed for itching.   ketoconazole 2 % shampoo Commonly known as: NIZORAL Apply 1  application topically once a week. On Tues.   levothyroxine 50 MCG tablet Commonly known as: SYNTHROID Take 50 mcg by mouth daily before breakfast.   magic mouthwash w/lidocaine Soln Take 5-10 mLs by mouth every 4 (four) hours as needed for mouth pain.   magnesium citrate Soln Take by mouth as needed for severe constipation. Per ED physician resident may take 10 oz by mouth followed by 8 oz of water as needed every 4th day if no bowel movement in 3 days   Mintox 063-016-01 MG/5ML suspension Generic drug: alum & mag hydroxide-simeth Take 30 mLs by mouth every 4 (four) hours as needed for indigestion or heartburn. PRN indigestion/stomach upset x 48 hours. Notify MD if symptoms continue. DO NOT USE FOR CHRONIC KIDNEY DISEASE STAGE FOUR OR HEMODIALYSIS PATIENTS.   OcuSoft Eyelid Cleansing Pads Apply topically. 1 pad to each eye once a morning   omeprazole  20 MG tablet Commonly known as: PRILOSEC OTC Take 20 mg by mouth daily.   oxyCODONE 10 mg 12 hr tablet Commonly known as: OxyCONTIN Take 1 tablet (10 mg total) by mouth daily. At 6am. Do Not Crush   polyethylene glycol 17 g packet Commonly known as: MIRALAX / GLYCOLAX Take 17 g by mouth 2 (two) times daily. Mix in 4-8 ounces of fluids for constipation,Hold for loose stools   sennosides-docusate sodium 8.6-50 MG tablet Commonly known as: SENOKOT-S Take 2 tablets by mouth at bedtime.   Shingrix injection Generic drug: Zoster Vaccine Adjuvanted Inject 0.5 mLs into the muscle once. Start taking on: May 06, 2019   SleepRight Breathe Aid Misc 1 strip at bedtime.   sodium chloride 0.65 % Soln nasal spray Commonly known as: OCEAN Place 1 spray into both nostrils 3 (three) times daily as needed for congestion.   triamcinolone 0.1 % paste Commonly known as: KENALOG Use as directed 1 application in the mouth or throat every 4 (four) hours as needed.   TUSSIN COUGH DM PO Take 5 mLs by mouth every 4 (four) hours as needed.        ROS was provided with assistance of staff.  Review of Systems  Constitutional: Negative for activity change, appetite change, chills, diaphoresis, fatigue and fever.  HENT: Positive for hearing loss. Negative for congestion and voice change.   Eyes: Negative for visual disturbance.  Respiratory: Negative for cough, shortness of breath and wheezing.   Cardiovascular: Negative for chest pain, palpitations and leg swelling.  Gastrointestinal: Negative for abdominal distention, abdominal pain, constipation, diarrhea, nausea and vomiting.  Genitourinary: Negative for difficulty urinating, dysuria and urgency.  Musculoskeletal: Positive for arthralgias, back pain and gait problem.  Skin: Negative for color change and pallor.  Neurological: Negative for dizziness, speech difficulty, weakness and headaches.       Memory lapses.   Psychiatric/Behavioral: Negative for agitation, behavioral problems, hallucinations and sleep disturbance. The patient is not nervous/anxious.     Immunization History  Administered Date(s) Administered  . Influenza-Unspecified 12/27/2010, 12/29/2011, 12/27/2012, 12/13/2013, 12/14/2014, 12/20/2015, 12/17/2016, 12/14/2017  . PPD Test 07/30/2010, 08/14/2010  . Pneumococcal Conjugate-13 11/04/2016  . Pneumococcal Polysaccharide-23 03/10/2005  . Td 03/11/1999  . Tdap 10/31/2016   Pertinent  Health Maintenance Due  Topic Date Due  . INFLUENZA VACCINE  Completed  . PNA vac Low Risk Adult  Completed   Fall Risk  11/04/2017 10/29/2016 08/07/2015 08/07/2015 04/10/2015  Falls in the past year? No Yes Yes No No  Comment - - 06/02/15- skin tear right arm - -  Number falls in past yr: - 1 - - -  Injury with Fall? - No - - -   Functional Status Survey:    Vitals:   03/10/19 1133  BP: 126/70  Pulse: 66  Resp: 18  Temp: 97.9 F (36.6 C)  SpO2: 96%  Weight: 133 lb 6.4 oz (60.5 kg)  Height: 5\' 9"  (1.753 m)   Body mass index is 19.7 kg/m. Physical Exam Vitals and  nursing note reviewed.  Constitutional:      General: He is not in acute distress.    Appearance: Normal appearance. He is normal weight. He is not ill-appearing, toxic-appearing or diaphoretic.  HENT:     Head: Normocephalic and atraumatic.     Nose: Nose normal.     Mouth/Throat:     Mouth: Mucous membranes are moist.  Eyes:     Extraocular Movements: Extraocular movements intact.  Conjunctiva/sclera: Conjunctivae normal.     Pupils: Pupils are equal, round, and reactive to light.  Cardiovascular:     Rate and Rhythm: Normal rate and regular rhythm.     Heart sounds: Murmur present.  Pulmonary:     Breath sounds: No wheezing, rhonchi or rales.  Abdominal:     General: Bowel sounds are normal. There is no distension.     Palpations: Abdomen is soft.     Tenderness: There is no abdominal tenderness. There is no right CVA tenderness, left CVA tenderness, guarding or rebound.  Musculoskeletal:     Cervical back: Normal range of motion and neck supple.     Right lower leg: No edema.     Left lower leg: No edema.  Skin:    General: Skin is warm and dry.  Neurological:     General: No focal deficit present.     Mental Status: He is alert. Mental status is at baseline.     Cranial Nerves: No cranial nerve deficit.     Motor: No weakness.     Coordination: Coordination normal.     Gait: Gait abnormal.     Comments: Oriented to person, place.   Psychiatric:        Mood and Affect: Mood normal.        Behavior: Behavior normal.        Thought Content: Thought content normal.     Labs reviewed: Recent Labs    08/19/18 0000 09/30/18 0000 11/03/18 0000  NA 136* 138 135*  K 5.0 5.3 5.0  CL  --  109 107  CO2  --  18 21  BUN 54* 50* 46*  CREATININE 1.3 1.1 1.2  CALCIUM  --  8.6 8.5   Recent Labs    11/03/18 0000  AST 14  ALT 6*  ALKPHOS 132*  PROT 6.2*  ALBUMIN 3.7   Recent Labs    07/08/18 0000 11/03/18 0000  WBC 6.5 7.2  NEUTROABS  --  4,550  HGB 30.1*  9.6*  HCT 90* 30*  PLT 185 203   Lab Results  Component Value Date   TSH 2.19 09/30/2018   Lab Results  Component Value Date   HGBA1C 5.6 04/06/2017   Lab Results  Component Value Date   CHOL 145 04/06/2017   HDL 47 04/06/2017   LDLCALC 89 04/06/2017   TRIG 47 04/06/2017   CHOLHDL 3.1 04/06/2017    Significant Diagnostic Results in last 30 days:  No results found.  Assessment/Plan Fall at nursing home [place], no apparent injury noted, lack of safety awareness, increased frailty/gait/balance are contributory, close supervision for safety.   Neuropathic pain Stable, continue Gabapentin.   Osteoarthritis Multiple sites, in general, continue Tylenol, Oxycodone.   Essential hypertension Controlled, off meds.   Gait abnormality Continue w/c for mobility, close supervision for safety.   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 Continue [facility] for safety, care assistance. Close supervision for safety.     Family/ staff Communication: plan of care reviewed with the patient and charge nurse.   Labs/tests ordered:  None  Time spend 40 minutes.

## 2019-03-10 NOTE — Assessment & Plan Note (Signed)
Multiple sites, in general, continue Tylenol, Oxycodone.

## 2019-03-10 NOTE — Assessment & Plan Note (Signed)
Controlled, off meds.  

## 2019-03-10 NOTE — Assessment & Plan Note (Signed)
Fell, no apparent injury noted, lack of safety awareness, increased frailty/gait/balance are contributory, close supervision for safety.

## 2019-03-10 NOTE — Assessment & Plan Note (Signed)
Memory lapses. Continue [facility] for safety, care assistance. Close supervision for safety.

## 2019-03-10 NOTE — Assessment & Plan Note (Signed)
Continue w/c for mobility, close supervision for safety.

## 2019-03-10 NOTE — Assessment & Plan Note (Signed)
Stable, continue Gabapentin.  

## 2019-03-29 ENCOUNTER — Other Ambulatory Visit: Payer: Self-pay | Admitting: *Deleted

## 2019-03-29 ENCOUNTER — Non-Acute Institutional Stay: Payer: Medicare PPO | Admitting: Nurse Practitioner

## 2019-03-29 ENCOUNTER — Encounter: Payer: Self-pay | Admitting: Nurse Practitioner

## 2019-03-29 DIAGNOSIS — M792 Neuralgia and neuritis, unspecified: Secondary | ICD-10-CM

## 2019-03-29 DIAGNOSIS — M8949 Other hypertrophic osteoarthropathy, multiple sites: Secondary | ICD-10-CM | POA: Diagnosis not present

## 2019-03-29 DIAGNOSIS — K5901 Slow transit constipation: Secondary | ICD-10-CM

## 2019-03-29 DIAGNOSIS — E039 Hypothyroidism, unspecified: Secondary | ICD-10-CM | POA: Diagnosis not present

## 2019-03-29 DIAGNOSIS — N401 Enlarged prostate with lower urinary tract symptoms: Secondary | ICD-10-CM | POA: Diagnosis not present

## 2019-03-29 DIAGNOSIS — J309 Allergic rhinitis, unspecified: Secondary | ICD-10-CM

## 2019-03-29 DIAGNOSIS — K219 Gastro-esophageal reflux disease without esophagitis: Secondary | ICD-10-CM | POA: Diagnosis not present

## 2019-03-29 DIAGNOSIS — M159 Polyosteoarthritis, unspecified: Secondary | ICD-10-CM

## 2019-03-29 DIAGNOSIS — R3911 Hesitancy of micturition: Secondary | ICD-10-CM

## 2019-03-29 MED ORDER — OXYCODONE HCL ER 10 MG PO T12A: 10.0000 mg | EXTENDED_RELEASE_TABLET | Freq: Every day | ORAL | 0 refills | Status: DC

## 2019-03-29 NOTE — Assessment & Plan Note (Signed)
Multiple sites, continue Tylenol, Oxycodone.  

## 2019-03-29 NOTE — Telephone Encounter (Signed)
Received fax from [facility] and sent to Dr. Gupta for approval.  

## 2019-03-29 NOTE — Progress Notes (Signed)
Location:  [facility] Room Number: 32 Place of Service:  ALF [phone]) Provider: Colum Colt Johnney Ou, NP   [name], MD  Patient Care Team: [name], MD as PCP - General (Internal Medicine) Selena Swaminathan X, NP as Nurse Practitioner (Internal Medicine)  Extended Emergency Contact Information Primary Emergency Contact: [name], [place] Home Phone: [phone] Mobile Phone: [phone] Relation: Son Secondary Emergency Contact: [name] [place] Mobile Phone: [phone] Relation: Grandson  Code Status:  DNR Goals of care: Advanced Directive information Advanced Directives 03/29/2019  Does Patient Have a Medical Advance Directive? Yes  Type of Advance Directive Out of facility DNR (pink MOST or yellow form);Living will  Does patient want to make changes to medical advance directive? No - Patient declined  Copy of Healthcare Power of Attorney in Chart? -  Would patient like information on creating a medical advance directive? -  Pre-existing out of facility DNR order (yellow form or pink MOST form) Yellow form placed in chart (order not valid for inpatient use);Pink MOST form placed in chart (order not valid for inpatient use)     Chief Complaint  Patient presents with  . Medical Management of Chronic Issues    Routine Visit    HPI:  Pt is a 86 y.o. male seen today for medical management of chronic diseases.    The patient resides in [facility] for safety, care assistance, w/c for mobility. Hx of multiple site osteoarthritis, stable, on Oxycodone 10mg  qd,  Tylenol 1000mg  bid, 500mg  q8h prn. Constipation, stable, on MiraLax bid, Senokot S II qd,  DulcoLax suppository qod. BPH, no urinary retention, on Finasteride 5mg  qd. Allergic rhinitis, stable, on Flonase qd. Peripheral neuropathy, stable, on Gabapentin 100mg  qd. Hypothyroidism, on Levothyroxine qd. GERD, stable, on Omeprazole 20mg  qd.     Past Medical History:  Diagnosis Date  . AAA (abdominal aortic aneurysm) (HCC)   . Abnormality of gait 08/01/2010  . Adhesive capsulitis of shoulder 08/01/2010   "Frozen" right shoulder  . Anemia   . Anxiety state, unspecified 08/01/2010  . Arthritis   . Congestive heart failure, unspecified 08/01/2010  . Cough 08/07/2015  . Disorders of bursae and tendons in shoulder region, unspecified 08/01/2010  . Edema 08/01/2010  . Fall   . Fecal impaction (HCC)   . GERD (gastroesophageal reflux disease)   . Hiatal hernia   . Hypercalcemia   . Hyperlipidemia   . Hypertension   . Hypertrophy of prostate without urinary obstruction and other lower urinary tract symptoms (LUTS) 08/01/2010  . Hyponatremia   . Hypothyroidism   . Infectious colitis   . Insomnia, unspecified 08/01/2010  . Joint pain   . Leg pain    with walking  . Lumbago 12/24/2010  . Orthostatic hypotension 11/05/2010  . Other specified cardiac dysrhythmias(427.89) 12/24/2010  . Pain in joint, pelvic region and thigh 08/06/2010  . Peptic ulcer, unspecified site, unspecified as acute or chronic, without mention of hemorrhage, perforation, or obstruction 08/01/2010  . Renal failure, acute (HCC)   . Swelling, mass, or lump in head and neck 01/03/2011  . Ulcer    Stomach  . Unspecified disorder of kidney and ureter 08/01/2010  . Unspecified hearing loss 08/01/2010  . Unspecified vitamin D deficiency 08/06/2010  . Urinary frequency 09/13/2010  . Vertigo    Past Surgical History:  Procedure Laterality Date  . ABDOMINAL AORTIC ANEURYSM REPAIR  12-18-10   Stent graft repair of AAA  . APPENDECTOMY    . CATARACT EXTRACTION    . HEMORRHOID SURGERY  2000  . INGUINAL HERNIA REPAIR Left 2005  . TRANSTHORACIC ECHOCARDIOGRAM  07/19/2010    Left ventricle: There is hypokinesis of the inferior wall and  posterior wall. The EF is 35-40%. The cavity size was normal    Allergies  Allergen Reactions  . Aleve [Naproxen] Other (See Comments)     Per MAR  . Indocin [Indomethacin] Other (See Comments)    Per [facility]    Outpatient Encounter Medications as of 03/29/2019  Medication Sig  . acetaminophen (TYLENOL) 500 MG tablet Take 1,000 mg by mouth 2 (two) times daily.   Marland Kitchen acetaminophen (TYLENOL) 500 MG tablet Take 500 mg by mouth every 8 (eight) hours as needed.   Marland Kitchen alum & mag hydroxide-simeth (MINTOX) 200-200-20 MG/5ML suspension Take 30 mLs by mouth every 4 (four) hours as needed for indigestion or heartburn. PRN indigestion/stomach upset x 48 hours. Notify MD if symptoms continue. DO NOT USE FOR CHRONIC KIDNEY DISEASE STAGE FOUR OR HEMODIALYSIS PATIENTS.  . benzocaine (ORAJEL) 10 % mucosal gel 1 application. May keep at bedside and self-administer. Apply with cotton applicator to mouth and/or throat for mouth pain as needed.  . bisacodyl (DULCOLAX) 10 MG suppository Place 10 mg rectally every other day. For constipation Hold for loose stools  . Cholecalciferol 25 MCG (1000 UT) tablet Take 2,000 Units by mouth daily.   Marland Kitchen DEXTRAN 70-HYPROMELLOSE, PF, OP Place 1 drop into both eyes as needed (for dry eyes). wait 3-5 minutes between eye drops  . Dextromethorphan-Guaifenesin (TUSSIN COUGH DM PO) Take 5 mLs by mouth every 4 (four) hours as needed.  . Eyelid Cleansers (OCUSOFT EYELID CLEANSING) PADS Apply topically. 1 pad to each eye once a morning  . finasteride (PROSCAR) 5 MG tablet Take 5 mg by mouth daily. Women who are pregnant or may become pregnant should use precautions when handling this medication.  . fluocinonide (LIDEX) 0.05 % external solution Apply 1 application topically as needed. apply to scalp for itching  . fluticasone (FLONASE) 50 MCG/ACT nasal spray Place 2 sprays into both nostrils at bedtime.   . gabapentin (NEURONTIN) 100 MG capsule Take 100 mg by mouth daily.  . hydrocortisone 1 % lotion Apply 1 application topically 2 (two) times daily as needed for itching.   Marland Kitchen ketoconazole (NIZORAL) 2 % shampoo Apply 1 application  topically once a week. On Tues.  Marland Kitchen levothyroxine (SYNTHROID) 50 MCG tablet Take 50 mcg by mouth daily before breakfast.  . Lidocaine (ASPERCREME LIDOCAINE) 4 % PTCH Apply 1 patch topically See admin instructions. 1 patch applied once a day to mid lower back  . magic mouthwash w/lidocaine SOLN Take 5-10 mLs by mouth every 4 (four) hours as needed for mouth pain.  . magnesium citrate SOLN Take by mouth as needed for severe constipation. Per ED physician resident may take 10 oz by mouth followed by 8 oz of water as needed every 4th day if no bowel movement in 3 days  . Misc. Devices (SLEEPRIGHT BREATHE AID) MISC 1 strip at bedtime.  Marland Kitchen omeprazole (PRILOSEC OTC) 20 MG tablet Take 20 mg by mouth daily.  . polyethylene glycol (MIRALAX / GLYCOLAX) packet Take 17 g by mouth 2 (two) times daily. Mix in 4-8 ounces of fluids for constipation,Hold for loose stools  . sennosides-docusate sodium (SENOKOT-S) 8.6-50 MG tablet Take 2 tablets by mouth at bedtime.  . sodium  chloride (OCEAN) 0.65 % SOLN nasal spray Place 1 spray into both nostrils 3 (three) times daily as needed for congestion.  . triamcinolone (KENALOG) 0.1 % paste Use as directed 1 application in the mouth or throat every 4 (four) hours as needed.  Derrill Memo ON 05/06/2019] Zoster Vaccine Adjuvanted [facility]) injection Inject 0.5 mLs into the muscle once.  . [DISCONTINUED] oxyCODONE (OXYCONTIN) 10 mg 12 hr tablet Take 1 tablet (10 mg total) by mouth daily. At 6am. Do Not Crush   No facility-administered encounter medications on file as of 03/29/2019.   ROS was provided with assistance of staff.  Review of Systems  Constitutional: Negative for activity change, appetite change, chills, diaphoresis, fatigue, fever and unexpected weight change.  HENT: Positive for hearing loss. Negative for congestion and voice change.   Eyes: Negative for visual disturbance.  Respiratory: Negative for cough, shortness of breath and wheezing.   Cardiovascular: Positive  for leg swelling. Negative for chest pain and palpitations.  Gastrointestinal: Negative for abdominal distention, abdominal pain, constipation, diarrhea, nausea and vomiting.  Genitourinary: Negative for difficulty urinating, dysuria and urgency.  Musculoskeletal: Positive for arthralgias, back pain and gait problem.  Skin: Negative for color change and pallor.  Neurological: Negative for dizziness, speech difficulty, weakness and headaches.       Memory lapses.   Psychiatric/Behavioral: Negative for agitation, behavioral problems, hallucinations and sleep disturbance. The patient is not nervous/anxious.     Immunization History  Administered Date(s) Administered  . Influenza-Unspecified 12/27/2010, 12/29/2011, 12/27/2012, 12/13/2013, 12/14/2014, 12/20/2015, 12/17/2016, 12/14/2017  . PPD Test 07/30/2010, 08/14/2010  . Pneumococcal Conjugate-13 11/04/2016  . Pneumococcal Polysaccharide-23 03/10/2005  . Td 03/11/1999  . Tdap 10/31/2016  . Zoster Recombinat (Shingrix) 11/12/2018   Pertinent  Health Maintenance Due  Topic Date Due  . INFLUENZA VACCINE  Completed  . PNA vac Low Risk Adult  Completed   Fall Risk  11/04/2017 10/29/2016 08/07/2015 08/07/2015 04/10/2015  Falls in the past year? No Yes Yes No No  Comment - - 06/02/15- skin tear right arm - -  Number falls in past yr: - 1 - - -  Injury with Fall? - No - - -   Functional Status Survey:    Vitals:   03/29/19 0826  BP: 124/84  Pulse: 79  Resp: 20  Temp: (!) 97.5 F (36.4 C)  TempSrc: Oral  SpO2: 93%  Weight: 133 lb 6.4 oz (60.5 kg)  Height: 5\' 9"  (1.753 m)   Body mass index is 19.7 kg/m. Physical Exam Vitals and nursing note reviewed.  Constitutional:      General: He is not in acute distress.    Appearance: Normal appearance. He is not ill-appearing, toxic-appearing or diaphoretic.  HENT:     Head: Normocephalic and atraumatic.     Nose: Nose normal.     Mouth/Throat:     Mouth: Mucous membranes are moist.    Eyes:     Extraocular Movements: Extraocular movements intact.     Conjunctiva/sclera: Conjunctivae normal.     Pupils: Pupils are equal, round, and reactive to light.  Cardiovascular:     Rate and Rhythm: Normal rate and regular rhythm.     Heart sounds: Murmur present.  Pulmonary:     Breath sounds: Rales present.     Comments: Bibasilar rales.  Abdominal:     General: Bowel sounds are normal. There is no distension.     Palpations: Abdomen is soft.     Tenderness: There is no abdominal tenderness. There  is no right CVA tenderness, left CVA tenderness, guarding or rebound.  Musculoskeletal:     Cervical back: Normal range of motion and neck supple.     Right lower leg: Edema present.     Left lower leg: Edema present.     Comments: Trace edema in ankles.   Skin:    General: Skin is warm and dry.  Neurological:     General: No focal deficit present.     Mental Status: He is alert. Mental status is at baseline.     Motor: No weakness.     Coordination: Coordination normal.     Gait: Gait abnormal.     Comments: Oriented to person, place.   Psychiatric:        Mood and Affect: Mood normal.        Behavior: Behavior normal.        Thought Content: Thought content normal.        Judgment: Judgment normal.     Labs reviewed: Recent Labs    08/19/18 0000 09/30/18 0000 11/03/18 0000  NA 136* 138 135*  K 5.0 5.3 5.0  CL  --  109 107  CO2  --  18 21  BUN 54* 50* 46*  CREATININE 1.3 1.1 1.2  CALCIUM  --  8.6 8.5   Recent Labs    11/03/18 0000  AST 14  ALT 6*  ALKPHOS 132*  PROT 6.2*  ALBUMIN 3.7   Recent Labs    07/08/18 0000 11/03/18 0000  WBC 6.5 7.2  NEUTROABS  --  4,550  HGB 30.1* 9.6*  HCT 90* 30*  PLT 185 203   Lab Results  Component Value Date   TSH 2.19 09/30/2018   Lab Results  Component Value Date   HGBA1C 5.6 04/06/2017   Lab Results  Component Value Date   CHOL 145 04/06/2017   HDL 47 04/06/2017   LDLCALC 89 04/06/2017   TRIG 47  04/06/2017   CHOLHDL 3.1 04/06/2017    Significant Diagnostic Results in last 30 days:    Assessment/Plan GERD Stable, continue Omeprazole.   Hypothyroidism Stable, TSH wnl 09/2018, continue Levothyroxine qd.   Osteoarthritis Multiple sites, continue Tylenol, Oxycodone.   BPH (benign prostatic hyperplasia) No urinary retention, continue Finasteride.   Constipation Stable, continue MiraLax, Senokot S, DulcoLax  Neuropathic pain Stable, continue Tylenol, Oxycodone, Gabapentin.   Allergic rhinitis Stable, continue Flonase.     Family/ staff Communication: plan of care reviewed with the patient and charge nurse.   Labs/tests ordered:  None  Time spend 40 minutes.

## 2019-03-29 NOTE — Assessment & Plan Note (Signed)
No urinary retention, continue Finasteride.  

## 2019-03-29 NOTE — Assessment & Plan Note (Signed)
Stable, continue Flonase.  

## 2019-03-29 NOTE — Assessment & Plan Note (Signed)
Stable, continue Tylenol, Oxycodone, Gabapentin.

## 2019-03-29 NOTE — Assessment & Plan Note (Signed)
Stable, TSH wnl 09/2018, continue Levothyroxine qd.

## 2019-03-29 NOTE — Assessment & Plan Note (Signed)
Stable, continue MiraLax, Senokot S, DulcoLax

## 2019-03-29 NOTE — Assessment & Plan Note (Signed)
Stable, continue Omeprazole.  

## 2019-04-14 ENCOUNTER — Non-Acute Institutional Stay: Payer: Medicare PPO | Admitting: Nurse Practitioner

## 2019-04-14 ENCOUNTER — Encounter: Payer: Self-pay | Admitting: Nurse Practitioner

## 2019-04-14 DIAGNOSIS — R269 Unspecified abnormalities of gait and mobility: Secondary | ICD-10-CM | POA: Diagnosis not present

## 2019-04-14 DIAGNOSIS — M792 Neuralgia and neuritis, unspecified: Secondary | ICD-10-CM | POA: Diagnosis not present

## 2019-04-14 DIAGNOSIS — M8949 Other hypertrophic osteoarthropathy, multiple sites: Secondary | ICD-10-CM

## 2019-04-14 DIAGNOSIS — M159 Polyosteoarthritis, unspecified: Secondary | ICD-10-CM

## 2019-04-14 DIAGNOSIS — Z7689 Persons encountering health services in other specified circumstances: Secondary | ICD-10-CM | POA: Diagnosis not present

## 2019-04-14 NOTE — Progress Notes (Addendum)
Location:   [place] Room Number: 32 Place of Service:  ALF (13) Provider:  Hermina Barnard NP  Virgie Dad, MD  Patient Care Team: Virgie Dad, MD as PCP - General (Internal Medicine) Dairon Procter X, NP as Nurse Practitioner (Internal Medicine)  Extended Emergency Contact Information Primary Emergency Contact: Annia Belt, [place] Phone: [phone] Mobile Phone: [phone] Relation: Son Secondary Emergency Contact: Larence Penning [place] Mobile Phone: [phone] Relation: Grandson  Code Status:  DNR Goals of care: Advanced Directive information Advanced Directives 04/27/2019  Does Patient Have a Medical Advance Directive? Yes  Type of Paramedic of [place];Living will;Out of facility DNR (pink MOST or yellow form)  Does patient want to make changes to medical advance directive? No - Patient declined  Copy of [place] in Chart? Yes - validated most recent copy scanned in chart (See row information)  Would patient like information on creating a medical advance directive? -  Pre-existing out of facility DNR order (yellow form or pink MOST form) Pink MOST form placed in chart (order not valid for inpatient use);Yellow form placed in chart (order not valid for inpatient use)     Chief Complaint  Patient presents with  . Acute Visit    Mobility assessment for power wheelchair     HPI:  Pt is a 86 y.o. male seen today for an acute visit for mobility assessment for procurement of power wheelchair replacement.   The patient resides in [place] for safety and care assistance, relies on power wheelchair for mobility and for out of bed positioning to eat his meals, participate in his Assisted Living community,  and visit with family. His present power wheelchair is out of order which needs replacement. The patient usually travels 300 feet from his  room to [place] room activities. The patient has history peripheral neuropathy, lower body weakness, back pain,  no longer ambulatory, he is able to transfer self from/to bed/commode-power chair. Chronic multiple sites osteoarthritis, R+L shoulder pain/limited ROM of approximate 30 degree flexion, does not demonstrate active extension of bilateral shoulder to reach for the manual wheelchair rims to propel self while in the wheelchair. The patient has limited physical exertion tolerance duet to his chronic medical conditions of Bradycardia, Vertigo, abdomina aortic aneurysm without rupture, non surgical candidate, last measurement 9.2cm, advanced age of 50 which also prevent him from using a manual wheelchair to self propel in his room, into hallway, and to the Assisted Living dinning room. He has been using power chair safely in the past to give him ability to function at assisted living level. A new power chair replacement is recommended for the patient to continue his current physical function level, attend meals in the dining room, to access the shower room, and participate in family visit approximated 750 feet from his room, and to maintain his  quality of life at assistance living [facility].   The patient takes Tylenol 1000mg  bid, 500mg  q8h prn, Gabapentin 100mg  qd, Lidocaine patch, Oxycodone 10mg  qd for osteoarthritic pain and peripheral neuropathy.   Past Medical History:  Diagnosis Date  . AAA (abdominal aortic aneurysm) ([place])   . Abnormality of gait 08/01/2010  . Adhesive capsulitis of shoulder 08/01/2010   "Frozen" right shoulder  . Anemia   . Anxiety state, unspecified 08/01/2010  . Arthritis   . Congestive heart failure, unspecified  08/01/2010  . Cough 08/07/2015  . Disorders of bursae and tendons in shoulder region, unspecified 08/01/2010  . Edema 08/01/2010  . Fall   . Fecal impaction (HCC)   . GERD (gastroesophageal reflux disease)   . Hiatal hernia   . Hypercalcemia   .  Hyperlipidemia   . Hypertension   . Hypertrophy of prostate without urinary obstruction and other lower urinary tract symptoms (LUTS) 08/01/2010  . Hyponatremia   . Hypothyroidism   . Infectious colitis   . Insomnia, unspecified 08/01/2010  . Joint pain   . Leg pain    with walking  . Lumbago 12/24/2010  . Orthostatic hypotension 11/05/2010  . Other specified cardiac dysrhythmias(427.89) 12/24/2010  . Pain in joint, pelvic region and thigh 08/06/2010  . Peptic ulcer, unspecified site, unspecified as acute or chronic, without mention of hemorrhage, perforation, or obstruction 08/01/2010  . Renal failure, acute (HCC)   . Swelling, mass, or lump in head and neck 01/03/2011  . Ulcer    Stomach  . Unspecified disorder of kidney and ureter 08/01/2010  . Unspecified hearing loss 08/01/2010  . Unspecified vitamin D deficiency 08/06/2010  . Urinary frequency 09/13/2010  . Vertigo    Past Surgical History:  Procedure Laterality Date  . ABDOMINAL AORTIC ANEURYSM REPAIR  12-18-10   Stent graft repair of AAA  . APPENDECTOMY    . CATARACT EXTRACTION    . HEMORRHOID SURGERY  2000  . INGUINAL HERNIA REPAIR Left 2005  . TRANSTHORACIC ECHOCARDIOGRAM  07/19/2010    Left ventricle: There is hypokinesis of the inferior wall and  posterior wall. The EF is 35-40%. The cavity size was normal    Allergies  Allergen Reactions  . Aleve [Naproxen] Other (See Comments)    Per MAR  . Indocin [Indomethacin] Other (See Comments)    Per MAR    Allergies as of 04/14/2019      Reactions   Aleve [naproxen] Other (See Comments)   Per MAR   Indocin [indomethacin] Other (See Comments)   Per [facility]      Medication List       Accurate as of April 14, 2019 11:59 PM. If you have any questions, ask your nurse or doctor.        acetaminophen 500 MG tablet Commonly known as: TYLENOL Take 1,000 mg by mouth 2 (two) times daily.   acetaminophen 500 MG tablet Commonly known as: TYLENOL Take 500 mg by mouth every 8  (eight) hours as needed.   Aspercreme Lidocaine 4 % Ptch Generic drug: Lidocaine Apply 1 patch topically See admin instructions. 1 patch applied once a day to mid lower back   benzocaine 10 % mucosal gel Commonly known as: ORAJEL 1 application. May keep at bedside and self-administer. Apply with cotton applicator to mouth and/or throat for mouth pain as needed.   bisacodyl 10 MG suppository Commonly known as: DULCOLAX Place 10 mg rectally every other day. For constipation Hold for loose stools   Cholecalciferol 25 MCG (1000 UT) tablet Take 2,000 Units by mouth daily.   DEXTRAN 70-HYPROMELLOSE (PF) OP Place 1 drop into both eyes as needed (for dry eyes). wait 3-5 minutes between eye drops   finasteride 5 MG tablet Commonly known as: PROSCAR Take 5 mg by mouth daily. Women who are pregnant or may become pregnant should use precautions when handling this medication.   fluocinonide 0.05 % external solution Commonly known as: LIDEX Apply 1 application topically as needed. apply to scalp for itching  fluticasone 50 MCG/ACT nasal spray Commonly known as: FLONASE Place 2 sprays into both nostrils at bedtime.   gabapentin 100 MG capsule Commonly known as: NEURONTIN Take 100 mg by mouth daily.   hydrocortisone 1 % lotion Apply 1 application topically 2 (two) times daily as needed for itching.   ketoconazole 2 % shampoo Commonly known as: NIZORAL Apply 1 application topically once a week. On Tues.   levothyroxine 50 MCG tablet Commonly known as: SYNTHROID Take 50 mcg by mouth daily before breakfast.   magic mouthwash w/lidocaine Soln Take 5-10 mLs by mouth every 4 (four) hours as needed for mouth pain.   magnesium citrate Soln Take by mouth as needed for severe constipation. Per ED physician resident may take 10 oz by mouth followed by 8 oz of water as needed every 4th day if no bowel movement in 3 days   Mintox 200-200-20 MG/5ML suspension Generic drug: alum & mag  hydroxide-simeth Take 30 mLs by mouth every 4 (four) hours as needed for indigestion or heartburn. PRN indigestion/stomach upset x 48 hours. Notify MD if symptoms continue. DO NOT USE FOR CHRONIC KIDNEY DISEASE STAGE FOUR OR HEMODIALYSIS PATIENTS.   OcuSoft Eyelid Cleansing Pads Apply topically. 1 pad to each eye once a morning   omeprazole 20 MG tablet Commonly known as: PRILOSEC OTC Take 20 mg by mouth daily.   oxyCODONE 10 mg 12 hr tablet Commonly known as: OxyCONTIN Take 1 tablet (10 mg total) by mouth daily. At 6am. Do Not Crush   polyethylene glycol 17 g packet Commonly known as: MIRALAX / GLYCOLAX Take 17 g by mouth 2 (two) times daily. Mix in 4-8 ounces of fluids for constipation,Hold for loose stools   sennosides-docusate sodium 8.6-50 MG tablet Commonly known as: SENOKOT-S Take 2 tablets by mouth at bedtime.   Shingrix injection Generic drug: Zoster Vaccine Adjuvanted Inject 0.5 mLs into the muscle once. Start taking on: May 06, 2019   SleepRight Breathe Aid Misc 1 strip at bedtime.   sodium chloride 0.65 % Soln nasal spray Commonly known as: OCEAN Place 1 spray into both nostrils 3 (three) times daily as needed for congestion.   triamcinolone 0.1 % paste Commonly known as: KENALOG Use as directed 1 application in the mouth or throat every 4 (four) hours as needed.   TUSSIN COUGH DM PO Take 5 mLs by mouth every 4 (four) hours as needed.       Review of Systems  Constitutional: Negative for activity change, appetite change, chills, diaphoresis, fatigue and fever.  HENT: Positive for hearing loss. Negative for congestion and voice change.   Eyes: Negative for visual disturbance.  Respiratory: Positive for shortness of breath. Negative for cough and wheezing.        DOE  Cardiovascular: Negative for chest pain, palpitations and leg swelling.  Gastrointestinal: Negative for abdominal distention, abdominal pain, constipation, diarrhea, nausea and vomiting.   Genitourinary: Negative for difficulty urinating and dysuria.  Musculoskeletal: Positive for arthralgias, back pain and gait problem.  Skin: Negative for color change and pallor.  Neurological: Positive for weakness. Negative for speech difficulty and headaches.  Psychiatric/Behavioral: Negative for agitation, behavioral problems, hallucinations and sleep disturbance. The patient is not nervous/anxious.     Immunization History  Administered Date(s) Administered  . Influenza, High Dose Seasonal PF 12/21/2018  . Influenza-Unspecified 12/27/2010, 12/29/2011, 12/27/2012, 12/13/2013, 12/14/2014, 12/20/2015, 12/17/2016, 12/14/2017  . Moderna SARS-COVID-2 Vaccination 03/14/2019, 04/11/2019  . PPD Test 07/30/2010, 08/14/2010  . Pneumococcal Conjugate-13 11/04/2016  . Pneumococcal  Polysaccharide-23 03/10/2005  . Td 03/11/1999  . Tdap 10/31/2016  . Zoster Recombinat (Shingrix) 11/12/2018   Pertinent  Health Maintenance Due  Topic Date Due  . INFLUENZA VACCINE  Completed  . PNA vac Low Risk Adult  Completed   Fall Risk  11/04/2017 10/29/2016 08/07/2015 08/07/2015 04/10/2015  Falls in the past year? No Yes Yes No No  Comment - - 06/02/15- skin tear right arm - -  Number falls in past yr: - 1 - - -  Injury with Fall? - No - - -   Functional Status Survey:    Vitals:   04/14/19 1043  BP: (!) 142/78  Pulse: 72  Resp: 18  Temp: (!) 97 F (36.1 C)  SpO2: 92%  Weight: 133 lb 6.4 oz (60.5 kg)  Height: 5\' 9"  (1.753 m)   Body mass index is 19.7 kg/m. Physical Exam Vitals and nursing note reviewed.  Constitutional:      General: He is not in acute distress.    Appearance: Normal appearance. He is not ill-appearing, toxic-appearing or diaphoretic.  HENT:     Head: Normocephalic.     Nose: Nose normal.     Mouth/Throat:     Mouth: Mucous membranes are moist.  Eyes:     Extraocular Movements: Extraocular movements intact.     Conjunctiva/sclera: Conjunctivae normal.     Pupils: Pupils  are equal, round, and reactive to light.  Cardiovascular:     Rate and Rhythm: Normal rate and regular rhythm.     Heart sounds: Murmur present.  Pulmonary:     Breath sounds: Rales present.     Comments: Bibasilar rales.  Abdominal:     General: Bowel sounds are normal. There is no distension.     Palpations: Abdomen is soft.     Tenderness: There is no abdominal tenderness. There is no right CVA tenderness, left CVA tenderness, guarding or rebound.  Musculoskeletal:     Cervical back: Normal range of motion.     Right lower leg: No edema.     Left lower leg: No edema.     Comments: R+L should pain with resistance, decreased ROM with both flexion and extension. No long ambulatory. Transfers self from/to bed/power wheelchair/toilet.  Skin:    General: Skin is warm and dry.  Neurological:     General: No focal deficit present.     Mental Status: He is alert. Mental status is at baseline.     Motor: Weakness present.     Coordination: Coordination normal.     Gait: Gait abnormal.     Comments: Oriented to person, place. Lower body weakness.   Psychiatric:        Mood and Affect: Mood normal.        Behavior: Behavior normal.        Thought Content: Thought content normal.        Judgment: Judgment normal.     Labs reviewed: Recent Labs    08/19/18 0000 09/30/18 0000 11/03/18 0000  NA 136* 138 135*  K 5.0 5.3 5.0  CL  --  109 107  CO2  --  18 21  BUN 54* 50* 46*  CREATININE 1.3 1.1 1.2  CALCIUM  --  8.6 8.5   Recent Labs    11/03/18 0000  AST 14  ALT 6*  ALKPHOS 132*  PROT 6.2*  ALBUMIN 3.7   Recent Labs    07/08/18 0000 11/03/18 0000  WBC 6.5 7.2  NEUTROABS  --  4,550  HGB 30.1* 9.6*  HCT 90* 30*  PLT 185 203   Lab Results  Component Value Date   TSH 2.19 09/30/2018   Lab Results  Component Value Date   HGBA1C 5.6 04/06/2017   Lab Results  Component Value Date   CHOL 145 04/06/2017   HDL 47 04/06/2017   LDLCALC 89 04/06/2017   TRIG 47  04/06/2017   CHOLHDL 3.1 04/06/2017    Significant Diagnostic Results in last 30 days:  No results found.  Assessment/Plan Encounter for power mobility device assessment The patient resides in [place] [facility] for safety and care assistance, relies on power wheelchair for mobility and for out of bed positioning to eat his meals, participate in his Assisted Living community,  and visit with family. His present power wheelchair is out of order which needs replacement. The patient usually travels 300 feet from his room to attendliving room activities. The patient has history peripheral neuropathy, lower body weakness, back pain,  no longer ambulatory, he is able to transfer self from/to bed/commode-power chair. Chronic multiple sites osteoarthritis, R+L shoulder pain/limited ROM of approximate 30 degree flexion, does not demonstrate active extension of bilateral shoulder to reach for the manual wheelchair rims to propel self while in the wheelchair. The patient has limited physical exertion tolerance duet to his chronic medical conditions of Bradycardia, Vertigo, abdomina aortic aneurysm without rupture, non surgical candidate, last measurement 9.2cm, advanced age of 86 which also prevent him from using a manual wheelchair to self propel in his room, into hallway, and to the Assisted Living dinning room. He has been using power chair safely in the past to give him ability to function at assisted living level. A new power chair replacement is recommended for the patient to continue his current physical function level, attend meals in the dining room, to access the shower room, and participate in family visit approximated 750 feet from his room, and to maintain his  quality of life at assistance living [facility].  I have read the PT evaluation and concurs with the PT evaluation.    Osteoarthritis Multiple sites, continue Tylenol 1000mg  bid, 500mg  q8h prn, Gabapentin 100mg  qd, Lidocaine patch, Oxycodone 10mg   qd   Neuropathic pain Lower body weakness, lower back pain, spinal stenosis in cervical region, continue  Tylenol 1000mg  bid, 500mg  q8h prn, Gabapentin 100mg  qd, Lidocaine patch, Oxycodone 10mg  qd   Gait abnormality Depends on power wheelchair for mobility.      Family/ staff Communication: plan of care reviewed with the patient and charge nurse.   Labs/tests ordered:  none  Time spend 40 minutes.

## 2019-04-15 ENCOUNTER — Encounter: Payer: Self-pay | Admitting: Nurse Practitioner

## 2019-04-15 DIAGNOSIS — Z7689 Persons encountering health services in other specified circumstances: Secondary | ICD-10-CM | POA: Insufficient documentation

## 2019-04-15 NOTE — Assessment & Plan Note (Addendum)
The patient resides in [place] [facility] for safety and care assistance, relies on power wheelchair for mobility and for out of bed positioning to eat his meals, participate in his Assisted Living community,  and visit with family. His present power wheelchair is out of order which needs replacement. The patient usually travels 300 feet from his room to attendliving room activities. The patient has history peripheral neuropathy, lower body weakness, back pain,  no longer ambulatory, he is able to transfer self from/to bed/commode-power chair. Chronic multiple sites osteoarthritis, R+L shoulder pain/limited ROM of approximate 30 degree flexion, does not demonstrate active extension of bilateral shoulder to reach for the manual wheelchair rims to propel self while in the wheelchair. The patient has limited physical exertion tolerance duet to his chronic medical conditions of Bradycardia, Vertigo, abdomina aortic aneurysm without rupture, non surgical candidate, last measurement 9.2cm, advanced age of 82 which also prevent him from using a manual wheelchair to self propel in his room, into hallway, and to the Assisted Living dinning room. He has been using power chair safely in the past to give him ability to function at assisted living level. A new power chair replacement is recommended for the patient to continue his current physical function level, attend meals in the dining room, to access the shower room, and participate in family visit approximated 750 feet from his room, and to maintain his  quality of life at assistance living [place].  I have read the PT evaluation and concurs with the PT evaluation.

## 2019-04-15 NOTE — Assessment & Plan Note (Signed)
Depends on power wheelchair for mobility.

## 2019-04-15 NOTE — Assessment & Plan Note (Signed)
Multiple sites, continue Tylenol 1000mg  bid, 500mg  q8h prn, Gabapentin 100mg  qd, Lidocaine patch, Oxycodone 10mg  qd

## 2019-04-15 NOTE — Assessment & Plan Note (Signed)
Lower body weakness, lower back pain, spinal stenosis in cervical region, continue  Tylenol 1000mg  bid, 500mg  q8h prn, Gabapentin 100mg  qd, Lidocaine patch, Oxycodone 10mg  qd

## 2019-04-27 ENCOUNTER — Encounter: Payer: Self-pay | Admitting: Internal Medicine

## 2019-04-27 ENCOUNTER — Non-Acute Institutional Stay: Payer: Medicare PPO | Admitting: Internal Medicine

## 2019-04-27 DIAGNOSIS — R531 Weakness: Secondary | ICD-10-CM

## 2019-04-27 DIAGNOSIS — I714 Abdominal aortic aneurysm, without rupture, unspecified: Secondary | ICD-10-CM

## 2019-04-27 DIAGNOSIS — I10 Essential (primary) hypertension: Secondary | ICD-10-CM

## 2019-04-27 DIAGNOSIS — D638 Anemia in other chronic diseases classified elsewhere: Secondary | ICD-10-CM | POA: Diagnosis not present

## 2019-04-27 NOTE — Progress Notes (Signed)
Location:   [facility] Room Number: 32 Place of Service:  ALF [phone]) Provider:  [name], MD  [name], MD  Patient Care Team: [name], MD as PCP - General (Internal Medicine) Mast, Man X, NP as Nurse Practitioner (Internal Medicine)  Extended Emergency Contact Information Primary Emergency Contact: [name], [place] Home Phone: [phone] Mobile Phone: [phone] Relation: Son Secondary Emergency Contact: [name] [place] Mobile Phone: [phone] Relation: Grandson  Code Status:  DNR Goals of care: Advanced Directive information Advanced Directives 04/27/2019  Does Patient Have a Medical Advance Directive? Yes  Type of Estate agent of [place];Living will;Out of facility DNR (pink MOST or yellow form)  Does patient want to make changes to medical advance directive? No - Patient declined  Copy of Healthcare Power of Attorney in Chart? Yes - validated most recent copy scanned in chart (See row information)  Would patient like information on creating a medical advance directive? -  Pre-existing out of facility DNR order (yellow form or pink MOST form) Pink MOST form placed in chart (order not valid for inpatient use);Yellow form placed in chart (order not valid for inpatient use)     Chief Complaint  Patient presents with  . Acute Visit    Change in condition    HPI:  Pt is a 86 y.o. male seen today for an acute visit for Change in condition with patient asking for more help with his ADLS. Feeling weak  Patient is a resident in [facility] He has a history of AAA with measurement of 9.3 cm in CT scanin07/19,hypertension, peripheral neuropathy, BPH, hypothyroid, GERD and very hard of hearing  It is hard to get history from patient as he is [facility] But he is mostly independent in his ADLS. Wheelchair bound but able to do his  transfers Nurses have noticed he is needing more then usual help Especially with is dressing. No Other issues. No Cough, Fever or dysuria. He was alert and responding. Mental status seemed at baseline  Past Medical History:  Diagnosis Date  . AAA (abdominal aortic aneurysm) (HCC)   . Abnormality of gait 08/01/2010  . Adhesive capsulitis of shoulder 08/01/2010   "Frozen" right shoulder  . Anemia   . Anxiety state, unspecified 08/01/2010  . Arthritis   . Congestive heart failure, unspecified 08/01/2010  . Cough 08/07/2015  . Disorders of bursae and tendons in shoulder region, unspecified 08/01/2010  . Edema 08/01/2010  . Fall   . Fecal impaction (HCC)   . GERD (gastroesophageal reflux disease)   . Hiatal hernia   . Hypercalcemia   . Hyperlipidemia   . Hypertension   . Hypertrophy of prostate without urinary obstruction and other lower urinary tract symptoms (LUTS) 08/01/2010  . Hyponatremia   . Hypothyroidism   . Infectious colitis   . Insomnia, unspecified 08/01/2010  . Joint pain   . Leg pain    with walking  . Lumbago 12/24/2010  . Orthostatic hypotension 11/05/2010  . Other specified cardiac dysrhythmias(427.89) 12/24/2010  . Pain in joint, pelvic region and thigh 08/06/2010  . Peptic ulcer, unspecified site, unspecified as acute or chronic, without mention of hemorrhage, perforation, or obstruction 08/01/2010  . Renal failure, acute (HCC)   . Swelling, mass, or lump in head and neck 01/03/2011  . Ulcer    Stomach  . Unspecified disorder of kidney  and ureter 08/01/2010  . Unspecified hearing loss 08/01/2010  . Unspecified vitamin D deficiency 08/06/2010  . Urinary frequency 09/13/2010  . Vertigo    Past Surgical History:  Procedure Laterality Date  . ABDOMINAL AORTIC ANEURYSM REPAIR  12-18-10   Stent graft repair of AAA  . APPENDECTOMY    . CATARACT EXTRACTION    . HEMORRHOID SURGERY  2000  . INGUINAL HERNIA REPAIR Left 2005  . TRANSTHORACIC ECHOCARDIOGRAM  07/19/2010     Left ventricle: There is hypokinesis of the inferior wall and  posterior wall. The EF is 35-40%. The cavity size was normal    Allergies  Allergen Reactions  . Aleve [Naproxen] Other (See Comments)    Per MAR  . Indocin [Indomethacin] Other (See Comments)    Per MAR    Allergies as of 04/27/2019      Reactions   Aleve [naproxen] Other (See Comments)   Per MAR   Indocin [indomethacin] Other (See Comments)   Per [facility]      Medication List       Accurate as of April 27, 2019 10:31 AM. If you have any questions, ask your nurse or doctor.        acetaminophen 500 MG tablet Commonly known as: TYLENOL Take 1,000 mg by mouth 2 (two) times daily.   acetaminophen 500 MG tablet Commonly known as: TYLENOL Take 500 mg by mouth every 8 (eight) hours as needed.   Aspercreme Lidocaine 4 % Ptch Generic drug: Lidocaine Apply 1 patch topically See admin instructions. 1 patch applied once a day to mid lower back   benzocaine 10 % mucosal gel Commonly known as: ORAJEL 1 application. May keep at bedside and self-administer. Apply with cotton applicator to mouth and/or throat for mouth pain as needed.   bisacodyl 10 MG suppository Commonly known as: DULCOLAX Place 10 mg rectally every other day. For constipation Hold for loose stools   Cholecalciferol 25 MCG (1000 UT) tablet Take 2,000 Units by mouth daily.   DEXTRAN 70-HYPROMELLOSE (PF) OP Place 1 drop into both eyes as needed (for dry eyes). wait 3-5 minutes between eye drops   finasteride 5 MG tablet Commonly known as: PROSCAR Take 5 mg by mouth daily. Women who are pregnant or may become pregnant should use precautions when handling this medication.   fluocinonide 0.05 % external solution Commonly known as: LIDEX Apply 1 application topically as needed. apply to scalp for itching   fluticasone 50 MCG/ACT nasal spray Commonly known as: FLONASE Place 2 sprays into both nostrils at bedtime.   gabapentin 100 MG  capsule Commonly known as: NEURONTIN Take 100 mg by mouth daily.   hydrocortisone 1 % lotion Apply 1 application topically 2 (two) times daily as needed for itching.   ketoconazole 2 % shampoo Commonly known as: NIZORAL Apply 1 application topically once a week. On Tues.   levothyroxine 50 MCG tablet Commonly known as: SYNTHROID Take 50 mcg by mouth daily before breakfast.   magic mouthwash w/lidocaine Soln Take 5-10 mLs by mouth every 4 (four) hours as needed for mouth pain.   magnesium citrate Soln Take by mouth as needed for severe constipation. Per ED physician resident may take 10 oz by mouth followed by 8 oz of water as needed every 4th day if no bowel movement in 3 days   Mintox 200-200-20 MG/5ML suspension Generic drug: alum & mag hydroxide-simeth Take 30 mLs by mouth every 4 (four) hours as needed for indigestion or heartburn. PRN indigestion/stomach  upset x 48 hours. Notify MD if symptoms continue. DO NOT USE FOR CHRONIC KIDNEY DISEASE STAGE FOUR OR HEMODIALYSIS PATIENTS.   OcuSoft Eyelid Cleansing Pads Apply topically. 1 pad to each eye once a morning   omeprazole 20 MG tablet Commonly known as: PRILOSEC OTC Take 20 mg by mouth daily.   oxyCODONE 10 mg 12 hr tablet Commonly known as: OxyCONTIN Take 1 tablet (10 mg total) by mouth daily. At 6am. Do Not Crush   polyethylene glycol 17 g packet Commonly known as: MIRALAX / GLYCOLAX Take 17 g by mouth 2 (two) times daily. Mix in 4-8 ounces of fluids for constipation,Hold for loose stools   sennosides-docusate sodium 8.6-50 MG tablet Commonly known as: SENOKOT-S Take 2 tablets by mouth at bedtime.   Shingrix injection Generic drug: Zoster Vaccine Adjuvanted Inject 0.5 mLs into the muscle once. Start taking on: May 06, 2019   SleepRight Breathe Aid Misc 1 strip at bedtime.   sodium chloride 0.65 % Soln nasal spray Commonly known as: OCEAN Place 1 spray into both nostrils 3 (three) times daily as needed  for congestion.   triamcinolone 0.1 % paste Commonly known as: KENALOG Use as directed 1 application in the mouth or throat every 4 (four) hours as needed.   TUSSIN COUGH DM PO Take 5 mLs by mouth every 4 (four) hours as needed.       Review of Systems  Reason unable to perform ROS: Very Hard of hearing.  Constitutional: Negative.   HENT: Negative.   Respiratory: Negative.   Cardiovascular: Negative.   Gastrointestinal: Negative.   Genitourinary: Negative.   Musculoskeletal: Negative.   Neurological: Positive for weakness.  Psychiatric/Behavioral: Positive for confusion.    Immunization History  Administered Date(s) Administered  . Influenza, High Dose Seasonal PF 12/21/2018  . Influenza-Unspecified 12/27/2010, 12/29/2011, 12/27/2012, 12/13/2013, 12/14/2014, 12/20/2015, 12/17/2016, 12/14/2017  . Moderna SARS-COVID-2 Vaccination 03/14/2019, 04/11/2019  . PPD Test 07/30/2010, 08/14/2010  . Pneumococcal Conjugate-13 11/04/2016  . Pneumococcal Polysaccharide-23 03/10/2005  . Td 03/11/1999  . Tdap 10/31/2016  . Zoster Recombinat (Shingrix) 11/12/2018   Pertinent  Health Maintenance Due  Topic Date Due  . INFLUENZA VACCINE  Completed  . PNA vac Low Risk Adult  Completed   Fall Risk  11/04/2017 10/29/2016 08/07/2015 08/07/2015 04/10/2015  Falls in the past year? No Yes Yes No No  Comment - - 06/02/15- skin tear right arm - -  Number falls in past yr: - 1 - - -  Injury with Fall? - No - - -   Functional Status Survey:    Vitals:   04/27/19 1025  BP: 132/70  Pulse: 64  Resp: 16  Temp: (!) 97.3 F (36.3 C)  SpO2: 93%  Weight: 133 lb 6.4 oz (60.5 kg)  Height: 5\' 9"  (1.753 m)   Body mass index is 19.7 kg/m. Physical Exam  Constitutional: . Well-developed and well-nourished.  HENT:  Head: Normocephalic.  Mouth/Throat: Oropharynx is clear and moist.  Eyes: Pupils are equal, round, and reactive to light.  Neck: Neck supple.  Cardiovascular: Normal rate and normal  heart sounds.  No murmur heard. Pulmonary/Chest: Effort normal and breath sounds normal. No respiratory distress. No wheezes. He has no rales.  Abdominal: Soft. Bowel sounds are normal. No distension. There is no tenderness. There is no rebound.  Musculoskeletal: No edema.  Lymphadenopathy: none Neurological: No Focal Deficits Mental status seemed at baseline  Skin: Skin is warm and dry.  Psychiatric: Normal mood and affect. Behavior is normal.  Thought content normal.    Labs reviewed: Recent Labs    08/19/18 0000 09/30/18 0000 11/03/18 0000  NA 136* 138 135*  K 5.0 5.3 5.0  CL  --  109 107  CO2  --  18 21  BUN 54* 50* 46*  CREATININE 1.3 1.1 1.2  CALCIUM  --  8.6 8.5   Recent Labs    11/03/18 0000  AST 14  ALT 6*  ALKPHOS 132*  PROT 6.2*  ALBUMIN 3.7   Recent Labs    07/08/18 0000 11/03/18 0000  WBC 6.5 7.2  NEUTROABS  --  4,550  HGB 30.1* 9.6*  HCT 90* 30*  PLT 185 203   Lab Results  Component Value Date   TSH 2.19 09/30/2018   Lab Results  Component Value Date   HGBA1C 5.6 04/06/2017   Lab Results  Component Value Date   CHOL 145 04/06/2017   HDL 47 04/06/2017   LDLCALC 89 04/06/2017   TRIG 47 04/06/2017   CHOLHDL 3.1 04/06/2017    Significant Diagnostic Results in last 30 days:  No results found.  Assessment/Plan ? Weakness Will repeat Labs CBC and CMP  Nurse called and said they were unable to get Labs from him. Will await Lab to come to do labs. Will Continue to monitor his Vitals. Patient is 86 years old and not candidate for anything Aggressive Will also have therapy evaluate him.  Abdominal aortic aneurysm (AAA) without rupture (HCC) Last Measurment was 9.2 cm Nonsurgical candidate Having no issues Essential hypertension Not on Any Meds anymore Hypothyroidism Repeat TSH was normal in 7/20  Chronic kidney disease, stage III (moderate)with Hyperkalemia BUN and Creat has been Stable Repeat BMP pending Benign prostatic  hyperplasia with urinary hesitancy Stable on Proscar Neuropathic pain Stable on Neurontin  Bradycardia by electrocardiogram EKG showed Sinus Bradycardia with AV block 1 GERD On Prilosec  Anemia Hgb Low but stable Repeat CBC Family/ staff Communication:   Labs/tests ordered:  CMP and CBC  Total time spent in this patient care encounter was  25_  minutes; greater than 50% of the visit spent counseling patient and staff, reviewing records , Labs and coordinating care for problems addressed at this encounter.

## 2019-04-28 ENCOUNTER — Other Ambulatory Visit: Payer: Self-pay | Admitting: *Deleted

## 2019-04-28 DIAGNOSIS — M792 Neuralgia and neuritis, unspecified: Secondary | ICD-10-CM

## 2019-04-28 DIAGNOSIS — D649 Anemia, unspecified: Secondary | ICD-10-CM | POA: Diagnosis not present

## 2019-04-28 DIAGNOSIS — E039 Hypothyroidism, unspecified: Secondary | ICD-10-CM | POA: Diagnosis not present

## 2019-04-28 LAB — HEPATIC FUNCTION PANEL
ALT: 6 — AB (ref 10–40)
AST: 13 — AB (ref 14–40)
Alkaline Phosphatase: 136 — AB (ref 25–125)
Bilirubin, Total: 0.5

## 2019-04-28 LAB — CBC AND DIFFERENTIAL
HCT: 30 — AB (ref 41–53)
Hemoglobin: 9.7 — AB (ref 13.5–17.5)
Neutrophils Absolute: 4104
Platelets: 209 (ref 150–399)
WBC: 6.2

## 2019-04-28 LAB — BASIC METABOLIC PANEL
BUN: 31 — AB (ref 4–21)
CO2: 22 (ref 13–22)
Chloride: 108 (ref 99–108)
Creatinine: 1 (ref 0.6–1.3)
Glucose: 145
Potassium: 4.2 (ref 3.4–5.3)
Sodium: 138 (ref 137–147)

## 2019-04-28 LAB — TSH: TSH: 1.89 (ref 0.41–5.90)

## 2019-04-28 LAB — COMPREHENSIVE METABOLIC PANEL
Albumin: 3.4 — AB (ref 3.5–5.0)
Calcium: 8.5 — AB (ref 8.7–10.7)
Globulin: 2.3

## 2019-04-28 LAB — CBC: RBC: 3.29 — AB (ref 3.87–5.11)

## 2019-04-28 MED ORDER — OXYCODONE HCL ER 10 MG PO T12A: 10.0000 mg | EXTENDED_RELEASE_TABLET | Freq: Every day | ORAL | 0 refills | Status: DC

## 2019-04-28 NOTE — Telephone Encounter (Signed)
Received fax from [facility] for refill Pended Rx and sent to Dr. Gupta for approval.  

## 2019-05-30 ENCOUNTER — Other Ambulatory Visit: Payer: Self-pay | Admitting: *Deleted

## 2019-05-30 DIAGNOSIS — M792 Neuralgia and neuritis, unspecified: Secondary | ICD-10-CM

## 2019-05-30 MED ORDER — OXYCODONE HCL ER 10 MG PO T12A: 10.0000 mg | EXTENDED_RELEASE_TABLET | Freq: Every day | ORAL | 0 refills | Status: DC

## 2019-05-30 NOTE — Telephone Encounter (Signed)
Received fax from [facility] and sent to Dr. Gupta for approval.  

## 2019-06-28 ENCOUNTER — Other Ambulatory Visit: Payer: Self-pay | Admitting: *Deleted

## 2019-06-28 DIAGNOSIS — M792 Neuralgia and neuritis, unspecified: Secondary | ICD-10-CM

## 2019-06-28 MED ORDER — OXYCODONE HCL ER 10 MG PO T12A: 10.0000 mg | EXTENDED_RELEASE_TABLET | Freq: Every day | ORAL | 0 refills | Status: DC

## 2019-06-28 NOTE — Telephone Encounter (Signed)
Received fax from [facility] and sent to Dr. Gupta for approval.  

## 2019-07-13 ENCOUNTER — Non-Acute Institutional Stay: Payer: Medicare PPO | Admitting: Internal Medicine

## 2019-07-13 ENCOUNTER — Encounter: Payer: Self-pay | Admitting: Internal Medicine

## 2019-07-13 DIAGNOSIS — E039 Hypothyroidism, unspecified: Secondary | ICD-10-CM | POA: Diagnosis not present

## 2019-07-13 DIAGNOSIS — I10 Essential (primary) hypertension: Secondary | ICD-10-CM | POA: Diagnosis not present

## 2019-07-13 DIAGNOSIS — N1831 Chronic kidney disease, stage 3a: Secondary | ICD-10-CM

## 2019-07-13 DIAGNOSIS — J309 Allergic rhinitis, unspecified: Secondary | ICD-10-CM

## 2019-07-13 DIAGNOSIS — I714 Abdominal aortic aneurysm, without rupture, unspecified: Secondary | ICD-10-CM

## 2019-07-13 DIAGNOSIS — M792 Neuralgia and neuritis, unspecified: Secondary | ICD-10-CM

## 2019-07-13 NOTE — Progress Notes (Signed)
Location:    [facility] Room Number: 32 Place of Service:  ALF [phone]) Provider:  Veleta Miners MD   Virgie Dad, MD  Patient Care Team: Virgie Dad, MD as PCP - General (Internal Medicine) Mast, Man X, NP as Nurse Practitioner (Internal Medicine)  Extended Emergency Contact Information Primary Emergency Contact: Annia Belt, [place] Phone: [phone] Mobile Phone: [phone] Relation: Son Secondary Emergency Contact: Larence Penning [place] Mobile Phone: [phone] Relation: Grandson  Code Status:  DNR Goals of care: Advanced Directive information Advanced Directives 04/27/2019  Does Patient Have a Medical Advance Directive? Yes  Type of Paramedic of [place];Living will;Out of facility DNR (pink MOST or yellow form)  Does patient want to make changes to medical advance directive? No - Patient declined  Copy of [place] in Chart? Yes - validated most recent copy scanned in chart (See row information)  Would patient like information on creating a medical advance directive? -  Pre-existing out of facility DNR order (yellow form or pink MOST form) Pink MOST form placed in chart (order not valid for inpatient use);Yellow form placed in chart (order not valid for inpatient use)     Chief Complaint  Patient presents with  . Medical Management of Chronic Issues    HPI:  Pt is a 86 y.o. male seen today for medical management of chronic diseases.    Long term Resident of AL.  Patient is a resident in [facility] He has a history of AAA with measurement of 9.3 cm in CT scanin07/19,hypertension, peripheral neuropathy, BPH, hypothyroid, GERD and very hard of hearing  He is stable. No New Complains. Need some assist with his AL. Otherwise doing well. Weight stable  Past Medical History:  Diagnosis Date  . AAA (abdominal aortic  aneurysm) ([place])   . Abnormality of gait 08/01/2010  . Adhesive capsulitis of shoulder 08/01/2010   "Frozen" right shoulder  . Anemia   . Anxiety state, unspecified 08/01/2010  . Arthritis   . Congestive heart failure, unspecified 08/01/2010  . Cough 08/07/2015  . Disorders of bursae and tendons in shoulder region, unspecified 08/01/2010  . Edema 08/01/2010  . Fall   . Fecal impaction ([place])   . GERD (gastroesophageal reflux disease)   . Hiatal hernia   . Hypercalcemia   . Hyperlipidemia   . Hypertension   . Hypertrophy of prostate without urinary obstruction and other lower urinary tract symptoms (LUTS) 08/01/2010  . Hyponatremia   . Hypothyroidism   . Infectious colitis   . Insomnia, unspecified 08/01/2010  . Joint pain   . Leg pain    with walking  . Lumbago 12/24/2010  . Orthostatic hypotension 11/05/2010  . Other specified cardiac dysrhythmias(427.89) 12/24/2010  . Pain in joint, pelvic region and thigh 08/06/2010  . Peptic ulcer, unspecified site, unspecified as acute or chronic, without mention of hemorrhage, perforation, or obstruction 08/01/2010  . Renal failure, acute ([place])   . Swelling, mass, or lump in head and neck 01/03/2011  . Ulcer    Stomach  . Unspecified disorder of kidney and ureter 08/01/2010  . Unspecified hearing loss 08/01/2010  . Unspecified vitamin D deficiency 08/06/2010  . Urinary frequency 09/13/2010  . Vertigo    Past Surgical History:  Procedure Laterality Date  . ABDOMINAL AORTIC ANEURYSM REPAIR  12-18-10   Stent graft repair of AAA  .  APPENDECTOMY    . CATARACT EXTRACTION    . HEMORRHOID SURGERY  2000  . INGUINAL HERNIA REPAIR Left 2005  . TRANSTHORACIC ECHOCARDIOGRAM  07/19/2010    Left ventricle: There is hypokinesis of the inferior wall and  posterior wall. The EF is 35-40%. The cavity size was normal    Allergies  Allergen Reactions  . Aleve [Naproxen] Other (See Comments)    Per MAR  . Indocin [Indomethacin] Other (See Comments)    Per MAR      Allergies as of 07/13/2019      Reactions   Aleve [naproxen] Other (See Comments)   Per MAR   Indocin [indomethacin] Other (See Comments)   Per [facility]      Medication List       Accurate as of Jul 13, 2019 11:03 AM. If you have any questions, ask your nurse or doctor.        acetaminophen 500 MG tablet Commonly known as: TYLENOL Take 1,000 mg by mouth 2 (two) times daily.   acetaminophen 500 MG tablet Commonly known as: TYLENOL Take 500 mg by mouth every 8 (eight) hours as needed.   Aspercreme Lidocaine 4 % Ptch Generic drug: Lidocaine Apply 1 patch topically See admin instructions. 1 patch applied once a day to mid lower back   benzocaine 10 % mucosal gel Commonly known as: ORAJEL 1 application. May keep at bedside and self-administer. Apply with cotton applicator to mouth and/or throat for mouth pain as needed.   bisacodyl 10 MG suppository Commonly known as: DULCOLAX Place 10 mg rectally every other day. For constipation Hold for loose stools   Cholecalciferol 25 MCG (1000 UT) tablet Take 2,000 Units by mouth daily.   DEXTRAN 70-HYPROMELLOSE (PF) OP Place 1 drop into both eyes as needed (for dry eyes). wait 3-5 minutes between eye drops   finasteride 5 MG tablet Commonly known as: PROSCAR Take 5 mg by mouth daily. Women who are pregnant or may become pregnant should use precautions when handling this medication.   fluocinonide 0.05 % external solution Commonly known as: LIDEX Apply 1 application topically as needed. apply to scalp for itching   fluticasone 50 MCG/ACT nasal spray Commonly known as: FLONASE Place 2 sprays into both nostrils at bedtime.   gabapentin 100 MG capsule Commonly known as: NEURONTIN Take 100 mg by mouth daily.   hydrocortisone 1 % lotion Apply 1 application topically 2 (two) times daily as needed for itching.   ketoconazole 2 % shampoo Commonly known as: NIZORAL Apply 1 application topically once a week. On Tues.    levothyroxine 50 MCG tablet Commonly known as: SYNTHROID Take 50 mcg by mouth daily before breakfast.   magic mouthwash w/lidocaine Soln Take 5-10 mLs by mouth every 4 (four) hours as needed for mouth pain.   magnesium citrate Soln Take by mouth as needed for severe constipation. Per ED physician resident may take 10 oz by mouth followed by 8 oz of water as needed every 4th day if no bowel movement in 3 days   Mintox 200-200-20 MG/5ML suspension Generic drug: alum & mag hydroxide-simeth Take 30 mLs by mouth every 4 (four) hours as needed for indigestion or heartburn. PRN indigestion/stomach upset x 48 hours. Notify MD if symptoms continue. DO NOT USE FOR CHRONIC KIDNEY DISEASE STAGE FOUR OR HEMODIALYSIS PATIENTS.   OcuSoft Eyelid Cleansing Pads Apply topically. 1 pad to each eye once a morning   omeprazole 20 MG tablet Commonly known as: PRILOSEC OTC Take 20  mg by mouth daily.   oxyCODONE 10 mg 12 hr tablet Commonly known as: OxyCONTIN Take 1 tablet (10 mg total) by mouth daily. At 6am. Do Not Crush   polyethylene glycol 17 g packet Commonly known as: MIRALAX / GLYCOLAX Take 17 g by mouth 2 (two) times daily. Mix in 4-8 ounces of fluids for constipation,Hold for loose stools   sennosides-docusate sodium 8.6-50 MG tablet Commonly known as: SENOKOT-S Take 2 tablets by mouth at bedtime.   SleepRight Breathe Aid Misc 1 strip at bedtime.   sodium chloride 0.65 % Soln nasal spray Commonly known as: OCEAN Place 1 spray into both nostrils 3 (three) times daily as needed for congestion.   triamcinolone 0.1 % paste Commonly known as: KENALOG Use as directed 1 application in the mouth or throat every 4 (four) hours as needed.   TUSSIN COUGH DM PO Take 5 mLs by mouth every 4 (four) hours as needed.       Review of Systems Review of Systems  Constitutional: Negative for activity change, appetite change, chills, diaphoresis, fatigue and fever.  HENT: Negative for mouth sores,  postnasal drip, rhinorrhea, sinus pain and sore throat.   Respiratory: Negative for apnea, cough, chest tightness, shortness of breath and wheezing.   Cardiovascular: Negative for chest pain, palpitations and leg swelling.  Gastrointestinal: Negative for abdominal distention, abdominal pain, constipation, diarrhea, nausea and vomiting.  Genitourinary: Negative for dysuria and frequency.  Musculoskeletal: Negative for arthralgias, joint swelling and myalgias.  Skin: Negative for rash.  Neurological: Negative for dizziness, syncope, weakness, light-headedness and numbness.  Psychiatric/Behavioral: Negative for behavioral problems, confusion and sleep disturbance.    Immunization History  Administered Date(s) Administered  . Influenza, High Dose Seasonal PF 12/21/2018  . Influenza-Unspecified 12/27/2010, 12/29/2011, 12/27/2012, 12/13/2013, 12/14/2014, 12/20/2015, 12/17/2016, 12/14/2017  . Moderna SARS-COVID-2 Vaccination 03/14/2019, 04/11/2019  . PPD Test 07/30/2010, 08/14/2010  . Pneumococcal Conjugate-13 11/04/2016  . Pneumococcal Polysaccharide-23 03/10/2005  . Td 03/11/1999  . Tdap 10/31/2016  . Zoster Recombinat (Shingrix) 11/12/2018   Pertinent  Health Maintenance Due  Topic Date Due  . INFLUENZA VACCINE  10/09/2019  . PNA vac Low Risk Adult  Completed   Fall Risk  11/04/2017 10/29/2016 08/07/2015 08/07/2015 04/10/2015  Falls in the past year? No Yes Yes No No  Comment - - 06/02/15- skin tear right arm - -  Number falls in past yr: - 1 - - -  Injury with Fall? - No - - -   Functional Status Survey:    Vitals:   07/13/19 1059  BP: 116/84  Pulse: 75  Resp: 18  Temp: (!) 97.5 F (36.4 C)  SpO2: 94%  Weight: 130 lb 9.6 oz (59.2 kg)  Height: 5\' 9"  (1.753 m)   Body mass index is 19.29 kg/m. Physical Exam Constitutional:  Well-developed and well-nourished. Very Hard of hearing HENT:  Head: Normocephalic.  Mouth/Throat: Oropharynx is clear and moist.  Eyes: Pupils are  equal, round, and reactive to light.  Neck: Neck supple.  Cardiovascular: Normal rate and normal heart sounds.  No murmur heard. Pulmonary/Chest: Effort normal and breath sounds normal. No respiratory distress. No wheezes. She has no rales.  Abdominal: Soft. Bowel sounds are normal. No distension. There is no tenderness. There is no rebound.  Musculoskeletal: No edema.  Lymphadenopathy: none Neurological:  No Focal Deficits Mental status seemed at baseline  Skin: Skin is warm and dry.  Psychiatric: Normal mood and affect. Behavior is normal. Thought content normal.   Labs reviewed: Recent  Labs    09/30/18 0000 11/03/18 0000 04/28/19 0000  NA 138 135* 138  K 5.3 5.0 4.2  CL 109 107 108  CO2 18 21 22   BUN 50* 46* 31*  CREATININE 1.1 1.2 1.0  CALCIUM 8.6 8.5 8.5*   Recent Labs    11/03/18 0000 04/28/19 0000  AST 14 13*  ALT 6* 6*  ALKPHOS 132* 136*  PROT 6.2*  --   ALBUMIN 3.7 3.4*   Recent Labs    11/03/18 0000 04/28/19 0000  WBC 7.2 6.2  NEUTROABS 4,550 4,104  HGB 9.6* 9.7*  HCT 30* 30*  PLT 203 209   Lab Results  Component Value Date   TSH 1.89 04/28/2019   Lab Results  Component Value Date   HGBA1C 5.6 04/06/2017   Lab Results  Component Value Date   CHOL 145 04/06/2017   HDL 47 04/06/2017   LDLCALC 89 04/06/2017   TRIG 47 04/06/2017   CHOLHDL 3.1 04/06/2017    Significant Diagnostic Results in last 30 days:  No results found.  Assessment/Plan Abdominal aortic aneurysm (AAA) without rupture (HCC) Last Measurment was 9.2 cm No Issues. Non Surgical Candidate Essential hypertension Off all his Meds  Stage 3a chronic kidney disease Creat Stable  Neuropathic pain Stable on Neurontin Hypothyroidism, unspecified type TSH Normal in 2/21  Allergic rhinitis, unspecified seasonality, unspecified trigger On Flonase  BPH  Doing well on Proscar Anemia Hgb low but stable Family/ staff Communication:   Labs/tests ordered:

## 2019-07-20 IMAGING — CT CT ABD-PELV W/O CM
2 of 4 series · 15 of 46 positions shown, 17 images · non-contrast
Comparison: 05/06/2017 and 04/23/2017

CLINICAL DATA: Left lower quadrant pain radiating to groin 2 days.

EXAM:
CT ABDOMEN AND PELVIS WITHOUT CONTRAST
TECHNIQUE: Multidetector CT imaging of the abdomen and pelvis was performed
following the standard protocol without IV contrast.

[Series 2: axial st · axial · 0.79mm/px · z∈[+1161,+1621]mm · 12 of 106 slices shown, 14 images]
[im 7/106  soft-tissue]
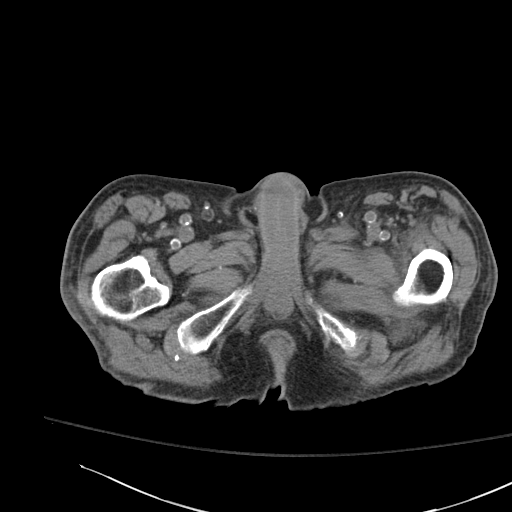
[im 7/106  bone]
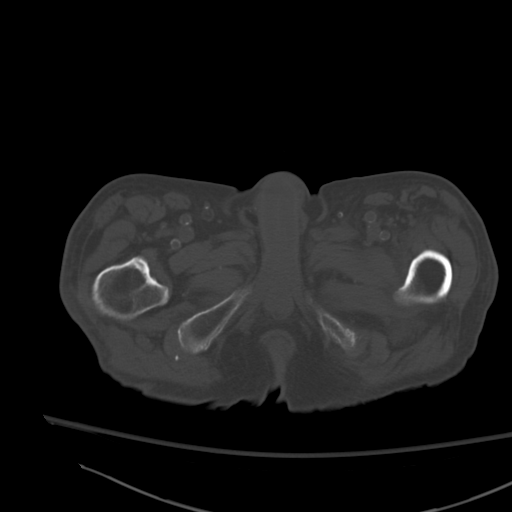
[im 19/106  soft-tissue]
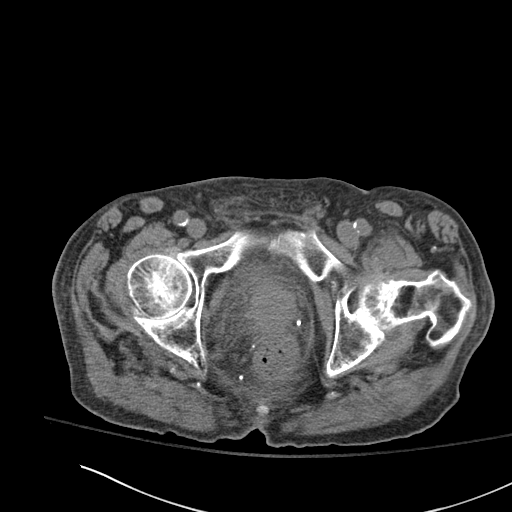
[im 25/106  soft-tissue]
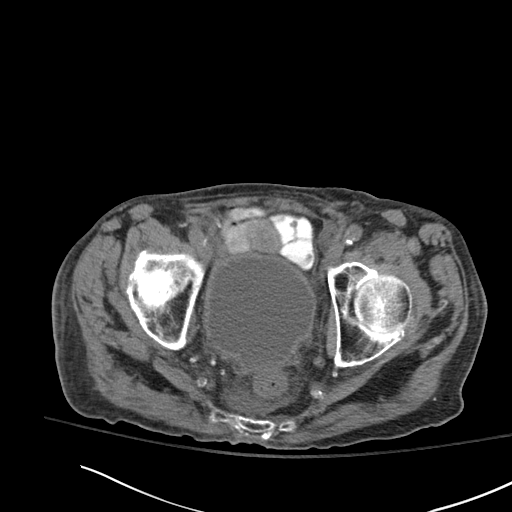
[im 31/106  soft-tissue]
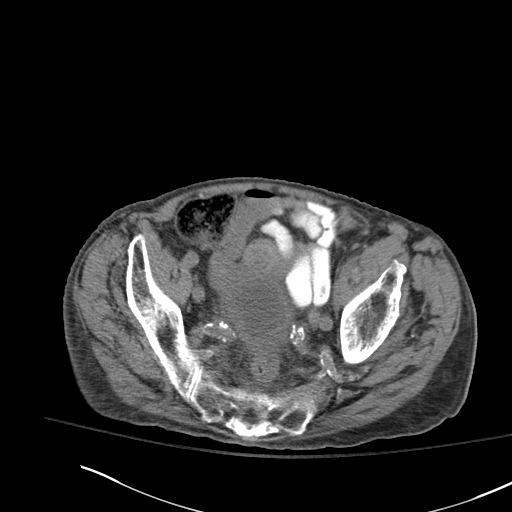
[im 44/106  soft-tissue]
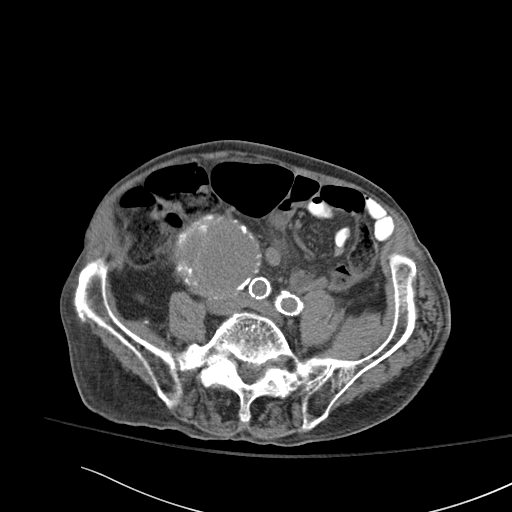
[im 50/106  soft-tissue]
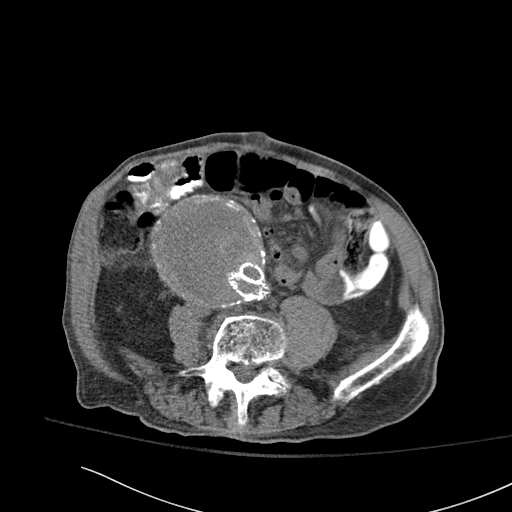
[im 56/106  soft-tissue]
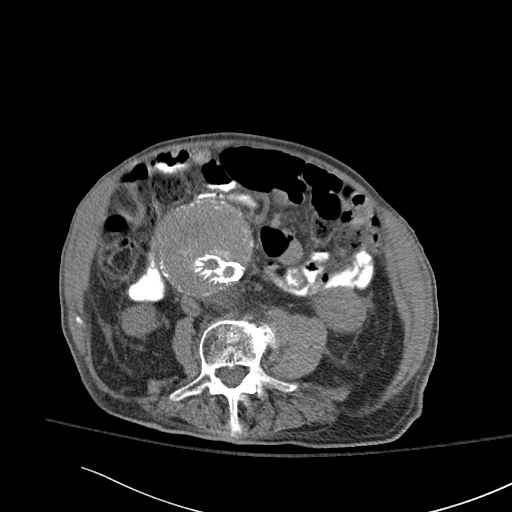
[im 68/106  soft-tissue]
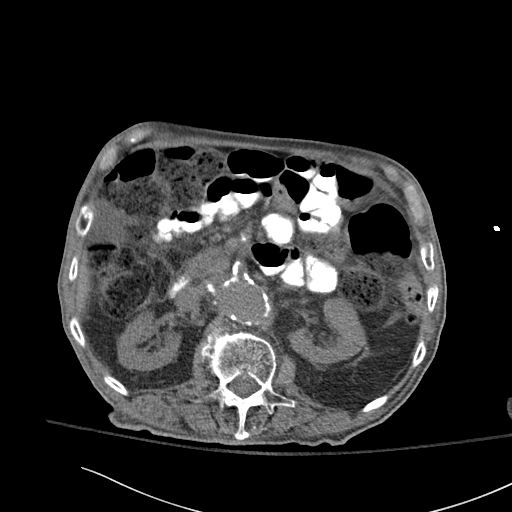
[im 75/106  soft-tissue]
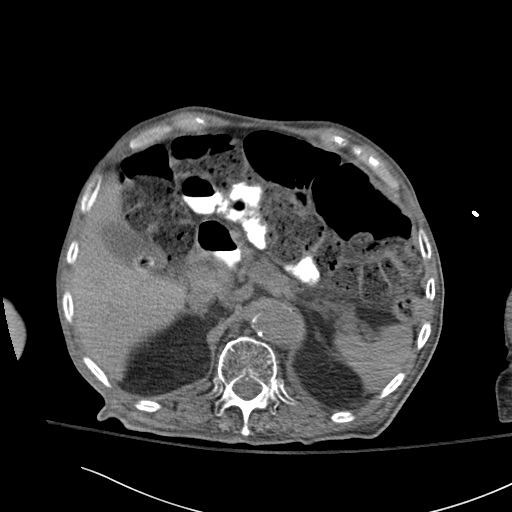
[im 75/106  bone]
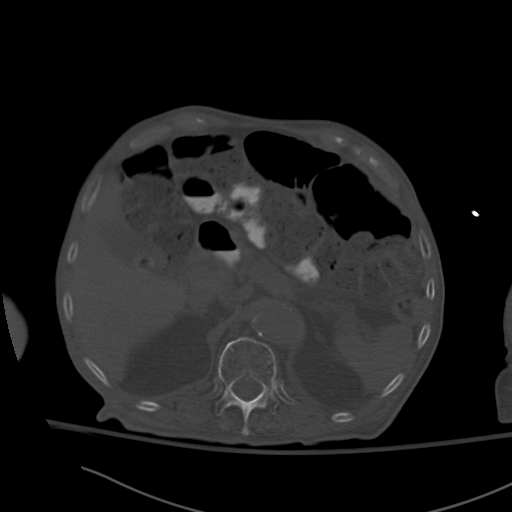
[im 81/106  soft-tissue]
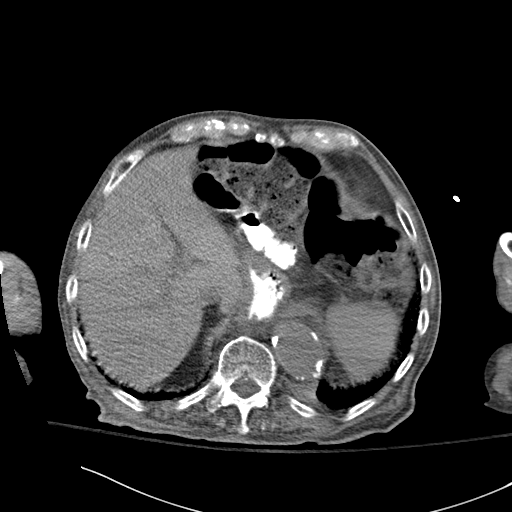
[im 93/106  soft-tissue]
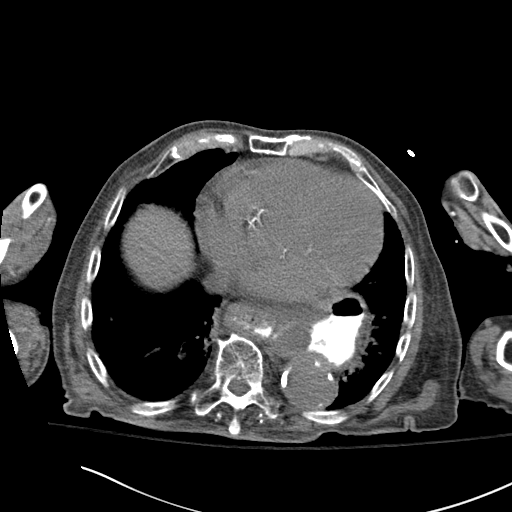
[im 99/106  soft-tissue]
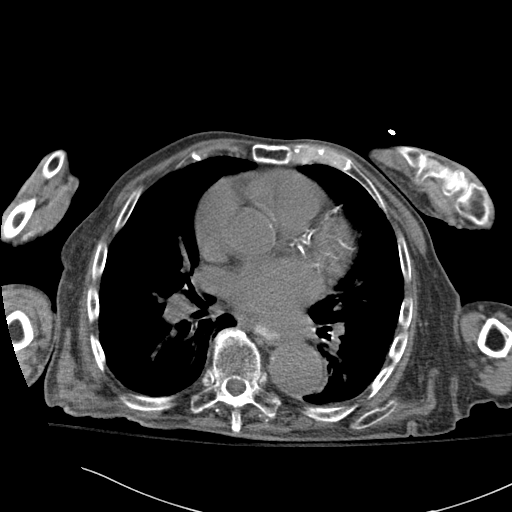

[Series 4: coronal st · coronal · 0.80mm/px · 3 of 98 slices shown]
[im 33/98  soft-tissue]
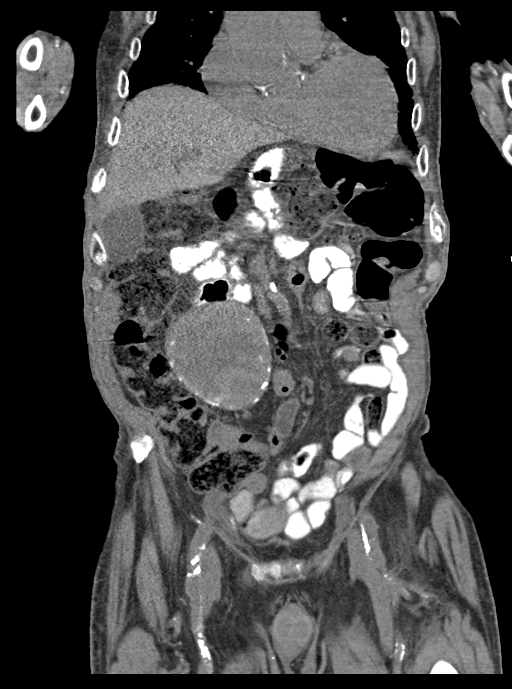
[im 44/98  soft-tissue]
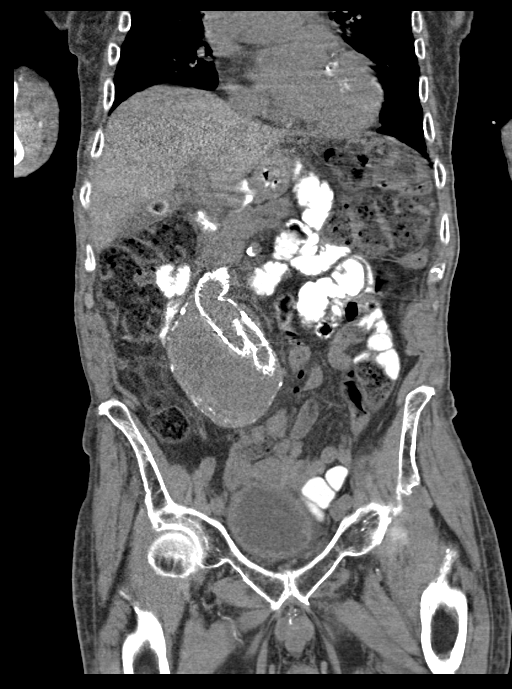
[im 54/98  soft-tissue]
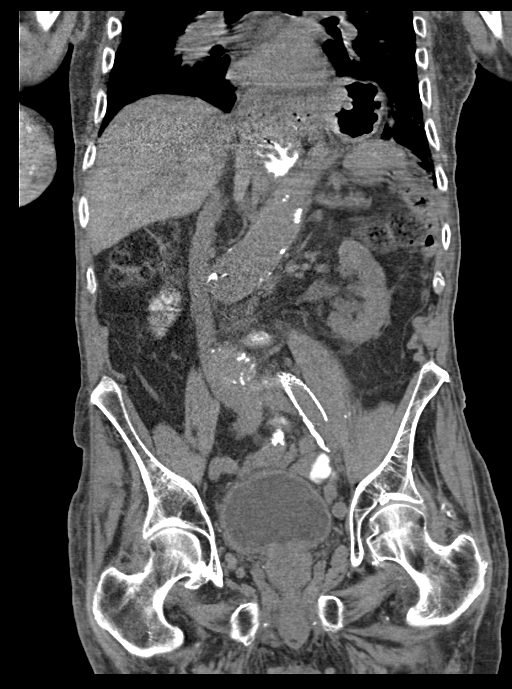

[15 of 46 positions shown; findings below may reference images not displayed]

FINDINGS: Lower chest: Mild interstitial changes over the periphery of the
lung bases. Small granuloma over the right middle lobe. Large hiatal
hernia. Calcified plaque over the coronary arteries and thoracic
aorta. Mild cardiomegaly.

Hepatobiliary: Cholelithiasis. Mild heterogeneity of the liver
without definite focal mass. Biliary tree is within normal.

Pancreas: Normal.

Spleen: Normal.

Adrenals/Urinary Tract: Adrenal glands are normal. Kidneys are
within normal in size without hydronephrosis or nephrolithiasis.
Ureters and bladder are normal.

Stomach/Bowel: Large hiatal hernia. Small bowel is within normal.
Previous appendectomy. Mild fecal retention throughout the colon.
Minimal fluid over the presacral region in the pelvis.

Vascular/Lymphatic: Moderate calcified plaque of the abdominal
aorta. Evidence of patient's known infrarenal aortoiliac bifurcated
stent graft which is unchanged. Patient's native aneurysm sac
measures 9.3 cm (previously 8.8 cm). Known endoleak not well
evaluated on this noncontrast study, although the area of the
endoleak measures approximately 6.2 cm (previously approximately
cm). No adenopathy.

Reproductive: Normal.

Other: Small umbilical hernia containing only peritoneal fat.

Musculoskeletal: Degenerative change of the spine and hips.
IMPRESSION: No acute findings to explain patient's left lower quadrant/left
groin pain.

Evidence of patient's bifurcated aortoiliac stent graft unchanged.
Interval enlargement of native aneurysm sac measuring 9.3 cm
(previously 8.8 cm). Known endoleak not well evaluated on this
noncontrast study but does appear to be larger measuring 6.2 cm
(previously 5.1 cm).

Large hiatal hernia.

Cholelithiasis.

Aortic Atherosclerosis ([id number]). Atherosclerotic coronary
artery disease.

## 2019-07-28 DIAGNOSIS — M6281 Muscle weakness (generalized): Secondary | ICD-10-CM | POA: Diagnosis not present

## 2019-07-28 DIAGNOSIS — R2681 Unsteadiness on feet: Secondary | ICD-10-CM | POA: Diagnosis not present

## 2019-07-28 DIAGNOSIS — Z9181 History of falling: Secondary | ICD-10-CM | POA: Diagnosis not present

## 2019-07-28 DIAGNOSIS — I10 Essential (primary) hypertension: Secondary | ICD-10-CM | POA: Diagnosis not present

## 2019-07-29 ENCOUNTER — Other Ambulatory Visit: Payer: Self-pay | Admitting: *Deleted

## 2019-07-29 DIAGNOSIS — M792 Neuralgia and neuritis, unspecified: Secondary | ICD-10-CM

## 2019-07-29 MED ORDER — OXYCODONE HCL ER 10 MG PO T12A: 10.0000 mg | EXTENDED_RELEASE_TABLET | Freq: Every day | ORAL | 0 refills | Status: DC

## 2019-07-29 NOTE — Telephone Encounter (Signed)
Received fax from [facility] and sent to Dr. Gupta for approval.  

## 2019-08-28 DIAGNOSIS — I10 Essential (primary) hypertension: Secondary | ICD-10-CM | POA: Diagnosis not present

## 2019-08-28 DIAGNOSIS — M6281 Muscle weakness (generalized): Secondary | ICD-10-CM | POA: Diagnosis not present

## 2019-08-28 DIAGNOSIS — R2681 Unsteadiness on feet: Secondary | ICD-10-CM | POA: Diagnosis not present

## 2019-08-28 DIAGNOSIS — Z9181 History of falling: Secondary | ICD-10-CM | POA: Diagnosis not present

## 2019-08-31 ENCOUNTER — Other Ambulatory Visit: Payer: Self-pay

## 2019-08-31 DIAGNOSIS — M792 Neuralgia and neuritis, unspecified: Secondary | ICD-10-CM

## 2019-08-31 NOTE — Telephone Encounter (Signed)
Refill request received from Pharmacy for Oxycontin 10 mg every 12 hours

## 2019-09-01 MED ORDER — OXYCODONE HCL ER 10 MG PO T12A: 10.0000 mg | EXTENDED_RELEASE_TABLET | Freq: Every day | ORAL | 0 refills | Status: DC

## 2019-09-08 ENCOUNTER — Encounter: Payer: Self-pay | Admitting: Nurse Practitioner

## 2019-09-08 ENCOUNTER — Non-Acute Institutional Stay: Payer: Medicare PPO | Admitting: Nurse Practitioner

## 2019-09-08 DIAGNOSIS — N401 Enlarged prostate with lower urinary tract symptoms: Secondary | ICD-10-CM

## 2019-09-08 DIAGNOSIS — K219 Gastro-esophageal reflux disease without esophagitis: Secondary | ICD-10-CM

## 2019-09-08 DIAGNOSIS — T148XXA Other injury of unspecified body region, initial encounter: Secondary | ICD-10-CM | POA: Diagnosis not present

## 2019-09-08 DIAGNOSIS — I10 Essential (primary) hypertension: Secondary | ICD-10-CM | POA: Diagnosis not present

## 2019-09-08 DIAGNOSIS — K5901 Slow transit constipation: Secondary | ICD-10-CM

## 2019-09-08 DIAGNOSIS — N1831 Chronic kidney disease, stage 3a: Secondary | ICD-10-CM | POA: Diagnosis not present

## 2019-09-08 DIAGNOSIS — Z79899 Other long term (current) drug therapy: Secondary | ICD-10-CM | POA: Diagnosis not present

## 2019-09-08 DIAGNOSIS — S40022A Contusion of left upper arm, initial encounter: Secondary | ICD-10-CM | POA: Insufficient documentation

## 2019-09-08 DIAGNOSIS — D638 Anemia in other chronic diseases classified elsewhere: Secondary | ICD-10-CM

## 2019-09-08 DIAGNOSIS — E039 Hypothyroidism, unspecified: Secondary | ICD-10-CM

## 2019-09-08 DIAGNOSIS — R3911 Hesitancy of micturition: Secondary | ICD-10-CM

## 2019-09-08 DIAGNOSIS — M25512 Pain in left shoulder: Secondary | ICD-10-CM | POA: Diagnosis not present

## 2019-09-08 DIAGNOSIS — M79602 Pain in left arm: Secondary | ICD-10-CM | POA: Diagnosis not present

## 2019-09-08 DIAGNOSIS — M8949 Other hypertrophic osteoarthropathy, multiple sites: Secondary | ICD-10-CM | POA: Diagnosis not present

## 2019-09-08 DIAGNOSIS — E871 Hypo-osmolality and hyponatremia: Secondary | ICD-10-CM

## 2019-09-08 DIAGNOSIS — S20212A Contusion of left front wall of thorax, initial encounter: Secondary | ICD-10-CM

## 2019-09-08 DIAGNOSIS — R5382 Chronic fatigue, unspecified: Secondary | ICD-10-CM | POA: Diagnosis not present

## 2019-09-08 DIAGNOSIS — M159 Polyosteoarthritis, unspecified: Secondary | ICD-10-CM

## 2019-09-08 DIAGNOSIS — R269 Unspecified abnormalities of gait and mobility: Secondary | ICD-10-CM

## 2019-09-08 DIAGNOSIS — M25522 Pain in left elbow: Secondary | ICD-10-CM | POA: Diagnosis not present

## 2019-09-08 DIAGNOSIS — E875 Hyperkalemia: Secondary | ICD-10-CM

## 2019-09-08 DIAGNOSIS — S2242XA Multiple fractures of ribs, left side, initial encounter for closed fracture: Secondary | ICD-10-CM

## 2019-09-08 DIAGNOSIS — D509 Iron deficiency anemia, unspecified: Secondary | ICD-10-CM

## 2019-09-08 DIAGNOSIS — E559 Vitamin D deficiency, unspecified: Secondary | ICD-10-CM | POA: Diagnosis not present

## 2019-09-08 DIAGNOSIS — E785 Hyperlipidemia, unspecified: Secondary | ICD-10-CM | POA: Diagnosis not present

## 2019-09-08 DIAGNOSIS — R079 Chest pain, unspecified: Secondary | ICD-10-CM | POA: Diagnosis not present

## 2019-09-08 DIAGNOSIS — J309 Allergic rhinitis, unspecified: Secondary | ICD-10-CM

## 2019-09-08 LAB — BASIC METABOLIC PANEL
BUN: 36 — AB (ref 4–21)
CO2: 27 — AB (ref 13–22)
Chloride: 102 (ref 99–108)
Creatinine: 1.1 (ref 0.6–1.3)
Glucose: 87
Potassium: 5.7 — AB (ref 3.4–5.3)
Sodium: 133 — AB (ref 137–147)

## 2019-09-08 LAB — CBC AND DIFFERENTIAL
HCT: 28 — AB (ref 41–53)
Hemoglobin: 8.9 — AB (ref 13.5–17.5)
Neutrophils Absolute: 4080
Platelets: 198 (ref 150–399)
WBC: 7.5

## 2019-09-08 LAB — HEPATIC FUNCTION PANEL
ALT: 4 — AB (ref 10–40)
AST: 14 (ref 14–40)
Alkaline Phosphatase: 108 (ref 25–125)
Bilirubin, Total: 0.6

## 2019-09-08 LAB — COMPREHENSIVE METABOLIC PANEL
Albumin: 3.6 (ref 3.5–5.0)
Calcium: 8.6 — AB (ref 8.7–10.7)
Globulin: 2.7

## 2019-09-08 LAB — CBC: RBC: 3.09 — AB (ref 3.87–5.11)

## 2019-09-08 NOTE — Assessment & Plan Note (Signed)
Left chest wall, left flank, no pain palpated or with deep breathing, will obtain X-ray left ribs to r/o fxs.

## 2019-09-08 NOTE — Assessment & Plan Note (Signed)
Urinary frequency, continue Finasteride 5mg  qd

## 2019-09-08 NOTE — Assessment & Plan Note (Signed)
09/08/19 serum Na 133, encourage oral intake, repeat BMP

## 2019-09-08 NOTE — Assessment & Plan Note (Signed)
09/08/19 Bun/creat 36/1.10

## 2019-09-08 NOTE — Assessment & Plan Note (Signed)
Stable, Hx of gastric ulcer, continue Omeprazole.

## 2019-09-08 NOTE — Assessment & Plan Note (Signed)
Stable, continue Bisacodyl suppository 10mg  qod, MiraLax bid, Senokot S II qhs

## 2019-09-08 NOTE — Assessment & Plan Note (Signed)
The patient needs assistance/supervision for transfer, he uses powder w/c for mobility.

## 2019-09-08 NOTE — Progress Notes (Signed)
Location:    [facility] Room Number: 32 Place of Service:  ALF [phone]) Provider:  Marlana Latus NP   Virgie Dad, MD  Patient Care Team: Virgie Dad, MD as PCP - General (Internal Medicine) Reyan Helle X, NP as Nurse Practitioner (Internal Medicine)  Extended Emergency Contact Information Primary Emergency Contact: Annia Belt, [place] Phone: [phone] Mobile Phone: [phone] Relation: Son Secondary Emergency Contact: Larence Penning [place] Mobile Phone: [phone] Relation: Grandson  Code Status:  DNR Goals of care: Advanced Directive information Advanced Directives 04/27/2019  Does Patient Have a Medical Advance Directive? Yes  Type of Paramedic of [place];Living will;Out of facility DNR (pink MOST or yellow form)  Does patient want to make changes to medical advance directive? No - Patient declined  Copy of [place] in Chart? Yes - validated most recent copy scanned in chart (See row information)  Would patient like information on creating a medical advance directive? -  Pre-existing out of facility DNR order (yellow form or pink MOST form) Pink MOST form placed in chart (order not valid for inpatient use);Yellow form placed in chart (order not valid for inpatient use)     Chief Complaint  Patient presents with  . Acute Visit    Bruise    HPI:  Pt is a 86 y.o. male seen today for an acute visit for a traumatic contusion to the left arm, left chest wall, left flank, c/o pain in the left upper arm.   The patient takes Tylenol '1000mg'$  bid, '500mg'$  q8h prn, Gabapentin '100mg'$  qd, Lidocaine patch, Oxycodone '10mg'$  qd for osteoarthritic pain and peripheral neuropathy.   Constipation, takes Bisacodyl suppository '10mg'$  qod, MiraLax bid, Senokot S II qhs  Urinary frequency, takes Finasteride '5mg'$  qd  Allergic rhinitis, takes Flonase  qd  Hypothyroidism, takes Levothyroxine 47mg qd.   GERD, takes Omeprazole '20mg'$  qd     Past Medical History:  Diagnosis Date  . AAA (abdominal aortic aneurysm) ([facility]   . Abnormality of gait 08/01/2010  . Adhesive capsulitis of shoulder 08/01/2010   "Frozen" right shoulder  . Anemia   . Anxiety state, unspecified 08/01/2010  . Arthritis   . Congestive heart failure, unspecified 08/01/2010  . Cough 08/07/2015  . Disorders of bursae and tendons in shoulder region, unspecified 08/01/2010  . Edema 08/01/2010  . Fall   . Fecal impaction ([facility]   . GERD (gastroesophageal reflux disease)   . Hiatal hernia   . Hypercalcemia   . Hyperlipidemia   . Hypertension   . Hypertrophy of prostate without urinary obstruction and other lower urinary tract symptoms (LUTS) 08/01/2010  . Hyponatremia   . Hypothyroidism   . Infectious colitis   . Insomnia, unspecified 08/01/2010  . Joint pain   . Leg pain    with walking  . Lumbago 12/24/2010  . Orthostatic hypotension 11/05/2010  . Other specified cardiac dysrhythmias(427.89) 12/24/2010  . Pain in joint, pelvic region and thigh 08/06/2010  . Peptic ulcer, unspecified site, unspecified as acute or chronic, without mention of hemorrhage, perforation, or obstruction 08/01/2010  . Renal failure, acute ([facility]   . Swelling, mass, or lump in head and neck 01/03/2011  . Ulcer    Stomach  . Unspecified disorder of kidney and ureter 08/01/2010  . Unspecified hearing loss 08/01/2010  . Unspecified vitamin D deficiency 08/06/2010  . Urinary frequency 09/13/2010  .  Vertigo    Past Surgical History:  Procedure Laterality Date  . ABDOMINAL AORTIC ANEURYSM REPAIR  12-18-10   Stent graft repair of AAA  . APPENDECTOMY    . CATARACT EXTRACTION    . HEMORRHOID SURGERY  2000  . INGUINAL HERNIA REPAIR Left 2005  . TRANSTHORACIC ECHOCARDIOGRAM  07/19/2010    Left ventricle: There is hypokinesis of the inferior wall and  posterior wall. The EF is 35-40%. The cavity size was  normal    Allergies  Allergen Reactions  . Aleve [Naproxen] Other (See Comments)    Per MAR  . Indocin [Indomethacin] Other (See Comments)    Per MAR    Allergies as of 09/08/2019      Reactions   Aleve [naproxen] Other (See Comments)   Per MAR   Indocin [indomethacin] Other (See Comments)   Per [facility]      Medication List       Accurate as of September 08, 2019 11:59 PM. If you have any questions, ask your nurse or doctor.        acetaminophen 500 MG tablet Commonly known as: TYLENOL Take 1,000 mg by mouth 2 (two) times daily.   acetaminophen 500 MG tablet Commonly known as: TYLENOL Take 500 mg by mouth every 8 (eight) hours as needed.   Aspercreme Lidocaine 4 % Ptch Generic drug: Lidocaine Apply 1 patch topically See admin instructions. 1 patch applied once a day to mid lower back   benzocaine 10 % mucosal gel Commonly known as: ORAJEL 1 application. May keep at bedside and self-administer. Apply with cotton applicator to mouth and/or throat for mouth pain as needed.   bisacodyl 10 MG suppository Commonly known as: DULCOLAX Place 10 mg rectally every other day. For constipation Hold for loose stools   Cholecalciferol 25 MCG (1000 UT) tablet Take 2,000 Units by mouth daily.   DEXTRAN 70-HYPROMELLOSE (PF) OP Place 1 drop into both eyes as needed (for dry eyes). wait 3-5 minutes between eye drops   finasteride 5 MG tablet Commonly known as: PROSCAR Take 5 mg by mouth daily. Women who are pregnant or may become pregnant should use precautions when handling this medication.   fluocinonide 0.05 % external solution Commonly known as: LIDEX Apply 1 application topically as needed. apply to scalp for itching   fluticasone 50 MCG/ACT nasal spray Commonly known as: FLONASE Place 2 sprays into both nostrils at bedtime.   gabapentin 100 MG capsule Commonly known as: NEURONTIN Take 100 mg by mouth daily.   hydrocortisone 1 % lotion Apply 1 application topically 2 (two)  times daily as needed for itching.   ketoconazole 2 % shampoo Commonly known as: NIZORAL Apply 1 application topically once a week. On Tues.   levothyroxine 50 MCG tablet Commonly known as: SYNTHROID Take 50 mcg by mouth daily before breakfast.   magic mouthwash w/lidocaine Soln Take 5-10 mLs by mouth every 4 (four) hours as needed for mouth pain.   magnesium citrate Soln Take by mouth as needed for severe constipation. Per ED physician resident may take 10 oz by mouth followed by 8 oz of water as needed every 4th day if no bowel movement in 3 days   Mintox [id number] MG/5ML suspension Generic drug: alum & mag hydroxide-simeth Take 30 mLs by mouth every 4 (four) hours as needed for indigestion or heartburn. PRN indigestion/stomach upset x 48 hours. Notify MD if symptoms continue. DO NOT USE FOR CHRONIC KIDNEY DISEASE STAGE FOUR OR HEMODIALYSIS PATIENTS.  OcuSoft Eyelid Cleansing Pads Apply topically. 1 pad to each eye once a morning   omeprazole 20 MG tablet Commonly known as: PRILOSEC OTC Take 20 mg by mouth daily.   oxyCODONE 10 mg 12 hr tablet Commonly known as: OxyCONTIN Take 1 tablet (10 mg total) by mouth daily. At 6am. Do Not Crush   polyethylene glycol 17 g packet Commonly known as: MIRALAX / GLYCOLAX Take 17 g by mouth 2 (two) times daily. Mix in 4-8 ounces of fluids for constipation,Hold for loose stools   sennosides-docusate sodium 8.6-50 MG tablet Commonly known as: SENOKOT-S Take 2 tablets by mouth at bedtime.   SleepRight Breathe Aid Misc 1 strip at bedtime.   sodium chloride 0.65 % Soln nasal spray Commonly known as: OCEAN Place 1 spray into both nostrils 3 (three) times daily as needed for congestion.   triamcinolone 0.1 % paste Commonly known as: KENALOG Use as directed 1 application in the mouth or throat every 4 (four) hours as needed.   TUSSIN COUGH DM PO Take 5 mLs by mouth every 4 (four) hours as needed.       Review of Systems   Constitutional: Negative for appetite change, fatigue and fever.  HENT: Positive for hearing loss. Negative for congestion and voice change.   Eyes: Negative for visual disturbance.  Respiratory: Positive for shortness of breath. Negative for cough and wheezing.        DOE  Cardiovascular: Negative for chest pain, palpitations and leg swelling.  Gastrointestinal: Negative for abdominal pain, constipation, nausea and vomiting.  Genitourinary: Negative for difficulty urinating and dysuria.  Musculoskeletal: Positive for arthralgias, back pain and gait problem.  Skin: Negative for color change and pallor.       Contusion to the left arm, left chest wall, left flank.   Neurological: Positive for weakness. Negative for speech difficulty and headaches.  Psychiatric/Behavioral: Negative for behavioral problems and sleep disturbance. The patient is not nervous/anxious.     Immunization History  Administered Date(s) Administered  . Influenza, High Dose Seasonal PF 12/21/2018  . Influenza-Unspecified 12/27/2010, 12/29/2011, 12/27/2012, 12/13/2013, 12/14/2014, 12/20/2015, 12/17/2016, 12/14/2017  . Moderna SARS-COVID-2 Vaccination 03/14/2019, 04/11/2019  . PPD Test 07/30/2010, 08/14/2010  . Pneumococcal Conjugate-13 11/04/2016  . Pneumococcal Polysaccharide-23 03/10/2005  . Td 03/11/1999  . Tdap 10/31/2016  . Zoster Recombinat (Shingrix) 11/12/2018   Pertinent  Health Maintenance Due  Topic Date Due  . INFLUENZA VACCINE  10/09/2019  . PNA vac Low Risk Adult  Completed   Fall Risk  11/04/2017 10/29/2016 08/07/2015 08/07/2015 04/10/2015  Falls in the past year? No Yes Yes No No  Comment - - 06/02/15- skin tear right arm - -  Number falls in past yr: - 1 - - -  Injury with Fall? - No - - -   Functional Status Survey:    Vitals:   09/08/19 1012  BP: 120/70  Pulse: 78  Resp: 18  Temp: 97.7 F (36.5 C)  SpO2: 96%  Weight: 125 lb 12.8 oz (57.1 kg)  Height: '5\' 9"'$  (1.753 m)   Body mass  index is 18.58 kg/m. Physical Exam Vitals and nursing note reviewed.  Constitutional:      Appearance: Normal appearance.     Comments: Frail.   HENT:     Head: Normocephalic.     Mouth/Throat:     Mouth: Mucous membranes are moist.  Eyes:     Extraocular Movements: Extraocular movements intact.     Conjunctiva/sclera: Conjunctivae normal.  Pupils: Pupils are equal, round, and reactive to light.  Cardiovascular:     Rate and Rhythm: Normal rate and regular rhythm.     Heart sounds: Murmur heard.   Pulmonary:     Breath sounds: Rales present. No wheezing or rhonchi.     Comments: Bibasilar rales.  Abdominal:     General: Bowel sounds are normal. There is no distension.     Palpations: Abdomen is soft.     Tenderness: There is no abdominal tenderness. There is no right CVA tenderness, left CVA tenderness, guarding or rebound.  Musculoskeletal:     Cervical back: Normal range of motion.     Right lower leg: No edema.     Left lower leg: No edema.     Comments: R+L should pain with resistance, decreased ROM with both flexion and extension. No long ambulatory. Transfers self from/to bed/power wheelchair/toilet.  Skin:    General: Skin is warm and dry.     Findings: Bruising present.     Comments: Entire ulnar aspect left arm bruise with endurated hematoma/swelling in the center of the contusion, pain palpated upper arm below the shoulder. Contusion/bruise lateral chest wall extended to the flank, anterior and posterior left chest wall, no pain palpated or with deep breathing.   Neurological:     General: No focal deficit present.     Mental Status: He is alert. Mental status is at baseline.     Motor: Weakness present.     Coordination: Coordination normal.     Gait: Gait abnormal.     Comments: Oriented to person, place. Lower body weakness.   Psychiatric:        Mood and Affect: Mood normal.        Behavior: Behavior normal.        Thought Content: Thought content normal.         Judgment: Judgment normal.     Labs reviewed: Recent Labs    09/30/18 0000 11/03/18 0000 04/28/19 0000  NA 138 135* 138  K 5.3 5.0 4.2  CL 109 107 108  CO2 '18 21 22  '$ BUN 50* 46* 31*  CREATININE 1.1 1.2 1.0  CALCIUM 8.6 8.5 8.5*   Recent Labs    11/03/18 0000 04/28/19 0000  AST 14 13*  ALT 6* 6*  ALKPHOS 132* 136*  PROT 6.2*  --   ALBUMIN 3.7 3.4*   Recent Labs    11/03/18 0000 04/28/19 0000  WBC 7.2 6.2  NEUTROABS 4,550 4,104  HGB 9.6* 9.7*  HCT 30* 30*  PLT 203 209   Lab Results  Component Value Date   TSH 1.89 04/28/2019   Lab Results  Component Value Date   HGBA1C 5.6 04/06/2017   Lab Results  Component Value Date   CHOL 145 04/06/2017   HDL 47 04/06/2017   LDLCALC 89 04/06/2017   TRIG 47 04/06/2017   CHOLHDL 3.1 04/06/2017    Significant Diagnostic Results in last 30 days:  No results found.  Assessment/Plan Contusion of left arm A large black blue bruise, hematoma left arm, c/o pain in the left upper arm, able to move shoulder, elbow, and wrist, will obtain X-ray L shoulder, humerus, elbow to r/o fxs, obtain CBC/diff, CMP/eGFR to evaluate further.  09/08/19 X-ray left elbow, left humerus, left ribs, left shoulder, left 10th and 11th ribs fx indeterminate age.   Contusion of left chest wall Left chest wall, left flank, no pain palpated or with deep breathing, will obtain X-ray  left ribs to r/o fxs.   Osteoarthritis Multiple sites, continue Tylenol '1000mg'$  bid, '500mg'$  q8h prn, Gabapentin '100mg'$  qd, Lidocaine patch, Oxycodone '10mg'$  qd for osteoarthritic pain and peripheral neuropathy.    BPH (benign prostatic hyperplasia) Urinary frequency, continue Finasteride '5mg'$  qd   Chronic kidney disease, stage III (moderate) ([place]) 09/08/19 Bun/creat 36/1.10  Anemia of chronic disease Worsened, 09/08/19 Hgb 8.9, traumatic contusion to the left arm, left chest wall is contributory, continue to observe.   Constipation Stable, continue Bisacodyl  suppository '10mg'$  qod, MiraLax bid, Senokot S II qhs   Gait abnormality The patient needs assistance/supervision for transfer, he uses powder w/c for mobility.   Hypothyroidism TSH 1.89 04/28/19, continue Levothyroxine.   GERD Stable, Hx of gastric ulcer, continue Omeprazole.   Allergic rhinitis Stable, continue Flonase.   Hyperkalemia 09/08/19 K 5.7, will Kayexalate 15gm po x2, the repeat BMP   Hyponatremia 09/08/19 serum Na 133, encourage oral intake, repeat BMP  Rib fractures 09/08/19 X-ray left elbow, left humerus, left ribs, left shoulder, left 10th and 11th ribs fx indeterminate age.  Manage pain, observe  Iron deficiency anemia 09/08/19 wbc 7.5, Hgb 8.9(dropped from 9.7 04/28/19)-blood loss from contusion/usbcutanous is contributory, observe.      Family/ staff Communication: plan of care reviewed with the patient and charge nurse.   Labs/tests ordered:  CBC/diff, CMP/eGFR, X-ray left ribs, left shoulder, humerus, elbow  Time spend 40 minutes.

## 2019-09-08 NOTE — Assessment & Plan Note (Signed)
Stable, continue Flonase.  

## 2019-09-08 NOTE — Assessment & Plan Note (Signed)
Worsened, 09/08/19 Hgb 8.9, traumatic contusion to the left arm, left chest wall is contributory, continue to observe.

## 2019-09-08 NOTE — Assessment & Plan Note (Addendum)
A large black blue bruise, hematoma left arm, c/o pain in the left upper arm, able to move shoulder, elbow, and wrist, will obtain X-ray L shoulder, humerus, elbow to r/o fxs, obtain CBC/diff, CMP/eGFR to evaluate further.  09/08/19 X-ray left elbow, left humerus, left ribs, left shoulder, left 10th and 11th ribs fx indeterminate age.

## 2019-09-08 NOTE — Assessment & Plan Note (Signed)
TSH 1.89 04/28/19, continue Levothyroxine.

## 2019-09-08 NOTE — Assessment & Plan Note (Signed)
09/08/19 K 5.7, will Kayexalate 15gm po x2, the repeat BMP

## 2019-09-08 NOTE — Assessment & Plan Note (Signed)
Multiple sites, continue Tylenol 1000mg  bid, 500mg  q8h prn, Gabapentin 100mg  qd, Lidocaine patch, Oxycodone 10mg  qd for osteoarthritic pain and peripheral neuropathy.

## 2019-09-13 ENCOUNTER — Encounter: Payer: Self-pay | Admitting: Nurse Practitioner

## 2019-09-13 DIAGNOSIS — S2249XA Multiple fractures of ribs, unspecified side, initial encounter for closed fracture: Secondary | ICD-10-CM | POA: Insufficient documentation

## 2019-09-13 DIAGNOSIS — I10 Essential (primary) hypertension: Secondary | ICD-10-CM | POA: Diagnosis not present

## 2019-09-13 LAB — BASIC METABOLIC PANEL
BUN: 33 — AB (ref 4–21)
CO2: 23 — AB (ref 13–22)
Chloride: 103 (ref 99–108)
Creatinine: 1.2 (ref 0.6–1.3)
Glucose: 151
Potassium: 4.5 (ref 3.4–5.3)
Sodium: 135 — AB (ref 137–147)

## 2019-09-13 LAB — COMPREHENSIVE METABOLIC PANEL: Calcium: 8.4 — AB (ref 8.7–10.7)

## 2019-09-13 NOTE — Assessment & Plan Note (Signed)
09/08/19 X-ray left elbow, left humerus, left ribs, left shoulder, left 10th and 11th ribs fx indeterminate age.  Manage pain, observe

## 2019-09-13 NOTE — Assessment & Plan Note (Signed)
09/08/19 wbc 7.5, Hgb 8.9(dropped from 9.7 04/28/19)-blood loss from contusion/usbcutanous is contributory, observe.

## 2019-09-27 DIAGNOSIS — R2681 Unsteadiness on feet: Secondary | ICD-10-CM | POA: Diagnosis not present

## 2019-09-27 DIAGNOSIS — I10 Essential (primary) hypertension: Secondary | ICD-10-CM | POA: Diagnosis not present

## 2019-09-27 DIAGNOSIS — M6281 Muscle weakness (generalized): Secondary | ICD-10-CM | POA: Diagnosis not present

## 2019-09-27 DIAGNOSIS — Z9181 History of falling: Secondary | ICD-10-CM | POA: Diagnosis not present

## 2019-09-28 ENCOUNTER — Other Ambulatory Visit: Payer: Self-pay | Admitting: *Deleted

## 2019-09-28 DIAGNOSIS — M792 Neuralgia and neuritis, unspecified: Secondary | ICD-10-CM

## 2019-09-28 MED ORDER — OXYCODONE HCL ER 10 MG PO T12A: 10.0000 mg | EXTENDED_RELEASE_TABLET | Freq: Every day | ORAL | 0 refills | Status: DC

## 2019-09-28 NOTE — Telephone Encounter (Signed)
Received refill Request from [facility].  Pended Rx and sent to [facility] for approval.  (Dr. Chales Abrahams out of office)

## 2019-10-09 ENCOUNTER — Encounter (HOSPITAL_COMMUNITY): Payer: Self-pay

## 2019-10-09 ENCOUNTER — Emergency Department (HOSPITAL_COMMUNITY): Payer: Medicare PPO

## 2019-10-09 ENCOUNTER — Other Ambulatory Visit: Payer: Self-pay

## 2019-10-09 ENCOUNTER — Emergency Department (HOSPITAL_COMMUNITY)
Admission: EM | Admit: 2019-10-09 | Discharge: 2019-10-09 | Disposition: A | Discharge status: Home / Self Care | Payer: Medicare PPO | Attending: Emergency Medicine | Admitting: Emergency Medicine

## 2019-10-09 DIAGNOSIS — R609 Edema, unspecified: Secondary | ICD-10-CM | POA: Diagnosis not present

## 2019-10-09 DIAGNOSIS — E039 Hypothyroidism, unspecified: Secondary | ICD-10-CM | POA: Insufficient documentation

## 2019-10-09 DIAGNOSIS — E785 Hyperlipidemia, unspecified: Secondary | ICD-10-CM | POA: Diagnosis not present

## 2019-10-09 DIAGNOSIS — R404 Transient alteration of awareness: Secondary | ICD-10-CM | POA: Diagnosis not present

## 2019-10-09 DIAGNOSIS — Y999 Unspecified external cause status: Secondary | ICD-10-CM | POA: Insufficient documentation

## 2019-10-09 DIAGNOSIS — S199XXA Unspecified injury of neck, initial encounter: Secondary | ICD-10-CM | POA: Diagnosis not present

## 2019-10-09 DIAGNOSIS — Y92009 Unspecified place in unspecified non-institutional (private) residence as the place of occurrence of the external cause: Secondary | ICD-10-CM | POA: Diagnosis not present

## 2019-10-09 DIAGNOSIS — R29898 Other symptoms and signs involving the musculoskeletal system: Secondary | ICD-10-CM | POA: Diagnosis not present

## 2019-10-09 DIAGNOSIS — S82102A Unspecified fracture of upper end of left tibia, initial encounter for closed fracture: Secondary | ICD-10-CM | POA: Diagnosis not present

## 2019-10-09 DIAGNOSIS — I10 Essential (primary) hypertension: Secondary | ICD-10-CM | POA: Diagnosis not present

## 2019-10-09 DIAGNOSIS — S82202A Unspecified fracture of shaft of left tibia, initial encounter for closed fracture: Secondary | ICD-10-CM

## 2019-10-09 DIAGNOSIS — Z79899 Other long term (current) drug therapy: Secondary | ICD-10-CM | POA: Insufficient documentation

## 2019-10-09 DIAGNOSIS — S82832A Other fracture of upper and lower end of left fibula, initial encounter for closed fracture: Secondary | ICD-10-CM | POA: Diagnosis not present

## 2019-10-09 DIAGNOSIS — I6782 Cerebral ischemia: Secondary | ICD-10-CM | POA: Insufficient documentation

## 2019-10-09 DIAGNOSIS — M2550 Pain in unspecified joint: Secondary | ICD-10-CM | POA: Diagnosis not present

## 2019-10-09 DIAGNOSIS — N183 Chronic kidney disease, stage 3 unspecified: Secondary | ICD-10-CM | POA: Insufficient documentation

## 2019-10-09 DIAGNOSIS — G4489 Other headache syndrome: Secondary | ICD-10-CM | POA: Diagnosis not present

## 2019-10-09 DIAGNOSIS — Z66 Do not resuscitate: Secondary | ICD-10-CM | POA: Insufficient documentation

## 2019-10-09 DIAGNOSIS — S82402A Unspecified fracture of shaft of left fibula, initial encounter for closed fracture: Secondary | ICD-10-CM | POA: Diagnosis not present

## 2019-10-09 DIAGNOSIS — I130 Hypertensive heart and chronic kidney disease with heart failure and stage 1 through stage 4 chronic kidney disease, or unspecified chronic kidney disease: Secondary | ICD-10-CM | POA: Insufficient documentation

## 2019-10-09 DIAGNOSIS — S82392A Other fracture of lower end of left tibia, initial encounter for closed fracture: Secondary | ICD-10-CM | POA: Diagnosis not present

## 2019-10-09 DIAGNOSIS — S0990XA Unspecified injury of head, initial encounter: Secondary | ICD-10-CM | POA: Diagnosis not present

## 2019-10-09 DIAGNOSIS — S79912A Unspecified injury of left hip, initial encounter: Secondary | ICD-10-CM | POA: Diagnosis not present

## 2019-10-09 DIAGNOSIS — R58 Hemorrhage, not elsewhere classified: Secondary | ICD-10-CM | POA: Diagnosis not present

## 2019-10-09 DIAGNOSIS — W19XXXA Unspecified fall, initial encounter: Secondary | ICD-10-CM | POA: Insufficient documentation

## 2019-10-09 DIAGNOSIS — Y9389 Activity, other specified: Secondary | ICD-10-CM | POA: Insufficient documentation

## 2019-10-09 DIAGNOSIS — S79911A Unspecified injury of right hip, initial encounter: Secondary | ICD-10-CM | POA: Diagnosis not present

## 2019-10-09 DIAGNOSIS — I509 Heart failure, unspecified: Secondary | ICD-10-CM | POA: Insufficient documentation

## 2019-10-09 DIAGNOSIS — S8992XA Unspecified injury of left lower leg, initial encounter: Secondary | ICD-10-CM | POA: Diagnosis present

## 2019-10-09 DIAGNOSIS — Z7401 Bed confinement status: Secondary | ICD-10-CM | POA: Diagnosis not present

## 2019-10-09 NOTE — Discharge Instructions (Signed)
Work-up head CT CT neck without any acute findings.  X-rays of pelvis and both hips without any abnormalities.  X-ray of the left ankle shows a distal tib-fib fracture.  Treated with a posterior and stirrup splint.  Dr. Everardo Pacific from orthopedics will follow him up in the office for permanent casting.  Patient stable for return back to nursing facility.  Pain medication as per nursing facility.  Patient has not needed any here.

## 2019-10-09 NOTE — ED Notes (Signed)
Pt discharged from this ED in stable condition at this time. All discharge instructions and follow up care reviewed with NH nurse, pt [facility]

## 2019-10-09 NOTE — ED Notes (Signed)
Report called to [facility] and [facility] called for transport

## 2019-10-09 NOTE — ED Provider Notes (Signed)
[facility]-EMERGENCY DEPT Provider Note   CSN: [id number] Arrival date & time: 10/09/19  1304     History Chief Complaint  Patient presents with  . Fall    Ricardo [name] is a 86 y.o. male.  [facility].  Patient essentially nonambulatory according to his son.  Very hard of hearing.  He must of try to get out of bed on his own and fell.  Has deformity to the left leg.  Arrived with cervical collar in place.  Fall was unwitnessed.  Patient apparently at nursing facility was complaining of headache and left ankle pain.  Obvious deformity to the left ankle.  Patient is a DNR.        Past Medical History:  Diagnosis Date  . AAA (abdominal aortic aneurysm) (HCC)   . Abnormality of gait 08/01/2010  . Adhesive capsulitis of shoulder 08/01/2010   "Frozen" right shoulder  . Anemia   . Anxiety state, unspecified 08/01/2010  . Arthritis   . Congestive heart failure, unspecified 08/01/2010  . Cough 08/07/2015  . Disorders of bursae and tendons in shoulder region, unspecified 08/01/2010  . Edema 08/01/2010  . Fall   . Fecal impaction (HCC)   . GERD (gastroesophageal reflux disease)   . Hiatal hernia   . Hypercalcemia   . Hyperlipidemia   . Hypertension   . Hypertrophy of prostate without urinary obstruction and other lower urinary tract symptoms (LUTS) 08/01/2010  . Hyponatremia   . Hypothyroidism   . Infectious colitis   . Insomnia, unspecified 08/01/2010  . Joint pain   . Leg pain    with walking  . Lumbago 12/24/2010  . Orthostatic hypotension 11/05/2010  . Other specified cardiac dysrhythmias(427.89) 12/24/2010  . Pain in joint, pelvic region and thigh 08/06/2010  . Peptic ulcer, unspecified site, unspecified as acute or chronic, without mention of hemorrhage, perforation, or obstruction 08/01/2010  . Renal failure, acute (HCC)   . Swelling, mass, or lump in head and neck 01/03/2011  . Ulcer    Stomach  . Unspecified  disorder of kidney and ureter 08/01/2010  . Unspecified hearing loss 08/01/2010  . Unspecified vitamin D deficiency 08/06/2010  . Urinary frequency 09/13/2010  . Vertigo     Patient Active Problem List   Diagnosis Date Noted  . Rib fractures 09/13/2019  . Contusion of left arm 09/08/2019  . Contusion of left chest wall 09/08/2019  . Hyperkalemia 09/08/2019  . Hyponatremia 09/08/2019  . Encounter for power mobility device assessment 04/15/2019  . Allergic rhinitis 03/29/2019  . Mild memory disturbances associated with senile brain disease (HCC) 03/10/2019  . SBO (small bowel obstruction) (HCC) 04/23/2017  . Protein-calorie malnutrition, severe 04/07/2017  . Nausea & vomiting 04/05/2017  . Tachycardia 04/05/2017  . Neuropathic pain 12/08/2016  . Anemia of chronic disease 12/08/2016  . DNR (do not resuscitate)   . Edema 03/27/2015  . Bradycardia by electrocardiogram 09/22/2014  . Spinal stenosis in cervical region 09/06/2013  . Deaf 06/21/2013  . Lumbago 06/21/2013  . Constipation 03/08/2013  . Hypothyroidism 03/08/2013  . BPH (benign prostatic hyperplasia) 03/08/2013  . Depression with anxiety 03/08/2013  . Fall at nursing home 03/08/2013  . AAA (abdominal aortic aneurysm) (HCC) 01/27/2012  . Gait abnormality 08/01/2010  . Osteoarthritis 05/17/2009  . GERD 03/16/2009  . ULCER-GASTRIC 03/16/2009  . DIVERTICULOSIS-COLON 03/16/2009  . Dyslipidemia 01/30/2009  . Iron deficiency anemia 01/30/2009  . Essential hypertension 01/30/2009  . VERTIGO 01/30/2009  .  Chronic kidney disease, stage III (moderate) (HCC) 01/30/2009    Past Surgical History:  Procedure Laterality Date  . ABDOMINAL AORTIC ANEURYSM REPAIR  12-18-10   Stent graft repair of AAA  . APPENDECTOMY    . CATARACT EXTRACTION    . HEMORRHOID SURGERY  2000  . INGUINAL HERNIA REPAIR Left 2005  . TRANSTHORACIC ECHOCARDIOGRAM  07/19/2010    Left ventricle: There is hypokinesis of the inferior wall and  posterior wall.  The EF is 35-40%. The cavity size was normal       Family History  Problem Relation Age of Onset  . Heart failure Brother     Social History   Tobacco Use  . Smoking status: Never Smoker  . Smokeless tobacco: Never Used  Vaping Use  . Vaping Use: Never used  Substance Use Topics  . Alcohol use: Yes    Alcohol/week: 0.0 standard drinks    Comment: 1 1/2 oz daily   . Drug use: No    Home Medications Prior to Admission medications   Medication Sig Start Date End Date Taking? Authorizing Provider  acetaminophen (TYLENOL) 500 MG tablet Take 1,000 mg by mouth 2 (two) times daily.     [provider]  acetaminophen (TYLENOL) 500 MG tablet Take 500 mg by mouth every 8 (eight) hours as needed.     [provider]  alum & mag hydroxide-simeth (MINTOX) 200-200-20 MG/5ML suspension Take 30 mLs by mouth every 4 (four) hours as needed for indigestion or heartburn. PRN indigestion/stomach upset x 48 hours. Notify MD if symptoms continue. DO NOT USE FOR CHRONIC KIDNEY DISEASE STAGE FOUR OR HEMODIALYSIS PATIENTS.    [provider]  benzocaine (ORAJEL) 10 % mucosal gel 1 application. May keep at bedside and self-administer. Apply with cotton applicator to mouth and/or throat for mouth pain as needed.    [provider]  bisacodyl (DULCOLAX) 10 MG suppository Place 10 mg rectally every other day. For constipation Hold for loose stools    [provider]  Cholecalciferol 25 MCG (1000 UT) tablet Take 2,000 Units by mouth daily.     [provider]  DEXTRAN 70-HYPROMELLOSE, PF, OP Place 1 drop into both eyes as needed (for dry eyes). wait 3-5 minutes between eye drops    [provider]  Dextromethorphan-Guaifenesin (TUSSIN COUGH DM PO) Take 5 mLs by mouth every 4 (four) hours as needed.    [provider]  Eyelid Cleansers (OCUSOFT EYELID CLEANSING) PADS Apply topically. 1 pad to each eye once a morning    [provider]   finasteride (PROSCAR) 5 MG tablet Take 5 mg by mouth daily. Women who are pregnant or may become pregnant should use precautions when handling this medication. 01/25/13   [provider]  fluocinonide (LIDEX) 0.05 % external solution Apply 1 application topically as needed. apply to scalp for itching    [provider]  fluticasone (FLONASE) 50 MCG/ACT nasal spray Place 2 sprays into both nostrils at bedtime.     [provider]  gabapentin (NEURONTIN) 100 MG capsule Take 100 mg by mouth daily.    [provider]  hydrocortisone 1 % lotion Apply 1 application topically 2 (two) times daily as needed for itching.     [provider]  ketoconazole (NIZORAL) 2 % shampoo Apply 1 application topically once a week. On Tues.    [provider]  levothyroxine (SYNTHROID) 50 MCG tablet Take 50 mcg by mouth daily before breakfast.  [provider]  Lidocaine (ASPERCREME LIDOCAINE) 4 % PTCH Apply 1 patch topically See admin instructions. 1 patch applied once a day to mid lower back    [provider]  magic mouthwash w/lidocaine SOLN Take 5-10 mLs by mouth every 4 (four) hours as needed for mouth pain.    [provider]  magnesium citrate SOLN Take by mouth as needed for severe constipation. Per ED physician resident may take 10 oz by mouth followed by 8 oz of water as needed every 4th day if no bowel movement in 3 days    [provider]  Misc. Devices (SLEEPRIGHT BREATHE AID) MISC 1 strip at bedtime.    [provider]  omeprazole (PRILOSEC OTC) 20 MG tablet Take 20 mg by mouth daily. 10/30/18   [provider]  oxyCODONE (OXYCONTIN) 10 mg 12 hr tablet Take 1 tablet (10 mg total) by mouth daily. At 6am. Do Not Crush 09/28/19   Mast, Man X, NP  polyethylene glycol (MIRALAX / GLYCOLAX) packet Take 17 g by mouth 2 (two) times daily. Mix in 4-8 ounces of fluids for constipation,Hold for loose stools     [provider]  sennosides-docusate sodium (SENOKOT-S) 8.6-50 MG tablet Take 2 tablets by mouth at bedtime.    [provider]  sodium chloride (OCEAN) 0.65 % SOLN nasal spray Place 1 spray into both nostrils 3 (three) times daily as needed for congestion.    [provider]  triamcinolone (KENALOG) 0.1 % paste Use as directed 1 application in the mouth or throat every 4 (four) hours as needed.    [provider]    Allergies    Aleve [naproxen] and Indocin [indomethacin]  Review of Systems   Review of Systems  Unable to perform ROS: Dementia    Physical Exam Updated Vital Signs BP (!) 189/128 (BP Location: Right Arm)   Pulse 65   Temp 98.9 F (37.2 C) (Rectal)   Resp 14   Ht 1.753 m (5\' 9" )   Wt 57.1 kg   SpO2 93%   BMI 18.59 kg/m   Physical Exam Vitals and nursing note reviewed.  Constitutional:      Appearance: Normal appearance. He is well-developed.  HENT:     Head: Normocephalic and atraumatic.  Eyes:     Extraocular Movements: Extraocular movements intact.     Conjunctiva/sclera: Conjunctivae normal.     Pupils: Pupils are equal, round, and reactive to light.  Neck:     Comments: Cervical collar in place. Cardiovascular:     Rate and Rhythm: Normal rate and regular rhythm.     Heart sounds: No murmur heard.   Pulmonary:     Effort: Pulmonary effort is normal. No respiratory distress.     Breath sounds: Normal breath sounds.  Abdominal:     Palpations: Abdomen is soft.     Tenderness: There is no abdominal tenderness.  Musculoskeletal:        General: Deformity present.     Comments: Left ankle with deformity.  Cap refill is less than 2 seconds.  Good movement of toes.  At the area of deformity no significant swelling or bruising.  Skin:    General: Skin is warm and dry.     Capillary Refill: Capillary refill takes less than 2 seconds.  Neurological:     Mental Status: He is alert. Mental status is at baseline.      Comments: Patient awake.  But apparently very hard of hearing.  ED Results / Procedures / Treatments   Labs (all labs ordered are listed, but only abnormal results are displayed) Labs Reviewed - No data to display  EKG None  Radiology DG Ankle Complete Left  Result Date: 10/09/2019 CLINICAL DATA:  Unwitnessed fall.  Left ankle deformity EXAM: LEFT ANKLE COMPLETE - 3+ VIEW COMPARISON:  None. FINDINGS: Acute comminuted fractures of the distal left tibial and fibular metadiaphyses. Both fractures are minimally posteriorly displaced with mild valgus and anterior apex angulation. Both fractures appear to be extra-articular. The ankle mortise is congruent. Bones are diffusely demineralized. Relatively mild soft tissue swelling. Advanced vascular calcifications. IMPRESSION: Acute comminuted fractures of the distal left tibial and fibular metadiaphyses. Both fractures are minimally posteriorly displaced and mildly angulated. Electronically Signed   By: Duanne Guess D.O.   On: 10/09/2019 15:34   CT Head Wo Contrast  Result Date: 10/09/2019 CLINICAL DATA:  Neck trauma. Un witnessed fall without neurologic deficit by report. EXAM: CT HEAD WITHOUT CONTRAST CT CERVICAL SPINE WITHOUT CONTRAST TECHNIQUE: Multidetector CT imaging of the head and cervical spine was performed following the standard protocol without intravenous contrast. Multiplanar CT image reconstructions of the cervical spine were also generated. COMPARISON:  Jul 18, 2010 FINDINGS: CT HEAD FINDINGS Brain: No evidence of acute infarction, hemorrhage, hydrocephalus, extra-axial collection or mass lesion/mass effect. Generalized atrophy and chronic microvascular ischemic changes are similar to the prior exam. Vascular: No hyperdense vessel or unexpected calcification. Skull: Normal. Negative for fracture or focal lesion. Sinuses/Orbits: No acute finding. Other: None. CT CERVICAL SPINE FINDINGS Alignment: Reversal of normal cervical lordotic  curvature at C4-5 and C5-6, associated with degenerative changes. Skull base and vertebrae: Extensive degenerative changes at the atlantooccipital joint with degenerative pannus formation about the odontoid. Canal narrowing just below the foramen magnum due to the presence of this degenerative process shows a chronic appearance. Some flattening of the contour of the thecal sac at this level. No sign of fracture. Soft tissues and spinal canal: No prevertebral fluid or swelling. No visible canal hematoma. Disc levels: Multilevel degenerative changes, at the skull base as described above and in the mid cervical spine with alignment changes likely related to degenerative changes also described above. Disc space narrowing greatest at C5-6 and C6-7 with uncovertebral degenerative changes and some canal narrowing related to osteophyte formation. Upper chest: Negative. Other: None IMPRESSION: 1. No acute intracranial abnormality. 2. Generalized atrophy and chronic microvascular ischemic changes of the brain, similar to the prior exam. 3. No evidence of acute fracture or subluxation of the cervical spine. 4. Multilevel degenerative changes of the cervical spine, at the skull base and in the mid cervical spine with alignment changes and central canal narrowing related to degenerative changes also described above. Electronically Signed   By: Donzetta Kohut M.D.   On: 10/09/2019 15:47   CT Cervical Spine Wo Contrast  Result Date: 10/09/2019 CLINICAL DATA:  Neck trauma. Un witnessed fall without neurologic deficit by report. EXAM: CT HEAD WITHOUT CONTRAST CT CERVICAL SPINE WITHOUT CONTRAST TECHNIQUE: Multidetector CT imaging of the head and cervical spine was performed following the standard protocol without intravenous contrast. Multiplanar CT image reconstructions of the cervical spine were also generated. COMPARISON:  Jul 18, 2010 FINDINGS: CT HEAD FINDINGS Brain: No evidence of acute infarction, hemorrhage, hydrocephalus,  extra-axial collection or mass lesion/mass effect. Generalized atrophy and chronic microvascular ischemic changes are similar to the prior exam. Vascular: No hyperdense vessel or unexpected calcification. Skull: Normal. Negative for fracture or focal lesion. Sinuses/Orbits:  No acute finding. Other: None. CT CERVICAL SPINE FINDINGS Alignment: Reversal of normal cervical lordotic curvature at C4-5 and C5-6, associated with degenerative changes. Skull base and vertebrae: Extensive degenerative changes at the atlantooccipital joint with degenerative pannus formation about the odontoid. Canal narrowing just below the foramen magnum due to the presence of this degenerative process shows a chronic appearance. Some flattening of the contour of the thecal sac at this level. No sign of fracture. Soft tissues and spinal canal: No prevertebral fluid or swelling. No visible canal hematoma. Disc levels: Multilevel degenerative changes, at the skull base as described above and in the mid cervical spine with alignment changes likely related to degenerative changes also described above. Disc space narrowing greatest at C5-6 and C6-7 with uncovertebral degenerative changes and some canal narrowing related to osteophyte formation. Upper chest: Negative. Other: None IMPRESSION: 1. No acute intracranial abnormality. 2. Generalized atrophy and chronic microvascular ischemic changes of the brain, similar to the prior exam. 3. No evidence of acute fracture or subluxation of the cervical spine. 4. Multilevel degenerative changes of the cervical spine, at the skull base and in the mid cervical spine with alignment changes and central canal narrowing related to degenerative changes also described above. Electronically Signed   By: Donzetta KohutGeoffrey  Wile M.D.   On: 10/09/2019 15:47   DG HIPS BILAT WITH PELVIS MIN 5 VIEWS  Result Date: 10/09/2019 CLINICAL DATA:  Unwitnessed fall EXAM: DG HIP (WITH OR WITHOUT PELVIS) 5+V BILAT COMPARISON:  07/18/2010  FINDINGS: Diffuse osseous demineralization. Bilateral hips are intact without evidence of fracture or dislocation. Joint spaces are relatively preserved. Pelvic bony ring appears intact. Lower lumbar spondylosis. Vascular calcification. Aortoiliac stent grafts partially visualized. IMPRESSION: No acute osseous abnormality of the bilateral hips. Bones are diffusely demineralized. If there is high clinical suspicion for occult fracture, an MRI could be considered. Electronically Signed   By: Duanne GuessNicholas  Plundo D.O.   On: 10/09/2019 15:32    Procedures Procedures (including critical care time)  Medications Ordered in ED Medications - No data to display  ED Course  I have reviewed the triage vital signs and the nursing notes.  Pertinent labs & imaging results that were available during my care of the patient were reviewed by me and considered in my medical decision making (see chart for details).    MDM Rules/Calculators/A&P                          Head CT CT neck without any acute findings.  X-rays of both hips and pelvis without any acute bony abnormalities.  X-ray of the left ankle shows a distal tib-fib fracture.  Closed fracture no open wounds.  Discussed with Dr. Everardo PacificVarkey from orthopedics.  He will treat with just a cast.  Since patient essentially nonambulatory.  Patient splint placed posterior splint and stirrup splint.  Good cap refill to the toes after placement of the splint.  Patient without any complaints.  Cervical collar removed.  Patient's son aware of the follow-up with orthopedics for casting.  And the plan for nonoperative healing of the fracture.  He is okay with that.  Patient will need transport back to the nursing facility.  Final Clinical Impression(s) / ED Diagnoses Final diagnoses:  Fall, initial encounter  Closed fracture of left tibia and fibula, initial encounter    Rx / DC Orders ED Discharge Orders    None       Vanetta MuldersZackowski, Effie Wahlert, MD 10/09/19 [id number]

## 2019-10-09 NOTE — Progress Notes (Signed)
Orthopedic Tech Progress Note Patient Details:  Ricardo [name] Mar 28, 1913 [id number]  Ortho Devices Type of Ortho Device: Ace wrap, Stirrup splint, Post (short leg) splint Ortho Device/Splint Location: LLE Ortho Device/Splint Interventions: Ordered, Application   Post Interventions Patient Tolerated: Well Instructions Provided: Care of device   Jennye Moccasin 10/09/2019, 3:56 PM

## 2019-10-09 NOTE — ED Triage Notes (Signed)
Pt arrived via EMS from NH, per EMS pt unwitnessed fall. No neurological deficits. Pt ao to baseline. C/o headache and left ankle pain, visible swelling.

## 2019-10-10 ENCOUNTER — Non-Acute Institutional Stay: Payer: Medicare PPO | Admitting: Internal Medicine

## 2019-10-10 ENCOUNTER — Encounter: Payer: Self-pay | Admitting: Internal Medicine

## 2019-10-10 DIAGNOSIS — R3911 Hesitancy of micturition: Secondary | ICD-10-CM | POA: Diagnosis not present

## 2019-10-10 DIAGNOSIS — S82402D Unspecified fracture of shaft of left fibula, subsequent encounter for closed fracture with routine healing: Secondary | ICD-10-CM

## 2019-10-10 DIAGNOSIS — N401 Enlarged prostate with lower urinary tract symptoms: Secondary | ICD-10-CM

## 2019-10-10 DIAGNOSIS — S82202D Unspecified fracture of shaft of left tibia, subsequent encounter for closed fracture with routine healing: Secondary | ICD-10-CM | POA: Diagnosis not present

## 2019-10-10 DIAGNOSIS — D638 Anemia in other chronic diseases classified elsewhere: Secondary | ICD-10-CM | POA: Diagnosis not present

## 2019-10-10 DIAGNOSIS — N1831 Chronic kidney disease, stage 3a: Secondary | ICD-10-CM | POA: Diagnosis not present

## 2019-10-10 NOTE — Progress Notes (Signed)
Location:    [facility] Room Number: 32 Place of Service:  ALF [phone] Provider:  Einar CrowGupta, Ura Hausen MD  Mahlon GammonGupta, Valor Turberville L, MD  Patient Care Team: Mahlon GammonGupta, Shawanda Sievert L, MD as PCP - General (Internal Medicine) Mast, Man X, NP as Nurse Practitioner (Internal Medicine)  Extended Emergency Contact Information Primary Emergency Contact: [name],Phillip          [place], [place] Home Phone: [phone] Mobile Phone: [phone] Relation: Son Secondary Emergency Contact: [name],[name]  [place] Mobile Phone: [phone] Relation: Grandson  Code Status:  DNR Goals of care: Advanced Directive information Advanced Directives 10/09/2019  Does Patient Have a Medical Advance Directive? Unable to assess, patient is non-responsive or altered mental status  Type of Advance Directive -  Does patient want to make changes to medical advance directive? -  Copy of Healthcare Power of Attorney in Chart? -  Would patient like information on creating a medical advance directive? -  Pre-existing out of facility DNR order (yellow form or pink MOST form) -     Chief Complaint  Patient presents with  . Acute Visit    ED follow up    HPI:  Pt is a [age] y.o. male seen today for an acute visit for ED follow up  Long term Resident of [place]. Patient is a resident in [facility] He has a history of AAA with measurement of 9.3 cm in CT scanin07/19,hypertension, peripheral neuropathy, BPH, hypothyroid, GERDand very hard of hearing  Patient fell in the facility when he was trying to get out of the bed In ED he was found to have distal tibia fib fracture.  X-rays of both hip and pelvis were without any acute abnormality.  CT of the neck was negative.  A soft cast was placed with posterior and stirrup splint with plan for conservative management.  And needing of casting by orthopedics as outpatient Patient also came with a Foley catheter.  I was unable  to find any notes and nursing notes for the reason. Patient unable to give me any history.  Since he has been back is been sleeping   Past Medical History:  Diagnosis Date  . AAA (abdominal aortic aneurysm) (HCC)   . Abnormality of gait 08/01/2010  . Adhesive capsulitis of shoulder 08/01/2010   "Frozen" right shoulder  . Anemia   . Anxiety state, unspecified 08/01/2010  . Arthritis   . Congestive heart failure, unspecified 08/01/2010  . Cough 08/07/2015  . Disorders of bursae and tendons in shoulder region, unspecified 08/01/2010  . Edema 08/01/2010  . Fall   . Fecal impaction (HCC)   . GERD (gastroesophageal reflux disease)   . Hiatal hernia   . Hypercalcemia   . Hyperlipidemia   . Hypertension   . Hypertrophy of prostate without urinary obstruction and other lower urinary tract symptoms (LUTS) 08/01/2010  . Hyponatremia   . Hypothyroidism   . Infectious colitis   . Insomnia, unspecified 08/01/2010  . Joint pain   . Leg pain    with walking  . Lumbago 12/24/2010  . Orthostatic hypotension 11/05/2010  . Other specified cardiac dysrhythmias(427.89) 12/24/2010  . Pain in joint, pelvic region and thigh 08/06/2010  . Peptic ulcer, unspecified site, unspecified as acute or chronic, without mention of hemorrhage, perforation, or obstruction 08/01/2010  . Renal failure, acute (HCC)   . Swelling, mass, or lump in head and neck 01/03/2011  . Ulcer    Stomach  . Unspecified  disorder of kidney and ureter 08/01/2010  . Unspecified hearing loss 08/01/2010  . Unspecified vitamin D deficiency 08/06/2010  . Urinary frequency 09/13/2010  . Vertigo    Past Surgical History:  Procedure Laterality Date  . ABDOMINAL AORTIC ANEURYSM REPAIR  12-18-10   Stent graft repair of AAA  . APPENDECTOMY    . CATARACT EXTRACTION    . HEMORRHOID SURGERY  2000  . INGUINAL HERNIA REPAIR Left 2005  . TRANSTHORACIC ECHOCARDIOGRAM  07/19/2010    Left ventricle: There is hypokinesis of the inferior wall and  posterior  wall. The EF is 35-40%. The cavity size was normal    Allergies  Allergen Reactions  . Aleve [Naproxen] Other (See Comments)    Per MAR  . Indocin [Indomethacin] Other (See Comments)    Per MAR    Allergies as of 10/10/2019      Reactions   Aleve [naproxen] Other (See Comments)   Per MAR   Indocin [indomethacin] Other (See Comments)   Per [facility]      Medication List       Accurate as of October 10, 2019  4:48 PM. If you have any questions, ask your nurse or doctor.        acetaminophen 500 MG tablet Commonly known as: TYLENOL Take 1,000 mg by mouth 2 (two) times daily.   acetaminophen 500 MG tablet Commonly known as: TYLENOL Take 500 mg by mouth every 8 (eight) hours as needed.   Aspercreme Lidocaine 4 % Ptch Generic drug: Lidocaine Apply 1 patch topically See admin instructions. 1 patch applied once a day to mid lower back   benzocaine 10 % mucosal gel Commonly known as: ORAJEL 1 application. May keep at bedside and self-administer. Apply with cotton applicator to mouth and/or throat for mouth pain as needed.   bisacodyl 10 MG suppository Commonly known as: DULCOLAX Place 10 mg rectally every other day. For constipation Hold for loose stools   Cholecalciferol 25 MCG (1000 UT) tablet Take 2,000 Units by mouth daily.   DEXTRAN 70-HYPROMELLOSE (PF) OP Place 1 drop into both eyes as needed (for dry eyes). wait 3-5 minutes between eye drops   finasteride 5 MG tablet Commonly known as: PROSCAR Take 5 mg by mouth daily. Women who are pregnant or may become pregnant should use precautions when handling this medication.   fluocinonide 0.05 % external solution Commonly known as: LIDEX Apply 1 application topically as needed. apply to scalp for itching   fluticasone 50 MCG/ACT nasal spray Commonly known as: FLONASE Place 2 sprays into both nostrils at bedtime.   gabapentin 100 MG capsule Commonly known as: NEURONTIN Take 100 mg by mouth daily.   hydrocortisone 1 %  lotion Apply 1 application topically 2 (two) times daily as needed for itching.   ketoconazole 2 % shampoo Commonly known as: NIZORAL Apply 1 application topically once a week. On Tues.   levothyroxine 50 MCG tablet Commonly known as: SYNTHROID Take 50 mcg by mouth daily before breakfast.   magic mouthwash w/lidocaine Soln Take 5-10 mLs by mouth every 4 (four) hours as needed for mouth pain.   magnesium citrate Soln Take by mouth as needed for severe constipation. Per ED physician resident may take 10 oz by mouth followed by 8 oz of water as needed every 4th day if no bowel movement in 3 days   Mintox 200-200-20 MG/5ML suspension Generic drug: alum & mag hydroxide-simeth Take 30 mLs by mouth every 4 (four) hours as needed for indigestion  or heartburn. PRN indigestion/stomach upset x 48 hours. Notify MD if symptoms continue. DO NOT USE FOR CHRONIC KIDNEY DISEASE STAGE FOUR OR HEMODIALYSIS PATIENTS.   OcuSoft Eyelid Cleansing Pads Apply topically. 1 pad to each eye once a morning   omeprazole 20 MG tablet Commonly known as: PRILOSEC OTC Take 20 mg by mouth daily.   oxyCODONE 10 mg 12 hr tablet Commonly known as: OxyCONTIN Take 1 tablet (10 mg total) by mouth daily. At 6am. Do Not Crush   polyethylene glycol 17 g packet Commonly known as: MIRALAX / GLYCOLAX Take 17 g by mouth 2 (two) times daily. Mix in 4-8 ounces of fluids for constipation,Hold for loose stools   sennosides-docusate sodium 8.6-50 MG tablet Commonly known as: SENOKOT-S Take 2 tablets by mouth at bedtime.   SleepRight Breathe Aid Misc 1 strip at bedtime.   sodium chloride 0.65 % Soln nasal spray Commonly known as: OCEAN Place 1 spray into both nostrils 3 (three) times daily as needed for congestion.   triamcinolone 0.1 % paste Commonly known as: KENALOG Use as directed 1 application in the mouth or throat every 4 (four) hours as needed.   TUSSIN COUGH DM PO Take 5 mLs by mouth every 4 (four) hours as  needed.       Review of Systems  Unable to perform ROS: Other    Immunization History  Administered Date(s) Administered  . Influenza, High Dose Seasonal PF 12/21/2018  . Influenza-Unspecified 12/27/2010, 12/29/2011, 12/27/2012, 12/13/2013, 12/14/2014, 12/20/2015, 12/17/2016, 12/14/2017  . Moderna SARS-COVID-2 Vaccination 03/14/2019, 04/11/2019  . PPD Test 07/30/2010, 08/14/2010  . Pneumococcal Conjugate-13 11/04/2016  . Pneumococcal Polysaccharide-23 03/10/2005  . Td 03/11/1999  . Tdap 10/31/2016  . Zoster Recombinat (Shingrix) 11/12/2018   Pertinent  Health Maintenance Due  Topic Date Due  . INFLUENZA VACCINE  10/09/2019  . PNA vac Low Risk Adult  Completed   Fall Risk  11/04/2017 10/29/2016 08/07/2015 08/07/2015 04/10/2015  Falls in the past year? No Yes Yes No No  Comment - - 06/02/15- skin tear right arm - -  Number falls in past yr: - 1 - - -  Injury with Fall? - No - - -   Functional Status Survey:    Vitals:   10/10/19 1637  BP: 120/76  Pulse: 62  Resp: 16  Temp: (!) 97.5 F (36.4 C)  SpO2: 96%  Weight: 128 lb 3.2 oz (58.2 kg)  Height: 5\' 9"  (1.753 m)   Body mass index is 18.93 kg/m. Physical Exam Vitals reviewed.  Constitutional:      Appearance: Normal appearance.  HENT:     Head: Normocephalic.     Nose: Nose normal.     Mouth/Throat:     Mouth: Mucous membranes are moist.     Pharynx: Oropharynx is clear.  Eyes:     Pupils: Pupils are equal, round, and reactive to light.  Cardiovascular:     Rate and Rhythm: Normal rate and regular rhythm.  Pulmonary:     Effort: Pulmonary effort is normal.     Breath sounds: Normal breath sounds.  Abdominal:     General: Abdomen is flat. Bowel sounds are normal.     Palpations: Abdomen is soft.  Musculoskeletal:     Cervical back: Neck supple.     Comments: Left Ankle in the Cast  Skin:    General: Skin is warm.  Neurological:     General: No focal deficit present.  Psychiatric:        Mood  and  Affect: Mood normal.     Labs reviewed: Recent Labs    04/28/19 0000 09/08/19 0000 09/13/19 0000  NA 138 133* 135*  K 4.2 5.7* 4.5  CL 108 102 103  CO2 22 27* 23*  BUN 31* 36* 33*  CREATININE 1.0 1.1 1.2  CALCIUM 8.5* 8.6* 8.4*   Recent Labs    11/03/18 0000 04/28/19 0000 09/08/19 0000  AST 14 13* 14  ALT 6* 6* 4*  ALKPHOS 132* 136* 108  PROT 6.2*  --   --   ALBUMIN 3.7 3.4* 3.6   Recent Labs    11/03/18 0000 04/28/19 0000 09/08/19 0000  WBC 7.2 6.2 7.5  NEUTROABS 4,550 4,104 4,080  HGB 9.6* 9.7* 8.9*  HCT 30* 30* 28*  PLT 203 209 198   Lab Results  Component Value Date   TSH 1.89 04/28/2019   Lab Results  Component Value Date   HGBA1C 5.6 04/06/2017   Lab Results  Component Value Date   CHOL 145 04/06/2017   HDL 47 04/06/2017   LDLCALC 89 04/06/2017   TRIG 47 04/06/2017   CHOLHDL 3.1 04/06/2017    Significant Diagnostic Results in last 30 days:  DG Ankle Complete Left  Result Date: 10/09/2019 CLINICAL DATA:  Unwitnessed fall.  Left ankle deformity EXAM: LEFT ANKLE COMPLETE - 3+ VIEW COMPARISON:  None. FINDINGS: Acute comminuted fractures of the distal left tibial and fibular metadiaphyses. Both fractures are minimally posteriorly displaced with mild valgus and anterior apex angulation. Both fractures appear to be extra-articular. The ankle mortise is congruent. Bones are diffusely demineralized. Relatively mild soft tissue swelling. Advanced vascular calcifications. IMPRESSION: Acute comminuted fractures of the distal left tibial and fibular metadiaphyses. Both fractures are minimally posteriorly displaced and mildly angulated. Electronically Signed   By: Duanne Guess D.O.   On: 10/09/2019 15:34   CT Head Wo Contrast  Result Date: 10/09/2019 CLINICAL DATA:  Neck trauma. Un witnessed fall without neurologic deficit by report. EXAM: CT HEAD WITHOUT CONTRAST CT CERVICAL SPINE WITHOUT CONTRAST TECHNIQUE: Multidetector CT imaging of the head and cervical  spine was performed following the standard protocol without intravenous contrast. Multiplanar CT image reconstructions of the cervical spine were also generated. COMPARISON:  Jul 18, 2010 FINDINGS: CT HEAD FINDINGS Brain: No evidence of acute infarction, hemorrhage, hydrocephalus, extra-axial collection or mass lesion/mass effect. Generalized atrophy and chronic microvascular ischemic changes are similar to the prior exam. Vascular: No hyperdense vessel or unexpected calcification. Skull: Normal. Negative for fracture or focal lesion. Sinuses/Orbits: No acute finding. Other: None. CT CERVICAL SPINE FINDINGS Alignment: Reversal of normal cervical lordotic curvature at C4-5 and C5-6, associated with degenerative changes. Skull base and vertebrae: Extensive degenerative changes at the atlantooccipital joint with degenerative pannus formation about the odontoid. Canal narrowing just below the foramen magnum due to the presence of this degenerative process shows a chronic appearance. Some flattening of the contour of the thecal sac at this level. No sign of fracture. Soft tissues and spinal canal: No prevertebral fluid or swelling. No visible canal hematoma. Disc levels: Multilevel degenerative changes, at the skull base as described above and in the mid cervical spine with alignment changes likely related to degenerative changes also described above. Disc space narrowing greatest at C5-6 and C6-7 with uncovertebral degenerative changes and some canal narrowing related to osteophyte formation. Upper chest: Negative. Other: None IMPRESSION: 1. No acute intracranial abnormality. 2. Generalized atrophy and chronic microvascular ischemic changes of the brain, similar to the prior exam.  3. No evidence of acute fracture or subluxation of the cervical spine. 4. Multilevel degenerative changes of the cervical spine, at the skull base and in the mid cervical spine with alignment changes and central canal narrowing related to  degenerative changes also described above. Electronically Signed   By: Donzetta Kohut M.D.   On: 10/09/2019 15:47   CT Cervical Spine Wo Contrast  Result Date: 10/09/2019 CLINICAL DATA:  Neck trauma. Un witnessed fall without neurologic deficit by report. EXAM: CT HEAD WITHOUT CONTRAST CT CERVICAL SPINE WITHOUT CONTRAST TECHNIQUE: Multidetector CT imaging of the head and cervical spine was performed following the standard protocol without intravenous contrast. Multiplanar CT image reconstructions of the cervical spine were also generated. COMPARISON:  Jul 18, 2010 FINDINGS: CT HEAD FINDINGS Brain: No evidence of acute infarction, hemorrhage, hydrocephalus, extra-axial collection or mass lesion/mass effect. Generalized atrophy and chronic microvascular ischemic changes are similar to the prior exam. Vascular: No hyperdense vessel or unexpected calcification. Skull: Normal. Negative for fracture or focal lesion. Sinuses/Orbits: No acute finding. Other: None. CT CERVICAL SPINE FINDINGS Alignment: Reversal of normal cervical lordotic curvature at C4-5 and C5-6, associated with degenerative changes. Skull base and vertebrae: Extensive degenerative changes at the atlantooccipital joint with degenerative pannus formation about the odontoid. Canal narrowing just below the foramen magnum due to the presence of this degenerative process shows a chronic appearance. Some flattening of the contour of the thecal sac at this level. No sign of fracture. Soft tissues and spinal canal: No prevertebral fluid or swelling. No visible canal hematoma. Disc levels: Multilevel degenerative changes, at the skull base as described above and in the mid cervical spine with alignment changes likely related to degenerative changes also described above. Disc space narrowing greatest at C5-6 and C6-7 with uncovertebral degenerative changes and some canal narrowing related to osteophyte formation. Upper chest: Negative. Other: None IMPRESSION: 1.  No acute intracranial abnormality. 2. Generalized atrophy and chronic microvascular ischemic changes of the brain, similar to the prior exam. 3. No evidence of acute fracture or subluxation of the cervical spine. 4. Multilevel degenerative changes of the cervical spine, at the skull base and in the mid cervical spine with alignment changes and central canal narrowing related to degenerative changes also described above. Electronically Signed   By: Donzetta Kohut M.D.   On: 10/09/2019 15:47   DG HIPS BILAT WITH PELVIS MIN 5 VIEWS  Result Date: 10/09/2019 CLINICAL DATA:  Unwitnessed fall EXAM: DG HIP (WITH OR WITHOUT PELVIS) 5+V BILAT COMPARISON:  07/18/2010 FINDINGS: Diffuse osseous demineralization. Bilateral hips are intact without evidence of fracture or dislocation. Joint spaces are relatively preserved. Pelvic bony ring appears intact. Lower lumbar spondylosis. Vascular calcification. Aortoiliac stent grafts partially visualized. IMPRESSION: No acute osseous abnormality of the bilateral hips. Bones are diffusely demineralized. If there is high clinical suspicion for occult fracture, an MRI could be considered. Electronically Signed   By: Duanne Guess D.O.   On: 10/09/2019 15:32    Assessment/Plan  Closed fracture of left tibia and fibula with routine healing, subsequent encounter Pain Seems Controlled right now Had soft cast and splinter Has appointment with orthopedics for hard cast. Patient would need help and possibly would need higher level of care.  Benign prostatic hyperplasia with urinary hesitancy Foley catheter was placed in ED  Will removed the catheter and monitor his urine output  anemia of chronic disease Last hemoglobin was 8.9 Repeat CBC and start him on iron  Stage 3a chronic kidney disease BUN and  creatinine is stable Abdominal aortic aneurysm (AAA) without rupture (HCC) Last Measurment was 9.2 cm No Issues. Non Surgical Candidate Essential hypertension Off all  his Meds  Neuropathic pain Stable on Neurontin Hypothyroidism, unspecified type TSH Normal in 2/21  Allergic rhinitis, unspecified seasonality, unspecified trigger On Flonase    Family/ staff Communication:   Labs/tests ordered:

## 2019-10-13 ENCOUNTER — Encounter: Payer: Self-pay | Admitting: Internal Medicine

## 2019-10-13 ENCOUNTER — Non-Acute Institutional Stay: Payer: Medicare PPO | Admitting: Internal Medicine

## 2019-10-13 DIAGNOSIS — N401 Enlarged prostate with lower urinary tract symptoms: Secondary | ICD-10-CM | POA: Diagnosis not present

## 2019-10-13 DIAGNOSIS — S82202D Unspecified fracture of shaft of left tibia, subsequent encounter for closed fracture with routine healing: Secondary | ICD-10-CM

## 2019-10-13 DIAGNOSIS — D638 Anemia in other chronic diseases classified elsewhere: Secondary | ICD-10-CM | POA: Diagnosis not present

## 2019-10-13 DIAGNOSIS — R3911 Hesitancy of micturition: Secondary | ICD-10-CM

## 2019-10-13 DIAGNOSIS — N1831 Chronic kidney disease, stage 3a: Secondary | ICD-10-CM | POA: Diagnosis not present

## 2019-10-13 DIAGNOSIS — S82402D Unspecified fracture of shaft of left fibula, subsequent encounter for closed fracture with routine healing: Secondary | ICD-10-CM

## 2019-10-13 DIAGNOSIS — D649 Anemia, unspecified: Secondary | ICD-10-CM | POA: Diagnosis not present

## 2019-10-13 LAB — CBC AND DIFFERENTIAL
HCT: 30 — AB (ref 41–53)
Hemoglobin: 9.5 — AB (ref 13.5–17.5)
Neutrophils Absolute: 5491
Platelets: 203 (ref 150–399)
WBC: 7.9

## 2019-10-13 LAB — CBC: RBC: 3.26 — AB (ref 3.87–5.11)

## 2019-10-13 NOTE — Progress Notes (Signed)
Location:  [facility] Room Number: 32 Place of Service:  ALF [phone] Provider:  Mahlon GammonGupta, Carlester Kasparek L, MD  Patient Care Team: Mahlon GammonGupta, Roberts Bon L, MD as PCP - General (Internal Medicine) Mast, Man X, NP as Nurse Practitioner (Internal Medicine)  Extended Emergency Contact Information Primary Emergency Contact: [name],Phillip          [place] Home Phone: [phone] Mobile Phone: [phone] Relation: Son Secondary Emergency Contact: [name],[name]  [place] Mobile Phone: [phone] Relation: Grandson  Code Status:  DNR Goals of care: Advanced Directive information Advanced Directives 10/13/2019  Does Patient Have a Medical Advance Directive? Yes  Type of Estate agentAdvance Directive Healthcare Power of WoodmanAttorney;Living will;Out of facility DNR (pink MOST or yellow form)  Does patient want to make changes to medical advance directive? No - Patient declined  Copy of Healthcare Power of Attorney in Chart? Yes - validated most recent copy scanned in chart (See row information)  Would patient like information on creating a medical advance directive? -  Pre-existing out of facility DNR order (yellow form or pink MOST form) -     Chief Complaint  Patient presents with  . Acute Visit    Patient is seen for followup and family discussion.     HPI:  Pt is a [age] y.o. male seen today for an acute visit for D/W Family about his status and further care Long term Resident of AL. Patient is a resident in [facility] He has a history of AAA with measurement of 9.3 cm in CT scanin07/19,hypertension, peripheral neuropathy, BPH, hypothyroid, GERDand very hard of hearing Recent Fall with distal tibia fib fracture Plan for Conservative Management with NWB and Hard Cast Patient unable to give much history is very hard of hearing.  He thinks little bit more confused today.  But denies any pain or discomfort .  His son and daughter-in-law  are in the room.  Nursing facility is not comfortable with patient staying in assisted living.  They wanted me to talk to the family to move him to higher level of care. Patient's daughter-in-law is adamant.  They have hired caregivers and they think that he would be more safer and comfortable in his own room    Past Medical History:  Diagnosis Date  . AAA (abdominal aortic aneurysm) (HCC)   . Abnormality of gait 08/01/2010  . Adhesive capsulitis of shoulder 08/01/2010   "Frozen" right shoulder  . Anemia   . Anxiety state, unspecified 08/01/2010  . Arthritis   . Congestive heart failure, unspecified 08/01/2010  . Cough 08/07/2015  . Disorders of bursae and tendons in shoulder region, unspecified 08/01/2010  . Edema 08/01/2010  . Fall   . Fecal impaction (HCC)   . GERD (gastroesophageal reflux disease)   . Hiatal hernia   . Hypercalcemia   . Hyperlipidemia   . Hypertension   . Hypertrophy of prostate without urinary obstruction and other lower urinary tract symptoms (LUTS) 08/01/2010  . Hyponatremia   . Hypothyroidism   . Infectious colitis   . Insomnia, unspecified 08/01/2010  . Joint pain   . Leg pain    with walking  . Lumbago 12/24/2010  . Orthostatic hypotension 11/05/2010  . Other specified cardiac dysrhythmias(427.89) 12/24/2010  . Pain in joint, pelvic region and thigh 08/06/2010  . Peptic ulcer, unspecified site, unspecified as acute or chronic, without mention of hemorrhage, perforation, or obstruction 08/01/2010  . Renal failure, acute (HCC)   .  Swelling, mass, or lump in head and neck 01/03/2011  . Ulcer    Stomach  . Unspecified disorder of kidney and ureter 08/01/2010  . Unspecified hearing loss 08/01/2010  . Unspecified vitamin D deficiency 08/06/2010  . Urinary frequency 09/13/2010  . Vertigo    Past Surgical History:  Procedure Laterality Date  . ABDOMINAL AORTIC ANEURYSM REPAIR  12-18-10   Stent graft repair of AAA  . APPENDECTOMY    . CATARACT EXTRACTION    .  HEMORRHOID SURGERY  2000  . INGUINAL HERNIA REPAIR Left 2005  . TRANSTHORACIC ECHOCARDIOGRAM  07/19/2010    Left ventricle: There is hypokinesis of the inferior wall and  posterior wall. The EF is 35-40%. The cavity size was normal    Allergies  Allergen Reactions  . Aleve [Naproxen] Other (See Comments)    Per MAR  . Indocin [Indomethacin] Other (See Comments)    Per [facility]    Outpatient Encounter Medications as of 10/13/2019  Medication Sig  . acetaminophen (TYLENOL) 500 MG tablet Take 1,000 mg by mouth 2 (two) times daily.   Marland Kitchen acetaminophen (TYLENOL) 500 MG tablet Take 500 mg by mouth every 8 (eight) hours as needed.   . bisacodyl (DULCOLAX) 10 MG suppository Place 10 mg rectally every other day. For constipation Hold for loose stools  . Cholecalciferol 25 MCG (1000 UT) tablet Take 2,000 Units by mouth daily.   . Eyelid Cleansers (OCUSOFT EYELID CLEANSING) PADS Apply topically. 1 pad to each eye once a morning  . ferrous sulfate 325 (65 FE) MG tablet Take 325 mg by mouth See admin instructions. QAM Monday and Friday  . finasteride (PROSCAR) 5 MG tablet Take 5 mg by mouth daily. Women who are pregnant or may become pregnant should use precautions when handling this medication.  . fluticasone (FLONASE) 50 MCG/ACT nasal spray Place 2 sprays into both nostrils at bedtime.   . gabapentin (NEURONTIN) 100 MG capsule Take 100 mg by mouth daily.  Marland Kitchen levothyroxine (SYNTHROID) 50 MCG tablet Take 50 mcg by mouth daily before breakfast.  . Lidocaine (ASPERCREME LIDOCAINE) 4 % PTCH Apply 1 patch topically in the morning. Apply to mid lower back  . Misc. Devices (SLEEPRIGHT BREATHE AID) MISC 1 strip at bedtime.  Marland Kitchen omeprazole (PRILOSEC OTC) 20 MG tablet Take 20 mg by mouth daily.  Marland Kitchen oxyCODONE (OXYCONTIN) 10 mg 12 hr tablet Take 1 tablet (10 mg total) by mouth daily. At 6am. Do Not Crush  . polyethylene glycol (MIRALAX / GLYCOLAX) packet Take 17 g by mouth 2 (two) times daily. Mix in 4-8 ounces of fluids  for constipation,Hold for loose stools  . sennosides-docusate sodium (SENOKOT-S) 8.6-50 MG tablet Take 2 tablets by mouth at bedtime.  . [DISCONTINUED] alum & mag hydroxide-simeth (MINTOX) 200-200-20 MG/5ML suspension Take 30 mLs by mouth every 4 (four) hours as needed for indigestion or heartburn. PRN indigestion/stomach upset x 48 hours. Notify MD if symptoms continue. DO NOT USE FOR CHRONIC KIDNEY DISEASE STAGE FOUR OR HEMODIALYSIS PATIENTS.  . [DISCONTINUED] benzocaine (ORAJEL) 10 % mucosal gel 1 application. May keep at bedside and self-administer. Apply with cotton applicator to mouth and/or throat for mouth pain as needed.  . [DISCONTINUED] DEXTRAN 70-HYPROMELLOSE, PF, OP Place 1 drop into both eyes as needed (for dry eyes). wait 3-5 minutes between eye drops  . [DISCONTINUED] Dextromethorphan-Guaifenesin (TUSSIN COUGH DM PO) Take 5 mLs by mouth every 4 (four) hours as needed.  . [DISCONTINUED] fluocinonide (LIDEX) 0.05 % external solution Apply 1 application  topically as needed. apply to scalp for itching  . [DISCONTINUED] hydrocortisone 1 % lotion Apply 1 application topically 2 (two) times daily as needed for itching.   . [DISCONTINUED] ketoconazole (NIZORAL) 2 % shampoo Apply 1 application topically once a week. On Tues.  . [DISCONTINUED] magic mouthwash w/lidocaine SOLN Take 5-10 mLs by mouth every 4 (four) hours as needed for mouth pain.  . [DISCONTINUED] magnesium citrate SOLN Take by mouth as needed for severe constipation. Per ED physician resident may take 10 oz by mouth followed by 8 oz of water as needed every 4th day if no bowel movement in 3 days  . [DISCONTINUED] sodium chloride (OCEAN) 0.65 % SOLN nasal spray Place 1 spray into both nostrils 3 (three) times daily as needed for congestion.  . [DISCONTINUED] triamcinolone (KENALOG) 0.1 % paste Use as directed 1 application in the mouth or throat every 4 (four) hours as needed.   No facility-administered encounter medications on  file as of 10/13/2019.    Review of Systems  Unable to perform ROS: Other    Immunization History  Administered Date(s) Administered  . Influenza, High Dose Seasonal PF 12/21/2018  . Influenza-Unspecified 12/27/2010, 12/29/2011, 12/27/2012, 12/13/2013, 12/14/2014, 12/20/2015, 12/17/2016, 12/14/2017  . Moderna SARS-COVID-2 Vaccination 03/14/2019, 04/11/2019  . PPD Test 07/30/2010, 08/14/2010  . Pneumococcal Conjugate-13 11/04/2016  . Pneumococcal Polysaccharide-23 03/10/2005  . Td 03/11/1999  . Tdap 10/31/2016  . Zoster Recombinat (Shingrix) 11/12/2018   Pertinent  Health Maintenance Due  Topic Date Due  . INFLUENZA VACCINE  10/09/2019  . PNA vac Low Risk Adult  Completed   Fall Risk  11/04/2017 10/29/2016 08/07/2015 08/07/2015 04/10/2015  Falls in the past year? No Yes Yes No No  Comment - - 06/02/15- skin tear right arm - -  Number falls in past yr: - 1 - - -  Injury with Fall? - No - - -   Functional Status Survey:    Vitals:   10/13/19 1525  BP: 120/86  Pulse: 70  Resp: 20  Temp: (!) 97.5 F (36.4 C)  TempSrc: Oral  SpO2: 95%  Weight: 128 lb 3.2 oz (58.2 kg)  Height: 5\' 9"  (1.753 m)   Body mass index is 18.93 kg/m. Physical Exam Vitals reviewed.  Constitutional:      Appearance: Normal appearance.  HENT:     Head: Normocephalic.     Nose: Nose normal.     Mouth/Throat:     Mouth: Mucous membranes are moist.     Pharynx: Oropharynx is clear.  Eyes:     Pupils: Pupils are equal, round, and reactive to light.  Cardiovascular:     Rate and Rhythm: Normal rate and regular rhythm.     Pulses: Normal pulses.  Pulmonary:     Effort: Pulmonary effort is normal.     Breath sounds: Normal breath sounds.  Abdominal:     General: Abdomen is flat. Bowel sounds are normal.     Palpations: Abdomen is soft.  Musculoskeletal:     Cervical back: Neck supple.     Comments: Left leg in Soft Cast. Warm Toes No Edema  Skin:    General: Skin is warm.  Neurological:      Mental Status: He is alert.     Comments: Alert but seems more confused. Denies any Pain or Discomfort  Psychiatric:        Mood and Affect: Mood normal.        Thought Content: Thought content normal.  Labs reviewed: Recent Labs    04/28/19 0000 09/08/19 0000 09/13/19 0000  NA 138 133* 135*  K 4.2 5.7* 4.5  CL 108 102 103  CO2 22 27* 23*  BUN 31* 36* 33*  CREATININE 1.0 1.1 1.2  CALCIUM 8.5* 8.6* 8.4*   Recent Labs    11/03/18 0000 04/28/19 0000 09/08/19 0000  AST 14 13* 14  ALT 6* 6* 4*  ALKPHOS 132* 136* 108  PROT 6.2*  --   --   ALBUMIN 3.7 3.4* 3.6   Recent Labs    11/03/18 0000 04/28/19 0000 09/08/19 0000  WBC 7.2 6.2 7.5  NEUTROABS 4,550 4,104 4,080  HGB 9.6* 9.7* 8.9*  HCT 30* 30* 28*  PLT 203 209 198   Lab Results  Component Value Date   TSH 1.89 04/28/2019   Lab Results  Component Value Date   HGBA1C 5.6 04/06/2017   Lab Results  Component Value Date   CHOL 145 04/06/2017   HDL 47 04/06/2017   LDLCALC 89 04/06/2017   TRIG 47 04/06/2017   CHOLHDL 3.1 04/06/2017    Significant Diagnostic Results in last 30 days:  DG Ankle Complete Left  Result Date: 10/09/2019 CLINICAL DATA:  Unwitnessed fall.  Left ankle deformity EXAM: LEFT ANKLE COMPLETE - 3+ VIEW COMPARISON:  None. FINDINGS: Acute comminuted fractures of the distal left tibial and fibular metadiaphyses. Both fractures are minimally posteriorly displaced with mild valgus and anterior apex angulation. Both fractures appear to be extra-articular. The ankle mortise is congruent. Bones are diffusely demineralized. Relatively mild soft tissue swelling. Advanced vascular calcifications. IMPRESSION: Acute comminuted fractures of the distal left tibial and fibular metadiaphyses. Both fractures are minimally posteriorly displaced and mildly angulated. Electronically Signed   By: Duanne Guess D.O.   On: 10/09/2019 15:34   CT Head Wo Contrast  Result Date: 10/09/2019 CLINICAL DATA:  Neck  trauma. Un witnessed fall without neurologic deficit by report. EXAM: CT HEAD WITHOUT CONTRAST CT CERVICAL SPINE WITHOUT CONTRAST TECHNIQUE: Multidetector CT imaging of the head and cervical spine was performed following the standard protocol without intravenous contrast. Multiplanar CT image reconstructions of the cervical spine were also generated. COMPARISON:  Jul 18, 2010 FINDINGS: CT HEAD FINDINGS Brain: No evidence of acute infarction, hemorrhage, hydrocephalus, extra-axial collection or mass lesion/mass effect. Generalized atrophy and chronic microvascular ischemic changes are similar to the prior exam. Vascular: No hyperdense vessel or unexpected calcification. Skull: Normal. Negative for fracture or focal lesion. Sinuses/Orbits: No acute finding. Other: None. CT CERVICAL SPINE FINDINGS Alignment: Reversal of normal cervical lordotic curvature at C4-5 and C5-6, associated with degenerative changes. Skull base and vertebrae: Extensive degenerative changes at the atlantooccipital joint with degenerative pannus formation about the odontoid. Canal narrowing just below the foramen magnum due to the presence of this degenerative process shows a chronic appearance. Some flattening of the contour of the thecal sac at this level. No sign of fracture. Soft tissues and spinal canal: No prevertebral fluid or swelling. No visible canal hematoma. Disc levels: Multilevel degenerative changes, at the skull base as described above and in the mid cervical spine with alignment changes likely related to degenerative changes also described above. Disc space narrowing greatest at C5-6 and C6-7 with uncovertebral degenerative changes and some canal narrowing related to osteophyte formation. Upper chest: Negative. Other: None IMPRESSION: 1. No acute intracranial abnormality. 2. Generalized atrophy and chronic microvascular ischemic changes of the brain, similar to the prior exam. 3. No evidence of acute fracture or subluxation of  the cervical spine. 4. Multilevel degenerative changes of the cervical spine, at the skull base and in the mid cervical spine with alignment changes and central canal narrowing related to degenerative changes also described above. Electronically Signed   By: Donzetta Kohut M.D.   On: 10/09/2019 15:47   CT Cervical Spine Wo Contrast  Result Date: 10/09/2019 CLINICAL DATA:  Neck trauma. Un witnessed fall without neurologic deficit by report. EXAM: CT HEAD WITHOUT CONTRAST CT CERVICAL SPINE WITHOUT CONTRAST TECHNIQUE: Multidetector CT imaging of the head and cervical spine was performed following the standard protocol without intravenous contrast. Multiplanar CT image reconstructions of the cervical spine were also generated. COMPARISON:  Jul 18, 2010 FINDINGS: CT HEAD FINDINGS Brain: No evidence of acute infarction, hemorrhage, hydrocephalus, extra-axial collection or mass lesion/mass effect. Generalized atrophy and chronic microvascular ischemic changes are similar to the prior exam. Vascular: No hyperdense vessel or unexpected calcification. Skull: Normal. Negative for fracture or focal lesion. Sinuses/Orbits: No acute finding. Other: None. CT CERVICAL SPINE FINDINGS Alignment: Reversal of normal cervical lordotic curvature at C4-5 and C5-6, associated with degenerative changes. Skull base and vertebrae: Extensive degenerative changes at the atlantooccipital joint with degenerative pannus formation about the odontoid. Canal narrowing just below the foramen magnum due to the presence of this degenerative process shows a chronic appearance. Some flattening of the contour of the thecal sac at this level. No sign of fracture. Soft tissues and spinal canal: No prevertebral fluid or swelling. No visible canal hematoma. Disc levels: Multilevel degenerative changes, at the skull base as described above and in the mid cervical spine with alignment changes likely related to degenerative changes also described above. Disc  space narrowing greatest at C5-6 and C6-7 with uncovertebral degenerative changes and some canal narrowing related to osteophyte formation. Upper chest: Negative. Other: None IMPRESSION: 1. No acute intracranial abnormality. 2. Generalized atrophy and chronic microvascular ischemic changes of the brain, similar to the prior exam. 3. No evidence of acute fracture or subluxation of the cervical spine. 4. Multilevel degenerative changes of the cervical spine, at the skull base and in the mid cervical spine with alignment changes and central canal narrowing related to degenerative changes also described above. Electronically Signed   By: Donzetta Kohut M.D.   On: 10/09/2019 15:47   DG HIPS BILAT WITH PELVIS MIN 5 VIEWS  Result Date: 10/09/2019 CLINICAL DATA:  Unwitnessed fall EXAM: DG HIP (WITH OR WITHOUT PELVIS) 5+V BILAT COMPARISON:  07/18/2010 FINDINGS: Diffuse osseous demineralization. Bilateral hips are intact without evidence of fracture or dislocation. Joint spaces are relatively preserved. Pelvic bony ring appears intact. Lower lumbar spondylosis. Vascular calcification. Aortoiliac stent grafts partially visualized. IMPRESSION: No acute osseous abnormality of the bilateral hips. Bones are diffusely demineralized. If there is high clinical suspicion for occult fracture, an MRI could be considered. Electronically Signed   By: Duanne Guess D.O.   On: 10/09/2019 15:32    Assessment/Plan Closed fracture of left tibia and fibula with routine healing, subsequent encounter Discussed in detail with the family and nursing in charge At this time we would recommend for patient to be moved to SNF for higher level of care Patient's daughter-in-law is adamant and thinks that he should stay here in his room They are hiring sitters they are not skilled and that puts patient at high risk of falling especially once he gets the hard cast tomorrow with Ortho. All the risk and benefits were discussed with the family  and nursing charge. Patient will stay nonweightbearing  Anemia of chronic disease Repeat hemoglobin is 9.5 Low-dose of iron  Stage 3a chronic kidney disease BUN and creatinine are stable at this time Benign prostatic hyperplasia with urinary hesitancy Continue Proscar    Family/ staff Communication:   Labs/tests ordered:

## 2019-10-14 ENCOUNTER — Non-Acute Institutional Stay: Payer: Medicare PPO | Admitting: Nurse Practitioner

## 2019-10-14 ENCOUNTER — Encounter: Payer: Self-pay | Admitting: Nurse Practitioner

## 2019-10-14 DIAGNOSIS — M8949 Other hypertrophic osteoarthropathy, multiple sites: Secondary | ICD-10-CM

## 2019-10-14 DIAGNOSIS — M25572 Pain in left ankle and joints of left foot: Secondary | ICD-10-CM | POA: Diagnosis not present

## 2019-10-14 DIAGNOSIS — S82202D Unspecified fracture of shaft of left tibia, subsequent encounter for closed fracture with routine healing: Secondary | ICD-10-CM | POA: Diagnosis not present

## 2019-10-14 DIAGNOSIS — K219 Gastro-esophageal reflux disease without esophagitis: Secondary | ICD-10-CM

## 2019-10-14 DIAGNOSIS — K5901 Slow transit constipation: Secondary | ICD-10-CM

## 2019-10-14 DIAGNOSIS — N401 Enlarged prostate with lower urinary tract symptoms: Secondary | ICD-10-CM | POA: Diagnosis not present

## 2019-10-14 DIAGNOSIS — S82402A Unspecified fracture of shaft of left fibula, initial encounter for closed fracture: Secondary | ICD-10-CM | POA: Insufficient documentation

## 2019-10-14 DIAGNOSIS — S82202A Unspecified fracture of shaft of left tibia, initial encounter for closed fracture: Secondary | ICD-10-CM | POA: Insufficient documentation

## 2019-10-14 DIAGNOSIS — M150 Primary generalized (osteo)arthritis: Secondary | ICD-10-CM

## 2019-10-14 DIAGNOSIS — J309 Allergic rhinitis, unspecified: Secondary | ICD-10-CM

## 2019-10-14 DIAGNOSIS — D509 Iron deficiency anemia, unspecified: Secondary | ICD-10-CM

## 2019-10-14 DIAGNOSIS — S82402D Unspecified fracture of shaft of left fibula, subsequent encounter for closed fracture with routine healing: Secondary | ICD-10-CM

## 2019-10-14 DIAGNOSIS — E039 Hypothyroidism, unspecified: Secondary | ICD-10-CM | POA: Diagnosis not present

## 2019-10-14 DIAGNOSIS — R3911 Hesitancy of micturition: Secondary | ICD-10-CM

## 2019-10-14 DIAGNOSIS — M159 Polyosteoarthritis, unspecified: Secondary | ICD-10-CM

## 2019-10-14 NOTE — Progress Notes (Signed)
Location:   [facility] Room Number: 32 Place of Service:  ALF [phone]) Provider:  Chipper Oman, NP  [name], MD  Patient Care Team: [name], MD as PCP - General (Internal Medicine) Krystalle Pilkington X, NP as Nurse Practitioner (Internal Medicine)  Extended Emergency Contact Information Primary Emergency Contact: [name], [place] Home Phone: [phone] Mobile Phone: [phone] Relation: Son Secondary Emergency Contact: [name] [place] Mobile Phone: [phone] Relation: Grandson  Code Status:  DNR Goals of care: Advanced Directive information Advanced Directives 10/14/2019  Does Patient Have a Medical Advance Directive? Yes  Type of Estate agent of [place];Living will;Out of facility DNR (pink MOST or yellow form)  Does patient want to make changes to medical advance directive? No - Patient declined  Copy of Healthcare Power of Attorney in Chart? Yes - validated most recent copy scanned in chart (See row information)  Would patient like information on creating a medical advance directive? -  Pre-existing out of facility DNR order (yellow form or pink MOST form) Yellow form placed in chart (order not valid for inpatient use);Pink MOST form placed in chart (order not valid for inpatient use)     Chief Complaint  Patient presents with   Medical Management of Chronic Issues    Routine follow up visit    HPI:  Pt is a 86 y.o. male seen today for medical management of chronic diseases.    ED eval 10/09/19 for fall at [facility], resulted in CT head/neck no acute process, X-ray hip/pelvis no acute bony abnormalities, X-ray L ankle a distal tib/fib closed fx, 10/14/19 pending Ortho eval/tx.   Chronic pain, takes Tylenol  bid,  q8h prn, Gabapentin  qd, Lidocaine patch, Oxycodone  qd for osteoarthritic pain and peripheral neuropathy.              Constipation, takes Bisacodyl suppository  qod, MiraLax bid, Senokot S II qhs             Urinary frequency, takes Finasteride  qd             Allergic rhinitis, takes Flonase qd             Hypothyroidism, takes Levothyroxine qd.              GERD, takes Omeprazole  qd  Anemia, Hgb 9.5 10/13/19, baseline 11s, added Fe x2/wk 10/12/19   Past Medical History:  Diagnosis Date   AAA (abdominal aortic aneurysm) (HCC)    Abnormality of gait 08/01/2010   Adhesive capsulitis of shoulder 08/01/2010   "Frozen" right shoulder   Anemia    Anxiety state, unspecified 08/01/2010   Arthritis    Congestive heart failure, unspecified 08/01/2010   Cough 08/07/2015   Disorders of bursae and tendons in shoulder region, unspecified 08/01/2010   Edema 08/01/2010   Fall    Fecal impaction [facility])    GERD (gastroesophageal reflux disease)    Hiatal hernia    Hypercalcemia    Hyperlipidemia    Hypertension    Hypertrophy of prostate without urinary obstruction and other lower urinary tract symptoms (LUTS) 08/01/2010   Hyponatremia    Hypothyroidism    Infectious colitis    Insomnia, unspecified 08/01/2010   Joint pain    Leg pain    with walking   Lumbago 12/24/2010   Orthostatic hypotension 11/05/2010   Other specified  cardiac dysrhythmias(427.89) 12/24/2010   Pain in joint, pelvic region and thigh 08/06/2010   Peptic ulcer, unspecified site, unspecified as acute or chronic, without mention of hemorrhage, perforation, or obstruction 08/01/2010   Renal failure, acute (HCC)    Swelling, mass, or lump in head and neck 01/03/2011   Ulcer    Stomach   Unspecified disorder of kidney and ureter 08/01/2010   Unspecified hearing loss 08/01/2010   Unspecified vitamin D deficiency 08/06/2010   Urinary frequency 09/13/2010   Vertigo    Past Surgical History:  Procedure Laterality Date   ABDOMINAL AORTIC ANEURYSM REPAIR  12-18-10   Stent graft repair of AAA    APPENDECTOMY     CATARACT EXTRACTION     HEMORRHOID SURGERY  2000   INGUINAL HERNIA REPAIR Left 2005   TRANSTHORACIC ECHOCARDIOGRAM  07/19/2010    Left ventricle: There is hypokinesis of the inferior wall and  posterior wall. The EF is 35-40%. The cavity size was normal    Allergies  Allergen Reactions   Aleve [Naproxen] Other (See Comments)    Per MAR   Indocin [Indomethacin] Other (See Comments)    Per MAR    Allergies as of 10/14/2019      Reactions   Aleve [naproxen] Other (See Comments)   Per MAR   Indocin [indomethacin] Other (See Comments)   [facility]      Medication List       Accurate as of October 14, 2019 11:24 AM. If you have any questions, ask your nurse or doctor.        acetaminophen 500 MG tablet Commonly known as: TYLENOL Take 1,000 mg by mouth 2 (two) times daily.   acetaminophen 500 MG tablet Commonly known as: TYLENOL Take 500 mg by mouth every 8 (eight) hours as needed.   Aspercreme Lidocaine 4 % Ptch Generic drug: Lidocaine Apply 1 patch topically in the morning. Apply to mid lower back   bisacodyl 10 MG suppository Commonly known as: DULCOLAX Place 10 mg rectally every other day. For constipation Hold for loose stools   Cholecalciferol 25 MCG (1000 UT) tablet Take 2,000 Units by mouth daily.   ferrous sulfate 325 (65 FE) MG tablet Take 325 mg by mouth See admin instructions. QAM Monday and Friday   finasteride 5 MG tablet Commonly known as: PROSCAR Take 5 mg by mouth daily. Women who are pregnant or may become pregnant should use precautions when handling this medication.   fluticasone 50 MCG/ACT nasal spray Commonly known as: FLONASE Place 2 sprays into both nostrils at bedtime.   gabapentin 100 MG capsule Commonly known as: NEURONTIN Take 100 mg by mouth daily.   levothyroxine 50 MCG tablet Commonly known as: SYNTHROID Take 50 mcg by mouth daily before breakfast.   OcuSoft Eyelid Cleansing Pads Apply topically. 1 pad to  each eye once a morning   omeprazole 20 MG tablet Commonly known as: PRILOSEC OTC Take 20 mg by mouth daily.   oxyCODONE 10 mg 12 hr tablet Commonly known as: OxyCONTIN Take 1 tablet (10 mg total) by mouth daily. At 6am. Do Not Crush   polyethylene glycol 17 g packet Commonly known as: MIRALAX / GLYCOLAX Take 17 g by mouth 2 (two) times daily. Mix in 4-8 ounces of fluids for constipation,Hold for loose stools   sennosides-docusate sodium 8.6-50 MG tablet Commonly known as: SENOKOT-S Take 2 tablets by mouth at bedtime.   SleepRight Breathe Aid Misc 1 strip at bedtime.  Review of Systems  Constitutional: Negative for fatigue, fever and unexpected weight change.  HENT: Positive for hearing loss. Negative for congestion and voice change.   Eyes: Negative for visual disturbance.  Respiratory: Positive for shortness of breath. Negative for cough and wheezing.        DOE  Cardiovascular: Positive for leg swelling. Negative for chest pain and palpitations.  Gastrointestinal: Negative for abdominal pain, constipation, nausea and vomiting.  Genitourinary: Negative for difficulty urinating and dysuria.  Musculoskeletal: Positive for arthralgias, back pain and gait problem.       Left distal tib/fib fx  Skin: Negative for color change.  Neurological: Positive for weakness. Negative for dizziness, facial asymmetry, speech difficulty and headaches.  Psychiatric/Behavioral: Negative for agitation and sleep disturbance. The patient is not nervous/anxious.     Immunization History  Administered Date(s) Administered   Influenza, High Dose Seasonal PF 12/21/2018   Influenza-Unspecified 12/27/2010, 12/29/2011, 12/27/2012, 12/13/2013, 12/14/2014, 12/20/2015, 12/17/2016, 12/14/2017   Moderna SARS-COVID-2 Vaccination 03/14/2019, 04/11/2019   PPD Test 07/30/2010, 08/14/2010   Pneumococcal Conjugate-13 11/04/2016   Pneumococcal Polysaccharide-23 03/10/2005   Td 03/11/1999   Tdap  10/31/2016   Zoster Recombinat (Shingrix) 11/12/2018   Pertinent  Health Maintenance Due  Topic Date Due   INFLUENZA VACCINE  10/09/2019   PNA vac Low Risk Adult  Completed   Fall Risk  11/04/2017 10/29/2016 08/07/2015 08/07/2015 04/10/2015  Falls in the past year? No Yes Yes No No  Comment - - 06/02/15- skin tear right arm - -  Number falls in past yr: - 1 - - -  Injury with Fall? - No - - -   Functional Status Survey:    Vitals:   10/14/19 0940  BP: 120/86  Pulse: 70  Resp: 20  Temp: (!) 97.5 F (36.4 C)  SpO2: 95%  Weight: 128 lb 3.2 oz (58.2 kg)  Height: 5\' 9"  (1.753 m)   Body mass index is 18.93 kg/m. Physical Exam Vitals and nursing note reviewed.  Constitutional:      Appearance: Normal appearance.     Comments: Frail.   HENT:     Head: Normocephalic.     Mouth/Throat:     Mouth: Mucous membranes are moist.  Eyes:     Extraocular Movements: Extraocular movements intact.     Conjunctiva/sclera: Conjunctivae normal.     Pupils: Pupils are equal, round, and reactive to light.  Cardiovascular:     Rate and Rhythm: Normal rate and regular rhythm.     Heart sounds: Murmur heard.   Pulmonary:     Breath sounds: No wheezing, rhonchi or rales.  Abdominal:     General: Bowel sounds are normal.     Palpations: Abdomen is soft.     Tenderness: There is no abdominal tenderness.  Musculoskeletal:     Cervical back: Normal range of motion.     Right lower leg: No edema.     Left lower leg: Edema present.     Comments: R+L should pain with resistance, decreased ROM with both flexion and extension. No long ambulatory. Transfers self from/to bed/power wheelchair/toilet. Left tib/fib fx, moderate swelling LLE  Skin:    General: Skin is warm and dry.     Comments: Left lower leg is covered in splint/wraping, dark redness seen in toes, able to move toes.   Neurological:     General: No focal deficit present.     Mental Status: He is alert. Mental status is at baseline.  Motor: Weakness present.     Coordination: Coordination normal.     Gait: Gait abnormal.     Comments: Oriented to person, place. Lower body weakness.   Psychiatric:        Mood and Affect: Mood normal.        Behavior: Behavior normal.        Thought Content: Thought content normal.     Labs reviewed: Recent Labs    04/28/19 0000 09/08/19 0000 09/13/19 0000  NA 138 133* 135*  K 4.2 5.7* 4.5  CL 108 102 103  CO2 22 27* 23*  BUN 31* 36* 33*  CREATININE 1.0 1.1 1.2  CALCIUM 8.5* 8.6* 8.4*   Recent Labs    11/03/18 0000 04/28/19 0000 09/08/19 0000  AST 14 13* 14  ALT 6* 6* 4*  ALKPHOS 132* 136* 108  PROT 6.2*  --   --   ALBUMIN 3.7 3.4* 3.6   Recent Labs    11/03/18 0000 04/28/19 0000 09/08/19 0000  WBC 7.2 6.2 7.5  NEUTROABS 4,550 4,104 4,080  HGB 9.6* 9.7* 8.9*  HCT 30* 30* 28*  PLT 203 209 198   Lab Results  Component Value Date   TSH 1.89 04/28/2019   Lab Results  Component Value Date   HGBA1C 5.6 04/06/2017   Lab Results  Component Value Date   CHOL 145 04/06/2017   HDL 47 04/06/2017   LDLCALC 89 04/06/2017   TRIG 47 04/06/2017   CHOLHDL 3.1 04/06/2017    Significant Diagnostic Results in last 30 days:  DG Ankle Complete Left  Result Date: 10/09/2019 CLINICAL DATA:  Unwitnessed fall.  Left ankle deformity EXAM: LEFT ANKLE COMPLETE - 3+ VIEW COMPARISON:  None. FINDINGS: Acute comminuted fractures of the distal left tibial and fibular metadiaphyses. Both fractures are minimally posteriorly displaced with mild valgus and anterior apex angulation. Both fractures appear to be extra-articular. The ankle mortise is congruent. Bones are diffusely demineralized. Relatively mild soft tissue swelling. Advanced vascular calcifications. IMPRESSION: Acute comminuted fractures of the distal left tibial and fibular metadiaphyses. Both fractures are minimally posteriorly displaced and mildly angulated. Electronically Signed   By: Duanne Guess D.O.   On:  10/09/2019 15:34   CT Head Wo Contrast  Result Date: 10/09/2019 CLINICAL DATA:  Neck trauma. Un witnessed fall without neurologic deficit by report. EXAM: CT HEAD WITHOUT CONTRAST CT CERVICAL SPINE WITHOUT CONTRAST TECHNIQUE: Multidetector CT imaging of the head and cervical spine was performed following the standard protocol without intravenous contrast. Multiplanar CT image reconstructions of the cervical spine were also generated. COMPARISON:  Jul 18, 2010 FINDINGS: CT HEAD FINDINGS Brain: No evidence of acute infarction, hemorrhage, hydrocephalus, extra-axial collection or mass lesion/mass effect. Generalized atrophy and chronic microvascular ischemic changes are similar to the prior exam. Vascular: No hyperdense vessel or unexpected calcification. Skull: Normal. Negative for fracture or focal lesion. Sinuses/Orbits: No acute finding. Other: None. CT CERVICAL SPINE FINDINGS Alignment: Reversal of normal cervical lordotic curvature at C4-5 and C5-6, associated with degenerative changes. Skull base and vertebrae: Extensive degenerative changes at the atlantooccipital joint with degenerative pannus formation about the odontoid. Canal narrowing just below the foramen magnum due to the presence of this degenerative process shows a chronic appearance. Some flattening of the contour of the thecal sac at this level. No sign of fracture. Soft tissues and spinal canal: No prevertebral fluid or swelling. No visible canal hematoma. Disc levels: Multilevel degenerative changes, at the skull base as described above and in the  mid cervical spine with alignment changes likely related to degenerative changes also described above. Disc space narrowing greatest at C5-6 and C6-7 with uncovertebral degenerative changes and some canal narrowing related to osteophyte formation. Upper chest: Negative. Other: None IMPRESSION: 1. No acute intracranial abnormality. 2. Generalized atrophy and chronic microvascular ischemic changes of  the brain, similar to the prior exam. 3. No evidence of acute fracture or subluxation of the cervical spine. 4. Multilevel degenerative changes of the cervical spine, at the skull base and in the mid cervical spine with alignment changes and central canal narrowing related to degenerative changes also described above. Electronically Signed   By: Donzetta Kohut M.D.   On: 10/09/2019 15:47   CT Cervical Spine Wo Contrast  Result Date: 10/09/2019 CLINICAL DATA:  Neck trauma. Un witnessed fall without neurologic deficit by report. EXAM: CT HEAD WITHOUT CONTRAST CT CERVICAL SPINE WITHOUT CONTRAST TECHNIQUE: Multidetector CT imaging of the head and cervical spine was performed following the standard protocol without intravenous contrast. Multiplanar CT image reconstructions of the cervical spine were also generated. COMPARISON:  Jul 18, 2010 FINDINGS: CT HEAD FINDINGS Brain: No evidence of acute infarction, hemorrhage, hydrocephalus, extra-axial collection or mass lesion/mass effect. Generalized atrophy and chronic microvascular ischemic changes are similar to the prior exam. Vascular: No hyperdense vessel or unexpected calcification. Skull: Normal. Negative for fracture or focal lesion. Sinuses/Orbits: No acute finding. Other: None. CT CERVICAL SPINE FINDINGS Alignment: Reversal of normal cervical lordotic curvature at C4-5 and C5-6, associated with degenerative changes. Skull base and vertebrae: Extensive degenerative changes at the atlantooccipital joint with degenerative pannus formation about the odontoid. Canal narrowing just below the foramen magnum due to the presence of this degenerative process shows a chronic appearance. Some flattening of the contour of the thecal sac at this level. No sign of fracture. Soft tissues and spinal canal: No prevertebral fluid or swelling. No visible canal hematoma. Disc levels: Multilevel degenerative changes, at the skull base as described above and in the mid cervical spine  with alignment changes likely related to degenerative changes also described above. Disc space narrowing greatest at C5-6 and C6-7 with uncovertebral degenerative changes and some canal narrowing related to osteophyte formation. Upper chest: Negative. Other: None IMPRESSION: 1. No acute intracranial abnormality. 2. Generalized atrophy and chronic microvascular ischemic changes of the brain, similar to the prior exam. 3. No evidence of acute fracture or subluxation of the cervical spine. 4. Multilevel degenerative changes of the cervical spine, at the skull base and in the mid cervical spine with alignment changes and central canal narrowing related to degenerative changes also described above. Electronically Signed   By: Donzetta Kohut M.D.   On: 10/09/2019 15:47   DG HIPS BILAT WITH PELVIS MIN 5 VIEWS  Result Date: 10/09/2019 CLINICAL DATA:  Unwitnessed fall EXAM: DG HIP (WITH OR WITHOUT PELVIS) 5+V BILAT COMPARISON:  07/18/2010 FINDINGS: Diffuse osseous demineralization. Bilateral hips are intact without evidence of fracture or dislocation. Joint spaces are relatively preserved. Pelvic bony ring appears intact. Lower lumbar spondylosis. Vascular calcification. Aortoiliac stent grafts partially visualized. IMPRESSION: No acute osseous abnormality of the bilateral hips. Bones are diffusely demineralized. If there is high clinical suspicion for occult fracture, an MRI could be considered. Electronically Signed   By: Duanne Guess D.O.   On: 10/09/2019 15:32    Assessment/Plan Closed fracture of left tibia and fibula ED eval 10/09/19 for fall at [facility], resulted in CT head/neck no acute process, X-ray hip/pelvis no acute bony abnormalities,  X-ray L ankle a distal tib/fib closed fx, 10/14/19 pending Ortho eval/tx.    Osteoarthritis Chronic pain, continue Tylenol 1000mg  bid, 500mg  q8h prn, Gabapentin 100mg  qd, Lidocaine patch, Oxycodone 10mg  qd for osteoarthritic pain and peripheral  neuropathy.  Constipation Stable, continue Bisacodyl suppository 10mg  qod, MiraLax bid, Senokot S II qhs   Iron deficiency anemia Hgb 9.5 10/13/19, baseline 11s, added Fe x2/wk 10/12/19   BPH (benign prostatic hyperplasia) No urinary retention, continue Finasteride.   Hypothyroidism TSH 1.89 04/28/19, continue Levothyroxine.   GERD Stable, continue Omeprazole.   Allergic rhinitis Persisted, continue Fonase for symptomatic control.      Family/ staff Communication: plan of care reviewed with the patient and charge nurse.   Labs/tests ordered:  none  Time spend 40 minutes.

## 2019-10-14 NOTE — Assessment & Plan Note (Signed)
ED eval 10/09/19 for fall at [place] [facility], resulted in CT head/neck no acute process, X-ray hip/pelvis no acute bony abnormalities, X-ray L ankle a distal tib/fib closed fx, 10/14/19 pending Ortho eval/tx.

## 2019-10-14 NOTE — Assessment & Plan Note (Signed)
TSH 1.89 04/28/19, continue Levothyroxine.  

## 2019-10-14 NOTE — Assessment & Plan Note (Signed)
Chronic pain, continue Tylenol 1000mg  bid, 500mg  q8h prn, Gabapentin 100mg  qd, Lidocaine patch, Oxycodone 10mg  qd for osteoarthritic pain and peripheral neuropathy.

## 2019-10-14 NOTE — Assessment & Plan Note (Signed)
Hgb 9.5 10/13/19, baseline 11s, added Fe x2/wk 10/12/19

## 2019-10-14 NOTE — Assessment & Plan Note (Signed)
Stable, continue Bisacodyl suppository 10mg qod, MiraLax bid, Senokot S II qhs  

## 2019-10-14 NOTE — Assessment & Plan Note (Signed)
Stable, continue Omeprazole.  

## 2019-10-14 NOTE — Assessment & Plan Note (Signed)
No urinary retention, continue Finasteride.  

## 2019-10-14 NOTE — Assessment & Plan Note (Signed)
Persisted, continue Fonase for symptomatic control.

## 2019-10-28 ENCOUNTER — Other Ambulatory Visit: Payer: Self-pay | Admitting: *Deleted

## 2019-10-28 DIAGNOSIS — Z9181 History of falling: Secondary | ICD-10-CM | POA: Diagnosis not present

## 2019-10-28 DIAGNOSIS — M6281 Muscle weakness (generalized): Secondary | ICD-10-CM | POA: Diagnosis not present

## 2019-10-28 DIAGNOSIS — I10 Essential (primary) hypertension: Secondary | ICD-10-CM | POA: Diagnosis not present

## 2019-10-28 DIAGNOSIS — R2681 Unsteadiness on feet: Secondary | ICD-10-CM | POA: Diagnosis not present

## 2019-10-28 DIAGNOSIS — M792 Neuralgia and neuritis, unspecified: Secondary | ICD-10-CM

## 2019-10-28 MED ORDER — OXYCODONE HCL ER 10 MG PO T12A: 10.0000 mg | EXTENDED_RELEASE_TABLET | Freq: Every day | ORAL | 0 refills | Status: DC

## 2019-10-28 NOTE — Telephone Encounter (Signed)
Refill Requested from [facility] Pended Rx and sent to Dr. Chales Abrahams for approval.

## 2019-11-17 ENCOUNTER — Encounter: Payer: Self-pay | Admitting: Internal Medicine

## 2019-11-17 ENCOUNTER — Non-Acute Institutional Stay: Payer: Medicare PPO | Admitting: Internal Medicine

## 2019-11-17 DIAGNOSIS — R52 Pain, unspecified: Secondary | ICD-10-CM

## 2019-11-17 DIAGNOSIS — S82202D Unspecified fracture of shaft of left tibia, subsequent encounter for closed fracture with routine healing: Secondary | ICD-10-CM

## 2019-11-17 DIAGNOSIS — S82402D Unspecified fracture of shaft of left fibula, subsequent encounter for closed fracture with routine healing: Secondary | ICD-10-CM

## 2019-11-17 NOTE — Progress Notes (Signed)
Location:  [facility] Room Number: 32-A Place of Service:  ALF [phone]) Provider:  [name], MD  Patient Care Team: [name], MD as PCP - General (Internal Medicine) [name], Ricardo X, NP as Nurse Practitioner (Internal Medicine)  Extended Emergency Contact Information Primary Emergency Contact: Ricardo [name], Ricardo [name] of [place] Home Phone: [phone] Mobile Phone: [phone] Relation: Son Secondary Emergency Contact: [name] [place] of [place] Mobile Phone: [phone] Relation: Grandson  Code Status:  DNR Goals of care: Advanced Directive information Advanced Directives 11/17/2019  Does Patient Have a Medical Advance Directive? Yes  Type of Estate agent of [place];Living will;Out of facility DNR (pink MOST or yellow form)  Does patient want to make changes to medical advance directive? No - Patient declined  Copy of Healthcare Power of Attorney in Chart? No - copy requested  Would patient like information on creating a medical advance directive? -  Pre-existing out of facility DNR order (yellow form or pink MOST form) Pink MOST form placed in chart (order not valid for inpatient use)     Chief Complaint  Patient presents with  . Acute Visit    Patient is seen for pain control    HPI:  Pt is a 86 y.o. [name] seen today for an acute visit for Pain Management  Long term Resident of [place]. [facility] is a resident in [facility] He has a history of AAA with measurement of 9.3 cm in CT scanin07/19,hypertension, peripheral neuropathy, BPH, hypothyroid, GERDand very hard of hearing Recent Fall with distal tibia fib fracture Plan for Conservative Management with NWB and Hard Cast  He has been c/o Pain Especially in the Morning His caregiver he was screaming in pain when they were moving him. At this time he is unable to give me much history.  Is very hard of hearing No  swelling noticed in any of his extremities  Past Medical History:  Diagnosis Date  . AAA (abdominal aortic aneurysm) (HCC)   . Abnormality of gait 08/01/2010  . Adhesive capsulitis of shoulder 08/01/2010   "Frozen" right shoulder  . Anemia   . Anxiety state, unspecified 08/01/2010  . Arthritis   . Congestive heart failure, unspecified 08/01/2010  . Cough 08/07/2015  . Disorders of bursae and tendons in shoulder region, unspecified 08/01/2010  . Edema 08/01/2010  . Fall   . Fecal impaction (HCC)   . GERD (gastroesophageal reflux disease)   . Hiatal hernia   . Hypercalcemia   . Hyperlipidemia   . Hypertension   . Hypertrophy of prostate without urinary obstruction and other lower urinary tract symptoms (LUTS) 08/01/2010  . Hyponatremia   . Hypothyroidism   . Infectious colitis   . Insomnia, unspecified 08/01/2010  . Joint pain   . Leg pain    with walking  . Lumbago 12/24/2010  . Orthostatic hypotension 11/05/2010  . Other specified cardiac dysrhythmias(427.89) 12/24/2010  . Pain in joint, pelvic region and thigh 08/06/2010  . Peptic ulcer, unspecified site, unspecified as acute or chronic, without mention of hemorrhage, perforation, or obstruction 08/01/2010  . Renal failure, acute (HCC)   . Swelling, mass, or lump in head and neck 01/03/2011  . Ulcer    Stomach  . Unspecified disorder of kidney and ureter 08/01/2010  . Unspecified hearing loss 08/01/2010  . Unspecified vitamin D deficiency 08/06/2010  . Urinary frequency 09/13/2010  . Vertigo  Past Surgical History:  Procedure Laterality Date  . ABDOMINAL AORTIC ANEURYSM REPAIR  12-18-10   Stent graft repair of AAA  . APPENDECTOMY    . CATARACT EXTRACTION    . HEMORRHOID SURGERY  2000  . INGUINAL HERNIA REPAIR Left 2005  . TRANSTHORACIC ECHOCARDIOGRAM  07/19/2010    Left ventricle: There is hypokinesis of the inferior wall and  posterior wall. The EF is 35-40%. The cavity size was normal    Allergies  Allergen Reactions  .  Aleve [Naproxen] Other (See Comments)    Per MAR  . Indocin [Indomethacin] Other (See Comments)    Per [facility]    Outpatient Encounter Medications as of 11/17/2019  Medication Sig  . acetaminophen (TYLENOL) 500 MG tablet Take 1,000 mg by mouth 2 (two) times daily.   Marland Kitchen acetaminophen (TYLENOL) 500 MG tablet Take 500 mg by mouth every 8 (eight) hours as needed.   . bisacodyl (DULCOLAX) 10 MG suppository Place 10 mg rectally every other day. For constipation Hold for loose stools  . Cholecalciferol 25 MCG (1000 UT) tablet Take 2,000 Units by mouth daily.   . Eyelid Cleansers (OCUSOFT EYELID CLEANSING) PADS Apply topically. 1 pad to each eye once a morning  . ferrous sulfate 325 (65 FE) MG tablet Take 325 mg by mouth See admin instructions. QAM Monday and Friday  . finasteride (PROSCAR) 5 MG tablet Take 5 mg by mouth daily. Women who are pregnant or may become pregnant should use precautions when handling this medication.  . fluticasone (FLONASE) 50 MCG/ACT nasal spray Place 2 sprays into both nostrils at bedtime.   . gabapentin (NEURONTIN) 100 MG capsule Take 100 mg by mouth daily.  Marland Kitchen levothyroxine (SYNTHROID) 50 MCG tablet Take 50 mcg by mouth daily before breakfast.  . Lidocaine (ASPERCREME LIDOCAINE) 4 % PTCH Apply 1 patch topically in the morning. Apply to mid lower back  . methocarbamol (ROBAXIN) 500 MG tablet Take 500 mg by mouth daily as needed for muscle spasms.  . Misc. Devices (SLEEPRIGHT BREATHE AID) MISC 1 strip at bedtime.  Marland Kitchen omeprazole (PRILOSEC OTC) 20 MG tablet Take 20 mg by mouth daily.  Marland Kitchen oxyCODONE (OXYCONTIN) 10 mg 12 hr tablet Take 1 tablet (10 mg total) by mouth daily. At 6am. Do Not Crush  . polyethylene glycol (MIRALAX / GLYCOLAX) packet Take 17 g by mouth 2 (two) times daily. Mix in 4-8 ounces of fluids for constipation,Hold for loose stools  . sennosides-docusate sodium (SENOKOT-S) 8.6-50 MG tablet Take 2 tablets by mouth at bedtime.   No facility-administered encounter  medications on file as of 11/17/2019.    Review of Systems  Unable to perform ROS: Other    Immunization History  Administered Date(s) Administered  . Influenza, High Dose Seasonal PF 12/21/2018  . Influenza-Unspecified 12/27/2010, 12/29/2011, 12/27/2012, 12/13/2013, 12/14/2014, 12/20/2015, 12/17/2016, 12/14/2017  . Moderna SARS-COVID-2 Vaccination 03/14/2019, 04/11/2019  . PPD Test 07/30/2010, 08/14/2010  . Pneumococcal Conjugate-13 11/04/2016  . Pneumococcal Polysaccharide-23 03/10/2005  . Td 03/11/1999  . Tdap 10/31/2016  . Zoster Recombinat (Shingrix) 11/12/2018   Pertinent  Health Maintenance Due  Topic Date Due  . INFLUENZA VACCINE  10/09/2019  . PNA vac Low Risk Adult  Completed   Fall Risk  11/04/2017 10/29/2016 08/07/2015 08/07/2015 04/10/2015  Falls in the past year? No Yes Yes No No  Comment - - 06/02/15- skin tear right arm - -  Number falls in past yr: - 1 - - -  Injury with Fall? - No - - -  Functional Status Survey:    Vitals:   11/17/19 1355  BP: 130/77  Pulse: (!) 59  Resp: 16  Temp: (!) 97.5 F (36.4 C)  TempSrc: Oral  SpO2: 95%  Weight: 124 lb (56.2 kg)  Height: 5\' 9"  (1.753 m)   Body mass index is 18.31 kg/m. Physical Exam Vitals reviewed.  Constitutional:      Appearance: Normal appearance.  HENT:     Head: Normocephalic.     Nose: Nose normal.     Mouth/Throat:     Mouth: Mucous membranes are moist.     Pharynx: Oropharynx is clear.  Eyes:     Pupils: Pupils are equal, round, and reactive to light.  Cardiovascular:     Rate and Rhythm: Normal rate and regular rhythm.     Pulses: Normal pulses.  Pulmonary:     Effort: Pulmonary effort is normal.     Breath sounds: Normal breath sounds.  Abdominal:     General: Abdomen is flat. Bowel sounds are normal.     Palpations: Abdomen is soft.  Musculoskeletal:     Cervical back: Neck supple.     Comments: No Swelling in Either of Extremities Cast does not feel tight  Skin:    General: Skin  is warm.  Neurological:     General: No focal deficit present.     Mental Status: He is alert.  Psychiatric:        Mood and Affect: Mood normal.     Labs reviewed: Recent Labs    04/28/19 0000 09/08/19 0000 09/13/19 0000  NA 138 133* 135*  K 4.2 5.7* 4.5  CL 108 102 103  CO2 22 27* 23*  BUN 31* 36* 33*  CREATININE 1.0 1.1 1.2  CALCIUM 8.5* 8.6* 8.4*   Recent Labs    04/28/19 0000 09/08/19 0000  AST 13* 14  ALT 6* 4*  ALKPHOS 136* 108  ALBUMIN 3.4* 3.6   Recent Labs    04/28/19 0000 09/08/19 0000 10/13/19 0000  WBC 6.2 7.5 7.9  NEUTROABS 4,104 4,080 5,491  HGB 9.7* 8.9* 9.5*  HCT 30* 28* 30*  PLT 209 198 203   Lab Results  Component Value Date   TSH 1.89 04/28/2019   Lab Results  Component Value Date   HGBA1C 5.6 04/06/2017   Lab Results  Component Value Date   CHOL 145 04/06/2017   HDL 47 04/06/2017   LDLCALC 89 04/06/2017   TRIG 47 04/06/2017   CHOLHDL 3.1 04/06/2017    Significant Diagnostic Results in last 30 days:  No results found.  Assessment/Plan  Closed fracture of left tibia and fibula with routine healing, subsequent encounter Cast looks Good. No Swelling  Has follow up with Ortho Pain management C/o Pain in the Morning. increase OxyContin to BID  Robaxin 250 mg Prn for Muscle Spasms If Pain not better Urgent ortho Appointment  Other Issues  Anemia of chronic disease Repeat hemoglobin is 9.5 Low-dose of iron  Stage 3a chronic kidney disease BUN and creatinine are stable at this time Benign prostatic hyperplasia with urinary hesitancy Continue Proscar   Family/ staff Communication:   Labs/tests ordered:  BMP and CBC

## 2019-11-21 DIAGNOSIS — R7309 Other abnormal glucose: Secondary | ICD-10-CM | POA: Diagnosis not present

## 2019-11-21 DIAGNOSIS — I10 Essential (primary) hypertension: Secondary | ICD-10-CM | POA: Diagnosis not present

## 2019-11-21 DIAGNOSIS — D649 Anemia, unspecified: Secondary | ICD-10-CM | POA: Diagnosis not present

## 2019-11-21 DIAGNOSIS — Z13220 Encounter for screening for lipoid disorders: Secondary | ICD-10-CM | POA: Diagnosis not present

## 2019-11-21 LAB — BASIC METABOLIC PANEL
BUN: 35 — AB (ref 4–21)
CO2: 28 — AB (ref 13–22)
Chloride: 106 (ref 99–108)
Creatinine: 1.1 (ref 0.6–1.3)
Glucose: 72
Potassium: 4.6 (ref 3.4–5.3)
Sodium: 137 (ref 137–147)

## 2019-11-21 LAB — CBC AND DIFFERENTIAL
HCT: 30 — AB (ref 41–53)
Hemoglobin: 9.6 — AB (ref 13.5–17.5)
Platelets: 167 (ref 150–399)
WBC: 6.2

## 2019-11-21 LAB — COMPREHENSIVE METABOLIC PANEL WITH GFR
Calcium: 8.2 — AB (ref 8.7–10.7)
GFR calc Af Amer: 62
GFR calc non Af Amer: 54

## 2019-11-21 LAB — CBC: RBC: 3.36 — AB (ref 3.87–5.11)

## 2019-11-24 ENCOUNTER — Other Ambulatory Visit: Payer: Self-pay | Admitting: *Deleted

## 2019-11-24 DIAGNOSIS — M792 Neuralgia and neuritis, unspecified: Secondary | ICD-10-CM

## 2019-11-24 MED ORDER — OXYCODONE HCL ER 10 MG PO T12A: 10.0000 mg | EXTENDED_RELEASE_TABLET | Freq: Every day | ORAL | 0 refills | Status: DC

## 2019-11-24 NOTE — Telephone Encounter (Signed)
Received refill Request From [facility] Pended Rx and sent to Dr. Gupta for approval.  

## 2019-11-28 DIAGNOSIS — Z9181 History of falling: Secondary | ICD-10-CM | POA: Diagnosis not present

## 2019-11-28 DIAGNOSIS — I10 Essential (primary) hypertension: Secondary | ICD-10-CM | POA: Diagnosis not present

## 2019-11-28 DIAGNOSIS — M6281 Muscle weakness (generalized): Secondary | ICD-10-CM | POA: Diagnosis not present

## 2019-11-28 DIAGNOSIS — R2681 Unsteadiness on feet: Secondary | ICD-10-CM | POA: Diagnosis not present

## 2019-12-06 DIAGNOSIS — M25572 Pain in left ankle and joints of left foot: Secondary | ICD-10-CM | POA: Diagnosis not present

## 2019-12-07 ENCOUNTER — Non-Acute Institutional Stay: Payer: Medicare PPO | Admitting: Internal Medicine

## 2019-12-07 ENCOUNTER — Encounter: Payer: Self-pay | Admitting: Internal Medicine

## 2019-12-07 DIAGNOSIS — R3911 Hesitancy of micturition: Secondary | ICD-10-CM | POA: Diagnosis not present

## 2019-12-07 DIAGNOSIS — M25572 Pain in left ankle and joints of left foot: Secondary | ICD-10-CM | POA: Diagnosis not present

## 2019-12-07 DIAGNOSIS — E039 Hypothyroidism, unspecified: Secondary | ICD-10-CM | POA: Diagnosis not present

## 2019-12-07 DIAGNOSIS — R531 Weakness: Secondary | ICD-10-CM

## 2019-12-07 DIAGNOSIS — M6281 Muscle weakness (generalized): Secondary | ICD-10-CM | POA: Diagnosis not present

## 2019-12-07 DIAGNOSIS — R05 Cough: Secondary | ICD-10-CM

## 2019-12-07 DIAGNOSIS — R52 Pain, unspecified: Secondary | ICD-10-CM

## 2019-12-07 DIAGNOSIS — E785 Hyperlipidemia, unspecified: Secondary | ICD-10-CM | POA: Diagnosis not present

## 2019-12-07 DIAGNOSIS — D509 Iron deficiency anemia, unspecified: Secondary | ICD-10-CM

## 2019-12-07 DIAGNOSIS — N401 Enlarged prostate with lower urinary tract symptoms: Secondary | ICD-10-CM | POA: Diagnosis not present

## 2019-12-07 DIAGNOSIS — S82892A Other fracture of left lower leg, initial encounter for closed fracture: Secondary | ICD-10-CM | POA: Diagnosis not present

## 2019-12-07 DIAGNOSIS — Z9181 History of falling: Secondary | ICD-10-CM | POA: Diagnosis not present

## 2019-12-07 DIAGNOSIS — K219 Gastro-esophageal reflux disease without esophagitis: Secondary | ICD-10-CM | POA: Diagnosis not present

## 2019-12-07 DIAGNOSIS — R2681 Unsteadiness on feet: Secondary | ICD-10-CM | POA: Diagnosis not present

## 2019-12-07 DIAGNOSIS — R059 Cough, unspecified: Secondary | ICD-10-CM

## 2019-12-07 DIAGNOSIS — E559 Vitamin D deficiency, unspecified: Secondary | ICD-10-CM | POA: Diagnosis not present

## 2019-12-07 DIAGNOSIS — I719 Aortic aneurysm of unspecified site, without rupture: Secondary | ICD-10-CM | POA: Diagnosis not present

## 2019-12-07 DIAGNOSIS — J189 Pneumonia, unspecified organism: Secondary | ICD-10-CM | POA: Diagnosis not present

## 2019-12-07 DIAGNOSIS — Z0189 Encounter for other specified special examinations: Secondary | ICD-10-CM | POA: Diagnosis not present

## 2019-12-07 NOTE — Progress Notes (Signed)
Location:    [facility] Room Number: 32 Place of Service:  ALF [phone]) Provider:  Einar Crow MD  [name], MD  Patient Care Team: [name], MD as PCP - General (Internal Medicine) Mast, Man X, NP as Nurse Practitioner (Internal Medicine)  Extended Emergency Contact Information Primary Emergency Contact: [name], [place] of [place] Home Phone: [phone] Mobile Phone: [phone] Relation: Son Secondary Emergency Contact: [name] [place] of [place] Mobile Phone: [phone] Relation: Grandson  Code Status:  DNR Goals of care: Advanced Directive information Advanced Directives 11/17/2019  Does Patient Have a Medical Advance Directive? Yes  Type of Estate agent of [place];Living will;Out of facility DNR (pink MOST or yellow form)  Does patient want to make changes to medical advance directive? No - Patient declined  Copy of Healthcare Power of Attorney in Chart? No - copy requested  Would patient like information on creating a medical advance directive? -  Pre-existing out of facility DNR order (yellow form or pink MOST form) Pink MOST form placed in chart (order not valid for inpatient use)     Chief Complaint  Patient presents with  . Acute Visit    Refusing medications    HPI:  Pt is a 86 y.o. male seen today for an acute visit for Spitting his Meds, Feeling weak and Cough  Long term Resident of AL. Patient is a resident in [facility] He has a history of AAA with measurement of 9.3 cm in CT scanin07/19,hypertension, peripheral neuropathy, BPH, hypothyroid, GERDand very hard of hearing Recent Fall with Leftdistal tibia fib fracture Plan for Conservative Management with NWB and Hard Cast.  Cast is off now. And Ortho has suggested Range of motion and activity in that leg as tolerated Patient now is c/o Weakness. Cough and he was spitting food and  Medicine this morning. Is very hard to get much history from him as he is very hard of hearing and now was not able to read on the board and close his eyes.      Past Medical History:  Diagnosis Date  . AAA (abdominal aortic aneurysm) (HCC)   . Abnormality of gait 08/01/2010  . Adhesive capsulitis of shoulder 08/01/2010   "Frozen" right shoulder  . Anemia   . Anxiety state, unspecified 08/01/2010  . Arthritis   . Congestive heart failure, unspecified 08/01/2010  . Cough 08/07/2015  . Disorders of bursae and tendons in shoulder region, unspecified 08/01/2010  . Edema 08/01/2010  . Fall   . Fecal impaction (HCC)   . GERD (gastroesophageal reflux disease)   . Hiatal hernia   . Hypercalcemia   . Hyperlipidemia   . Hypertension   . Hypertrophy of prostate without urinary obstruction and other lower urinary tract symptoms (LUTS) 08/01/2010  . Hyponatremia   . Hypothyroidism   . Infectious colitis   . Insomnia, unspecified 08/01/2010  . Joint pain   . Leg pain    with walking  . Lumbago 12/24/2010  . Orthostatic hypotension 11/05/2010  . Other specified cardiac dysrhythmias(427.89) 12/24/2010  . Pain in joint, pelvic region and thigh 08/06/2010  . Peptic ulcer, unspecified site, unspecified as acute or chronic, without mention of hemorrhage, perforation, or obstruction 08/01/2010  . Renal failure, acute (HCC)   . Swelling, mass, or lump in head and neck 01/03/2011  . Ulcer    Stomach  . Unspecified disorder of  kidney and ureter 08/01/2010  . Unspecified hearing loss 08/01/2010  . Unspecified vitamin D deficiency 08/06/2010  . Urinary frequency 09/13/2010  . Vertigo    Past Surgical History:  Procedure Laterality Date  . ABDOMINAL AORTIC ANEURYSM REPAIR  12-18-10   Stent graft repair of AAA  . APPENDECTOMY    . CATARACT EXTRACTION    . HEMORRHOID SURGERY  2000  . INGUINAL HERNIA REPAIR Left 2005  . TRANSTHORACIC ECHOCARDIOGRAM  07/19/2010    Left ventricle: There is hypokinesis of  the inferior wall and  posterior wall. The EF is 35-40%. The cavity size was normal    Allergies  Allergen Reactions  . Aleve [Naproxen] Other (See Comments)    Per MAR  . Indocin [Indomethacin] Other (See Comments)    Per MAR    Allergies as of 12/07/2019      Reactions   Aleve [naproxen] Other (See Comments)   Per MAR   Indocin [indomethacin] Other (See Comments)   [facility]      Medication List       Accurate as of December 07, 2019 10:17 AM. If you have any questions, ask your nurse or doctor.        acetaminophen 500 MG tablet Commonly known as: TYLENOL Take 1,000 mg by mouth 2 (two) times daily.   acetaminophen 500 MG tablet Commonly known as: TYLENOL Take 500 mg by mouth every 8 (eight) hours as needed.   Aspercreme Lidocaine 4 % Ptch Generic drug: Lidocaine Apply 1 patch topically in the morning. Apply to mid lower back   bisacodyl 10 MG suppository Commonly known as: DULCOLAX Place 10 mg rectally every other day. For constipation Hold for loose stools   Cholecalciferol 25 MCG (1000 UT) tablet Take 2,000 Units by mouth daily.   ferrous sulfate 325 (65 FE) MG tablet Take 325 mg by mouth See admin instructions. QAM Monday and Friday   finasteride 5 MG tablet Commonly known as: PROSCAR Take 5 mg by mouth daily. Women who are pregnant or may become pregnant should use precautions when handling this medication.   fluticasone 50 MCG/ACT nasal spray Commonly known as: FLONASE Place 2 sprays into both nostrils at bedtime.   gabapentin 100 MG capsule Commonly known as: NEURONTIN Take 100 mg by mouth daily.   levothyroxine 50 MCG tablet Commonly known as: SYNTHROID Take 50 mcg by mouth daily before breakfast.   methocarbamol 500 MG tablet Commonly known as: ROBAXIN Take 250 mg by mouth daily as needed for muscle spasms.   OcuSoft Eyelid Cleansing Pads Apply topically. 1 pad to each eye once a morning   omeprazole 20 MG tablet Commonly known as:  PRILOSEC OTC Take 20 mg by mouth daily.   oxyCODONE 10 mg 12 hr tablet Commonly known as: OxyCONTIN Take 1 tablet (10 mg total) by mouth daily. At 6am. Do Not Crush What changed:   when to take this  additional instructions   polyethylene glycol 17 g packet Commonly known as: MIRALAX / GLYCOLAX Take 17 g by mouth 2 (two) times daily. Mix in 4-8 ounces of fluids for constipation,Hold for loose stools   sennosides-docusate sodium 8.6-50 MG tablet Commonly known as: SENOKOT-S Take 2 tablets by mouth at bedtime.   SleepRight Breathe Aid Misc 1 strip at bedtime.       Review of Systems  Constitutional: Positive for activity change and appetite change.  HENT: Negative.   Respiratory: Positive for cough.   Cardiovascular: Negative.   Gastrointestinal: Negative.  Genitourinary: Negative.   Musculoskeletal: Positive for gait problem.  Skin: Negative.   Neurological: Positive for weakness.  Psychiatric/Behavioral: Positive for behavioral problems, confusion and dysphoric mood.  All other systems reviewed and are negative.   Immunization History  Administered Date(s) Administered  . Influenza, High Dose Seasonal PF 12/21/2018  . Influenza-Unspecified 12/27/2010, 12/29/2011, 12/27/2012, 12/13/2013, 12/14/2014, 12/20/2015, 12/17/2016, 12/14/2017  . Moderna SARS-COVID-2 Vaccination 03/14/2019, 04/11/2019  . PPD Test 07/30/2010, 08/14/2010  . Pneumococcal Conjugate-13 11/04/2016  . Pneumococcal Polysaccharide-23 03/10/2005  . Td 03/11/1999  . Tdap 10/31/2016  . Zoster Recombinat (Shingrix) 11/12/2018   Pertinent  Health Maintenance Due  Topic Date Due  . INFLUENZA VACCINE  10/09/2019  . PNA vac Low Risk Adult  Completed   Fall Risk  11/04/2017 10/29/2016 08/07/2015 08/07/2015 04/10/2015  Falls in the past year? No Yes Yes No No  Comment - - 06/02/15- skin tear right arm - -  Number falls in past yr: - 1 - - -  Injury with Fall? - No - - -   Functional Status Survey:     Vitals:   12/07/19 1007  BP: 106/70  Pulse: (!) 106  Resp: 20  Temp: (!) 96.8 F (36 C)  SpO2: 93%  Weight: 124 lb (56.2 kg)  Height: 5\' 9"  (1.753 m)   Body mass index is 18.31 kg/m. Physical Exam Vitals reviewed.  Constitutional:      Appearance: Normal appearance.  HENT:     Head: Normocephalic.     Nose: Nose normal.     Mouth/Throat:     Mouth: Mucous membranes are moist.     Pharynx: Oropharynx is clear.  Eyes:     Pupils: Pupils are equal, round, and reactive to light.  Cardiovascular:     Rate and Rhythm: Normal rate.     Heart sounds: Murmur heard.   Pulmonary:     Effort: Pulmonary effort is normal.     Breath sounds: Normal breath sounds. No wheezing or rales.  Abdominal:     General: Abdomen is flat. Bowel sounds are normal.     Palpations: Abdomen is soft.  Musculoskeletal:     Cervical back: Neck supple.     Comments: Mild swelling in his Left leg  Skin:    General: Skin is warm.  Neurological:     General: No focal deficit present.     Mental Status: He is alert.  Psychiatric:        Mood and Affect: Mood normal.        Thought Content: Thought content normal.     Labs reviewed: Recent Labs    04/28/19 0000 09/08/19 0000 09/13/19 0000  NA 138 133* 135*  K 4.2 5.7* 4.5  CL 108 102 103  CO2 22 27* 23*  BUN 31* 36* 33*  CREATININE 1.0 1.1 1.2  CALCIUM 8.5* 8.6* 8.4*   Recent Labs    04/28/19 0000 09/08/19 0000  AST 13* 14  ALT 6* 4*  ALKPHOS 136* 108  ALBUMIN 3.4* 3.6   Recent Labs    04/28/19 0000 09/08/19 0000 10/13/19 0000  WBC 6.2 7.5 7.9  NEUTROABS 4,104 4,080 5,491  HGB 9.7* 8.9* 9.5*  HCT 30* 28* 30*  PLT 209 198 203   Lab Results  Component Value Date   TSH 1.89 04/28/2019   Lab Results  Component Value Date   HGBA1C 5.6 04/06/2017   Lab Results  Component Value Date   CHOL 145 04/06/2017   HDL 47  04/06/2017   LDLCALC 89 04/06/2017   TRIG 47 04/06/2017   CHOLHDL 3.1 04/06/2017    Significant  Diagnostic Results in last 30 days:  No results found.  Assessment/Plan Cough Get CBC CMP and Chest Xray  Possible aspiration Pneumonia Weakness CBC Refusing Meds Family wants Hospice consult S/P Left Tibia Fibula fracture Per ortho it has healed well They are suggesting activity as tolerated Pain management Continue Oxycodone same dose Hospice consult Benign prostatic hyperplasia with urinary hesitancy Continue Proscar Iron deficiency anemia, unspecified iron deficiency anemia type Low dose of Iron Gastroesophageal reflux disease without esophagitis On Omeprazole Hypothyroidism Check TSH also   Family/ staff Communication:   Labs/tests ordered:  CBC,CMP,TSH Stat with Chest Xray  Total time spent in this patient care encounter was  45_  minutes; greater than 50% of the visit spent counseling patient and staff, reviewing records , Labs and coordinating care for problems addressed at this encounter.

## 2019-12-08 ENCOUNTER — Non-Acute Institutional Stay: Payer: Medicare PPO | Admitting: Internal Medicine

## 2019-12-08 ENCOUNTER — Encounter: Payer: Self-pay | Admitting: Internal Medicine

## 2019-12-08 DIAGNOSIS — E860 Dehydration: Secondary | ICD-10-CM

## 2019-12-08 DIAGNOSIS — R3911 Hesitancy of micturition: Secondary | ICD-10-CM

## 2019-12-08 DIAGNOSIS — N401 Enlarged prostate with lower urinary tract symptoms: Secondary | ICD-10-CM | POA: Diagnosis not present

## 2019-12-08 DIAGNOSIS — R627 Adult failure to thrive: Secondary | ICD-10-CM

## 2019-12-08 DIAGNOSIS — R05 Cough: Secondary | ICD-10-CM | POA: Diagnosis not present

## 2019-12-08 DIAGNOSIS — R059 Cough, unspecified: Secondary | ICD-10-CM

## 2019-12-08 NOTE — Progress Notes (Signed)
Location: [facility] Room Number: 32 Place of Service:  ALF (13)  Provider:   Code Status: DNR Goals of Care:  Advanced Directives 12/08/2019  Does Patient Have a Medical Advance Directive? Yes  Type of Estate agent of [place];Living will;Out of facility DNR (pink MOST or yellow form)  Does patient want to make changes to medical advance directive? No - Patient declined  Copy of Healthcare Power of Attorney in Chart? Yes - validated most recent copy scanned in chart (See row information)  Would patient like information on creating a medical advance directive? -  Pre-existing out of facility DNR order (yellow form or pink MOST form) -     Chief Complaint  Patient presents with  . Acute Visit    Dehydration    HPI: Patient is a 86 y.o. male seen today for an acute visit for Elevated Bun and Confusion with Poor Appetite  Long term Resident of AL. [facility] is a [facility] in [facility] He has a history of AAA with measurement of 9.3 cm in CT scanin07/19,hypertension, peripheral neuropathy, BPH, hypothyroid, GERDand very hard of hearing Recent Fall with Leftdistal tibia fib fracture Plan for Conservative Management with NWB and Hard   Cast is off now. And Ortho has suggested Range of motion and activity in that leg as tolerated  Labs done yesterday show Elevated BUN of 59 creat is 1.3 Per his caregiver he has poor Appetite Chest Xray showed Chronic Changes no Pneumonia White count is normal Patient seems more confused today but otherwise no change  Past Medical History:  Diagnosis Date  . AAA (abdominal aortic aneurysm) (HCC)   . Abnormality of gait 08/01/2010  . Adhesive capsulitis of shoulder 08/01/2010   "Frozen" right shoulder  . Anemia   . Anxiety state, unspecified 08/01/2010  . Arthritis   . Congestive heart failure, unspecified 08/01/2010  . Cough 08/07/2015  . Disorders of bursae and tendons in shoulder region,  unspecified 08/01/2010  . Edema 08/01/2010  . Fall   . Fecal impaction (HCC)   . GERD (gastroesophageal reflux disease)   . Hiatal hernia   . Hypercalcemia   . Hyperlipidemia   . Hypertension   . Hypertrophy of prostate without urinary obstruction and other lower urinary tract symptoms (LUTS) 08/01/2010  . Hyponatremia   . Hypothyroidism   . Infectious colitis   . Insomnia, unspecified 08/01/2010  . Joint pain   . Leg pain    with walking  . Lumbago 12/24/2010  . Orthostatic hypotension 11/05/2010  . Other specified cardiac dysrhythmias(427.89) 12/24/2010  . Pain in joint, pelvic region and thigh 08/06/2010  . Peptic ulcer, unspecified site, unspecified as acute or chronic, without mention of hemorrhage, perforation, or obstruction 08/01/2010  . Renal failure, acute (HCC)   . Swelling, mass, or lump in head and neck 01/03/2011  . Ulcer    Stomach  . Unspecified disorder of kidney and ureter 08/01/2010  . Unspecified hearing loss 08/01/2010  . Unspecified vitamin D deficiency 08/06/2010  . Urinary frequency 09/13/2010  . Vertigo     Past Surgical History:  Procedure Laterality Date  . ABDOMINAL AORTIC ANEURYSM REPAIR  12-18-10   Stent graft repair of AAA  . APPENDECTOMY    . CATARACT EXTRACTION    . HEMORRHOID SURGERY  2000  . INGUINAL HERNIA REPAIR Left 2005  . TRANSTHORACIC ECHOCARDIOGRAM  07/19/2010    Left ventricle: There is hypokinesis of the inferior wall and  posterior  wall. The EF is 35-40%. The cavity size was normal    Allergies  Allergen Reactions  . Aleve [Naproxen] Other (See Comments)    Per MAR  . Indocin [Indomethacin] Other (See Comments)    Per [facility]    Outpatient Encounter Medications as of 12/08/2019  Medication Sig  . acetaminophen (TYLENOL) 500 MG tablet Take 1,000 mg by mouth 2 (two) times daily.   Marland Kitchen acetaminophen (TYLENOL) 500 MG tablet Take 500 mg by mouth every 8 (eight) hours as needed.   . bisacodyl (DULCOLAX) 10 MG suppository Place 10 mg rectally  every other day. For constipation Hold for loose stools  . Cholecalciferol 25 MCG (1000 UT) tablet Take 2,000 Units by mouth daily.   . Eyelid Cleansers (OCUSOFT EYELID CLEANSING) PADS Apply topically. 1 pad to each eye once a morning  . ferrous sulfate 325 (65 FE) MG tablet Take 325 mg by mouth See admin instructions. QAM Monday and Friday  . finasteride (PROSCAR) 5 MG tablet Take 5 mg by mouth daily. Women who are pregnant or may become pregnant should use precautions when handling this medication.  . fluticasone (FLONASE) 50 MCG/ACT nasal spray Place 2 sprays into both nostrils at bedtime.   . gabapentin (NEURONTIN) 100 MG capsule Take 100 mg by mouth daily.  Marland Kitchen levothyroxine (SYNTHROID) 50 MCG tablet Take 50 mcg by mouth daily before breakfast.  . Lidocaine (ASPERCREME LIDOCAINE) 4 % PTCH Apply 1 patch topically in the morning. Apply to mid lower back  . methocarbamol (ROBAXIN) 500 MG tablet Take 250 mg by mouth daily as needed for muscle spasms.   . Misc. Devices (SLEEPRIGHT BREATHE AID) MISC 1 strip at bedtime.  Marland Kitchen omeprazole (PRILOSEC OTC) 20 MG tablet Take 20 mg by mouth daily.  Marland Kitchen oxyCODONE HCl (OXYCONTIN PO) Take 10 mg by mouth in the morning and at bedtime.  . polyethylene glycol (MIRALAX / GLYCOLAX) packet Take 17 g by mouth 2 (two) times daily. Mix in 4-8 ounces of fluids for constipation,Hold for loose stools  . sennosides-docusate sodium (SENOKOT-S) 8.6-50 MG tablet Take 2 tablets by mouth at bedtime.  . [DISCONTINUED] oxyCODONE (OXYCONTIN) 10 mg 12 hr tablet Take 1 tablet (10 mg total) by mouth daily. At 6am. Do Not Crush   No facility-administered encounter medications on file as of 12/08/2019.    Review of Systems:  Review of Systems  Unable to perform ROS: Other    Health Maintenance  Topic Date Due  . INFLUENZA VACCINE  10/09/2019  . TETANUS/TDAP  11/01/2026  . COVID-19 Vaccine  Completed  . PNA vac Low Risk Adult  Completed    Physical Exam: Vitals:   12/08/19 1614   BP: 108/68  Pulse: (!) 59  Resp: 16  Temp: (!) 96.4 F (35.8 C)  SpO2: 94%  Weight: 124 lb (56.2 kg)  Height: 5\' 9"  (1.753 m)   Body mass index is 18.31 kg/m. Physical Exam Vitals reviewed.  Constitutional:      Appearance: Normal appearance.  HENT:     Head: Normocephalic.     Nose: Nose normal.     Mouth/Throat:     Mouth: Mucous membranes are moist.     Pharynx: Oropharynx is clear.  Eyes:     Pupils: Pupils are equal, round, and reactive to light.  Cardiovascular:     Rate and Rhythm: Normal rate and regular rhythm.     Pulses: Normal pulses.  Pulmonary:     Effort: Pulmonary effort is normal. No respiratory distress.  Breath sounds: Normal breath sounds. No wheezing.  Abdominal:     General: Abdomen is flat. Bowel sounds are normal.     Palpations: Abdomen is soft.  Musculoskeletal:        General: No swelling.     Cervical back: Neck supple.  Skin:    General: Skin is warm.  Neurological:     Mental Status: He is alert.     Comments: Looks More confused  Psychiatric:        Mood and Affect: Mood normal.        Thought Content: Thought content normal.     Labs reviewed: Basic Metabolic Panel: Recent Labs    04/28/19 0000 09/08/19 0000 09/13/19 0000  NA 138 133* 135*  K 4.2 5.7* 4.5  CL 108 102 103  CO2 22 27* 23*  BUN 31* 36* 33*  CREATININE 1.0 1.1 1.2  CALCIUM 8.5* 8.6* 8.4*  TSH 1.89  --   --    Liver Function Tests: Recent Labs    04/28/19 0000 09/08/19 0000  AST 13* 14  ALT 6* 4*  ALKPHOS 136* 108  ALBUMIN 3.4* 3.6   No results for input(s): LIPASE, AMYLASE in the last 8760 hours. No results for input(s): AMMONIA in the last 8760 hours. CBC: Recent Labs    04/28/19 0000 09/08/19 0000 10/13/19 0000  WBC 6.2 7.5 7.9  NEUTROABS 4,104 4,080 5,491  HGB 9.7* 8.9* 9.5*  HCT 30* 28* 30*  PLT 209 198 203   Lipid Panel: No results for input(s): CHOL, HDL, LDLCALC, TRIG, CHOLHDL, LDLDIRECT in the last 8760 hours. Lab Results    Component Value Date   HGBA1C 5.6 04/06/2017    Procedures since last visit: No results found.  Assessment/Plan  Dehydration /Acute renal In sufficiency D/W Nurse In charge Will hold IV fluids Or Remeron Continue PO fluids Discontinue Iron and Neurontin Hospice consult Bun 59 cret 1.3  Adult failure to thrive Hospice consult Would provide supportive care Cough Chest xray negative Whit count Normal Benign prostatic hyperplasia with urinary hesitancy Continue Proscar  Pain Control Continue Oxycodone for now  Labs/tests ordered:  * No order type specified * Next appt:  Visit date not found

## 2019-12-15 ENCOUNTER — Encounter: Payer: Self-pay | Admitting: Internal Medicine

## 2019-12-15 ENCOUNTER — Non-Acute Institutional Stay: Payer: Medicare PPO | Admitting: Internal Medicine

## 2019-12-15 DIAGNOSIS — R627 Adult failure to thrive: Secondary | ICD-10-CM | POA: Diagnosis not present

## 2019-12-15 DIAGNOSIS — E860 Dehydration: Secondary | ICD-10-CM

## 2019-12-15 DIAGNOSIS — D509 Iron deficiency anemia, unspecified: Secondary | ICD-10-CM

## 2019-12-15 DIAGNOSIS — N401 Enlarged prostate with lower urinary tract symptoms: Secondary | ICD-10-CM

## 2019-12-15 DIAGNOSIS — R3911 Hesitancy of micturition: Secondary | ICD-10-CM | POA: Diagnosis not present

## 2019-12-15 NOTE — Progress Notes (Signed)
Location:    [facility] Room Number: 32 Place of Service:  ALF [phone]) Provider:  Einar Crow MD  [name], MD  Patient Care Team: [name], MD as PCP - General (Internal Medicine) Mast, Man X, NP as Nurse Practitioner (Internal Medicine)  Extended Emergency Contact Information Primary Emergency Contact: [name], [place] Home Phone: [phone] Mobile Phone: [phone] Relation: Son Secondary Emergency Contact: [name] [place] Mobile Phone: [phone] Relation: Grandson  Code Status:  DNR Goals of care: Advanced Directive information Advanced Directives 12/08/2019  Does Patient Have a Medical Advance Directive? Yes  Type of Estate agent of [place];Living will;Out of facility DNR (pink MOST or yellow form)  Does patient want to make changes to medical advance directive? No - Patient declined  Copy of Healthcare Power of Attorney in Chart? Yes - validated most recent copy scanned in chart (See row information)  Would patient like information on creating a medical advance directive? -  Pre-existing out of facility DNR order (yellow form or pink MOST form) -     Chief Complaint  Patient presents with  . Acute Visit    End of life    HPI:  Pt is a 86 y.o. male seen today for an acute visit for End of life care  Long term Resident of AL. Patient is a resident in [facility] He has a history of AAA with measurement of 9.3 cm in CT scanin07/19,hypertension, peripheral neuropathy, BPH, hypothyroid, GERDand very hard of hearing Recent Fall withLeftdistal tibia fib fracture Plan for Conservative Management with NWB and Hard   Cast is off now. And Ortho has suggested Range of motion and activity in that leg as tolerated  But Patient is now refusing all his meds. Spitting them out. Appetite is poor. Confused.. Caregiver has noticed  some skin break down in  Hospice wants to know if all his Meds can be stopped Patient unable to give much history. He seems more confused  Past Medical History:  Diagnosis Date  . AAA (abdominal aortic aneurysm) (HCC)   . Abnormality of gait 08/01/2010  . Adhesive capsulitis of shoulder 08/01/2010   "Frozen" right shoulder  . Anemia   . Anxiety state, unspecified 08/01/2010  . Arthritis   . Congestive heart failure, unspecified 08/01/2010  . Cough 08/07/2015  . Disorders of bursae and tendons in shoulder region, unspecified 08/01/2010  . Edema 08/01/2010  . Fall   . Fecal impaction (HCC)   . GERD (gastroesophageal reflux disease)   . Hiatal hernia   . Hypercalcemia   . Hyperlipidemia   . Hypertension   . Hypertrophy of prostate without urinary obstruction and other lower urinary tract symptoms (LUTS) 08/01/2010  . Hyponatremia   . Hypothyroidism   . Infectious colitis   . Insomnia, unspecified 08/01/2010  . Joint pain   . Leg pain    with walking  . Lumbago 12/24/2010  . Orthostatic hypotension 11/05/2010  . Other specified cardiac dysrhythmias(427.89) 12/24/2010  . Pain in joint, pelvic region and thigh 08/06/2010  . Peptic ulcer, unspecified site, unspecified as acute or chronic, without mention of hemorrhage, perforation, or obstruction 08/01/2010  . Renal failure, acute (HCC)   . Swelling, mass, or lump in head and neck 01/03/2011  . Ulcer    Stomach  . Unspecified disorder of kidney and ureter 08/01/2010  . Unspecified hearing loss  08/01/2010  . Unspecified vitamin D deficiency 08/06/2010  . Urinary frequency 09/13/2010  . Vertigo    Past Surgical History:  Procedure Laterality Date  . ABDOMINAL AORTIC ANEURYSM REPAIR  12-18-10   Stent graft repair of AAA  . APPENDECTOMY    . CATARACT EXTRACTION    . HEMORRHOID SURGERY  2000  . INGUINAL HERNIA REPAIR Left 2005  . TRANSTHORACIC ECHOCARDIOGRAM  07/19/2010    Left ventricle: There is hypokinesis of the inferior wall and   posterior wall. The EF is 35-40%. The cavity size was normal    Allergies  Allergen Reactions  . Aleve [Naproxen] Other (See Comments)    Per MAR  . Indocin [Indomethacin] Other (See Comments)    Per MAR    Allergies as of 12/15/2019      Reactions   Aleve [naproxen] Other (See Comments)   Per MAR   Indocin [indomethacin] Other (See Comments)   Per [facility]      Medication List       Accurate as of December 15, 2019  4:16 PM. If you have any questions, ask your nurse or doctor.        STOP taking these medications   acetaminophen 500 MG tablet Commonly known as: TYLENOL Stopped by: [name], MD   Cholecalciferol 25 MCG (1000 UT) tablet Stopped by: [name], MD   ferrous sulfate 325 (65 FE) MG tablet Stopped by: [name], MD   finasteride 5 MG tablet Commonly known as: PROSCAR Stopped by: [name], MD   gabapentin 100 MG capsule Commonly known as: NEURONTIN Stopped by: [name], MD   levothyroxine 50 MCG tablet Commonly known as: SYNTHROID Stopped by: [name], MD   omeprazole 20 MG tablet Commonly known as: PRILOSEC OTC Stopped by: [name], MD   OXYCONTIN PO Stopped by: [name], MD   polyethylene glycol 17 g packet Commonly known as: MIRALAX / GLYCOLAX Stopped by: [name], MD   sennosides-docusate sodium 8.6-50 MG tablet Commonly known as: SENOKOT-S Stopped by: [name], MD     TAKE these medications   Aspercreme Lidocaine 4 % Ptch Generic drug: Lidocaine Apply 1 patch topically in the morning. Apply to mid lower back   bisacodyl 10 MG suppository Commonly known as: DULCOLAX Place 10 mg rectally every other day. For constipation Hold for loose stools   fluticasone 50 MCG/ACT nasal spray Commonly known as: FLONASE Place 2 sprays into both nostrils at bedtime.   LORazepam 0.5 MG tablet Commonly known as: ATIVAN Take 0.5 mg by mouth every 4 (four) hours as needed for anxiety.     LORazepam 0.5 MG tablet Commonly known as: ATIVAN Take 0.5 mg by mouth 2 (two) times daily.   methocarbamol 500 MG tablet Commonly known as: ROBAXIN Take 250 mg by mouth daily as needed for muscle spasms.   OcuSoft Eyelid Cleansing Pads Apply topically. 1 pad to each eye once a morning   ROXANOL PO Take by mouth every 4 (four) hours as needed. .11ml (20/28ml) liquid   ROXANOL PO Take by mouth. .40ml (20mg /18ml); amt: .21ml; oral Twice A Day   SleepRight Breathe Aid Misc 1 strip at bedtime.       Review of Systems  Unable to perform ROS: Mental status change    Immunization History  Administered Date(s) Administered  . Influenza, High Dose Seasonal PF 12/21/2018  . Influenza-Unspecified 12/27/2010, 12/29/2011, 12/27/2012, 12/13/2013, 12/14/2014, 12/20/2015, 12/17/2016, 12/14/2017  .  Moderna SARS-COVID-2 Vaccination 03/14/2019, 04/11/2019  . PPD Test 07/30/2010, 08/14/2010  . Pneumococcal Conjugate-13 11/04/2016  . Pneumococcal Polysaccharide-23 03/10/2005  . Td 03/11/1999  . Tdap 10/31/2016  . Zoster Recombinat (Shingrix) 11/12/2018   Pertinent  Health Maintenance Due  Topic Date Due  . INFLUENZA VACCINE  10/09/2019  . PNA vac Low Risk Adult  Completed   Fall Risk  11/04/2017 10/29/2016 08/07/2015 08/07/2015 04/10/2015  Falls in the past year? No Yes Yes No No  Comment - - 06/02/15- skin tear right arm - -  Number falls in past yr: - 1 - - -  Injury with Fall? - No - - -   Functional Status Survey:    Vitals:   12/15/19 1555  BP: 114/80  Pulse: 70  Resp: 20  Temp: (!) 95 F (35 C)  SpO2: 92%  Weight: 124 lb (56.2 kg)  Height: 5\' 9"  (1.753 m)   Body mass index is 18.31 kg/m. Physical Exam Vitals reviewed.  Constitutional:      Appearance: Normal appearance.     Comments: But is Confused  HENT:     Head: Normocephalic.     Nose: Nose normal.  Cardiovascular:     Rate and Rhythm: Normal rate and regular rhythm.     Pulses: Normal pulses.  Pulmonary:      Effort: Pulmonary effort is normal.     Breath sounds: Normal breath sounds.  Abdominal:     General: Abdomen is flat. Bowel sounds are normal.     Palpations: Abdomen is soft.  Musculoskeletal:        General: No swelling.     Cervical back: Neck supple.  Skin:    General: Skin is warm.  Neurological:     General: No focal deficit present.     Mental Status: He is alert.     Comments: Mildily Confused  Psychiatric:        Mood and Affect: Mood normal.     Labs reviewed: Recent Labs    09/08/19 0000 09/13/19 0000 11/21/19 0000  NA 133* 135* 137  K 5.7* 4.5 4.6  CL 102 103 106  CO2 27* 23* 28*  BUN 36* 33* 35*  CREATININE 1.1 1.2 1.1  CALCIUM 8.6* 8.4* 8.2*   Recent Labs    04/28/19 0000 09/08/19 0000  AST 13* 14  ALT 6* 4*  ALKPHOS 136* 108  ALBUMIN 3.4* 3.6   Recent Labs    04/28/19 0000 04/28/19 0000 09/08/19 0000 10/13/19 0000 11/21/19 0000  WBC 6.2   < > 7.5 7.9 6.2  NEUTROABS 4,104  --  4,080 5,491  --   HGB 9.7*   < > 8.9* 9.5* 9.6*  HCT 30*   < > 28* 30* 30*  PLT 209   < > 198 203 167   < > = values in this interval not displayed.   Lab Results  Component Value Date   TSH 1.89 04/28/2019   Lab Results  Component Value Date   HGBA1C 5.6 04/06/2017   Lab Results  Component Value Date   CHOL 145 04/06/2017   HDL 47 04/06/2017   LDLCALC 89 04/06/2017   TRIG 47 04/06/2017   CHOLHDL 3.1 04/06/2017    Significant Diagnostic Results in last 30 days:  No results found.  Assessment/Plan Patient with h/o Hypertension, AAA BPH and Tibial Fracture Now with Failure to thrive . Refusing all meds All Po Meds Discontinued and Patient started on Roxanol and Ativan. Family is  agreeable. D/W Hospice Nurse Family wants him to stay in [place] with Caregivers   Family/ staff Communication:   Labs/tests ordered:

## 2019-12-28 DIAGNOSIS — R2681 Unsteadiness on feet: Secondary | ICD-10-CM | POA: Diagnosis not present

## 2019-12-28 DIAGNOSIS — Z9181 History of falling: Secondary | ICD-10-CM | POA: Diagnosis not present

## 2019-12-28 DIAGNOSIS — I10 Essential (primary) hypertension: Secondary | ICD-10-CM | POA: Diagnosis not present

## 2019-12-28 DIAGNOSIS — M6281 Muscle weakness (generalized): Secondary | ICD-10-CM | POA: Diagnosis not present

## 2020-01-09 DEATH — deceased
